# Patient Record
Sex: Female | Born: 1940 | Race: White | Hispanic: No | Marital: Married | Smoking: Former smoker
Health system: Southern US, Community
[De-identification: ages and names within clinical notes are randomized; demographics above are authoritative.]

## PROBLEM LIST (undated history)

## (undated) DIAGNOSIS — E46 Unspecified protein-calorie malnutrition: Secondary | ICD-10-CM

## (undated) DIAGNOSIS — K5790 Diverticulosis of intestine, part unspecified, without perforation or abscess without bleeding: Secondary | ICD-10-CM

## (undated) DIAGNOSIS — I219 Acute myocardial infarction, unspecified: Secondary | ICD-10-CM

## (undated) DIAGNOSIS — G459 Transient cerebral ischemic attack, unspecified: Secondary | ICD-10-CM

## (undated) DIAGNOSIS — G43909 Migraine, unspecified, not intractable, without status migrainosus: Secondary | ICD-10-CM

## (undated) DIAGNOSIS — F419 Anxiety disorder, unspecified: Secondary | ICD-10-CM

## (undated) DIAGNOSIS — I779 Disorder of arteries and arterioles, unspecified: Secondary | ICD-10-CM

## (undated) DIAGNOSIS — K08109 Complete loss of teeth, unspecified cause, unspecified class: Secondary | ICD-10-CM

## (undated) DIAGNOSIS — M797 Fibromyalgia: Secondary | ICD-10-CM

## (undated) DIAGNOSIS — R918 Other nonspecific abnormal finding of lung field: Secondary | ICD-10-CM

## (undated) DIAGNOSIS — J45909 Unspecified asthma, uncomplicated: Secondary | ICD-10-CM

## (undated) DIAGNOSIS — I509 Heart failure, unspecified: Secondary | ICD-10-CM

## (undated) DIAGNOSIS — I82409 Acute embolism and thrombosis of unspecified deep veins of unspecified lower extremity: Secondary | ICD-10-CM

## (undated) DIAGNOSIS — I440 Atrioventricular block, first degree: Secondary | ICD-10-CM

## (undated) DIAGNOSIS — F418 Other specified anxiety disorders: Secondary | ICD-10-CM

## (undated) DIAGNOSIS — K5900 Constipation, unspecified: Secondary | ICD-10-CM

## (undated) DIAGNOSIS — I639 Cerebral infarction, unspecified: Secondary | ICD-10-CM

## (undated) DIAGNOSIS — Z9981 Dependence on supplemental oxygen: Secondary | ICD-10-CM

## (undated) DIAGNOSIS — I739 Peripheral vascular disease, unspecified: Secondary | ICD-10-CM

## (undated) DIAGNOSIS — Z972 Presence of dental prosthetic device (complete) (partial): Secondary | ICD-10-CM

## (undated) DIAGNOSIS — J449 Chronic obstructive pulmonary disease, unspecified: Secondary | ICD-10-CM

## (undated) HISTORY — DX: Fibromyalgia: M79.7

## (undated) HISTORY — DX: Other specified anxiety disorders: F41.8

## (undated) HISTORY — PX: TONSILLECTOMY: SUR1361

## (undated) HISTORY — DX: Constipation, unspecified: K59.00

## (undated) HISTORY — PX: EYE SURGERY: SHX253

## (undated) HISTORY — PX: BACK SURGERY: SHX140

## (undated) HISTORY — DX: Cerebral infarction, unspecified: I63.9

## (undated) HISTORY — DX: Anxiety disorder, unspecified: F41.9

## (undated) HISTORY — DX: Acute embolism and thrombosis of unspecified deep veins of unspecified lower extremity: I82.409

## (undated) HISTORY — DX: Chronic obstructive pulmonary disease, unspecified: J44.9

## (undated) HISTORY — PX: OTHER SURGICAL HISTORY: SHX169

## (undated) HISTORY — PX: APPENDECTOMY: SHX54

---

## 1969-09-01 HISTORY — PX: ABDOMINAL HYSTERECTOMY: SHX81

## 2001-01-30 HISTORY — PX: COLONOSCOPY: SHX174

## 2012-06-11 ENCOUNTER — Encounter (HOSPITAL_COMMUNITY): Payer: Self-pay | Admitting: *Deleted

## 2012-06-11 ENCOUNTER — Emergency Department (HOSPITAL_COMMUNITY)
Admission: EM | Admit: 2012-06-11 | Discharge: 2012-06-11 | Disposition: A | Discharge status: Home / Self Care | Payer: Medicare Other | Attending: Emergency Medicine | Admitting: Emergency Medicine

## 2012-06-11 DIAGNOSIS — M25579 Pain in unspecified ankle and joints of unspecified foot: Secondary | ICD-10-CM | POA: Insufficient documentation

## 2012-06-11 DIAGNOSIS — Z888 Allergy status to other drugs, medicaments and biological substances status: Secondary | ICD-10-CM | POA: Insufficient documentation

## 2012-06-11 DIAGNOSIS — M79673 Pain in unspecified foot: Secondary | ICD-10-CM

## 2012-06-11 DIAGNOSIS — F17200 Nicotine dependence, unspecified, uncomplicated: Secondary | ICD-10-CM | POA: Insufficient documentation

## 2012-06-11 MED ORDER — HYDROCODONE-ACETAMINOPHEN 5-325 MG PO TABS
2.0000 | ORAL_TABLET | Freq: Once | ORAL | Status: AC
  Administered 2012-06-11: 2 via ORAL
  Filled 2012-06-11: qty 2

## 2012-06-11 MED ORDER — ONDANSETRON HCL 4 MG PO TABS
4.0000 mg | ORAL_TABLET | Freq: Once | ORAL | Status: AC
  Administered 2012-06-11: 4 mg via ORAL
  Filled 2012-06-11: qty 1

## 2012-06-11 MED ORDER — DEXAMETHASONE 6 MG PO TABS: ORAL_TABLET | ORAL | Status: DC

## 2012-06-11 MED ORDER — DEXAMETHASONE SODIUM PHOSPHATE 4 MG/ML IJ SOLN
8.0000 mg | Freq: Once | INTRAMUSCULAR | Status: AC
  Administered 2012-06-11: 8 mg via INTRAMUSCULAR
  Filled 2012-06-11: qty 2

## 2012-06-11 MED ORDER — DOXYCYCLINE HYCLATE 100 MG PO TABS
100.0000 mg | ORAL_TABLET | Freq: Once | ORAL | Status: AC
  Administered 2012-06-11: 100 mg via ORAL
  Filled 2012-06-11: qty 1

## 2012-06-11 MED ORDER — DOXYCYCLINE HYCLATE 100 MG PO CAPS: 100.0000 mg | ORAL_CAPSULE | Freq: Two times a day (BID) | ORAL | Status: DC

## 2012-06-11 NOTE — ED Notes (Signed)
Pt alert & oriented x4. Patient given discharge instructions, paperwork & prescription(s). Patient instructed to stop at the registration desk to finish any additional paperwork. Patient verbalized understanding. Pt left department w/ no further questions. 

## 2012-06-11 NOTE — ED Provider Notes (Signed)
History     CSN: [id number]  Arrival date & time 06/11/12  Rickey Primus   First MD Initiated Contact with Patient 06/11/12 1904      Chief Complaint  Patient presents with  . Ankle Pain    (Consider location/radiation/quality/duration/timing/severity/associated sxs/prior treatment) HPI Comments: Patient states she was working in her basement on last ev and put her feet on the floor, she noted severe pain in the ening. States she did not notice anything abnormal she was wearing shoes. This morning when she put her feet on the floor she noted pain in the right heel area. She then noticed a area of increased redness present. And has been concerned during the day as to whether or not she may have a spider bite. The patient has not had any fever. There is no red streaking that she has noted of the red area of the right heel. There's been no previous injury. She has no problem moving her ankle up and down with the exception of back of it causes pain. She's not had any pain of the toes. And she has not had this problem in the past.  The history is provided by the patient.    History reviewed. No pertinent past medical history.  Past Surgical History  Procedure Date  . Abdominal hysterectomy     History reviewed. No pertinent family history.  History  Substance Use Topics  . Smoking status: Current Every Day Smoker  . Smokeless tobacco: Not on file  . Alcohol Use: No    OB History    Grav Para Term Preterm Abortions TAB SAB Ect Mult Living                  Review of Systems  Constitutional: Negative for activity change.       All ROS Neg except as noted in HPI  HENT: Negative for nosebleeds and neck pain.   Eyes: Negative for photophobia and discharge.  Respiratory: Negative for cough, shortness of breath and wheezing.   Cardiovascular: Negative for chest pain and palpitations.  Gastrointestinal: Negative for abdominal pain and blood in stool.  Genitourinary: Negative for dysuria,  frequency and hematuria.  Musculoskeletal: Negative for back pain and arthralgias.  Skin: Negative.   Neurological: Negative for dizziness, seizures and speech difficulty.  Psychiatric/Behavioral: Negative for hallucinations and confusion.    Allergies  Codeine and Talwin  Home Medications   Current Outpatient Rx  Name Route Sig Dispense Refill  . DEXAMETHASONE 6 MG PO TABS  1 po daily with food 5 tablet 0  . DOXYCYCLINE HYCLATE 100 MG PO CAPS Oral Take 1 capsule (100 mg total) by mouth 2 (two) times daily. 14 capsule 0    BP 141/69  Pulse 84  Temp 97.8 F (36.6 C) (Oral)  Resp 20  Ht 5' 3.5" (1.613 m)  Wt 103 lb (46.72 kg)  BMI 17.96 kg/m2  SpO2 95%  Physical Exam  Nursing note and vitals reviewed. Constitutional: She is oriented to person, place, and time. She appears well-developed and well-nourished.  Non-toxic appearance.  HENT:  Head: Normocephalic.  Right Ear: Tympanic membrane and external ear normal.  Left Ear: Tympanic membrane and external ear normal.  Eyes: EOM and lids are normal. Pupils are equal, round, and reactive to light.  Neck: Normal range of motion. Neck supple. Carotid bruit is not present.  Cardiovascular: Normal rate, regular rhythm, normal heart sounds, intact distal pulses and normal pulses.   Pulmonary/Chest: No respiratory distress.  Soft wheezes present.  Abdominal: Soft. Bowel sounds are normal. There is no tenderness. There is no guarding.  Musculoskeletal: Normal range of motion.       There is pain and slight increased redness of the lateral posterior right ankle extending into the heel. There is also a small area of bruising just in front of the red area. The Achilles tendon is intact. The dorsalis pedis pulses 2+ on the right. There no puncture wounds noted at the red area of the heel under magnification. There no lesions in between the toes. There is no puncture wounds to the plantar surface. The right foot is not hot. There is full  range of motion of the right knee and hip.  Lymphadenopathy:       Head (right side): No submandibular adenopathy present.       Head (left side): No submandibular adenopathy present.    She has no cervical adenopathy.  Neurological: She is alert and oriented to person, place, and time. She has normal strength. No cranial nerve deficit or sensory deficit.  Skin: Skin is warm and dry.  Psychiatric: She has a normal mood and affect. Her speech is normal.    ED Course  Procedures (including critical care time)  Labs Reviewed - No data to display No results found. Pulse oximetry 95% on room air. Within normal limits by my interpretation.  1. Foot pain       MDM  I have reviewed nursing notes, vital signs, and all appropriate lab and imaging results for this patient. Patient seen with me by Dr. Lynelle Doctor.  The patient has a red tender area at the posterior portion of the right ankle. I do not see puncture marks nor evidence of stinger or body parts of an insect in the area. The area is not hot. The Achilles tendon is intact. Question ruptured varicose vein versus occult strain versus early cellulitis. The plan at this time is for the patient to receive Decadron 6 mg daily. The patient has hydrocodone at home that she will use for pain. The patient will apply ice and elevate the ankle. We'll also cover with doxycycline 100 mg 2 times daily for possible infection or early cellulitis. The patient is encouraged to return to the emergency department or see her primary physician if not improving.       Kathie Dike, [place] 06/11/12 2013

## 2012-06-11 NOTE — ED Notes (Addendum)
Pain medial aspect rt ankle, thinks was bitten by a spider. Sl redness present.painful wt bearing.lNo known injury

## 2012-06-11 NOTE — ED Provider Notes (Signed)
Patient has a mild diffuse swollen area inferior to her medial malleolus of her right ankle with a lot of varicosities in the area and minimal redness. There is no warmth to the area. The area is very tender to touch. There is no obvious insect bite areas seen.  Medical screening examination/treatment/procedure(s) were conducted as a shared visit with non-physician practitioner(s) and myself.  I personally evaluated the patient during the encounter Devoria Albe, MD, Franz Dell, MD 06/11/12 2008

## 2012-06-12 NOTE — ED Provider Notes (Signed)
See prior note   Ward Givens, MD 06/12/12 [phone]

## 2012-10-01 ENCOUNTER — Other Ambulatory Visit: Payer: Self-pay | Admitting: Family Medicine

## 2012-10-01 DIAGNOSIS — N644 Mastodynia: Secondary | ICD-10-CM

## 2012-10-07 ENCOUNTER — Telehealth: Payer: Self-pay | Admitting: *Deleted

## 2012-10-07 NOTE — Telephone Encounter (Signed)
Lindsay Mitchell called today to be triaged for a colonoscopy. Please call her back. Thank you.

## 2012-10-07 NOTE — Telephone Encounter (Signed)
I called pt to triage for colonoscopy. She had her last attempt in 2002 elsewhere. Many problems with prep and procedure. Said she had a difficult colon. OV with Gerrit Halls, NP on 10/28/2012 at 2:00 PM.

## 2012-10-19 ENCOUNTER — Other Ambulatory Visit (HOSPITAL_COMMUNITY): Payer: Self-pay | Admitting: Family Medicine

## 2012-10-19 DIAGNOSIS — R1013 Epigastric pain: Secondary | ICD-10-CM

## 2012-10-20 ENCOUNTER — Other Ambulatory Visit: Payer: Self-pay | Admitting: Family Medicine

## 2012-10-20 ENCOUNTER — Ambulatory Visit (HOSPITAL_COMMUNITY)
Admission: RE | Admit: 2012-10-20 | Discharge: 2012-10-20 | Disposition: A | Discharge status: Home / Self Care | Payer: Medicare Other | Source: Ambulatory Visit | Attending: Family Medicine | Admitting: Family Medicine

## 2012-10-20 DIAGNOSIS — R634 Abnormal weight loss: Secondary | ICD-10-CM | POA: Insufficient documentation

## 2012-10-20 DIAGNOSIS — R1013 Epigastric pain: Secondary | ICD-10-CM | POA: Insufficient documentation

## 2012-10-20 DIAGNOSIS — Q619 Cystic kidney disease, unspecified: Secondary | ICD-10-CM | POA: Insufficient documentation

## 2012-10-20 DIAGNOSIS — N644 Mastodynia: Secondary | ICD-10-CM

## 2012-10-20 DIAGNOSIS — R921 Mammographic calcification found on diagnostic imaging of breast: Secondary | ICD-10-CM

## 2012-10-20 MED ORDER — IOHEXOL 300 MG/ML  SOLN
100.0000 mL | Freq: Once | INTRAMUSCULAR | Status: AC | PRN
  Administered 2012-10-20: 100 mL via INTRAVENOUS

## 2012-10-27 ENCOUNTER — Ambulatory Visit
Admission: RE | Admit: 2012-10-27 | Discharge: 2012-10-27 | Disposition: A | Discharge status: Home / Self Care | Payer: Medicare Other | Source: Ambulatory Visit | Attending: Family Medicine | Admitting: Family Medicine

## 2012-10-27 ENCOUNTER — Other Ambulatory Visit: Payer: Self-pay | Admitting: Family Medicine

## 2012-10-27 DIAGNOSIS — C50912 Malignant neoplasm of unspecified site of left female breast: Secondary | ICD-10-CM

## 2012-10-27 DIAGNOSIS — R921 Mammographic calcification found on diagnostic imaging of breast: Secondary | ICD-10-CM

## 2012-10-28 ENCOUNTER — Encounter (HOSPITAL_COMMUNITY): Payer: Self-pay | Admitting: *Deleted

## 2012-10-28 ENCOUNTER — Encounter: Payer: Self-pay | Admitting: Gastroenterology

## 2012-10-28 ENCOUNTER — Ambulatory Visit (INDEPENDENT_AMBULATORY_CARE_PROVIDER_SITE_OTHER): Payer: Medicare Other | Admitting: Gastroenterology

## 2012-10-28 VITALS — BP 127/65 | HR 69 | Temp 97.0°F | Ht 63.0 in | Wt 102.4 lb

## 2012-10-28 DIAGNOSIS — R110 Nausea: Secondary | ICD-10-CM | POA: Insufficient documentation

## 2012-10-28 DIAGNOSIS — R109 Unspecified abdominal pain: Secondary | ICD-10-CM | POA: Insufficient documentation

## 2012-10-28 DIAGNOSIS — K5900 Constipation, unspecified: Secondary | ICD-10-CM | POA: Insufficient documentation

## 2012-10-28 DIAGNOSIS — R634 Abnormal weight loss: Secondary | ICD-10-CM

## 2012-10-28 MED ORDER — FLEET ENEMA 7-19 GM/118ML RE ENEM
1.0000 | ENEMA | Freq: Once | RECTAL | Status: DC
Start: 2012-10-28 — End: 2012-10-29

## 2012-10-28 MED ORDER — LUBIPROSTONE 24 MCG PO CAPS: 24.0000 ug | ORAL_CAPSULE | Freq: Two times a day (BID) | ORAL | Status: DC

## 2012-10-28 MED ORDER — PEG 3350-KCL-NA BICARB-NACL 420 G PO SOLR: 4000.0000 mL | ORAL | Status: DC

## 2012-10-28 NOTE — Patient Instructions (Addendum)
Start taking Amitiza (a constipation medication) once a day WITH FOOD to avoid nausea. Increase this to twice a day if you do well with this.  We have set you up for a colonoscopy and upper endoscopy with Dr. Darrick Penna in the near future.

## 2012-10-28 NOTE — ED Notes (Addendum)
Normal bm 3 days ago.  To have colonoscopy,March 11.  abd pain, nausea.  Pt seen by GI doctor today.  Pt thinks she is impacted.

## 2012-10-28 NOTE — Progress Notes (Signed)
Primary Care Physician:  Redmond Baseman, MD Primary Gastroenterologist:  Dr. Darrick Penna   Chief Complaint  Patient presents with  . Colonoscopy    HPI:   Ms. Lindsay Mitchell is a pleasant 72 year old female who presents today for an updated colonoscopy. Apparently, her last lower GI evaluation was in 2002 by Dr. Noe Gens at [facility]. Apparently, she had severe diverticular disease with haustal hypertrophy, persistent spasticity and early stenosis. Doesn't want the colonoscopy to be completed "with any force". History of chronic constipation, with BM usually every other day, sometimes every 3 days. Stool softener. Weighed in the 130s about 7-8 months ago, now right at 100 lbs. Lower abdominal discomfort when "bowels don't move". States chronically tender. +lack of appetite, intermittent nausea a few weeks ago. +stress. No GERD. No dysphagia. Scant hematochezia with straining. No FH of colon cancer.    CT Feb 2014 without evidence for occult malignancy, diverticulitis. Recent CBC, TSH, HFP all normal.  Past Medical History  Diagnosis Date  . Constipation   . Depression with anxiety     Past Surgical History  Procedure Laterality Date  . Abdominal hysterectomy    . Back surgery      X3  . Colonoscopy  June 2002    Dr. Noe Gens: severe diverticular disease with haustral hypertrophy, primary and secondary diverticulosis, persistent spasticity, early stenosis    Current Outpatient Prescriptions  Medication Sig Dispense Refill  . clonazePAM (KLONOPIN) 1 MG tablet Take 1 mg by mouth 2 (two) times daily as needed for anxiety.      Marland Kitchen HYDROcodone-acetaminophen (NORCO/VICODIN) 5-325 MG per tablet Take 1 tablet by mouth every 6 (six) hours as needed for pain.      Marland Kitchen dexamethasone (DECADRON) 6 MG tablet 1 po daily with food  5 tablet  0  . doxycycline (VIBRAMYCIN) 100 MG capsule Take 1 capsule (100 mg total) by mouth 2 (two) times daily.  14 capsule  0  . lubiprostone (AMITIZA) 24 MCG  capsule Take 1 capsule (24 mcg total) by mouth 2 (two) times daily with a meal.  60 capsule  3  . polyethylene glycol-electrolytes (TRILYTE) 420 G solution Take 4,000 mLs by mouth as directed.  4000 mL  0   No current facility-administered medications for this visit.    Allergies as of 10/28/2012 - Review Complete 10/28/2012  Allergen Reaction Noted  . Codeine  06/11/2012  . Talwin (pentazocine)  06/11/2012    Family History  Problem Relation Age of Onset  . Colon cancer Neg Hx     History   Social History  . Marital Status: Divorced    Spouse Name: N/A    Number of Children: 4  . Years of Education: N/A   Occupational History  . Not on file.   Social History Main Topics  . Smoking status: Former Smoker    Quit date: 09/01/2012  . Smokeless tobacco: Not on file     Comment: smoked X 60 years  . Alcohol Use: No  . Drug Use: No  . Sexually Active: Yes    Birth Control/ Protection: Surgical   Other Topics Concern  . Not on file   Social History Narrative  . No narrative on file    Review of Systems: Gen: SEE HPI CV: Denies chest pain, heart palpitations, peripheral edema, syncope.  Resp: Denies shortness of breath at rest or with exertion. Denies wheezing or cough.  GI: SEE HPI  GU : Denies urinary burning, urinary frequency, urinary hesitancy MS: +arthritis  Derm: Denies rash, itching, dry skin Psych: Denies depression, anxiety, memory loss, and confusion Heme: Denies bruising, bleeding, and enlarged lymph nodes.  Physical Exam: BP 127/65  Pulse 69  Temp(Src) 97 F (36.1 C) (Oral)  Ht 5\' 3"  (1.6 m)  Wt 102 lb 6.4 oz (46.448 kg)  BMI 18.14 kg/m2 General:   Alert and oriented. Pleasant and cooperative. Thin but not cachectic. Head:  Normocephalic and atraumatic. Eyes:  Without icterus, sclera clear and conjunctiva pink.  Ears:  Normal auditory acuity. Nose:  No deformity, discharge,  or lesions. Mouth:  No deformity or lesions, oral mucosa pink.  Neck:   Supple, without mass or thyromegaly. Lungs:  Clear to auscultation bilaterally. No wheezes, rales, or rhonchi. No distress.  Heart:  S1, S2 present without murmurs appreciated.  Abdomen:  +BS, soft, mildly tender to palpation lower abdomen and non-distended. No HSM noted. No guarding or rebound. No masses appreciated.  Rectal:  Deferred  Msk:  Symmetrical without gross deformities. Normal posture. Extremities:  Without clubbing or edema. Neurologic:  Alert and  oriented x4;  grossly normal neurologically. Skin:  Intact without significant lesions or rashes. Cervical Nodes:  No significant cervical adenopathy. Psych:  Alert and cooperative. Normal mood and affect.

## 2012-10-28 NOTE — Assessment & Plan Note (Signed)
Approximately 30 lbs lost in last half of the year. Pt admits to stress; however, she also notes early satiety and intermittent nausea. Unable to exclude gastritis, PUD, occult biliary component. CT on file negative for malignancy. Wt loss may be secondary to anxiety.   Proceed with upper endoscopy at time of TCS in the near future with Dr. Darrick Penna. The risks, benefits, and alternatives have been discussed in detail with patient. They have stated understanding and desire to proceed.  PROPOFOL

## 2012-10-28 NOTE — Assessment & Plan Note (Addendum)
72 year old female with chronic constipation, on stool softeners prn. However, she notes lower abdominal discomfort that is chronic as well, with a negative CT on file from February of this year. 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 Scant hematochezia noted in past. Last colonoscopy in 2002 at [facility], which appears to have been quite difficult due to significant diverticular disease, spasticity, early stenosis. She is quite concerned regarding a colonoscopy and worried about perforation. I assured her that force would not be used and extra precautions taken. She will be done with Propofol due to her concern for failed sedation.  Proceed with colonoscopy with Dr. Darrick Penna in the near future. The risks, benefits, and alternatives have been discussed in detail with the patient. They state understanding and desire to proceed.  PROPOFOL for sedation Begin Amitiza 24 mcg daily, increase to BID

## 2012-10-28 NOTE — Progress Notes (Signed)
Faxed to PCP

## 2012-10-29 ENCOUNTER — Emergency Department (HOSPITAL_COMMUNITY)
Admission: EM | Admit: 2012-10-29 | Discharge: 2012-10-29 | Discharge status: Against Medical Advice | Payer: Medicare Other | Attending: Emergency Medicine | Admitting: Emergency Medicine

## 2012-10-29 HISTORY — DX: Diverticulosis of intestine, part unspecified, without perforation or abscess without bleeding: K57.90

## 2012-10-29 NOTE — ED Notes (Signed)
Per registration pt left after triage.

## 2012-11-02 ENCOUNTER — Encounter (HOSPITAL_COMMUNITY): Payer: Self-pay | Admitting: Pharmacy Technician

## 2012-11-02 NOTE — Patient Instructions (Addendum)
Pihu Basil  11/02/2012   Your procedure is scheduled on:  11/09/2012  Report to [facility] at  615  AM.  Call this number if you have problems the morning of surgery: [phone]   Remember:   Do not eat food or drink liquids after midnight.   Take these medicines the morning of surgery with A SIP OF WATER: decadron,klonopin,norco   Do not wear jewelry, make-up or nail polish.  Do not wear lotions, powders, or perfumes.   Do not shave 48 hours prior to surgery. Men may shave face and neck.  Do not bring valuables to the hospital.  Contacts, dentures or bridgework may not be worn into surgery.  Leave suitcase in the car. After surgery it may be brought to your room.  For patients admitted to the hospital, checkout time is 11:00 AM the day of discharge.   Patients discharged the day of surgery will not be allowed to drive  home.  Name and phone number of your driver: family  Special Instructions: Shower using CHG 2 nights before surgery and the night before surgery.  If you shower the day of surgery use CHG.  Use special wash - you have one bottle of CHG for all showers.  You should use approximately 1/3 of the bottle for each shower.   Please read over the following fact sheets that you were given: Pain Booklet, Coughing and Deep Breathing, MRSA Information, Surgical Site Infection Prevention, Anesthesia Post-op Instructions and Care and Recovery After Surgery Colonoscopy A colonoscopy is an exam to evaluate your entire colon. In this exam, your colon is cleansed. A long fiberoptic tube is inserted through your rectum and into your colon. The fiberoptic scope (endoscope) is a long bundle of enclosed and very flexible fibers. These fibers transmit light to the area examined and send images from that area to your caregiver. Discomfort is usually minimal. You may be given a drug to help you sleep (sedative) during or prior to the procedure. This exam helps to detect lumps (tumors),  polyps, inflammation, and areas of bleeding. Your caregiver may also take a small piece of tissue (biopsy) that will be examined under a microscope. LET YOUR CAREGIVER KNOW ABOUT:   Allergies to food or medicine.  Medicines taken, including vitamins, herbs, eyedrops, over-the-counter medicines, and creams.  Use of steroids (by mouth or creams).  Previous problems with anesthetics or numbing medicines.  History of bleeding problems or blood clots.  Previous surgery.  Other health problems, including diabetes and kidney problems.  Possibility of pregnancy, if this applies. BEFORE THE PROCEDURE   A clear liquid diet may be required for 2 days before the exam.  Ask your caregiver about changing or stopping your regular medications.  Liquid injections (enemas) or laxatives may be required.  A large amount of electrolyte solution may be given to you to drink over a short period of time. This solution is used to clean out your colon.  You should be present 60 minutes prior to your procedure or as directed by your caregiver. AFTER THE PROCEDURE   If you received a sedative or pain relieving medication, you will need to arrange for someone to drive you home.  Occasionally, there is a little blood passed with the first bowel movement. Do not be concerned. FINDING OUT THE RESULTS OF YOUR TEST Not all test results are available during your visit. If your test results are not back during the visit, make an appointment with  your caregiver to find out the results. Do not assume everything is normal if you have not heard from your caregiver or the medical facility. It is important for you to follow up on all of your test results. HOME CARE INSTRUCTIONS   It is not unusual to pass moderate amounts of gas and experience mild abdominal cramping following the procedure. This is due to air being used to inflate your colon during the exam. Walking or a warm pack on your belly (abdomen) may  help.  You may resume all normal meals and activities after sedatives and medicines have worn off.  Only take over-the-counter or prescription medicines for pain, discomfort, or fever as directed by your caregiver. Do not use aspirin or blood thinners if a biopsy was taken. Consult your caregiver for medicine usage if biopsies were taken. SEEK IMMEDIATE MEDICAL CARE IF:   You have a fever.  You pass large blood clots or fill a toilet with blood following the procedure. This may also occur 10 to 14 days following the procedure. This is more likely if a biopsy was taken.  You develop abdominal pain that keeps getting worse and cannot be relieved with medicine. Document Released: 08/15/2000 Document Revised: 11/10/2011 Document Reviewed: 03/30/2008 [facility] Patient Information 2013 [place], [place]. PATIENT INSTRUCTIONS POST-ANESTHESIA  IMMEDIATELY FOLLOWING SURGERY:  Do not drive or operate machinery for the first twenty four hours after surgery.  Do not make any important decisions for twenty four hours after surgery or while taking narcotic pain medications or sedatives.  If you develop intractable nausea and vomiting or a severe headache please notify your doctor immediately.  FOLLOW-UP:  Please make an appointment with your surgeon as instructed. You do not need to follow up with anesthesia unless specifically instructed to do so.  WOUND CARE INSTRUCTIONS (if applicable):  Keep a dry clean dressing on the anesthesia/puncture wound site if there is drainage.  Once the wound has quit draining you may leave it open to air.  Generally you should leave the bandage intact for twenty four hours unless there is drainage.  If the epidural site drains for more than 36-48 hours please call the anesthesia department.  QUESTIONS?:  Please feel free to call your physician or the hospital operator if you have any questions, and they will be happy to assist you.

## 2012-11-03 ENCOUNTER — Other Ambulatory Visit: Payer: Self-pay | Admitting: Family Medicine

## 2012-11-03 ENCOUNTER — Telehealth: Payer: Self-pay | Admitting: Gastroenterology

## 2012-11-03 ENCOUNTER — Encounter (HOSPITAL_COMMUNITY)
Admission: RE | Admit: 2012-11-03 | Discharge: 2012-11-03 | Disposition: A | Discharge status: Home / Self Care | Payer: Medicare Other | Source: Ambulatory Visit

## 2012-11-03 DIAGNOSIS — R4182 Altered mental status, unspecified: Secondary | ICD-10-CM

## 2012-11-03 NOTE — Telephone Encounter (Signed)
Kim called from Endo and stated that Lindsay Mitchell no showed for her Pre-Op Appointment and I called and LMOM for patient to return my call to get her R/S

## 2012-11-04 NOTE — Telephone Encounter (Signed)
Lindsay Mitchell called and stated that she has had a stroke and they are doing some testing on her brain for possible blood clot and she is going to put off her TCS right now due to more emergent medical problems and she will call back at a later date to R/S her TCS

## 2012-11-04 NOTE — Telephone Encounter (Signed)
Agree. That is definitely appropriate. Thanks for the info.

## 2012-11-05 ENCOUNTER — Inpatient Hospital Stay (HOSPITAL_COMMUNITY): Admission: RE | Admit: 2012-11-05 | Payer: Medicare Other | Source: Ambulatory Visit

## 2012-11-05 ENCOUNTER — Ambulatory Visit (HOSPITAL_COMMUNITY): Payer: Medicare Other

## 2012-11-05 ENCOUNTER — Ambulatory Visit (HOSPITAL_COMMUNITY): Admission: RE | Admit: 2012-11-05 | Payer: Medicare Other | Source: Ambulatory Visit

## 2012-11-08 ENCOUNTER — Ambulatory Visit (HOSPITAL_COMMUNITY)
Admission: RE | Admit: 2012-11-08 | Discharge: 2012-11-08 | Disposition: A | Discharge status: Home / Self Care | Payer: Medicare Other | Source: Ambulatory Visit | Attending: Family Medicine | Admitting: Family Medicine

## 2012-11-08 ENCOUNTER — Other Ambulatory Visit (HOSPITAL_COMMUNITY): Payer: Medicare Other

## 2012-11-08 DIAGNOSIS — R4182 Altered mental status, unspecified: Secondary | ICD-10-CM

## 2012-11-08 DIAGNOSIS — I6529 Occlusion and stenosis of unspecified carotid artery: Secondary | ICD-10-CM | POA: Insufficient documentation

## 2012-11-09 ENCOUNTER — Ambulatory Visit: Admit: 2012-11-09 | Payer: Medicare Other | Admitting: Gastroenterology

## 2012-11-09 ENCOUNTER — Other Ambulatory Visit (HOSPITAL_COMMUNITY): Payer: Medicare Other

## 2012-11-09 SURGERY — COLONOSCOPY WITH PROPOFOL
Anesthesia: Monitor Anesthesia Care

## 2012-11-12 ENCOUNTER — Encounter: Payer: Self-pay | Admitting: Gastroenterology

## 2012-11-15 ENCOUNTER — Other Ambulatory Visit: Payer: Self-pay

## 2012-11-15 ENCOUNTER — Encounter: Payer: Self-pay | Admitting: Vascular Surgery

## 2012-11-24 ENCOUNTER — Other Ambulatory Visit: Payer: Self-pay | Admitting: *Deleted

## 2012-12-09 ENCOUNTER — Encounter: Payer: Self-pay | Admitting: Vascular Surgery

## 2012-12-09 ENCOUNTER — Ambulatory Visit: Payer: Self-pay | Admitting: Family Medicine

## 2012-12-10 ENCOUNTER — Ambulatory Visit (INDEPENDENT_AMBULATORY_CARE_PROVIDER_SITE_OTHER): Payer: Medicare Other | Admitting: Vascular Surgery

## 2012-12-10 ENCOUNTER — Encounter: Payer: Medicare Other | Admitting: Vascular Surgery

## 2012-12-10 ENCOUNTER — Encounter: Payer: Self-pay | Admitting: Vascular Surgery

## 2012-12-10 ENCOUNTER — Other Ambulatory Visit (INDEPENDENT_AMBULATORY_CARE_PROVIDER_SITE_OTHER): Payer: Medicare Other | Admitting: *Deleted

## 2012-12-10 ENCOUNTER — Other Ambulatory Visit: Payer: Medicare Other

## 2012-12-10 VITALS — BP 121/70 | HR 75 | Ht 63.0 in | Wt 105.3 lb

## 2012-12-10 DIAGNOSIS — I6522 Occlusion and stenosis of left carotid artery: Secondary | ICD-10-CM

## 2012-12-10 DIAGNOSIS — I6529 Occlusion and stenosis of unspecified carotid artery: Secondary | ICD-10-CM

## 2012-12-10 NOTE — Progress Notes (Signed)
[facility]  New Carotid Patient  Referred by:  Ileana Ladd, MD [address] [place], [place] [address]  Reason for referral: L carotid stenosis  History of Present Illness  Lindsay Mitchell is a 72 y.o. (1940/12/31) female who presents with chief complaint: possible L neck blockage.  Previous carotid studies demonstrated: RICA minimal stenosis, LICA >70% stenosis.  Pt recently had an episode where she would noted by her husban to be slurring her speech.  The patient went to bed and it resolved by the time she woke.  She is amnestic of the entire event.  The patient was sent to ER for CVA work-up.  Head MRI was negative.  Carotid studies were as noted above.  The patient has never had amaurosis fugax or monocular blindness.  The patient has never had facial drooping or hemiplegia.  The patient has had an episode of expressive aphasia.   The patient's previous neurologic deficits have resolved.  The patient's risks factors for carotid disease include: former smoker.  Past Medical History  Diagnosis Date  . Constipation   . Depression with anxiety   . Diverticulosis   . DVT (deep venous thrombosis)   . COPD (chronic obstructive pulmonary disease)   . Carotid artery occlusion     Past Surgical History  Procedure Laterality Date  . Abdominal hysterectomy    . Back surgery      X3  . Colonoscopy  June 2002    Dr. Noe Gens: severe diverticular disease with haustral hypertrophy, primary and secondary diverticulosis, persistent spasticity, early stenosis    History   Social History  . Marital Status: Divorced    Spouse Name: N/A    Number of Children: 4  . Years of Education: N/A   Occupational History  . Not on file.   Social History Main Topics  . Smoking status: Former Smoker    Quit date: 06/11/2012  . Smokeless tobacco: Not on file     Comment: smoked X 60 years  . Alcohol Use: No  . Drug Use: No  . Sexually Active: Yes    Birth  Control/ Protection: Surgical   Other Topics Concern  . Not on file   Social History Narrative  . No narrative on file    Family History  Problem Relation Age of Onset  . Colon cancer Neg Hx   . Cancer Mother   . Heart disease Mother   . Hypertension Mother   . Heart attack Mother   . Other Mother     varicose veins  . Diabetes Brother   . Hypertension Brother     Current Outpatient Prescriptions on File Prior to Visit  Medication Sig Dispense Refill  . clonazePAM (KLONOPIN) 1 MG tablet Take 1 mg by mouth 2 (two) times daily as needed for anxiety.      Marland Kitchen dexamethasone (DECADRON) 6 MG tablet 1 po daily with food  5 tablet  0  . doxycycline (VIBRAMYCIN) 100 MG capsule Take 1 capsule (100 mg total) by mouth 2 (two) times daily.  14 capsule  0  . HYDROcodone-acetaminophen (NORCO/VICODIN) 5-325 MG per tablet Take 1 tablet by mouth every 6 (six) hours as needed for pain.      Marland Kitchen lubiprostone (AMITIZA) 24 MCG capsule Take 1 capsule (24 mcg total) by mouth 2 (two) times daily with a meal.  60 capsule  3  . polyethylene glycol-electrolytes (TRILYTE) 420 G solution Take 4,000 mLs by mouth as directed.  4000 mL  0   No current facility-administered medications on file prior to visit.    Allergies  Allergen Reactions  . Codeine   . Talwin (Pentazocine)   . Valium (Diazepam)   . Vicodin (Hydrocodone-Acetaminophen)     REVIEW OF SYSTEMS:  (Positives checked otherwise negative)  CARDIOVASCULAR:  []  chest pain, []  chest pressure, []  palpitations, []  shortness of breath when laying flat, [x]  shortness of breath with exertion,  [x]  pain in feet when walking, [x]  pain in feet when laying flat, []  history of blood clot in veins (DVT), []  history of phlebitis, []  swelling in legs, [x]  varicose veins  PULMONARY:  []  productive cough, []  asthma, []  wheezing  NEUROLOGIC:  []  weakness in arms or legs, []  numbness in arms or legs, []  difficulty speaking or slurred speech, []  temporary loss of  vision in one eye, []  dizziness  HEMATOLOGIC:  []  bleeding problems, [x]  problems with blood clotting too easily  MUSCULOSKEL:  []  joint pain, []  joint swelling  GASTROINTEST:  []  vomiting blood, []  blood in stool     GENITOURINARY:  []  burning with urination, []  blood in urine  PSYCHIATRIC:  []  history of major depression  INTEGUMENTARY:  []  rashes, []  ulcers  CONSTITUTIONAL:  []  fever, []  chills  PRE-ADM LIVING: []  Home, []  Nursing home, []  Homeless  AMB STATUS: []  Walking, []  Walking w/ Assistance, []  Wheelchair, [] Bed ridden  For [employer] Use RECENT HEART ATTACK (<6 mon): No  CAD Sx: [x]  No, []  Asymptomatic, h/o heart attack, []  Stable angina, []  Unstable angina  PRIOR CHF: [x]  No, []  Asymptomatic , []  Mild, []  Moderate, []  Severe  STRESS TEST: [x]  No, []  Normal, []  + ischemia, []  + Heart attack, []  Both  Physical Examination  Filed Vitals:   12/10/12 1333 12/10/12 1335  BP: 133/72 121/70  Pulse: 75   Height: 5\' 3"  (1.6 m)   Weight: 105 lb 4.8 oz (47.764 kg)   SpO2: 98%     Body mass index is 18.66 kg/(m^2).  General: A&O x 3, WD, thin  Head: Deport/AT  Ear/Nose/Throat: Hearing grossly intact, nares w/o erythema or drainage, oropharynx w/o Erythema/Exudate, Mallampati score: 2  Eyes: PERRLA, EOMI  Neck: Supple, no nuchal rigidity, no palpable LAD  Pulmonary: Sym exp, good air movt, CTAB, no rales, rhonchi, & wheezing  Cardiac: RRR, Nl S1, S2, no Murmurs, rubs or gallops  Vascular: Vessel Right Left  Radial Palpable Palpable  Ulnar Palpable Palpable  Brachial Palpable Palpable  Carotid Palpable, without bruit Palpable, without bruit  Aorta Not palpable N/A  Femoral Palpable Palpable  Popliteal Not palpable Not palpable  PT  Palpable  Palpable  DP  Palpable  Palpable   Gastrointestinal: soft, NTND, -G/R, - HSM, - masses, - CVAT B  Musculoskeletal: M/S 5/5 throughout , Extremities without ischemic changes   Neurologic: CN 2-12 intact , Pain and light  touch intact in extremities , Motor exam as listed above  Psychiatric: Judgment intact, Mood & affect appropriate for pt's clinical situation  Dermatologic: See M/S exam for extremity exam, no rashes otherwise noted  Lymph : No Cervical, Axillary, or Inguinal lymphadenopathy   Non-Invasive Vascular Imaging  L CAROTID DUPLEX (Date: 12/10/12):   L ICA stenosis: 50-69%  L VA: patent and antegrade  Outside Studies/Documentation 4 pages of outside documents were reviewed including: outside MRI report and outside carotid duplex.  Medical Decision Making  Niemah Schwebke is a 72 y.o. female who presents with: likely asx L ICA stenosis  50-69%   Based on the patient's vascular studies and examination, I have offered the patient: refer to Neurology.  I am not absolutely convinced that this patient had a CVA and the head MRI doesn't demonstrate any infarcted tissue.  It is possible she had a TIA, but TIA don't cause amnesia.  I discussed in depth with the patient the nature of atherosclerosis, and emphasized the importance of maximal medical management including strict control of blood pressure, blood glucose, and lipid levels, obtaining regular exercise, antiplatelet agents, and cessation of smoking.    The patient is currently not on an antiplatelet.  I asked her to take ASA 81 mg PO daily.  The patient is currently not on a statin.  The patient prefers follow up with her PCP in regards starting an statin.  The patient is aware that without maximal medical management the underlying atherosclerotic disease process will progress, limiting the benefit of any interventions.  The patient will follow up with [place] after Neurology evaluation.  If they feel she had a TIA, [facility] would support proceeding with a L CEA.  Thank you for allowing [place] to participate in this patient's care.  Leonides Sake, MD [facility] Office: [phone] Pager: [phone]  12/10/2012,  4:42 PM

## 2012-12-20 ENCOUNTER — Telehealth: Payer: Self-pay | Admitting: Vascular Surgery

## 2012-12-20 NOTE — Telephone Encounter (Signed)
Dr Imogene Burn would like for this patient to be evaluated by a neurologist ASAP for possible TIA/CVA symptoms. Originally Dr Imogene Burn had asked that we find a neurologist in [place] for the patients convenience. I referred Lindsay Mitchell to Dr Gerilyn Pilgrim in [place].   Lindsay Mitchell called on 04/16 to let me know that she had decided against seeing Dr Gerilyn Pilgrim. She said that Dr Imogene Burn had mentioned in their conversation possibly referring her to [facility] or [facility]. She has decided that she trusts Dr Imogene Burn enough to make the trip to see a neurologist that he recommends. I have since referred her to [facility] Neurology.  Their office will contact her with an appointment. I have cancelled her referral to [facility] and she is aware that [facility] will contact her with an appointment, dpm

## 2012-12-29 ENCOUNTER — Telehealth: Payer: Self-pay | Admitting: Vascular Surgery

## 2012-12-29 NOTE — Telephone Encounter (Signed)
Spoke with patient regarding referral made to [facility] Neurology. Patient is aware of her appointment on 01/13/13 @ 9:00am.

## 2013-01-06 ENCOUNTER — Telehealth: Payer: Self-pay | Admitting: Family Medicine

## 2013-01-06 NOTE — Telephone Encounter (Signed)
Needs office visit. We do have a diagnosis of COPD but no record of treating her with these medicines.

## 2013-01-07 NOTE — Telephone Encounter (Signed)
Spoke with pt needs refill on inhalers  appt Monday and advised to bring all meds

## 2013-01-10 ENCOUNTER — Encounter: Payer: Self-pay | Admitting: Family Medicine

## 2013-01-10 ENCOUNTER — Ambulatory Visit (INDEPENDENT_AMBULATORY_CARE_PROVIDER_SITE_OTHER): Payer: Medicare Other | Admitting: Family Medicine

## 2013-01-10 VITALS — BP 105/67 | HR 67 | Temp 97.0°F | Wt 104.2 lb

## 2013-01-10 DIAGNOSIS — I6529 Occlusion and stenosis of unspecified carotid artery: Secondary | ICD-10-CM

## 2013-01-10 DIAGNOSIS — M549 Dorsalgia, unspecified: Secondary | ICD-10-CM

## 2013-01-10 DIAGNOSIS — J441 Chronic obstructive pulmonary disease with (acute) exacerbation: Secondary | ICD-10-CM

## 2013-01-10 MED ORDER — HYDROCODONE-ACETAMINOPHEN 5-325 MG PO TABS: 1.0000 | ORAL_TABLET | Freq: Three times a day (TID) | ORAL | Status: DC | PRN

## 2013-01-10 MED ORDER — BECLOMETHASONE DIPROPIONATE 40 MCG/ACT IN AERS: 2.0000 | INHALATION_SPRAY | Freq: Two times a day (BID) | RESPIRATORY_TRACT | Status: DC

## 2013-01-10 MED ORDER — MIRTAZAPINE 15 MG PO TABS: 15.0000 mg | ORAL_TABLET | Freq: Every day | ORAL | Status: DC

## 2013-01-10 MED ORDER — IPRATROPIUM-ALBUTEROL 0.5-2.5 (3) MG/3ML IN SOLN: 3.0000 mL | Freq: Four times a day (QID) | RESPIRATORY_TRACT | Status: DC | PRN

## 2013-01-10 NOTE — Progress Notes (Signed)
Patient ID: Lindsay Mitchell, female   DOB: 12-18-1940, 72 y.o.   MRN: [id number] SUBJECTIVE: HPI: Came for follow up.Also, needs refills. Has COPD.stable at present. Not had an exacerbation. has had shortness of breath has not had wheezing has had a response to medications Medications used:spiriva and duoneb. has not had swelling of the feet has not had chest pain has not had exposure tobacco smoke or other triggers. has had a flu shot has not had a pneumonia shot has not had fever has not had purulent phlegm has not had blood in the sputum, has had weight loss.but regained after extensive work up.  has not had any recent medication changes.    PMH/PSH: reviewed/updated in Epic  SH/FH: reviewed/updated in Epic  Allergies: reviewed/updated in Epic  Medications: reviewed/updated in Epic  Immunizations: reviewed/updated in Epic  ROS: As above in the HPI. All other systems are stable or negative.  OBJECTIVE: APPEARANCE:  Patient in no acute distress.The patient appeared well nourished and normally developed. Acyanotic. Waist: VITAL SIGNS:BP 105/67  Pulse 67  Temp(Src) 97 F (36.1 C) (Oral)  Wt 104 lb 3.2 oz (47.265 kg)  BMI 18.46 kg/m2 WF slim built.  SKIN: warm and  Dry without overt rashes, tattoos and scars  HEAD and Neck: without JVD, Head and scalp: normal Eyes:No scleral icterus. Fundi normal, eye movements normal. Ears: Auricle normal, canal normal, Tympanic membranes normal, insufflation normal. Nose: normal Throat: normal Neck & thyroid: normal  CHEST & LUNGS: Chest wall: normal Lungs: Coarse breath sounds.  CVS: Reveals the PMI to be normally located. Regular rhythm, First and Second Heart sounds are normal,  absence of murmurs, rubs or gallops. Peripheral vasculature: Radial pulses: normal Dorsal pedis pulses: reduced Posterior pedis pulses:reduced Carotids:bruits+  ABDOMEN:  Appearance: normal Benign,, no organomegaly, no masses, no  Abdominal Aortic enlargement. No Guarding , no rebound. No Bruits. Bowel sounds: normal  RECTAL: N/A GU: N/A  EXTREMETIES: nonedematous. Pedal pulses is reduced.  MUSCULOSKELETAL:  Spine: decreased ROM and pain   NEUROLOGIC: oriented to time,place and person; nonfocal. Strength is normal  ASSESSMENT: Back pain - Plan: HYDROcodone-acetaminophen (NORCO/VICODIN) 5-325 MG per tablet, beclomethasone (QVAR) 40 MCG/ACT inhaler  Carotid stenosis, unspecified laterality - Plan: COMPLETE METABOLIC PANEL WITH GFR, NMR Lipoprofile with Lipids  COPD exacerbation - Plan: ipratropium-albuterol (DUONEB) 0.5-2.5 (3) MG/3ML SOLN   .PLAN:  Orders Placed This Encounter  Procedures  . COMPLETE METABOLIC PANEL WITH GFR  . NMR Lipoprofile with Lipids   No results found for this or any previous visit. Meds ordered this encounter  Medications  . DISCONTD: ipratropium-albuterol (DUONEB) 0.5-2.5 (3) MG/3ML SOLN    Sig: Take 3 mLs by nebulization every 4 (four) hours as needed.  Marland Kitchen DISCONTD: tiotropium (SPIRIVA) 18 MCG inhalation capsule    Sig: Place 18 mcg into inhaler and inhale daily.  Marland Kitchen HYDROcodone-acetaminophen (NORCO/VICODIN) 5-325 MG per tablet    Sig: Take 1 tablet by mouth every 8 (eight) hours as needed for pain.    Dispense:  30 tablet    Refill:  0  . ipratropium-albuterol (DUONEB) 0.5-2.5 (3) MG/3ML SOLN    Sig: Take 3 mLs by nebulization every 6 (six) hours as needed.    Dispense:  360 mL    Refill:  3  . beclomethasone (QVAR) 40 MCG/ACT inhaler    Sig: Inhale 2 puffs into the lungs 2 (two) times daily.    Dispense:  1 Inhaler    Refill:  5  . mirtazapine (REMERON) 15 MG tablet  Sig: Take 1 tablet (15 mg total) by mouth daily.    Dispense:  30 tablet    Refill:  5   Discussed risks and  Benefits of medications. Probably needs to be on a statin. meds adjusted.  RTc 3 months.  Malacki Mcphearson P. Modesto Charon, M.D.

## 2013-01-11 LAB — COMPLETE METABOLIC PANEL WITH GFR
ALT: 8 U/L (ref 0–35)
AST: 16 U/L (ref 0–37)
Albumin: 4.2 g/dL (ref 3.5–5.2)
Alkaline Phosphatase: 89 U/L (ref 39–117)
BUN: 31 mg/dL — ABNORMAL HIGH (ref 6–23)
CO2: 28 mEq/L (ref 19–32)
Calcium: 9.6 mg/dL (ref 8.4–10.5)
Chloride: 104 mEq/L (ref 96–112)
Creat: 1.05 mg/dL (ref 0.50–1.10)
GFR, Est African American: 63 mL/min
GFR, Est Non African American: 54 mL/min — ABNORMAL LOW
Glucose, Bld: 91 mg/dL (ref 70–99)
Potassium: 4.1 mEq/L (ref 3.5–5.3)
Sodium: 142 mEq/L (ref 135–145)
Total Bilirubin: 0.3 mg/dL (ref 0.3–1.2)
Total Protein: 6.3 g/dL (ref 6.0–8.3)

## 2013-01-13 ENCOUNTER — Other Ambulatory Visit: Payer: Self-pay | Admitting: Family Medicine

## 2013-01-13 DIAGNOSIS — E785 Hyperlipidemia, unspecified: Secondary | ICD-10-CM

## 2013-01-13 LAB — NMR LIPOPROFILE WITH LIPIDS
Cholesterol, Total: 192 mg/dL (ref <200)
HDL Particle Number: 35.9 umol/L (ref >=30.5)
HDL Size: 9.6 nm (ref >=9.2)
HDL-C: 55 mg/dL (ref >=40)
LDL (calc): 114 mg/dL — ABNORMAL HIGH (ref <100)
LDL Particle Number: 1390 nmol/L — ABNORMAL HIGH (ref <1000)
LDL Size: 21.2 nm (ref >20.5)
LP-IR Score: 27 (ref <=45)
Large HDL-P: 8.7 umol/L (ref >=4.8)
Large VLDL-P: 2.8 nmol/L — ABNORMAL HIGH (ref <=2.7)
Small LDL Particle Number: 416 nmol/L (ref <=527)
Triglycerides: 116 mg/dL (ref <150)
VLDL Size: 42.3 nm (ref <=46.6)

## 2013-01-13 MED ORDER — ATORVASTATIN CALCIUM 10 MG PO TABS: 10.0000 mg | ORAL_TABLET | Freq: Every day | ORAL | Status: DC

## 2013-02-19 NOTE — Progress Notes (Signed)
REVIEWED.  Lindsay Mitchell had a stroke. CANCELLED TCS MAR 2014. Will call PT TO SE IF SHE IS READY FOR HER TCS. IF NOT, OPV IN SEP 2014 E30 NAUSEA/WEIGHT LOSS/LWR ABD PAIN.

## 2013-02-21 ENCOUNTER — Encounter: Payer: Self-pay | Admitting: Gastroenterology

## 2013-02-21 NOTE — Progress Notes (Signed)
Pt is aware of OV on 8/27 at 10am with SF and appt card was mailed

## 2013-04-12 ENCOUNTER — Ambulatory Visit (INDEPENDENT_AMBULATORY_CARE_PROVIDER_SITE_OTHER): Payer: Medicare Other | Admitting: Family Medicine

## 2013-04-12 ENCOUNTER — Encounter: Payer: Self-pay | Admitting: Family Medicine

## 2013-04-12 VITALS — BP 107/64 | HR 84 | Temp 98.2°F | Wt 107.6 lb

## 2013-04-12 DIAGNOSIS — G8929 Other chronic pain: Secondary | ICD-10-CM | POA: Insufficient documentation

## 2013-04-12 DIAGNOSIS — J441 Chronic obstructive pulmonary disease with (acute) exacerbation: Secondary | ICD-10-CM

## 2013-04-12 DIAGNOSIS — G589 Mononeuropathy, unspecified: Secondary | ICD-10-CM

## 2013-04-12 DIAGNOSIS — F4320 Adjustment disorder, unspecified: Secondary | ICD-10-CM

## 2013-04-12 DIAGNOSIS — J449 Chronic obstructive pulmonary disease, unspecified: Secondary | ICD-10-CM | POA: Insufficient documentation

## 2013-04-12 DIAGNOSIS — E785 Hyperlipidemia, unspecified: Secondary | ICD-10-CM

## 2013-04-12 DIAGNOSIS — G629 Polyneuropathy, unspecified: Secondary | ICD-10-CM

## 2013-04-12 DIAGNOSIS — M549 Dorsalgia, unspecified: Secondary | ICD-10-CM

## 2013-04-12 DIAGNOSIS — I6529 Occlusion and stenosis of unspecified carotid artery: Secondary | ICD-10-CM

## 2013-04-12 MED ORDER — IPRATROPIUM-ALBUTEROL 0.5-2.5 (3) MG/3ML IN SOLN: 3.0000 mL | Freq: Four times a day (QID) | RESPIRATORY_TRACT | Status: DC | PRN

## 2013-04-12 MED ORDER — GABAPENTIN 300 MG PO CAPS: 300.0000 mg | ORAL_CAPSULE | Freq: Three times a day (TID) | ORAL | Status: DC

## 2013-04-12 NOTE — Progress Notes (Signed)
Patient ID: Lindsay Mitchell, female   DOB: 1941/08/29, 72 y.o.   MRN: [id number] SUBJECTIVE: CC: Chief Complaint  Patient presents with  . Follow-up    3 month follow up     HPI: Patient is here for follow up of hyperlipidemia: denies Headache;denies Chest Pain;denies weakness; Has Shortness of Breath from her COPD . No orthopnea;denies Visual changes;denies palpitations;denies cough;denies pedal edema;denies symptoms of TIA or stroke;deniesClaudication symptoms. admits to Compliance with medications; denies Problems with medications.   Here for follow up of her COPD , adjustment reaction and chronic back pain. She has been stable and holding well. The medications have kept her well.  Past Medical History  Diagnosis Date  . Constipation   . Depression with anxiety   . Diverticulosis   . DVT (deep venous thrombosis)   . COPD (chronic obstructive pulmonary disease)   . Carotid artery occlusion    Past Surgical History  Procedure Laterality Date  . Abdominal hysterectomy    . Back surgery      X3  . Colonoscopy  June 2002    Dr. Noe Gens: severe diverticular disease with haustral hypertrophy, primary and secondary diverticulosis, persistent spasticity, early stenosis   History   Social History  . Marital Status: Divorced    Spouse Name: N/A    Number of Children: 4  . Years of Education: N/A   Occupational History  . Not on file.   Social History Main Topics  . Smoking status: Former Smoker    Quit date: 06/11/2012  . Smokeless tobacco: Not on file     Comment: smoked X 60 years  . Alcohol Use: No  . Drug Use: No  . Sexually Active: Yes    Birth Control/ Protection: Surgical   Other Topics Concern  . Not on file   Social History Narrative  . No narrative on file   Family History  Problem Relation Age of Onset  . Colon cancer Neg Hx   . Cancer Mother   . Heart disease Mother   . Hypertension Mother   . Heart attack Mother   . Other Mother     varicose  veins  . Diabetes Brother   . Hypertension Brother    Current Outpatient Prescriptions on File Prior to Visit  Medication Sig Dispense Refill  . lubiprostone (AMITIZA) 24 MCG capsule Take 1 capsule (24 mcg total) by mouth 2 (two) times daily with a meal.  60 capsule  3  . aspirin 325 MG tablet Take 325 mg by mouth daily.      Marland Kitchen atorvastatin (LIPITOR) 10 MG tablet Take 1 tablet (10 mg total) by mouth daily.  30 tablet  3  . beclomethasone (QVAR) 40 MCG/ACT inhaler Inhale 2 puffs into the lungs 2 (two) times daily.  1 Inhaler  5  . mirtazapine (REMERON) 15 MG tablet Take 1 tablet (15 mg total) by mouth daily.  30 tablet  5   No current facility-administered medications on file prior to visit.   Allergies  Allergen Reactions  . Codeine   . Talwin (Pentazocine)   . Valium (Diazepam)   . Vicodin (Hydrocodone-Acetaminophen)     There is no immunization history on file for this patient. Prior to Admission medications   Medication Sig Start Date End Date Taking? Authorizing Provider  gabapentin (NEURONTIN) 300 MG capsule Take 1 capsule (300 mg total) by mouth 3 (three) times daily. 04/12/13  Yes Ileana Ladd, MD  ipratropium-albuterol (DUONEB) 0.5-2.5 (3) MG/3ML SOLN Take 3  mLs by nebulization every 6 (six) hours as needed. 04/12/13  Yes Ileana Ladd, MD  lubiprostone (AMITIZA) 24 MCG capsule Take 1 capsule (24 mcg total) by mouth 2 (two) times daily with a meal. 10/28/12  Yes Nira Retort, NP  aspirin 325 MG tablet Take 325 mg by mouth daily.    Historical Provider, MD  atorvastatin (LIPITOR) 10 MG tablet Take 1 tablet (10 mg total) by mouth daily. 01/13/13   Ileana Ladd, MD  beclomethasone (QVAR) 40 MCG/ACT inhaler Inhale 2 puffs into the lungs 2 (two) times daily. 01/10/13   Ileana Ladd, MD  mirtazapine (REMERON) 15 MG tablet Take 1 tablet (15 mg total) by mouth daily. 01/10/13   Ileana Ladd, MD     ROS: As above in the HPI. All other systems are stable or  negative.  OBJECTIVE: APPEARANCE:  Patient in no acute distress.The patient appeared well nourished and normally developed. Acyanotic. Waist: VITAL SIGNS:BP 107/64  Pulse 84  Temp(Src) 98.2 F (36.8 C) (Oral)  Wt 107 lb 9.6 oz (48.807 kg)  BMI 19.07 kg/m2 WF  SKIN: warm and  Dry without overt rashes, tattoos and scars  HEAD and Neck: without JVD, Head and scalp: normal Eyes:No scleral icterus. Fundi normal, eye movements normal. Ears: Auricle normal, canal normal, Tympanic membranes normal, insufflation normal. Nose: normal Throat: normal Neck & thyroid: normal  CHEST & LUNGS: Chest wall: normal Lungs: Coarse breath sounds and scattered prolonged expiratory phase.  CVS: Reveals the PMI to be normally located. Regular rhythm, First and Second Heart sounds are normal,  absence of murmurs, rubs or gallops. Peripheral vasculature: Radial pulses: normal Dorsal pedis pulses: normal Posterior pulses: normal  ABDOMEN:  Appearance: normal Benign, no organomegaly, no masses, no Abdominal Aortic enlargement. No Guarding , no rebound. No Bruits. Bowel sounds: normal  RECTAL: N/A GU: N/A  EXTREMETIES: nonedematous. Both Femoral and Pedal pulses are normal.  MUSCULOSKELETAL:  Spine:Reduced ROM. Joints: arthritic changes of the knees.  NEUROLOGIC: oriented to time,place and person; nonfocal. Strength is normal Sensory is normal Reflexes are normal Cranial Nerves are normal.   Results for orders placed in visit on 01/10/13  COMPLETE METABOLIC PANEL WITH GFR      Result Value Range   Sodium 142  135 - 145 mEq/L   Potassium 4.1  3.5 - 5.3 mEq/L   Chloride 104  96 - 112 mEq/L   CO2 28  19 - 32 mEq/L   Glucose, Bld 91  70 - 99 mg/dL   BUN 31 (*) 6 - 23 mg/dL   Creat 4.09  8.11 - 9.14 mg/dL   Total Bilirubin 0.3  0.3 - 1.2 mg/dL   Alkaline Phosphatase 89  39 - 117 U/L   AST 16  0 - 37 U/L   ALT 8  0 - 35 U/L   Total Protein 6.3  6.0 - 8.3 g/dL   Albumin 4.2  3.5  - 5.2 g/dL   Calcium 9.6  8.4 - 78.2 mg/dL   GFR, Est African American 63     GFR, Est Non African American 54 (*)   NMR LIPOPROFILE WITH LIPIDS      Result Value Range   LDL Particle Number 1390 (*) <1000 nmol/L   LDL (calc) 114 (*) <100 mg/dL   HDL-C 55  >=95 mg/dL   Triglycerides 621  <308 mg/dL   Cholesterol, Total 657  <200 mg/dL   HDL Particle Number 84.6  >=96.2 umol/L   Large  HDL-P 8.7  >=4.8 umol/L   Large VLDL-P 2.8 (*) <=2.7 nmol/L   Small LDL Particle Number 416  <=527 nmol/L   LDL Size 21.2  >20.5 nm   HDL Size 9.6  >=9.2 nm   VLDL Size 42.3  <=46.6 nm   LP-IR Score 27  <=45    ASSESSMENT: COPD exacerbation - Plan: ipratropium-albuterol (DUONEB) 0.5-2.5 (3) MG/3ML SOLN  HLD (hyperlipidemia) - Plan: CMP14+EGFR, NMR, lipoprofile  Neuropathy - Plan: gabapentin (NEURONTIN) 300 MG capsule  Chronic back pain  COPD (chronic obstructive pulmonary disease)  Carotid stenosis, unspecified laterality  Adjustment reaction  PLAN: Orders Placed This Encounter  Procedures  . CMP14+EGFR  . NMR, lipoprofile   Meds ordered this encounter  Medications  . DISCONTD: gabapentin (NEURONTIN) 300 MG capsule    Sig: Take 300 mg by mouth 2 (two) times daily.   Marland Kitchen ipratropium-albuterol (DUONEB) 0.5-2.5 (3) MG/3ML SOLN    Sig: Take 3 mLs by nebulization every 6 (six) hours as needed.    Dispense:  360 mL    Refill:  3  . gabapentin (NEURONTIN) 300 MG capsule    Sig: Take 1 capsule (300 mg total) by mouth 3 (three) times daily.    Dispense:  90 capsule    Refill:  5    Medications Discontinued During This Encounter  Medication Reason  . amoxicillin (AMOXIL) 875 MG tablet Completed Course  . amoxicillin-clarithromycin-lansoprazole (PREVPAC) combo pack Completed Course  . dexamethasone (DECADRON) 6 MG tablet Completed Course  . clonazePAM (KLONOPIN) 1 MG tablet Completed Course  . doxycycline (VIBRAMYCIN) 100 MG capsule Completed Course  . HYDROcodone-acetaminophen  (NORCO/VICODIN) 5-325 MG per tablet Completed Course  . polyethylene glycol-electrolytes (TRILYTE) 420 G solution Completed Course  . ipratropium-albuterol (DUONEB) 0.5-2.5 (3) MG/3ML SOLN Reorder  . gabapentin (NEURONTIN) 300 MG capsule Reorder   Reviewed previous labs. Reviewed medications.  Return in about 3 months (around 07/13/2013) for Recheck medical problems.  Petronella Shuford P. [place] Charon, M.D.

## 2013-04-13 LAB — CMP14+EGFR
ALT: 6 IU/L (ref 0–32)
AST: 19 IU/L (ref 0–40)
Albumin/Globulin Ratio: 2.3 (ref 1.1–2.5)
Albumin: 4.4 g/dL (ref 3.6–4.8)
Alkaline Phosphatase: 96 IU/L (ref 39–117)
BUN/Creatinine Ratio: 36 — ABNORMAL HIGH (ref 11–26)
BUN: 30 mg/dL — ABNORMAL HIGH (ref 8–27)
CO2: 27 mmol/L (ref 18–29)
Calcium: 9.6 mg/dL (ref 8.6–10.2)
Chloride: 102 mmol/L (ref 97–108)
Creatinine, Ser: 0.83 mg/dL (ref 0.57–1.00)
GFR calc Af Amer: 83 mL/min/{1.73_m2} (ref >59)
GFR calc non Af Amer: 72 mL/min/{1.73_m2} (ref >59)
Globulin, Total: 1.9 g/dL (ref 1.5–4.5)
Glucose: 70 mg/dL (ref 65–99)
Potassium: 4.5 mmol/L (ref 3.5–5.2)
Sodium: 144 mmol/L (ref 134–144)
Total Bilirubin: 0.2 mg/dL (ref 0.0–1.2)
Total Protein: 6.3 g/dL (ref 6.0–8.5)

## 2013-04-14 LAB — NMR, LIPOPROFILE
Cholesterol: 163 mg/dL (ref <200)
HDL Cholesterol by NMR: 61 mg/dL (ref >=40)
HDL Particle Number: 43.4 umol/L (ref >=30.5)
LDL Particle Number: 921 nmol/L (ref <1000)
LDL Size: 20.5 nm — ABNORMAL LOW (ref >20.5)
LDLC SERPL CALC-MCNC: 73 mg/dL (ref <100)
LP-IR Score: <25 (ref <=45)
Small LDL Particle Number: 453 nmol/L (ref <=527)
Triglycerides by NMR: 145 mg/dL (ref <150)

## 2013-04-14 NOTE — Progress Notes (Signed)
Quick Note:  Lab result at goal. No change in Medications for now. No Change in plans and follow up. ______ 

## 2013-04-15 ENCOUNTER — Telehealth: Payer: Self-pay

## 2013-04-15 ENCOUNTER — Telehealth: Payer: Self-pay | Admitting: Family Medicine

## 2013-04-15 NOTE — Telephone Encounter (Signed)
No fever  Stuffy nose cant breathe from nose Cough  Left ear pain  Gums hurt   Per dr Modesto Charon  Needs office visit on sat   Pt aware and will be seen in am

## 2013-04-15 NOTE — Telephone Encounter (Signed)
Pt notified and lab results given  

## 2013-04-16 ENCOUNTER — Ambulatory Visit (INDEPENDENT_AMBULATORY_CARE_PROVIDER_SITE_OTHER): Payer: Medicare Other | Admitting: Nurse Practitioner

## 2013-04-16 VITALS — BP 135/72 | HR 65 | Temp 96.9°F | Ht 63.0 in | Wt 108.0 lb

## 2013-04-16 DIAGNOSIS — J019 Acute sinusitis, unspecified: Secondary | ICD-10-CM

## 2013-04-16 MED ORDER — AMOXICILLIN 875 MG PO TABS: 875.0000 mg | ORAL_TABLET | Freq: Two times a day (BID) | ORAL | Status: DC

## 2013-04-16 NOTE — Progress Notes (Signed)
  Subjective:    Patient ID: Lindsay Mitchell, female    DOB: 11/02/1940, 72 y.o.   MRN: [id number]  HPI Patient in this morning c/o sinus congecstion that started aboout 2 weks ago- Has ot gotten any better. Her teeth have started to hurt in the last 2 days. No fever. No cough    Review of Systems  Constitutional: Positive for fatigue. Negative for fever.  HENT: Positive for ear pain (left), congestion, rhinorrhea, mouth sores and sinus pressure.   Respiratory: Negative.  Negative for cough.   Cardiovascular: Negative.        Objective:   Physical Exam  Constitutional: She is oriented to person, place, and time. She appears well-developed and well-nourished.  HENT:  Right Ear: Hearing, tympanic membrane, external ear and ear canal normal.  Left Ear: Hearing, tympanic membrane, external ear and ear canal normal.  Nose: Mucosal edema and rhinorrhea present. Right sinus exhibits no maxillary sinus tenderness and no frontal sinus tenderness. Left sinus exhibits no maxillary sinus tenderness and no frontal sinus tenderness.  Mouth/Throat: Mucous membranes are normal. Posterior oropharyngeal erythema present.  Cardiovascular: Normal rate and normal heart sounds.   Pulmonary/Chest: Effort normal and breath sounds normal.  Neurological: She is alert and oriented to person, place, and time.  Skin: Skin is warm.    BP 135/72  Pulse 65  Temp(Src) 96.9 F (36.1 C) (Oral)  Ht 5\' 3"  (1.6 m)  Wt 108 lb (48.988 kg)  BMI 19.14 kg/m2       Assessment & Plan:  1. Acute rhinosinusitis 1. Take meds as prescribed 2. Use a cool mist humidifier especially during the winter months and when heat has  been humid. 3. Use saline nose sprays frequently 4. Saline irrigations of the nose can be very helpful if done frequently.  * 4X daily for 1 week*  * Use of a nettie pot can be helpful with this. Follow directions with this* 5. Drink plenty of fluids 6. Keep thermostat turn down low 7.For any  cough or congestion  Use plain Norel Aad as rx 8. For fever or aces or pains- take tylenol or ibuprofen appropriate for age and weight.  * for fevers greater than 101 orally you may alternate ibuprofen and tylenol every  3 hours.    *Norel AD samples- 1 po Q6 prn #8 - amoxicillin (AMOXIL) 875 MG tablet; Take 1 tablet (875 mg total) by mouth 2 (two) times daily.  Dispense: 20 tablet; Refill: 0  Mary-Margaret Daphine Deutscher, FNP

## 2013-04-16 NOTE — Patient Instructions (Signed)
1. Take meds as prescribed 2. Use a cool mist humidifier especially during the winter months and when heat has  been humid. 3. Use saline nose sprays frequently 4. Saline irrigations of the nose can be very helpful if done frequently.  * 4X daily for 1 week*  * Use of a nettie pot can be helpful with this. Follow directions with this* 5. Drink plenty of fluids 6. Keep thermostat turn down low 7.For any cough or congestion  Use NOrel AD as rx 8. For fever or aces or pains- take tylenol or ibuprofen appropriate for age and weight.  * for fevers greater than 101 orally you may alternate ibuprofen and tylenol every  3 hours.

## 2013-04-27 ENCOUNTER — Ambulatory Visit: Payer: Medicare Other | Admitting: Gastroenterology

## 2013-05-09 ENCOUNTER — Other Ambulatory Visit: Payer: Self-pay | Admitting: *Deleted

## 2013-05-09 DIAGNOSIS — E785 Hyperlipidemia, unspecified: Secondary | ICD-10-CM

## 2013-05-09 MED ORDER — ATORVASTATIN CALCIUM 10 MG PO TABS: 10.0000 mg | ORAL_TABLET | Freq: Every day | ORAL | Status: DC

## 2013-05-10 ENCOUNTER — Encounter: Payer: Self-pay | Admitting: Family Medicine

## 2013-05-10 ENCOUNTER — Telehealth: Payer: Self-pay | Admitting: Family Medicine

## 2013-05-10 ENCOUNTER — Ambulatory Visit (HOSPITAL_COMMUNITY)
Admission: RE | Admit: 2013-05-10 | Discharge: 2013-05-10 | Disposition: A | Discharge status: Home / Self Care | Payer: Medicare Other | Source: Ambulatory Visit | Attending: Family Medicine | Admitting: Family Medicine

## 2013-05-10 ENCOUNTER — Ambulatory Visit (INDEPENDENT_AMBULATORY_CARE_PROVIDER_SITE_OTHER): Payer: Medicare Other | Admitting: Family Medicine

## 2013-05-10 VITALS — BP 134/77 | HR 71 | Temp 97.5°F | Ht 63.0 in | Wt 108.0 lb

## 2013-05-10 DIAGNOSIS — R51 Headache: Secondary | ICD-10-CM

## 2013-05-10 LAB — POCT CBC
Granulocyte percent: 57 %G (ref 37–80)
HCT, POC: 42.4 % (ref 37.7–47.9)
Hemoglobin: 13.7 g/dL (ref 12.2–16.2)
Lymph, poc: 2.9 (ref 0.6–3.4)
MCH, POC: 29.8 pg (ref 27–31.2)
MCHC: 32.3 g/dL (ref 31.8–35.4)
MCV: 92.1 fL (ref 80–97)
MPV: 8.7 fL (ref 0–99.8)
POC Granulocyte: 4.4 (ref 2–6.9)
POC LYMPH PERCENT: 38.7 %L (ref 10–50)
Platelet Count, POC: 201 10*3/uL (ref 142–424)
RBC: 4.6 M/uL (ref 4.04–5.48)
RDW, POC: 12.7 %
WBC: 7.8 10*3/uL (ref 4.6–10.2)

## 2013-05-10 MED ORDER — PREDNISONE 50 MG PO TABS: ORAL_TABLET | ORAL | Status: DC

## 2013-05-10 NOTE — Progress Notes (Signed)
  Subjective:    Patient ID: Lindsay Mitchell, female    DOB: October 20, 1940, 73 y.o.   MRN: [id number]  HPI  Patient presents today with chief complaint of headache. Patient states she was seen 2-3 weeks ago for questionable sinus infection. Patient was placed on a course of amoxicillin for treatment. Patient states his symptoms have persisted despite treatment. Has a predominant right-sided headache with some retro-orbital pain as well as temporal tenderness as well as occipital tenderness. Patient denies any loss of vision. Has had some right-sided tooth pain as well. No nuchal rigidity or meningismus. Patient denies any head trauma. Baseline history of COPD and carotid stenosis. Patient denies any hemiparesis or confusion. Patient reports a prior history of migraines in the past. Headache is not similar to her previous migraines   Review of Systems  All other systems reviewed and are negative.       Objective:   Physical Exam  Constitutional: She is oriented to person, place, and time. She appears well-developed and well-nourished.  HENT:  Head: Normocephalic.  Positive markedly right-sided temporal tenderness palpation. Occipital tenderness palpation. Neck with full range of motion.  Eyes: Conjunctivae are normal. Pupils are equal, round, and reactive to light.  Bilateral corneal implants present  Neck: Normal range of motion. Neck supple.  Cardiovascular: Normal rate and regular rhythm.   Pulmonary/Chest: Effort normal.  Abdominal: Soft. Bowel sounds are normal.  Musculoskeletal: Normal range of motion.  Lymphadenopathy:    She has no cervical adenopathy.  Neurological: She is alert and oriented to person, place, and time. No cranial nerve deficit.  No nuchal rigidity Kernig and Brudzinski negative  Skin: Skin is warm.          Assessment & Plan:  Headache(784.0) - Plan: POCT CBC, CT Head Wo Contrast, Sedimentation Rate, Comprehensive metabolic panel, C-reactive  protein, predniSONE (DELTASONE) 50 MG tablet  differential diagnosis for headache is fairly broad including atypical migraine, sinusitis, vasculitis. Given overall presentation with age and gender vasculitis is much higher up on the differential diagnosis. Will obtain head CT without contrast to further evaluate etiology of headache. Will also check baseline labs including sedimentation rate, CRP, CBC, CMP. Patient is a radial full dose aspirin. Start patient on prednisone 50 mg daily pending CT as well as sedimentation rate. May need temporal artery biopsy pending imaging and lab results. Discuss neurovascular red flags at length patient. Followup pending CT and blood work.

## 2013-05-10 NOTE — Telephone Encounter (Signed)
Directions were one tablet daily for 7 days #21.  Pharmacy questioned the quantity.  Verified with Dr. Alvester Morin that he wanted her to have extra in case we needed to change the directions based on lab results. Pharmacy and patient notified.  Pharmacy will fill prescription.  Patient aware that she will have 14 extra pills for now.

## 2013-05-11 ENCOUNTER — Other Ambulatory Visit: Payer: Self-pay | Admitting: *Deleted

## 2013-05-11 DIAGNOSIS — R519 Headache, unspecified: Secondary | ICD-10-CM

## 2013-05-11 LAB — COMPREHENSIVE METABOLIC PANEL
ALT: 10 IU/L (ref 0–32)
AST: 17 IU/L (ref 0–40)
Albumin/Globulin Ratio: 2.2 (ref 1.1–2.5)
Albumin: 4.4 g/dL (ref 3.6–4.8)
Alkaline Phosphatase: 92 IU/L (ref 39–117)
BUN/Creatinine Ratio: 22 (ref 11–26)
BUN: 19 mg/dL (ref 8–27)
CO2: 28 mmol/L (ref 18–29)
Calcium: 9.8 mg/dL (ref 8.6–10.2)
Chloride: 99 mmol/L (ref 97–108)
Creatinine, Ser: 0.88 mg/dL (ref 0.57–1.00)
GFR calc Af Amer: 78 mL/min/{1.73_m2} (ref >59)
GFR calc non Af Amer: 67 mL/min/{1.73_m2} (ref >59)
Globulin, Total: 2 g/dL (ref 1.5–4.5)
Glucose: 84 mg/dL (ref 65–99)
Potassium: 4.9 mmol/L (ref 3.5–5.2)
Sodium: 141 mmol/L (ref 134–144)
Total Bilirubin: 0.3 mg/dL (ref 0.0–1.2)
Total Protein: 6.4 g/dL (ref 6.0–8.5)

## 2013-05-11 LAB — SEDIMENTATION RATE: Sed Rate: 4 mm/hr (ref 0–40)

## 2013-05-11 LAB — C-REACTIVE PROTEIN: CRP: 0.7 mg/L (ref 0.0–4.9)

## 2013-05-17 ENCOUNTER — Encounter: Payer: Self-pay | Admitting: *Deleted

## 2013-05-20 ENCOUNTER — Other Ambulatory Visit (HOSPITAL_COMMUNITY): Payer: Self-pay | Admitting: Internal Medicine

## 2013-05-20 DIAGNOSIS — R109 Unspecified abdominal pain: Secondary | ICD-10-CM

## 2013-05-23 ENCOUNTER — Ambulatory Visit (HOSPITAL_COMMUNITY)
Admission: RE | Admit: 2013-05-23 | Discharge: 2013-05-23 | Disposition: A | Discharge status: Home / Self Care | Payer: Medicare Other | Source: Ambulatory Visit | Attending: Internal Medicine | Admitting: Internal Medicine

## 2013-05-23 DIAGNOSIS — R109 Unspecified abdominal pain: Secondary | ICD-10-CM

## 2013-05-23 MED ORDER — IOHEXOL 300 MG/ML  SOLN
100.0000 mL | Freq: Once | INTRAMUSCULAR | Status: AC | PRN
  Administered 2013-05-23: 100 mL via INTRAVENOUS

## 2013-05-30 ENCOUNTER — Telehealth: Payer: Self-pay

## 2013-05-30 NOTE — Telephone Encounter (Signed)
I don't see this as an urgent visit. Continue to take Amitiza 24 mcg po BID, follow a high fiber diet.

## 2013-05-30 NOTE — Telephone Encounter (Addendum)
Pt returned call and said that she has had trouble with constipation for years, and it has been worse for the last 6 months. The doctors at [facility] put her on Amitiza 24 mcg bid and it has helped some. She took 3-4 of them in a single day and had a several BM's 3 days ago, but nothing since. The next morning she said it felt like she was backed up again and her right side has been hurting some since. ( See recent CT report and advise if pt should come in before 06/08/2013). Pt also said she had been advised by [place] doctors to do a fleet enema and she is doing that soon today.

## 2013-05-30 NOTE — Telephone Encounter (Signed)
Called and informed pt. Told her to keep the appt with Dr. Darrick Penna for 06/08/2013 at 2:00 pm.

## 2013-05-30 NOTE — Telephone Encounter (Signed)
Pt was referred by DR. Phillips Odor for Unbearable constipation and asked for STAT appt. Darl Pikes had given appt on 06/08/2013 at 2:00 pm with SF).I called pt , LMOM for a return call to get more info.

## 2013-05-31 ENCOUNTER — Encounter (HOSPITAL_COMMUNITY): Payer: Self-pay | Admitting: Emergency Medicine

## 2013-05-31 ENCOUNTER — Emergency Department (HOSPITAL_COMMUNITY): Payer: Medicare Other

## 2013-05-31 ENCOUNTER — Emergency Department (HOSPITAL_COMMUNITY)
Admission: EM | Admit: 2013-05-31 | Discharge: 2013-05-31 | Disposition: A | Discharge status: Home / Self Care | Payer: Medicare Other | Attending: Emergency Medicine | Admitting: Emergency Medicine

## 2013-05-31 DIAGNOSIS — R1084 Generalized abdominal pain: Secondary | ICD-10-CM | POA: Insufficient documentation

## 2013-05-31 DIAGNOSIS — F341 Dysthymic disorder: Secondary | ICD-10-CM | POA: Insufficient documentation

## 2013-05-31 DIAGNOSIS — Z90710 Acquired absence of both cervix and uterus: Secondary | ICD-10-CM | POA: Insufficient documentation

## 2013-05-31 DIAGNOSIS — K5900 Constipation, unspecified: Secondary | ICD-10-CM

## 2013-05-31 DIAGNOSIS — Z79899 Other long term (current) drug therapy: Secondary | ICD-10-CM | POA: Insufficient documentation

## 2013-05-31 DIAGNOSIS — J4489 Other specified chronic obstructive pulmonary disease: Secondary | ICD-10-CM | POA: Insufficient documentation

## 2013-05-31 DIAGNOSIS — J449 Chronic obstructive pulmonary disease, unspecified: Secondary | ICD-10-CM | POA: Insufficient documentation

## 2013-05-31 DIAGNOSIS — Z9889 Other specified postprocedural states: Secondary | ICD-10-CM | POA: Insufficient documentation

## 2013-05-31 DIAGNOSIS — F17200 Nicotine dependence, unspecified, uncomplicated: Secondary | ICD-10-CM | POA: Insufficient documentation

## 2013-05-31 DIAGNOSIS — IMO0002 Reserved for concepts with insufficient information to code with codable children: Secondary | ICD-10-CM | POA: Insufficient documentation

## 2013-05-31 DIAGNOSIS — Z8679 Personal history of other diseases of the circulatory system: Secondary | ICD-10-CM | POA: Insufficient documentation

## 2013-05-31 DIAGNOSIS — Z7982 Long term (current) use of aspirin: Secondary | ICD-10-CM | POA: Insufficient documentation

## 2013-05-31 DIAGNOSIS — Z86718 Personal history of other venous thrombosis and embolism: Secondary | ICD-10-CM | POA: Insufficient documentation

## 2013-05-31 MED ORDER — DICYCLOMINE HCL 20 MG PO TABS: 20.0000 mg | ORAL_TABLET | Freq: Two times a day (BID) | ORAL | Status: DC

## 2013-05-31 MED ORDER — DICYCLOMINE HCL 10 MG PO CAPS
ORAL_CAPSULE | ORAL | Status: AC
  Administered 2013-05-31: 20 mg via ORAL
  Filled 2013-05-31: qty 2

## 2013-05-31 MED ORDER — MAGNESIUM CITRATE PO SOLN: 1.0000 | Freq: Once | ORAL | Status: DC

## 2013-05-31 MED ORDER — DICYCLOMINE HCL 10 MG PO CAPS
20.0000 mg | ORAL_CAPSULE | Freq: Once | ORAL | Status: AC
  Administered 2013-05-31: 20 mg via ORAL

## 2013-05-31 MED ORDER — POLYETHYLENE GLYCOL 3350 17 GM/SCOOP PO POWD: 17.0000 g | Freq: Every day | ORAL | Status: DC

## 2013-05-31 MED ORDER — DOCUSATE SODIUM 100 MG PO CAPS: 100.0000 mg | ORAL_CAPSULE | Freq: Two times a day (BID) | ORAL | Status: DC

## 2013-05-31 NOTE — ED Notes (Signed)
Pt states she has not been able to have a bm in 2-3 days.

## 2013-05-31 NOTE — ED Provider Notes (Signed)
CSN: [id number]     Arrival date & time 05/31/13  0359 History   First MD Initiated Contact with Patient 05/31/13 0400     Chief Complaint  Patient presents with  . Constipation   (Consider location/radiation/quality/duration/timing/severity/associated sxs/prior Treatment) Patient is a 72 y.o. female presenting with constipation.  Constipation  Pt with history of chronic constipation reports 2-3 days of worsening diffuse cramping abdominal pain and no BM. She has been taking Amitiza prescribed by PCP but no longer taking any stool softeners or laxatives. She is taking the Amitiza more often than prescribed. She tried enema x 2 at home today with no improvement. Denies fever, bloody stools or vomiting.   Past Medical History  Diagnosis Date  . Constipation   . Depression with anxiety   . Diverticulosis   . DVT (deep venous thrombosis)   . COPD (chronic obstructive pulmonary disease)   . Carotid artery occlusion   . Anxiety    Past Surgical History  Procedure Laterality Date  . Abdominal hysterectomy    . Back surgery      X3  . Colonoscopy  June 2002    Dr. Noe Gens: severe diverticular disease with haustral hypertrophy, primary and secondary diverticulosis, persistent spasticity, early stenosis   Family History  Problem Relation Age of Onset  . Colon cancer Neg Hx   . Cancer Mother   . Heart disease Mother   . Hypertension Mother   . Heart attack Mother   . Other Mother     varicose veins  . Diabetes Brother   . Hypertension Brother    History  Substance Use Topics  . Smoking status: Current Every Day Smoker    Types: Cigarettes    Last Attempt to Quit: 06/11/2012  . Smokeless tobacco: Not on file     Comment: smoked X 60 years  . Alcohol Use: No   OB History   Grav Para Term Preterm Abortions TAB SAB Ect Mult Living                 Review of Systems  Gastrointestinal: Positive for constipation.   All other systems reviewed and are negative except as noted in  HPI.   Allergies  Codeine; Talwin; Valium; and Vicodin  Home Medications   Current Outpatient Rx  Name  Route  Sig  Dispense  Refill  . aspirin 325 MG tablet   Oral   Take 325 mg by mouth daily.         Marland Kitchen atorvastatin (LIPITOR) 10 MG tablet   Oral   Take 1 tablet (10 mg total) by mouth daily.   30 tablet   5   . beclomethasone (QVAR) 40 MCG/ACT inhaler   Inhalation   Inhale 2 puffs into the lungs 2 (two) times daily.   1 Inhaler   5   . ipratropium-albuterol (DUONEB) 0.5-2.5 (3) MG/3ML SOLN   Nebulization   Take 3 mLs by nebulization every 6 (six) hours as needed.   360 mL   3   . lubiprostone (AMITIZA) 24 MCG capsule   Oral   Take 1 capsule (24 mcg total) by mouth 2 (two) times daily with a meal.   60 capsule   3   . mirtazapine (REMERON) 15 MG tablet   Oral   Take 1 tablet (15 mg total) by mouth daily.   30 tablet   5    BP 138/70  Pulse 96  Temp(Src) 98.4 F (36.9 C)  Resp 21  SpO2  92% Physical Exam  Nursing note and vitals reviewed. Constitutional: She is oriented to person, place, and time. She appears well-developed and well-nourished.  HENT:  Head: Normocephalic and atraumatic.  Eyes: EOM are normal. Pupils are equal, round, and reactive to light.  Neck: Normal range of motion. Neck supple.  Cardiovascular: Normal rate, normal heart sounds and intact distal pulses.   Pulmonary/Chest: Effort normal and breath sounds normal.  Abdominal: Soft. She exhibits no distension. Bowel sounds are decreased. There is tenderness (diffuse tenderness). There is no rebound and no guarding.  Genitourinary:  No masses, no bleeding, no impaction and no stool in rectal vault  Musculoskeletal: Normal range of motion. She exhibits no edema and no tenderness.  Neurological: She is alert and oriented to person, place, and time. She has normal strength. No cranial nerve deficit or sensory deficit.  Skin: Skin is warm and dry. No rash noted.  Psychiatric: She has a  normal mood and affect.    ED Course  Procedures (including critical care time) Labs Review Labs Reviewed - No data to display Imaging Review Dg Abd Acute W/chest  05/31/2013   CLINICAL DATA:  Abdominal pain.  EXAM: ACUTE ABDOMEN SERIES (ABDOMEN 2 VIEW & CHEST 1 VIEW)  COMPARISON:  05/23/2013  FINDINGS: Hyperinflation of the lungs compatible with COPD. Heart and mediastinal contours are within normal limits. No focal opacities or effusions. No acute bony abnormality.  The the moderate stool burden in the colon. Nonobstructive bowel gas pattern. No free air. No organomegaly. Degenerative changes in the lumbar spine and hips.  IMPRESSION: No evidence of bowel obstruction or free air. Moderate stool burden.  COPD.   Electronically Signed   By: Charlett Nose M.D.   On: 05/31/2013 05:13    MDM   1. Constipation     Pt with symptoms consistent with her chronic constipation. Improved some with bentyl. No fecal impaction. Advised to resume daily or twice daily Miralax and Colace long term. Magnesium Citrate in short term with close GI followup.   Carlis Burnsworth B. Bernette Mayers, MD 05/31/13 [phone]

## 2013-06-01 ENCOUNTER — Ambulatory Visit: Payer: Medicare Other | Admitting: Neurology

## 2013-06-01 ENCOUNTER — Telehealth: Payer: Self-pay

## 2013-06-01 ENCOUNTER — Encounter: Payer: Self-pay | Admitting: Gastroenterology

## 2013-06-01 ENCOUNTER — Ambulatory Visit (INDEPENDENT_AMBULATORY_CARE_PROVIDER_SITE_OTHER): Payer: Medicare Other | Admitting: Gastroenterology

## 2013-06-01 VITALS — BP 108/66 | HR 82 | Temp 97.6°F | Ht 62.0 in | Wt 109.2 lb

## 2013-06-01 DIAGNOSIS — K625 Hemorrhage of anus and rectum: Secondary | ICD-10-CM | POA: Insufficient documentation

## 2013-06-01 DIAGNOSIS — K5900 Constipation, unspecified: Secondary | ICD-10-CM

## 2013-06-01 LAB — CBC WITH DIFFERENTIAL/PLATELET
Basophils Absolute: 0 10*3/uL (ref 0.0–0.1)
Basophils Relative: 0 % (ref 0–1)
Eosinophils Absolute: 0.1 10*3/uL (ref 0.0–0.7)
Eosinophils Relative: 1 % (ref 0–5)
HCT: 41.3 % (ref 36.0–46.0)
Hemoglobin: 13.6 g/dL (ref 12.0–15.0)
Lymphocytes Relative: 32 % (ref 12–46)
Lymphs Abs: 3.3 10*3/uL (ref 0.7–4.0)
MCH: 31.9 pg (ref 26.0–34.0)
MCHC: 32.9 g/dL (ref 30.0–36.0)
MCV: 96.7 fL (ref 78.0–100.0)
Monocytes Absolute: 1.5 10*3/uL — ABNORMAL HIGH (ref 0.1–1.0)
Monocytes Relative: 15 % — ABNORMAL HIGH (ref 3–12)
Neutro Abs: 5.4 10*3/uL (ref 1.7–7.7)
Neutrophils Relative %: 52 % (ref 43–77)
Platelets: 220 10*3/uL (ref 150–400)
RBC: 4.27 MIL/uL (ref 3.87–5.11)
RDW: 13.2 % (ref 11.5–15.5)
WBC: 10.4 10*3/uL (ref 4.0–10.5)

## 2013-06-01 MED ORDER — LINACLOTIDE 290 MCG PO CAPS: 290.0000 ug | ORAL_CAPSULE | Freq: Every day | ORAL | Status: DC

## 2013-06-01 MED ORDER — PEG 3350-KCL-NA BICARB-NACL 420 G PO SOLR: 4000.0000 mL | ORAL | Status: DC

## 2013-06-01 NOTE — Assessment & Plan Note (Signed)
72 year old with acute on chronic constipation, thus far unsuccessful with Mag Citrate and enemas. Amitiza 24 mcg BID seems to be losing effect lately. AAS on file without signs of obstruction, CT reviewed as well. Clinically, patient's exam is benign, and I am not concerned about an obstruction or acute process. Discussed bowel purge using either a prescriptive bowel prep or Miralax; she has chosen Miralax, as she has already bought this. She is hesitant to be aggressive with the prep but has agreed to three doses today as prescribed. Start Linzess 290 mcg tomorrow.  Last TCS in 2002 by Dr. Noe Gens at [facility] revealing severe diverticular disease with haustal hypertrophy, persistent spasticity, early narrowing of sigmoid colon. She needs lower GI evaluation in the near future, and this may be a somewhat difficult procedure due to findings on last TCS.   Proceed with colonoscopy with Dr. Darrick Penna in the near future. The risks, benefits, and alternatives have been discussed in detail with the patient. They state understanding and desire to proceed. May need pediatric colonoscope. PROPOFOL due to patient's concern for failed sedation.  Progress report tomorrow.

## 2013-06-01 NOTE — Assessment & Plan Note (Signed)
Offered prescription for Anusol; pt wants to try Preparation H first. No obvious rectal bleeding on exam. Query benign anorectal source. Stat CBC now.

## 2013-06-01 NOTE — Telephone Encounter (Signed)
Pt's husband called and said that the pt needs to be seen earlier than the 06/08/2013 appt that is scheduled. She is having abdominal pain and had not had a BM in 6 days. Went to the ED yesterday. They gave her something and it helped her have a small BM but she had a lot of bright red blood in the stool this morning when she had it. Darl Pikes offered a 9:00 AM with SF, but they could not made it in time for that one. I told Darl Pikes to put her in urgent at 11:30 AM today with AS.

## 2013-06-01 NOTE — Patient Instructions (Addendum)
Please have blood work drawn today. We are checking to make sure you don't have low blood counts.  When you get home:  Start taking Miralax 1 capful each hour up to 6 doses. 30 minutes after each dose, drink a full 8 ounces of water.  Use the preparation H over the counter; please call if you want me to send in a prescription.  We have scheduled you for a colonoscopy with Dr. Darrick Penna in the near future.  Seek medical attention immediately if you have severe abdominal pain, tight abdomen, unable to keep food or liquids down, severe rectal bleeding, fever or chills. Please call tomorrow to give me an update.   I have provided a voucher for a new constipation medication, which you will need to take every day. This is called Linzess. Take it 30 minutes before breakfast, starting tomorrow.

## 2013-06-01 NOTE — Progress Notes (Signed)
Referring Provider: Elfredia Nevins, MD Primary Care Physician:  Cassell Smiles., MD Primary GI: Dr. Darrick Penna   Chief Complaint  Patient presents with  . Constipation    HPI:   72 year old female presents today in an urgent visit due to severe constipation/obstipation. Last seen Feb 2014 with chronic constipation and need for colonoscopy. She was scheduled for screening colonoscopy with Dr. Darrick Penna, but this was cancelled due to work-up for possible TIA. Medicall managed now.  Presented to the ED 9/30 with abdominal cramping, pain, constipation. She has been on Amitiza 24 mcg BID chronically. CT on 9/22 revealed extensive stool throughout colon. AAS yesterday with moderate stool burden, no obstruction or free air. Drank Mag Citrate yesterday evening with watery loose stool this morning. Few blood clots this morning. Miralax daily along with Amitiza BID. No real BM for about 6 days. Pain located lower back/sides. Uncomfortable. No nausea or active vomiting. Last vomiting a few days ago due to pain.   Last TCS in 2002 by Dr. Noe Gens at [facility] revealing severe diverticular disease with haustal hypertrophy, persistent spasticity, early narrowing of sigmoid colon.   Past Medical History  Diagnosis Date  . Constipation   . Depression with anxiety   . Diverticulosis   . DVT (deep venous thrombosis)   . COPD (chronic obstructive pulmonary disease)   . Carotid artery occlusion   . Anxiety     Past Surgical History  Procedure Laterality Date  . Abdominal hysterectomy    . Back surgery      X3  . Colonoscopy  June 2002    Dr. Noe Gens: severe diverticular disease with haustral hypertrophy, primary and secondary diverticulosis, persistent spasticity, early stenosis    Current Outpatient Prescriptions  Medication Sig Dispense Refill  . aspirin 325 MG tablet Take 325 mg by mouth daily.      Marland Kitchen atorvastatin (LIPITOR) 10 MG tablet Take 1 tablet (10 mg total) by mouth daily.  30  tablet  5  . beclomethasone (QVAR) 40 MCG/ACT inhaler Inhale 2 puffs into the lungs 2 (two) times daily.  1 Inhaler  5  . dicyclomine (BENTYL) 20 MG tablet Take 1 tablet (20 mg total) by mouth 2 (two) times daily.  20 tablet  0  . docusate sodium (COLACE) 100 MG capsule Take 1 capsule (100 mg total) by mouth every 12 (twelve) hours.  60 capsule  0  . ipratropium-albuterol (DUONEB) 0.5-2.5 (3) MG/3ML SOLN Take 3 mLs by nebulization every 6 (six) hours as needed.  360 mL  3  . lubiprostone (AMITIZA) 24 MCG capsule Take 1 capsule (24 mcg total) by mouth 2 (two) times daily with a meal.  60 capsule  3  . magnesium citrate SOLN Take 296 mLs (1 Bottle total) by mouth once.  195 mL  0  . mirtazapine (REMERON) 15 MG tablet Take 1 tablet (15 mg total) by mouth daily.  30 tablet  5  . polyethylene glycol powder (GLYCOLAX/MIRALAX) powder Take 17 g by mouth daily.  255 g  0   No current facility-administered medications for this visit.    Allergies as of 06/01/2013 - Review Complete 06/01/2013  Allergen Reaction Noted  . Codeine  06/11/2012  . Talwin [pentazocine]  06/11/2012  . Valium [diazepam]  10/28/2012  . Vicodin [hydrocodone-acetaminophen]  10/28/2012    Family History  Problem Relation Age of Onset  . Colon cancer Neg Hx   . Cancer Mother   . Heart disease Mother   . Hypertension Mother   .  Heart attack Mother   . Other Mother     varicose veins  . Diabetes Brother   . Hypertension Brother     History   Social History  . Marital Status: Married    Spouse Name: N/A    Number of Children: 4  . Years of Education: N/A   Social History Main Topics  . Smoking status: Current Every Day Smoker    Types: Cigarettes    Last Attempt to Quit: 06/11/2012  . Smokeless tobacco: None     Comment: smoked X 60 years  . Alcohol Use: No  . Drug Use: No  . Sexual Activity: Yes    Birth Control/ Protection: Surgical   Other Topics Concern  . None   Social History Narrative  . None     Review of Systems: Negative unless mentioned in HPI.   Physical Exam: BP 108/66  Pulse 82  Temp(Src) 97.6 F (36.4 C) (Oral)  Ht 5\' 2"  (1.575 m)  Wt 109 lb 3.2 oz (49.533 kg)  BMI 19.97 kg/m2 General:   Alert and oriented. No distress noted. Pleasant and cooperative.  Head:  Normocephalic and atraumatic. Eyes:  Conjuctiva clear without scleral icterus. Mouth:  Oral mucosa pink and moist. Good dentition. No lesions. Neck:  Supple, without mass or thyromegaly. Heart:  S1, S2 present without murmurs, rubs, or gallops. Regular rate and rhythm. Lungs: mild expiratory wheeze bilaterally Abdomen:  +BS, soft, TTP lower abdomen and non-distended. No rebound or guarding.  Rectal: exterior exam benign. Internal exam without mass, stricture, or stool in rectal vault. No gross blood noted.  Extremities:  Without edema. Neurologic:  Alert and  oriented x4;  grossly normal neurologically. Skin:  Intact without significant lesions or rashes. Psych:  Alert and cooperative. Normal mood and affect.

## 2013-06-02 ENCOUNTER — Telehealth: Payer: Self-pay

## 2013-06-02 NOTE — Progress Notes (Signed)
CC'd to PCP 

## 2013-06-02 NOTE — Progress Notes (Signed)
Quick Note:  Hgb normal.   ______

## 2013-06-02 NOTE — Telephone Encounter (Signed)
I recommend she take Miralax each hour up to 6 doses. If she wants to try a bowel prep, we can do that, too. Agree with Linzess.

## 2013-06-02 NOTE — Telephone Encounter (Signed)
Pt called this morning to give a update on how she was doing. She is about the same as Wednesday. The drug store did not have the Linzess so I am going to put some samples up front for her.

## 2013-06-07 ENCOUNTER — Encounter (HOSPITAL_COMMUNITY): Payer: Self-pay | Admitting: Pharmacy Technician

## 2013-06-07 NOTE — Telephone Encounter (Signed)
Pt is doing better. She is taking the Linzess.

## 2013-06-08 ENCOUNTER — Ambulatory Visit: Payer: Medicare Other | Admitting: Gastroenterology

## 2013-06-08 NOTE — Patient Instructions (Signed)
Lindsay Mitchell  06/08/2013   Your procedure is scheduled on:  06/14/13  Report to [facility] [place] [facility].  Call this number if you have problems the morning of surgery: [phone]   Remember:   Do not eat food or drink liquids after midnight.   Take these medicines the morning of surgery with A SIP OF WATER: qvar, neurontin, duoneb, prednisone   Do not wear jewelry, make-up or nail polish.  Do not wear lotions, powders, or perfumes. You may wear deodorant.  Do not shave 48 hours prior to surgery. Men may shave face and neck.  Do not bring valuables to the hospital.  [facility] is not responsible                  for any belongings or valuables.               Contacts, dentures or bridgework may not be worn into surgery.  Leave suitcase in the car. After surgery it may be brought to your room.  For patients admitted to the hospital, discharge time is determined by your                treatment team.               Patients discharged the day of surgery will not be allowed to drive  home.  Name and phone number of your driver: family  Special Instructions: N/A   Please read over the following fact sheets that you were given: Anesthesia Post-op Instructions and Care and Recovery After Surgery   PATIENT INSTRUCTIONS POST-ANESTHESIA  IMMEDIATELY FOLLOWING SURGERY:  Do not drive or operate machinery for the first twenty four hours after surgery.  Do not make any important decisions for twenty four hours after surgery or while taking narcotic pain medications or sedatives.  If you develop intractable nausea and vomiting or a severe headache please notify your doctor immediately.  FOLLOW-UP:  Please make an appointment with your surgeon as instructed. You do not need to follow up with anesthesia unless specifically instructed to do so.  WOUND CARE INSTRUCTIONS (if applicable):  Keep a dry clean dressing on the anesthesia/puncture wound site if there is drainage.  Once the wound has quit  draining you may leave it open to air.  Generally you should leave the bandage intact for twenty four hours unless there is drainage.  If the epidural site drains for more than 36-48 hours please call the anesthesia department.  QUESTIONS?:  Please feel free to call your physician or the hospital operator if you have any questions, and they will be happy to assist you.      Colonoscopy A colonoscopy is an exam to evaluate your entire colon. In this exam, your colon is cleansed. A long fiberoptic tube is inserted through your rectum and into your colon. The fiberoptic scope (endoscope) is a long bundle of enclosed and very flexible fibers. These fibers transmit light to the area examined and send images from that area to your caregiver. Discomfort is usually minimal. You may be given a drug to help you sleep (sedative) during or prior to the procedure. This exam helps to detect lumps (tumors), polyps, inflammation, and areas of bleeding. Your caregiver may also take a small piece of tissue (biopsy) that will be examined under a microscope. LET YOUR CAREGIVER KNOW ABOUT:   Allergies to food or medicine.  Medicines taken, including vitamins, herbs, eyedrops, over-the-counter medicines, and creams.  Use  of steroids (by mouth or creams).  Previous problems with anesthetics or numbing medicines.  History of bleeding problems or blood clots.  Previous surgery.  Other health problems, including diabetes and kidney problems.  Possibility of pregnancy, if this applies. BEFORE THE PROCEDURE   A clear liquid diet may be required for 2 days before the exam.  Ask your caregiver about changing or stopping your regular medications.  Liquid injections (enemas) or laxatives may be required.  A large amount of electrolyte solution may be given to you to drink over a short period of time. This solution is used to clean out your colon.  You should be present 60 minutes prior to your procedure or as  directed by your caregiver. AFTER THE PROCEDURE   If you received a sedative or pain relieving medication, you will need to arrange for someone to drive you home.  Occasionally, there is a little blood passed with the first bowel movement. Do not be concerned. FINDING OUT THE RESULTS OF YOUR TEST Not all test results are available during your visit. If your test results are not back during the visit, make an appointment with your caregiver to find out the results. Do not assume everything is normal if you have not heard from your caregiver or the medical facility. It is important for you to follow up on all of your test results. HOME CARE INSTRUCTIONS   It is not unusual to pass moderate amounts of gas and experience mild abdominal cramping following the procedure. This is due to air being used to inflate your colon during the exam. Walking or a warm pack on your belly (abdomen) may help.  You may resume all normal meals and activities after sedatives and medicines have worn off.  Only take over-the-counter or prescription medicines for pain, discomfort, or fever as directed by your caregiver. Do not use aspirin or blood thinners if a biopsy was taken. Consult your caregiver for medicine usage if biopsies were taken. SEEK IMMEDIATE MEDICAL CARE IF:   You have a fever.  You pass large blood clots or fill a toilet with blood following the procedure. This may also occur 10 to 14 days following the procedure. This is more likely if a biopsy was taken.  You develop abdominal pain that keeps getting worse and cannot be relieved with medicine. Document Released: 08/15/2000 Document Revised: 11/10/2011 Document Reviewed: 03/30/2008 [facility] Patient Information 2014 [place], [place].

## 2013-06-09 ENCOUNTER — Other Ambulatory Visit: Payer: Self-pay | Admitting: Gastroenterology

## 2013-06-09 ENCOUNTER — Other Ambulatory Visit: Payer: Self-pay

## 2013-06-09 ENCOUNTER — Encounter (HOSPITAL_COMMUNITY): Payer: Self-pay

## 2013-06-09 ENCOUNTER — Encounter (HOSPITAL_COMMUNITY)
Admission: RE | Admit: 2013-06-09 | Discharge: 2013-06-09 | Disposition: A | Discharge status: Home / Self Care | Payer: Medicare Other | Source: Ambulatory Visit | Attending: Gastroenterology | Admitting: Gastroenterology

## 2013-06-09 DIAGNOSIS — Z01818 Encounter for other preprocedural examination: Secondary | ICD-10-CM | POA: Insufficient documentation

## 2013-06-09 DIAGNOSIS — Z01812 Encounter for preprocedural laboratory examination: Secondary | ICD-10-CM | POA: Insufficient documentation

## 2013-06-09 DIAGNOSIS — Z01810 Encounter for preprocedural cardiovascular examination: Secondary | ICD-10-CM | POA: Insufficient documentation

## 2013-06-09 HISTORY — DX: Transient cerebral ischemic attack, unspecified: G45.9

## 2013-06-09 LAB — BASIC METABOLIC PANEL
BUN: 26 mg/dL — ABNORMAL HIGH (ref 6–23)
CO2: 32 mEq/L (ref 19–32)
Calcium: 10.2 mg/dL (ref 8.4–10.5)
Chloride: 101 mEq/L (ref 96–112)
Creatinine, Ser: 1.05 mg/dL (ref 0.50–1.10)
GFR calc Af Amer: 61 mL/min — ABNORMAL LOW (ref >90)
GFR calc non Af Amer: 53 mL/min — ABNORMAL LOW (ref >90)
Glucose, Bld: 169 mg/dL — ABNORMAL HIGH (ref 70–99)
Potassium: 4.4 mEq/L (ref 3.5–5.1)
Sodium: 141 mEq/L (ref 135–145)

## 2013-06-09 LAB — HEMOGLOBIN AND HEMATOCRIT, BLOOD
HCT: 41.5 % (ref 36.0–46.0)
Hemoglobin: 13.7 g/dL (ref 12.0–15.0)

## 2013-06-09 MED ORDER — PEG 3350-KCL-NA BICARB-NACL 420 G PO SOLR: 4000.0000 mL | ORAL | Status: DC

## 2013-06-16 ENCOUNTER — Ambulatory Visit (INDEPENDENT_AMBULATORY_CARE_PROVIDER_SITE_OTHER): Payer: Medicare Other | Admitting: Otolaryngology

## 2013-06-21 ENCOUNTER — Telehealth: Payer: Self-pay | Admitting: Gastroenterology

## 2013-06-21 ENCOUNTER — Encounter (HOSPITAL_COMMUNITY): Payer: Self-pay | Admitting: *Deleted

## 2013-06-21 ENCOUNTER — Encounter (HOSPITAL_COMMUNITY): Payer: Medicare Other | Admitting: Anesthesiology

## 2013-06-21 ENCOUNTER — Ambulatory Visit (HOSPITAL_COMMUNITY): Payer: Medicare Other | Admitting: Anesthesiology

## 2013-06-21 ENCOUNTER — Encounter (HOSPITAL_COMMUNITY)
Admission: RE | Disposition: A | Discharge status: Home / Self Care | Payer: Self-pay | Source: Ambulatory Visit | Attending: Gastroenterology

## 2013-06-21 ENCOUNTER — Ambulatory Visit (HOSPITAL_COMMUNITY)
Admission: RE | Admit: 2013-06-21 | Discharge: 2013-06-21 | Disposition: A | Discharge status: Home / Self Care | Payer: Medicare Other | Source: Ambulatory Visit | Attending: Gastroenterology | Admitting: Gastroenterology

## 2013-06-21 DIAGNOSIS — K648 Other hemorrhoids: Secondary | ICD-10-CM

## 2013-06-21 DIAGNOSIS — K5730 Diverticulosis of large intestine without perforation or abscess without bleeding: Secondary | ICD-10-CM

## 2013-06-21 DIAGNOSIS — Z01812 Encounter for preprocedural laboratory examination: Secondary | ICD-10-CM | POA: Insufficient documentation

## 2013-06-21 DIAGNOSIS — Z1211 Encounter for screening for malignant neoplasm of colon: Secondary | ICD-10-CM | POA: Insufficient documentation

## 2013-06-21 DIAGNOSIS — J449 Chronic obstructive pulmonary disease, unspecified: Secondary | ICD-10-CM | POA: Insufficient documentation

## 2013-06-21 DIAGNOSIS — Z79899 Other long term (current) drug therapy: Secondary | ICD-10-CM | POA: Insufficient documentation

## 2013-06-21 DIAGNOSIS — Q438 Other specified congenital malformations of intestine: Secondary | ICD-10-CM | POA: Insufficient documentation

## 2013-06-21 DIAGNOSIS — J4489 Other specified chronic obstructive pulmonary disease: Secondary | ICD-10-CM | POA: Insufficient documentation

## 2013-06-21 DIAGNOSIS — IMO0002 Reserved for concepts with insufficient information to code with codable children: Secondary | ICD-10-CM | POA: Insufficient documentation

## 2013-06-21 HISTORY — PX: COLONOSCOPY WITH PROPOFOL: SHX5780

## 2013-06-21 LAB — GLUCOSE, CAPILLARY: Glucose-Capillary: 99 mg/dL (ref 70–99)

## 2013-06-21 SURGERY — COLONOSCOPY WITH PROPOFOL
Anesthesia: Monitor Anesthesia Care | Site: Rectum

## 2013-06-21 MED ORDER — FENTANYL CITRATE 0.05 MG/ML IJ SOLN
25.0000 ug | INTRAMUSCULAR | Status: AC
  Administered 2013-06-21 (×2): 25 ug via INTRAVENOUS
  Filled 2013-06-21: qty 2

## 2013-06-21 MED ORDER — LINACLOTIDE 145 MCG PO CAPS: ORAL_CAPSULE | ORAL | Status: DC

## 2013-06-21 MED ORDER — PROPOFOL 10 MG/ML IV BOLUS
INTRAVENOUS | Status: AC
  Filled 2013-06-21: qty 20

## 2013-06-21 MED ORDER — PROPOFOL INFUSION 10 MG/ML OPTIME
INTRAVENOUS | Status: DC | PRN
  Administered 2013-06-21: 75 ug/kg/min via INTRAVENOUS

## 2013-06-21 MED ORDER — LIDOCAINE HCL (PF) 1 % IJ SOLN
INTRAMUSCULAR | Status: AC
  Filled 2013-06-21: qty 5

## 2013-06-21 MED ORDER — STERILE WATER FOR IRRIGATION IR SOLN
Status: DC | PRN
  Administered 2013-06-21: 08:00:00

## 2013-06-21 MED ORDER — LACTATED RINGERS IV SOLN
INTRAVENOUS | Status: DC
  Administered 2013-06-21: 1000 mL via INTRAVENOUS

## 2013-06-21 MED ORDER — FENTANYL CITRATE 0.05 MG/ML IJ SOLN
INTRAMUSCULAR | Status: AC
  Filled 2013-06-21: qty 2

## 2013-06-21 MED ORDER — ONDANSETRON HCL 4 MG/2ML IJ SOLN
4.0000 mg | Freq: Once | INTRAMUSCULAR | Status: AC
  Administered 2013-06-21: 4 mg via INTRAVENOUS
  Filled 2013-06-21: qty 2

## 2013-06-21 MED ORDER — FENTANYL CITRATE 0.05 MG/ML IJ SOLN
INTRAMUSCULAR | Status: DC | PRN
  Administered 2013-06-21: 50 ug via INTRAVENOUS

## 2013-06-21 MED ORDER — WATER FOR IRRIGATION, STERILE IR SOLN
Status: DC | PRN
  Administered 2013-06-21: 2000 mL

## 2013-06-21 MED ORDER — LIDOCAINE HCL (CARDIAC) 20 MG/ML IV SOLN
INTRAVENOUS | Status: DC | PRN
  Administered 2013-06-21: 15 mg via INTRAVENOUS

## 2013-06-21 MED ORDER — MIDAZOLAM HCL 2 MG/2ML IJ SOLN
1.0000 mg | INTRAMUSCULAR | Status: DC | PRN
  Administered 2013-06-21: 2 mg via INTRAVENOUS

## 2013-06-21 MED ORDER — MIDAZOLAM HCL 2 MG/2ML IJ SOLN
INTRAMUSCULAR | Status: AC
  Filled 2013-06-21: qty 2

## 2013-06-21 SURGICAL SUPPLY — 22 items
CONT PRE FILLED 20ML (MISCELLANEOUS) ×2 IMPLANT
ELECT REM PT RETURN 9FT ADLT (ELECTROSURGICAL)
ELECTRODE REM PT RTRN 9FT ADLT (ELECTROSURGICAL) IMPLANT
FCP BXJMBJMB 240X2.8X (CUTTING FORCEPS)
FLOOR PAD 36X40 (MISCELLANEOUS) ×2
FORCEPS BIOP RAD 4 LRG CAP 4 (CUTTING FORCEPS) IMPLANT
FORCEPS BIOP RJ4 240 W/NDL (CUTTING FORCEPS)
FORCEPS BXJMBJMB 240X2.8X (CUTTING FORCEPS) IMPLANT
INJECTOR/SNARE I SNARE (MISCELLANEOUS) IMPLANT
LUBRICANT JELLY 4.5OZ STERILE (MISCELLANEOUS) ×2 IMPLANT
MANIFOLD NEPTUNE II (INSTRUMENTS) ×2 IMPLANT
NEEDLE SCLEROTHERAPY 25GX240 (NEEDLE) ×2 IMPLANT
PAD FLOOR 36X40 (MISCELLANEOUS) ×1 IMPLANT
PROBE APC STR FIRE (PROBE) IMPLANT
PROBE INJECTION GOLD (MISCELLANEOUS)
PROBE INJECTION GOLD 7FR (MISCELLANEOUS) IMPLANT
SNARE SHORT THROW 13M SML OVAL (MISCELLANEOUS) ×2 IMPLANT
SYR 50ML LL SCALE MARK (SYRINGE) ×2 IMPLANT
TRAP SPECIMEN MUCOUS 40CC (MISCELLANEOUS) ×2 IMPLANT
TUBING ENDO SMARTCAP PENTAX (MISCELLANEOUS) ×2 IMPLANT
TUBING IRRIGATION ENDOGATOR (MISCELLANEOUS) ×2 IMPLANT
WATER STERILE IRR 1000ML POUR (IV SOLUTION) ×4 IMPLANT

## 2013-06-21 NOTE — Transfer of Care (Signed)
Immediate Anesthesia Transfer of Care Note  Patient: Lindsay Mitchell  Procedure(s) Performed: Procedure(s) with comments: COLONOSCOPY WITH PROPOFOL (N/A) - in cecum at 0811; total time = 14 minutes  Patient Location: PACU  Anesthesia Type:MAC  Level of Consciousness: awake  Airway & Oxygen Therapy: Patient Spontanous Breathing  Post-op Assessment: Report given to PACU RN  Post vital signs: Reviewed  Complications: No apparent anesthesia complications

## 2013-06-21 NOTE — Anesthesia Preprocedure Evaluation (Signed)
Anesthesia Evaluation  Patient identified by MRN, date of birth, ID band Patient awake    Reviewed: Allergy & Precautions, H&P , NPO status , Patient's Chart, lab work & pertinent test results  Airway Mallampati: III TM Distance: >3 FB Neck ROM: Full    Dental  (+) Edentulous Lower and Implants   Pulmonary COPD COPD inhaler,  breath sounds clear to auscultation        Cardiovascular + Peripheral Vascular Disease and DVT Rhythm:Regular Rate:Normal     Neuro/Psych PSYCHIATRIC DISORDERS Anxiety Depression TIA   GI/Hepatic   Endo/Other    Renal/GU      Musculoskeletal   Abdominal   Peds  Hematology   Anesthesia Other Findings   Reproductive/Obstetrics                           Anesthesia Physical Anesthesia Plan  ASA: III  Anesthesia Plan: MAC   Post-op Pain Management:    Induction: Intravenous  Airway Management Planned: Simple Face Mask  Additional Equipment:   Intra-op Plan:   Post-operative Plan:   Informed Consent: I have reviewed the patients History and Physical, chart, labs and discussed the procedure including the risks, benefits and alternatives for the proposed anesthesia with the patient or authorized representative who has indicated his/her understanding and acceptance.     Plan Discussed with:   Anesthesia Plan Comments:         Anesthesia Quick Evaluation

## 2013-06-21 NOTE — H&P (Signed)
Primary Care Physician:  Cassell Smiles., MD Primary Gastroenterologist:  Dr. Darrick Penna  Pre-Procedure History & Physical: HPI:  Lindsay Mitchell is a 72 y.o. female here for COLON CANCER SCREENING.   Past Medical History  Diagnosis Date  . Constipation   . Depression with anxiety   . Diverticulosis   . DVT (deep venous thrombosis)   . COPD (chronic obstructive pulmonary disease)   . Carotid artery occlusion   . Anxiety   . TIA (transient ischemic attack)     Past Surgical History  Procedure Laterality Date  . Abdominal hysterectomy    . Back surgery      X3  . Colonoscopy  June 2002    Dr. Noe Gens: severe diverticular disease with haustral hypertrophy, primary and secondary diverticulosis, persistent spasticity, early stenosis    Prior to Admission medications   Medication Sig Start Date End Date Taking? Authorizing Provider  aspirin 325 MG tablet Take 325 mg by mouth daily.   Yes Historical Provider, MD  atorvastatin (LIPITOR) 10 MG tablet Take 1 tablet (10 mg total) by mouth daily. 05/09/13  Yes Ileana Ladd, MD  beclomethasone (QVAR) 40 MCG/ACT inhaler Inhale 2 puffs into the lungs daily.   Yes Historical Provider, MD  gabapentin (NEURONTIN) 300 MG capsule Take 300 mg by mouth at bedtime.   Yes Historical Provider, MD  ipratropium-albuterol (DUONEB) 0.5-2.5 (3) MG/3ML SOLN Take 3 mLs by nebulization every 6 (six) hours as needed. 04/12/13  Yes Ileana Ladd, MD  polyethylene glycol-electrolytes (TRILYTE) 420 G solution Take 4,000 mLs by mouth as directed. 06/01/13  Yes West Bali, MD  polyethylene glycol-electrolytes (TRILYTE) 420 G solution Take 4,000 mLs by mouth as directed. 06/09/13  Yes West Bali, MD  Linaclotide (LINZESS) 290 MCG CAPS capsule Take 1 capsule (290 mcg total) by mouth daily. 06/01/13   Nira Retort, NP  predniSONE (DELTASONE) 50 MG tablet Take 50 mg by mouth daily.    Historical Provider, MD    Allergies as of 06/01/2013 - Review Complete  06/01/2013  Allergen Reaction Noted  . Codeine  06/11/2012  . Talwin [pentazocine]  06/11/2012  . Valium [diazepam]  10/28/2012  . Vicodin [hydrocodone-acetaminophen]  10/28/2012    Family History  Problem Relation Age of Onset  . Colon cancer Neg Hx   . Cancer Mother   . Heart disease Mother   . Hypertension Mother   . Heart attack Mother   . Other Mother     varicose veins  . Diabetes Brother   . Hypertension Brother     History   Social History  . Marital Status: Married    Spouse Name: N/A    Number of Children: 4  . Years of Education: N/A   Occupational History  . Not on file.   Social History Main Topics  . Smoking status: Former Smoker -- 1.00 packs/day for 50 years    Types: Cigarettes    Quit date: 10/12/2012  . Smokeless tobacco: Not on file     Comment: smoked X 60 years  . Alcohol Use: No  . Drug Use: No  . Sexual Activity: Yes    Birth Control/ Protection: Surgical   Other Topics Concern  . Not on file   Social History Narrative  . No narrative on file    Review of Systems: See HPI, otherwise negative ROS   Physical Exam: BP 135/86  Pulse 85  Temp(Src) 97.7 F (36.5 C) (Oral)  Resp 16  SpO2  96% General:   Alert,  pleasant and cooperative in NAD Head:  Normocephalic and atraumatic. Neck:  Supple; Lungs:  Clear throughout to auscultation.    Heart:  Regular rate and rhythm. Abdomen:  Soft, nontender and nondistended. Normal bowel sounds, without guarding, and without rebound.   Neurologic:  Alert and  oriented x4;  grossly normal neurologically.  Impression/Plan:     SCREENING  Plan:  1. TCS TODAY

## 2013-06-21 NOTE — Progress Notes (Signed)
REVIEWED.  

## 2013-06-21 NOTE — Telephone Encounter (Signed)
Pt is switching pharmacies. Please call in her Linzess rx to [facility] in [place]. She is no longer using [facility].

## 2013-06-21 NOTE — Telephone Encounter (Signed)
Per Dr. Evelina Dun order today to [facility], Linzess 145 mcg  #30 one po qd 30 min prior to breakfast/first meal. 11 refills. Called the order to [facility] per pt request ( changing pharmacy's). LMOM at [facility] to cancel the order. Spoke to [place] at [facility].

## 2013-06-21 NOTE — Anesthesia Postprocedure Evaluation (Addendum)
  Anesthesia Post-op Note  Patient: Lindsay Mitchell  Procedure(s) Performed: Procedure(s) with comments: COLONOSCOPY WITH PROPOFOL (N/A) - in cecum at 0811; total time = 14 minutes  Patient Location: PACU  Anesthesia Type:MAC  Level of Consciousness: awake, alert  and oriented  Airway and Oxygen Therapy: Patient Spontanous Breathing  Post-op Pain: none  Post-op Assessment: Post-op Vital signs reviewed, Patient's Cardiovascular Status Stable, Respiratory Function Stable, Patent Airway and No signs of Nausea or vomiting  Post-op Vital Signs: Reviewed and stable97/67, 76, 20, 100, 36.6  Complications: No apparent anesthesia complications

## 2013-06-22 ENCOUNTER — Encounter (HOSPITAL_COMMUNITY): Payer: Self-pay | Admitting: Gastroenterology

## 2013-06-22 NOTE — Op Note (Signed)
[facility] [address] [place], [address]   COLONOSCOPY PROCEDURE REPORT  PATIENT: Lindsay Mitchell, Lindsay Mitchell  MR#: [id number] BIRTHDATE: [birth year] , 72  yrs. old GENDER: Female ENDOSCOPIST: Jonette Eva, MD REFERRED BM:WUXLK Sherwood Gambler, M.D. PROCEDURE DATE:  06/21/2013 PROCEDURE:   Colonoscopy, screening INDICATIONS:Average risk patient for colon cancer.  PT HAS CHRONIC CONSTIPATION. MEDICATIONS: MAC sedation, administered by CRNA  DESCRIPTION OF PROCEDURE:    Physical exam was performed.  Informed consent was obtained from the patient after explaining the benefits, risks, and alternatives to procedure.  The patient was connected to monitor and placed in left lateral position. Continuous oxygen was provided by nasal cannula and IV medicine administered through an indwelling cannula.  After administration of sedation and rectal exam, the patients rectum was intubated and the     colonoscope was advanced under direct visualization to the ileum.  The scope was removed slowly by carefully examining the color, texture, anatomy, and integrity mucosa on the way out.  The patient was recovered in endoscopy and discharged home in satisfactory condition.    COLON FINDINGS: The mucosa appeared normal in the terminal ileum.  , There was moderate diverticulosis noted in the sigmoid colon and descending colon with associated muscular hypertrophy, luminal narrowing, angulation and tortuosity.  , A normal appearing cecum, ileocecal valve, and appendiceal orifice were identified.  The ascending, hepatic flexure, transverse, splenic flexure, descending, sigmoid colon and rectum appeared unremarkable.  No polyps or cancers were seen.  , The colon was redundant.  The patient was moved on to their back to reach the cecum.  Manual abdominal counter-pressure was used to reach the cecum, and Small internal hemorrhoids were found.  PREP QUALITY: good.  CECAL W/D TIME: 14 minutes      COMPLICATIONS: None  ENDOSCOPIC IMPRESSION: 1.   Normal mucosa in the terminal ileum 2.   Moderate diverticulosis in the sigmoid colon and descending colon 3.   Small internal hemorrhoids 4.   The colon IS Redundant AND ANGULATED AT THE RECTOSIGMOID JUNCTION(10 CM)  RECOMMENDATIONS: DRINK WATER TO KEEP YOUR URINE LIGHT YELLOW. Continue LINZESS.  Try 145 mcg daily. FOLLOW A HIGH FIBER DIET.  AVOID ITEMS THAT CAUSE BLOATING. Next colonoscopy/?OVERTUBE in 10-15 years if the benefits outweigh the risks.  FOLLOW UP IN 3 MOS.       _______________________________ Rosalie DoctorJonette Eva, MD 06/22/2013 4:13 PM     PATIENT NAME:  Lindsay Mitchell MR#: [id number]

## 2013-06-30 ENCOUNTER — Ambulatory Visit (INDEPENDENT_AMBULATORY_CARE_PROVIDER_SITE_OTHER): Payer: Medicare Other | Admitting: Otolaryngology

## 2013-06-30 DIAGNOSIS — J320 Chronic maxillary sinusitis: Secondary | ICD-10-CM

## 2013-06-30 DIAGNOSIS — J310 Chronic rhinitis: Secondary | ICD-10-CM

## 2013-07-07 ENCOUNTER — Ambulatory Visit: Payer: Medicare Other | Admitting: Family Medicine

## 2013-07-07 ENCOUNTER — Ambulatory Visit (INDEPENDENT_AMBULATORY_CARE_PROVIDER_SITE_OTHER): Payer: Medicare Other | Admitting: Otolaryngology

## 2013-07-07 DIAGNOSIS — J320 Chronic maxillary sinusitis: Secondary | ICD-10-CM

## 2013-07-07 DIAGNOSIS — J322 Chronic ethmoidal sinusitis: Secondary | ICD-10-CM

## 2013-07-08 ENCOUNTER — Encounter (HOSPITAL_BASED_OUTPATIENT_CLINIC_OR_DEPARTMENT_OTHER): Payer: Self-pay | Admitting: *Deleted

## 2013-07-08 NOTE — Progress Notes (Signed)
Copd-finished antibiotic  last week-dr Suszanne Conners started her on prednisone-no labs needed To bring all meds and neb tx meds

## 2013-07-11 ENCOUNTER — Encounter (HOSPITAL_BASED_OUTPATIENT_CLINIC_OR_DEPARTMENT_OTHER): Payer: Self-pay | Admitting: *Deleted

## 2013-07-11 ENCOUNTER — Ambulatory Visit (HOSPITAL_BASED_OUTPATIENT_CLINIC_OR_DEPARTMENT_OTHER)
Admission: RE | Admit: 2013-07-11 | Discharge: 2013-07-11 | Disposition: A | Discharge status: Home / Self Care | Payer: Medicare Other | Source: Ambulatory Visit | Attending: Otolaryngology | Admitting: Otolaryngology

## 2013-07-11 ENCOUNTER — Encounter (HOSPITAL_BASED_OUTPATIENT_CLINIC_OR_DEPARTMENT_OTHER): Payer: Medicare Other | Admitting: Anesthesiology

## 2013-07-11 ENCOUNTER — Ambulatory Visit (HOSPITAL_BASED_OUTPATIENT_CLINIC_OR_DEPARTMENT_OTHER): Payer: Medicare Other | Admitting: Anesthesiology

## 2013-07-11 ENCOUNTER — Encounter (HOSPITAL_BASED_OUTPATIENT_CLINIC_OR_DEPARTMENT_OTHER)
Admission: RE | Disposition: A | Discharge status: Home / Self Care | Payer: Self-pay | Source: Ambulatory Visit | Attending: Otolaryngology

## 2013-07-11 DIAGNOSIS — J322 Chronic ethmoidal sinusitis: Secondary | ICD-10-CM | POA: Insufficient documentation

## 2013-07-11 DIAGNOSIS — Z9889 Other specified postprocedural states: Secondary | ICD-10-CM

## 2013-07-11 DIAGNOSIS — J320 Chronic maxillary sinusitis: Secondary | ICD-10-CM | POA: Insufficient documentation

## 2013-07-11 HISTORY — DX: Complete loss of teeth, unspecified cause, unspecified class: K08.109

## 2013-07-11 HISTORY — DX: Presence of dental prosthetic device (complete) (partial): Z97.2

## 2013-07-11 HISTORY — PX: NASAL SINUS SURGERY: SHX719

## 2013-07-11 LAB — POCT HEMOGLOBIN-HEMACUE: Hemoglobin: 13.1 g/dL (ref 12.0–15.0)

## 2013-07-11 SURGERY — SINUS SURGERY, ENDOSCOPIC
Anesthesia: General | Site: Nose | Laterality: Right | Wound class: Clean Contaminated

## 2013-07-11 MED ORDER — FENTANYL CITRATE 0.05 MG/ML IJ SOLN: 50.0000 ug | INTRAMUSCULAR | Status: DC | PRN

## 2013-07-11 MED ORDER — SUCCINYLCHOLINE CHLORIDE 20 MG/ML IJ SOLN
INTRAMUSCULAR | Status: DC | PRN
  Administered 2013-07-11: 100 mg via INTRAVENOUS

## 2013-07-11 MED ORDER — LACTATED RINGERS IV SOLN
INTRAVENOUS | Status: DC
Start: 2013-07-11 — End: 2013-07-11
  Administered 2013-07-11 (×3): via INTRAVENOUS

## 2013-07-11 MED ORDER — DEXMEDETOMIDINE HCL 200 MCG/2ML IV SOLN
INTRAVENOUS | Status: DC | PRN
  Administered 2013-07-11: 28 ug via INTRAVENOUS

## 2013-07-11 MED ORDER — LIDOCAINE-EPINEPHRINE 1 %-1:100000 IJ SOLN
INTRAMUSCULAR | Status: AC
  Filled 2013-07-11: qty 1

## 2013-07-11 MED ORDER — BACITRACIN ZINC 500 UNIT/GM EX OINT
TOPICAL_OINTMENT | CUTANEOUS | Status: DC | PRN
  Administered 2013-07-11: 1 via TOPICAL

## 2013-07-11 MED ORDER — HYDROMORPHONE HCL PF 1 MG/ML IJ SOLN
INTRAMUSCULAR | Status: AC
  Filled 2013-07-11: qty 1

## 2013-07-11 MED ORDER — MIDAZOLAM HCL 2 MG/2ML IJ SOLN: 0.5000 mg | Freq: Once | INTRAMUSCULAR | Status: DC | PRN

## 2013-07-11 MED ORDER — OXYCODONE-ACETAMINOPHEN 5-325 MG PO TABS
1.0000 | ORAL_TABLET | ORAL | Status: DC | PRN
  Administered 2013-07-11: 1 via ORAL

## 2013-07-11 MED ORDER — PROPOFOL 10 MG/ML IV BOLUS
INTRAVENOUS | Status: AC
  Filled 2013-07-11: qty 40

## 2013-07-11 MED ORDER — LIDOCAINE HCL (CARDIAC) 20 MG/ML IV SOLN
INTRAVENOUS | Status: DC | PRN
  Administered 2013-07-11: 30 mg via INTRAVENOUS

## 2013-07-11 MED ORDER — ONDANSETRON HCL 4 MG/2ML IJ SOLN
INTRAMUSCULAR | Status: DC | PRN
  Administered 2013-07-11: 4 mg via INTRAVENOUS

## 2013-07-11 MED ORDER — MIDAZOLAM HCL 2 MG/2ML IJ SOLN: 1.0000 mg | INTRAMUSCULAR | Status: DC | PRN

## 2013-07-11 MED ORDER — DEXAMETHASONE SODIUM PHOSPHATE 4 MG/ML IJ SOLN
INTRAMUSCULAR | Status: DC | PRN
  Administered 2013-07-11: 10 mg via INTRAVENOUS

## 2013-07-11 MED ORDER — GLYCOPYRROLATE 0.2 MG/ML IJ SOLN
INTRAMUSCULAR | Status: DC | PRN
  Administered 2013-07-11: 0.2 mg via INTRAVENOUS

## 2013-07-11 MED ORDER — MUPIROCIN 2 % EX OINT
TOPICAL_OINTMENT | CUTANEOUS | Status: AC
  Filled 2013-07-11: qty 22

## 2013-07-11 MED ORDER — PROPOFOL 10 MG/ML IV BOLUS
INTRAVENOUS | Status: DC | PRN
  Administered 2013-07-11: 90 mg via INTRAVENOUS

## 2013-07-11 MED ORDER — OXYCODONE-ACETAMINOPHEN 5-325 MG PO TABS: 1.0000 | ORAL_TABLET | ORAL | Status: DC | PRN

## 2013-07-11 MED ORDER — HYDROMORPHONE HCL PF 1 MG/ML IJ SOLN
0.2500 mg | INTRAMUSCULAR | Status: DC | PRN
  Administered 2013-07-11: 0.5 mg via INTRAVENOUS
  Administered 2013-07-11 (×3): 0.25 mg via INTRAVENOUS
  Administered 2013-07-11: 0.5 mg via INTRAVENOUS

## 2013-07-11 MED ORDER — OXYCODONE-ACETAMINOPHEN 5-325 MG PO TABS
ORAL_TABLET | ORAL | Status: AC
  Filled 2013-07-11: qty 1

## 2013-07-11 MED ORDER — LIDOCAINE-EPINEPHRINE 1 %-1:100000 IJ SOLN
INTRAMUSCULAR | Status: DC | PRN
  Administered 2013-07-11: 1 mL

## 2013-07-11 MED ORDER — OXYMETAZOLINE HCL 0.05 % NA SOLN
NASAL | Status: DC | PRN
  Administered 2013-07-11: 1 via NASAL

## 2013-07-11 MED ORDER — FENTANYL CITRATE 0.05 MG/ML IJ SOLN
INTRAMUSCULAR | Status: AC
  Filled 2013-07-11: qty 4

## 2013-07-11 MED ORDER — BACITRACIN ZINC 500 UNIT/GM EX OINT
TOPICAL_OINTMENT | CUTANEOUS | Status: AC
  Filled 2013-07-11: qty 28.35

## 2013-07-11 MED ORDER — AMOXICILLIN 875 MG PO TABS: 875.0000 mg | ORAL_TABLET | Freq: Two times a day (BID) | ORAL | Status: DC

## 2013-07-11 MED ORDER — FENTANYL CITRATE 0.05 MG/ML IJ SOLN
INTRAMUSCULAR | Status: DC | PRN
  Administered 2013-07-11 (×2): 50 ug via INTRAVENOUS

## 2013-07-11 MED ORDER — EPHEDRINE SULFATE 50 MG/ML IJ SOLN
INTRAMUSCULAR | Status: DC | PRN
  Administered 2013-07-11: 10 mg via INTRAVENOUS

## 2013-07-11 MED ORDER — OXYMETAZOLINE HCL 0.05 % NA SOLN
NASAL | Status: AC
  Filled 2013-07-11: qty 15

## 2013-07-11 SURGICAL SUPPLY — 38 items
ATTRACTOMAT 16X20 MAGNETIC DRP (DRAPES) ×2 IMPLANT
BLADE RAD40 ROTATE 4M 4 5PK (BLADE) IMPLANT
BLADE RAD60 ROTATE M4 4 5PK (BLADE) IMPLANT
BLADE TRICUT ROTATE M4 4 5PK (BLADE) IMPLANT
BUR HS RAD FRONTAL 3 (BURR) IMPLANT
CANISTER SUC SOCK COL 7 IN (MISCELLANEOUS) ×2 IMPLANT
CANISTER SUCT 1200ML W/VALVE (MISCELLANEOUS) ×2 IMPLANT
COAGULATOR SUCT 8FR VV (MISCELLANEOUS) ×2 IMPLANT
COAGULATOR SUCT SWTCH 10FR 6 (ELECTROSURGICAL) IMPLANT
DECANTER SPIKE VIAL GLASS SM (MISCELLANEOUS) IMPLANT
DRSG NASAL KENNEDY LMNT 8CM (GAUZE/BANDAGES/DRESSINGS) IMPLANT
DRSG NASOPORE 8CM (GAUZE/BANDAGES/DRESSINGS) ×2 IMPLANT
DRSG TELFA 3X8 NADH (GAUZE/BANDAGES/DRESSINGS) IMPLANT
ELECT REM PT RETURN 9FT ADLT (ELECTROSURGICAL) ×2
ELECTRODE REM PT RTRN 9FT ADLT (ELECTROSURGICAL) ×1 IMPLANT
GLOVE BIO SURGEON STRL SZ7.5 (GLOVE) ×2 IMPLANT
GLOVE ECLIPSE 6.5 STRL STRAW (GLOVE) ×2 IMPLANT
GOWN PREVENTION PLUS XLARGE (GOWN DISPOSABLE) ×4 IMPLANT
HEMOSTAT SURGICEL 2X14 (HEMOSTASIS) IMPLANT
IV NS 500ML (IV SOLUTION)
IV NS 500ML BAXH (IV SOLUTION) IMPLANT
NEEDLE HYPO 25X1 1.5 SAFETY (NEEDLE) ×2 IMPLANT
NEEDLE SPNL 25GX3.5 QUINCKE BL (NEEDLE) ×2 IMPLANT
NS IRRIG 1000ML POUR BTL (IV SOLUTION) ×2 IMPLANT
PACK BASIN DAY SURGERY FS (CUSTOM PROCEDURE TRAY) ×2 IMPLANT
PACK ENT DAY SURGERY (CUSTOM PROCEDURE TRAY) ×2 IMPLANT
SET EXT MALE ROTATING LL 32IN (MISCELLANEOUS) IMPLANT
SLEEVE SCD COMPRESS KNEE MED (MISCELLANEOUS) ×2 IMPLANT
SOLUTION BUTLER CLEAR DIP (MISCELLANEOUS) ×2 IMPLANT
SPLINT NASAL DOYLE BI-VL (GAUZE/BANDAGES/DRESSINGS) IMPLANT
SPONGE GAUZE 2X2 8PLY STRL LF (GAUZE/BANDAGES/DRESSINGS) ×2 IMPLANT
SPONGE NEURO XRAY DETECT 1X3 (DISPOSABLE) ×2 IMPLANT
SUT ETHILON 3 0 PS 1 (SUTURE) IMPLANT
SUT PLAIN 4 0 ~~LOC~~ 1 (SUTURE) IMPLANT
TOWEL OR 17X24 6PK STRL BLUE (TOWEL DISPOSABLE) ×2 IMPLANT
TUBE CONNECTING 20X1/4 (TUBING) ×2 IMPLANT
TUBE SALEM SUMP 16 FR W/ARV (TUBING) IMPLANT
YANKAUER SUCT BULB TIP NO VENT (SUCTIONS) ×2 IMPLANT

## 2013-07-11 NOTE — Anesthesia Procedure Notes (Signed)
Procedure Name: Intubation Date/Time: 07/11/2013 10:27 AM Performed by: Gar Gibbon Pre-anesthesia Checklist: Patient identified, Emergency Drugs available, Suction available and Patient being monitored Patient Re-evaluated:Patient Re-evaluated prior to inductionOxygen Delivery Method: Circle System Utilized Preoxygenation: Pre-oxygenation with 100% oxygen Intubation Type: IV induction Ventilation: Mask ventilation without difficulty Laryngoscope Size: Mac and 3 Grade View: Grade II Tube type: Oral Rae Tube size: 7.0 mm Number of attempts: 1 Airway Equipment and Method: stylet and oral airway Placement Confirmation: ETT inserted through vocal cords under direct vision,  positive ETCO2 and breath sounds checked- equal and bilateral Tube secured with: Tape Dental Injury: Teeth and Oropharynx as per pre-operative assessment

## 2013-07-11 NOTE — Anesthesia Postprocedure Evaluation (Signed)
  Anesthesia Post-op Note  Patient: Lindsay Mitchell  Procedure(s) Performed: Procedure(s): RIGHT ENDOSCOPIC ANTERIOR ETHMOIDECTOMY/RIGHT ENDOSCOPIC MAXILLARY ANTROSTOMY WITH REMOVAL OF TISSUE (Right)  Patient Location: PACU  Anesthesia Type:General  Level of Consciousness: awake, alert  and oriented  Airway and Oxygen Therapy: Patient Spontanous Breathing  Post-op Pain: mild  Post-op Assessment: Post-op Vital signs reviewed  Post-op Vital Signs: Reviewed  Complications: No apparent anesthesia complications

## 2013-07-11 NOTE — Anesthesia Preprocedure Evaluation (Signed)
Anesthesia Evaluation  Patient identified by MRN, date of birth, ID band Patient awake    Reviewed: Allergy & Precautions, H&P , NPO status , Patient's Chart, lab work & pertinent test results  Airway Mallampati: II TM Distance: >3 FB Neck ROM: Full    Dental  (+) Edentulous Upper and Edentulous Lower   Pulmonary    + decreased breath sounds      Cardiovascular Rhythm:Regular Rate:Normal     Neuro/Psych    GI/Hepatic   Endo/Other    Renal/GU      Musculoskeletal   Abdominal   Peds  Hematology   Anesthesia Other Findings   Reproductive/Obstetrics                           Anesthesia Physical Anesthesia Plan  ASA: III  Anesthesia Plan: General   Post-op Pain Management:    Induction: Intravenous  Airway Management Planned: Oral ETT  Additional Equipment:   Intra-op Plan:   Post-operative Plan:   Informed Consent: I have reviewed the patients History and Physical, chart, labs and discussed the procedure including the risks, benefits and alternatives for the proposed anesthesia with the patient or authorized representative who has indicated his/her understanding and acceptance.   Dental advisory given  Plan Discussed with: CRNA and Anesthesiologist  Anesthesia Plan Comments: (Chronic Sinusitis COPD  Plan GA with oral ETT  Kipp Brood, MD)        Anesthesia Quick Evaluation

## 2013-07-11 NOTE — Brief Op Note (Signed)
07/11/2013  11:09 AM  PATIENT:  Lindsay Mitchell  72 y.o. female  PRE-OPERATIVE DIAGNOSIS:  ethmoid/maxillary sinusitis  POST-OPERATIVE DIAGNOSIS:  ethmoid/maxillary sinusitis  PROCEDURE:  Procedure(s): RIGHT ENDOSCOPIC MAXILLARY ANTROSTOMY WITH REMOVAL OF TISSUE  RIGHT ENDOSCOPIC ANTERIOR ETHMOIDECTOMY  SURGEON:  Surgeon(s) and Role:    * Sui W Aicia Babinski, MD - Primary  PHYSICIAN ASSISTANT:   ASSISTANTS: none   ANESTHESIA:   general  EBL:  Total I/O In: 1000 [I.V.:1000] Out: -   BLOOD ADMINISTERED:none  DRAINS: none   LOCAL MEDICATIONS USED:  LIDOCAINE   SPECIMEN:  Source of Specimen:  right sinus content  DISPOSITION OF SPECIMEN:  PATHOLOGY  COUNTS:  YES  TOURNIQUET:  * No tourniquets in log *  DICTATION: .Other Dictation: Dictation Number [id number]  PLAN OF CARE: Discharge to home after PACU  PATIENT DISPOSITION:  PACU - hemodynamically stable.   Delay start of Pharmacological VTE agent (>24hrs) due to surgical blood loss or risk of bleeding: not applicable

## 2013-07-11 NOTE — H&P (Signed)
  H&P Update  Pt's original H&P dated 06/30/13 reviewed and placed in chart (to be scanned).  I personally examined the patient today.  No change in health. Proceed with right endoscopic sinus surgery.

## 2013-07-11 NOTE — Transfer of Care (Signed)
Immediate Anesthesia Transfer of Care Note  Patient: Lindsay Mitchell  Procedure(s) Performed: Procedure(s): RIGHT ENDOSCOPIC ANTERIOR ETHMOIDECTOMY/RIGHT ENDOSCOPIC MAXILLARY ANTROSTOMY WITH REMOVAL OF TISSUE (Right)  Patient Location: PACU  Anesthesia Type:General  Level of Consciousness: awake, sedated and patient cooperative  Airway & Oxygen Therapy: Patient Spontanous Breathing and aerosol face mask  Post-op Assessment: Report given to PACU RN and Post -op Vital signs reviewed and stable  Post vital signs: Reviewed and stable  Complications: No apparent anesthesia complications

## 2013-07-12 ENCOUNTER — Encounter (HOSPITAL_BASED_OUTPATIENT_CLINIC_OR_DEPARTMENT_OTHER): Payer: Self-pay | Admitting: Otolaryngology

## 2013-07-12 NOTE — Op Note (Signed)
Lindsay Mitchell, Lindsay Mitchell               ACCOUNT NO.:  [id number]  MEDICAL RECORD NO.:  [id number]  LOCATION:                                 FACILITY:  PHYSICIAN:  Newman Pies, MD            DATE OF BIRTH:  06-02-1941  DATE OF PROCEDURE:  07/11/2013 DATE OF DISCHARGE:                              OPERATIVE REPORT   SURGEON:  Newman Pies, MD  PREOPERATIVE DIAGNOSES: 1. Right silent sinus syndrome. 2. Chronic right maxillary sinusitis. 3. Chronic right anterior ethmoid sinusitis.  POSTOPERATIVE DIAGNOSES: 1. Right silent sinus syndrome. 2. Chronic right maxillary sinusitis. 3. Chronic right anterior ethmoid sinusitis.  PROCEDURE PERFORMED: 1. Right maxillary antrostomy with tissue removal. 2. Right endoscopic anterior ethmoidectomy.  ANESTHESIA:  General endotracheal tube anesthesia.  COMPLICATIONS:  None.  ESTIMATED BLOOD LOSS:  Minimal.  INDICATION FOR PROCEDURE:  The patient is a 72 year old female with a history of chronic right facial and periorbital pain.  On her MRI and CT scan, she was noted to have opacification of the right maxillary sinus, and mucosal edema of the right anterior ethmoid sinus.  Her right maxillary sinus was noted to be severely atelectatic.  Her clinical history and the radiologic findings are consistent with right-sided silent sinus syndrome.  Based on the above findings, the decision was made for the patient to undergo the above-stated procedure.  The risks, benefits, alternatives, and details of the procedure were discussed with the patient.  It should be noted that the patient was previously treated medically with multiple antibiotics and steroid, without significant improvement in her symptoms.  The patient would like to proceed with the above-stated procedures.  Informed consent was obtained.  DESCRIPTION:  The patient was taken to the operating room and placed supine on the operating table.  General endotracheal tube anesthesia was administered by  the anesthesiologist.  The patient was positioned and prepped and draped in the standard fashion for right-sided sinus surgery.  Pledgets soaked with Afrin were placed in the right nasal cavity.  The pledgets were subsequently removed.  Endoscopic evaluation of the right nasal cavity revealed significantly enlarged right middle turbinate.  The right middle turbinate was carefully medialized.  The inferior portion of the right middle turbinate was also resected, in order to facilitate approach to the sinuses.  Severely atelectatic right lateral nasal wall was noted.  The maxillary opening was opened with a combination of backbiting forceps, Tru-Cut forceps, and maxillary sinus probe.  A large amount of mucopurulent drainage was suctioned and evacuated from the right maxillary sinus.  A polypoid tissue was also removed from the right maxillary sinus.  The right anterior ethmoid cavity was then opened with Tru-Cut forceps.  A small amount of polypoid tissue was removed from the anterior ethmoid cavity.  Hemostasis was achieved with suction electrocautery.  The sinuses were copiously irrigated.  A NASOPORE dressing was placed.  The care of the patient was turned over to the anesthesiologist.  The patient was awakened from anesthesia without difficulty.  She was extubated and transferred to the recovery room in good condition.  OPERATIVE FINDINGS:  Right chronic maxillary and ethmoid sinusitis, with polypoid  tissue and mucopurulent fluid in the right maxillary sinus.  SPECIMEN:  Right sinus contents.  FOLLOWUP CARE:  The patient will be discharged home once she is awake and alert.  She will be placed on amoxicillin 875 mg p.o. b.i.d. for 5 days, and Percocet p.r.n. pain.  The patient will follow up in my office in 3 weeks for debridement of her nasal cavities.     Newman Pies, MD     ST/MEDQ  D:  07/11/2013  T:  07/12/2013  Job:  [id number]

## 2013-07-14 ENCOUNTER — Ambulatory Visit (INDEPENDENT_AMBULATORY_CARE_PROVIDER_SITE_OTHER): Payer: Medicare Other | Admitting: Otolaryngology

## 2013-07-14 ENCOUNTER — Ambulatory Visit: Payer: Medicare Other | Admitting: Family Medicine

## 2013-07-14 DIAGNOSIS — J320 Chronic maxillary sinusitis: Secondary | ICD-10-CM

## 2013-07-14 DIAGNOSIS — J322 Chronic ethmoidal sinusitis: Secondary | ICD-10-CM

## 2013-07-20 ENCOUNTER — Emergency Department (HOSPITAL_COMMUNITY): Payer: Medicare Other

## 2013-07-20 ENCOUNTER — Emergency Department (HOSPITAL_COMMUNITY)
Admission: EM | Admit: 2013-07-20 | Discharge: 2013-07-20 | Disposition: A | Discharge status: Home / Self Care | Payer: Medicare Other | Attending: Emergency Medicine | Admitting: Emergency Medicine

## 2013-07-20 ENCOUNTER — Encounter (HOSPITAL_COMMUNITY): Payer: Self-pay | Admitting: Emergency Medicine

## 2013-07-20 DIAGNOSIS — R350 Frequency of micturition: Secondary | ICD-10-CM | POA: Insufficient documentation

## 2013-07-20 DIAGNOSIS — R531 Weakness: Secondary | ICD-10-CM

## 2013-07-20 DIAGNOSIS — Z8673 Personal history of transient ischemic attack (TIA), and cerebral infarction without residual deficits: Secondary | ICD-10-CM | POA: Insufficient documentation

## 2013-07-20 DIAGNOSIS — R252 Cramp and spasm: Secondary | ICD-10-CM | POA: Insufficient documentation

## 2013-07-20 DIAGNOSIS — M542 Cervicalgia: Secondary | ICD-10-CM | POA: Insufficient documentation

## 2013-07-20 DIAGNOSIS — Z86718 Personal history of other venous thrombosis and embolism: Secondary | ICD-10-CM | POA: Insufficient documentation

## 2013-07-20 DIAGNOSIS — R51 Headache: Secondary | ICD-10-CM | POA: Insufficient documentation

## 2013-07-20 DIAGNOSIS — J449 Chronic obstructive pulmonary disease, unspecified: Secondary | ICD-10-CM | POA: Insufficient documentation

## 2013-07-20 DIAGNOSIS — M25569 Pain in unspecified knee: Secondary | ICD-10-CM | POA: Insufficient documentation

## 2013-07-20 DIAGNOSIS — Z87891 Personal history of nicotine dependence: Secondary | ICD-10-CM | POA: Insufficient documentation

## 2013-07-20 DIAGNOSIS — F341 Dysthymic disorder: Secondary | ICD-10-CM | POA: Insufficient documentation

## 2013-07-20 DIAGNOSIS — J4489 Other specified chronic obstructive pulmonary disease: Secondary | ICD-10-CM | POA: Insufficient documentation

## 2013-07-20 DIAGNOSIS — IMO0002 Reserved for concepts with insufficient information to code with codable children: Secondary | ICD-10-CM | POA: Insufficient documentation

## 2013-07-20 DIAGNOSIS — Z8679 Personal history of other diseases of the circulatory system: Secondary | ICD-10-CM | POA: Insufficient documentation

## 2013-07-20 DIAGNOSIS — R110 Nausea: Secondary | ICD-10-CM | POA: Insufficient documentation

## 2013-07-20 DIAGNOSIS — Z792 Long term (current) use of antibiotics: Secondary | ICD-10-CM | POA: Insufficient documentation

## 2013-07-20 DIAGNOSIS — R5381 Other malaise: Secondary | ICD-10-CM | POA: Insufficient documentation

## 2013-07-20 DIAGNOSIS — K5900 Constipation, unspecified: Secondary | ICD-10-CM | POA: Insufficient documentation

## 2013-07-20 DIAGNOSIS — Z7982 Long term (current) use of aspirin: Secondary | ICD-10-CM | POA: Insufficient documentation

## 2013-07-20 DIAGNOSIS — Z79899 Other long term (current) drug therapy: Secondary | ICD-10-CM | POA: Insufficient documentation

## 2013-07-20 LAB — COMPREHENSIVE METABOLIC PANEL
ALT: 18 U/L (ref 0–35)
AST: 19 U/L (ref 0–37)
Albumin: 3.2 g/dL — ABNORMAL LOW (ref 3.5–5.2)
Alkaline Phosphatase: 88 U/L (ref 39–117)
BUN: 19 mg/dL (ref 6–23)
CO2: 31 mEq/L (ref 19–32)
Calcium: 9.6 mg/dL (ref 8.4–10.5)
Chloride: 99 mEq/L (ref 96–112)
Creatinine, Ser: 0.75 mg/dL (ref 0.50–1.10)
GFR calc Af Amer: >90 mL/min (ref >90)
GFR calc non Af Amer: 84 mL/min — ABNORMAL LOW (ref >90)
Glucose, Bld: 100 mg/dL — ABNORMAL HIGH (ref 70–99)
Potassium: 4.3 mEq/L (ref 3.5–5.1)
Sodium: 139 mEq/L (ref 135–145)
Total Bilirubin: 0.3 mg/dL (ref 0.3–1.2)
Total Protein: 6.9 g/dL (ref 6.0–8.3)

## 2013-07-20 LAB — CBC WITH DIFFERENTIAL/PLATELET
Basophils Absolute: 0 10*3/uL (ref 0.0–0.1)
Basophils Relative: 0 % (ref 0–1)
Eosinophils Absolute: 0.1 10*3/uL (ref 0.0–0.7)
Eosinophils Relative: 1 % (ref 0–5)
HCT: 40.9 % (ref 36.0–46.0)
Hemoglobin: 13.2 g/dL (ref 12.0–15.0)
Lymphocytes Relative: 15 % (ref 12–46)
Lymphs Abs: 1.6 10*3/uL (ref 0.7–4.0)
MCH: 31 pg (ref 26.0–34.0)
MCHC: 32.3 g/dL (ref 30.0–36.0)
MCV: 96 fL (ref 78.0–100.0)
Monocytes Absolute: 1.3 10*3/uL — ABNORMAL HIGH (ref 0.1–1.0)
Monocytes Relative: 12 % (ref 3–12)
Neutro Abs: 7.7 10*3/uL (ref 1.7–7.7)
Neutrophils Relative %: 71 % (ref 43–77)
Platelets: 254 10*3/uL (ref 150–400)
RBC: 4.26 MIL/uL (ref 3.87–5.11)
RDW: 13 % (ref 11.5–15.5)
WBC: 10.7 10*3/uL — ABNORMAL HIGH (ref 4.0–10.5)

## 2013-07-20 LAB — URINALYSIS, ROUTINE W REFLEX MICROSCOPIC
Bilirubin Urine: NEGATIVE
Glucose, UA: NEGATIVE mg/dL
Ketones, ur: NEGATIVE mg/dL
Leukocytes, UA: NEGATIVE
Nitrite: NEGATIVE
Protein, ur: NEGATIVE mg/dL
Specific Gravity, Urine: 1.01 (ref 1.005–1.030)
Urobilinogen, UA: 0.2 mg/dL (ref 0.0–1.0)
pH: 5.5 (ref 5.0–8.0)

## 2013-07-20 LAB — TROPONIN I: Troponin I: <0.3 ng/mL (ref <0.30)

## 2013-07-20 LAB — URINE MICROSCOPIC-ADD ON

## 2013-07-20 MED ORDER — SODIUM CHLORIDE 0.9 % IV BOLUS (SEPSIS)
1000.0000 mL | Freq: Once | INTRAVENOUS | Status: AC
  Administered 2013-07-20: 1000 mL via INTRAVENOUS

## 2013-07-20 MED ORDER — MECLIZINE HCL 25 MG PO TABS: 25.0000 mg | ORAL_TABLET | Freq: Three times a day (TID) | ORAL | Status: DC | PRN

## 2013-07-20 NOTE — ED Notes (Signed)
Pt c/o dizziness, nausea, and generalized body aches for 5 or 6 days.  Reports had sinus surgery approx 1 week ago.  Pt says wasn't feeling "good" prior to her surgery.

## 2013-07-20 NOTE — ED Notes (Signed)
Pt c/o generalized weakness, generalized body aches, nausea x5-6 days.

## 2013-07-20 NOTE — ED Provider Notes (Signed)
CSN: [id number]     Arrival date & time 07/20/13  0820 History  This chart was scribed for Donnetta Hutching, MD by Valera Castle, ED Scribe. This patient was seen in room APA05/APA05 and the patient's care was started at 8:32 AM.   Chief Complaint  Patient presents with  . Dizziness    (Consider location/radiation/quality/duration/timing/severity/associated sxs/prior Treatment) The history is provided by the patient and the spouse. No language interpreter was used.   HPI Comments: DOXIE AUGENSTEIN is a 72 y.o. female brought in by her husband who presents to the Emergency Department complaining of sudden, severe, constant weakness, with associated dizziness, onset 5-6 days ago. She also reports associated headache, muscle cramping, nausea, neck pain, and bilateral knee pain. Her husband reports she looked very pale yesterday. She denies having slept last night. She reports being ambulatory, but has been resting mostly throughout the days. She reports last week she was drinking a lot of fluids, with some urinary frequency, but states that over the past few days she has not had much to drink and has not urinated very much, if at all. She reports having nasal sinus surgery 9 days ago. She states she f/u with doctor and was okay. She reports h/o smoking. She denies any other associated symptoms. She has a h/o COPD, TIA, and DVT.   PCP -Cassell Smiles., MD  Past Medical History  Diagnosis Date  . Constipation   . Depression with anxiety   . Diverticulosis   . DVT (deep venous thrombosis)   . COPD (chronic obstructive pulmonary disease)   . Carotid artery occlusion   . Anxiety   . TIA (transient ischemic attack)   . Full dentures    Past Surgical History  Procedure Laterality Date  . Abdominal hysterectomy    . Back surgery      X3  . Colonoscopy  June 2002    Dr. Noe Gens: severe diverticular disease with haustral hypertrophy, primary and secondary diverticulosis, persistent spasticity, early  stenosis  . Colonoscopy with propofol N/A 06/21/2013    Procedure: COLONOSCOPY WITH PROPOFOL;  Surgeon: West Bali, MD;  Location: [facility];  Service: Endoscopy;  Laterality: N/A;  in cecum at 0811; total time = 14 minutes  . Tonsillectomy    . Appendectomy    . Eye surgery      both cataracts-lenses  . Nasal sinus surgery Right 07/11/2013    Procedure: RIGHT ENDOSCOPIC ANTERIOR ETHMOIDECTOMY/RIGHT ENDOSCOPIC MAXILLARY ANTROSTOMY WITH REMOVAL OF TISSUE;  Surgeon: Darletta Moll, MD;  Location: [facility];  Service: ENT;  Laterality: Right;   Family History  Problem Relation Age of Onset  . Colon cancer Neg Hx   . Cancer Mother   . Heart disease Mother   . Hypertension Mother   . Heart attack Mother   . Other Mother     varicose veins  . Diabetes Brother   . Hypertension Brother    History  Substance Use Topics  . Smoking status: Former Smoker -- 1.00 packs/day for 50 years    Types: Cigarettes    Quit date: 10/12/2012  . Smokeless tobacco: Not on file     Comment: smoked X 60 years  . Alcohol Use: No   OB History   Grav Para Term Preterm Abortions TAB SAB Ect Mult Living                 Review of Systems A complete 10 system review of systems was obtained and all  systems are negative except as noted in the HPI and PMH.   Allergies  Codeine; Talwin; and Valium  Home Medications   Current Outpatient Rx  Name  Route  Sig  Dispense  Refill  . aspirin EC 325 MG tablet   Oral   Take 325 mg by mouth daily.         Marland Kitchen atorvastatin (LIPITOR) 10 MG tablet   Oral   Take 1 tablet (10 mg total) by mouth daily.   30 tablet   5   . beclomethasone (QVAR) 40 MCG/ACT inhaler   Inhalation   Inhale 2 puffs into the lungs daily.         Marland Kitchen gabapentin (NEURONTIN) 300 MG capsule   Oral   Take 300 mg by mouth at bedtime.         Marland Kitchen ipratropium-albuterol (DUONEB) 0.5-2.5 (3) MG/3ML SOLN   Nebulization   Take 3 mLs by nebulization every 6 (six) hours as needed  (shortness of breath or wheezing).         . Linaclotide (LINZESS) 145 MCG CAPS capsule   Oral   Take 145 mcg by mouth at bedtime.         Marland Kitchen amoxicillin (AMOXIL) 875 MG tablet   Oral   Take 1 tablet (875 mg total) by mouth 2 (two) times daily.   10 tablet   0   . oxyCODONE-acetaminophen (PERCOCET/ROXICET) 5-325 MG per tablet   Oral   Take 1 tablet by mouth every 4 (four) hours as needed for moderate pain or severe pain.   20 tablet   0    Triage Vitals: BP 138/64  Pulse 77  Temp(Src) 97.8 F (36.6 C) (Oral)  Resp 20  Ht 5\' 4"  (1.626 m)  Wt 110 lb (49.896 kg)  BMI 18.87 kg/m2  SpO2 98%  Physical Exam  Nursing note and vitals reviewed. Constitutional: She is oriented to person, place, and time. She appears well-developed and well-nourished.  HENT:  Head: Normocephalic and atraumatic.  Eyes: Conjunctivae and EOM are normal. Pupils are equal, round, and reactive to light.  Neck: Normal range of motion. Neck supple.  Cardiovascular: Normal rate, regular rhythm and normal heart sounds.   Pulmonary/Chest: Effort normal and breath sounds normal.  Abdominal: Soft. Bowel sounds are normal.  nontender  Genitourinary:  Normal genitalia  Musculoskeletal: Normal range of motion.  Neurological: She is alert and oriented to person, place, and time.  Skin: Skin is warm and dry.  Pink color  Psychiatric: She has a normal mood and affect. Her behavior is normal.    ED Course  Procedures (including critical care time)  DIAGNOSTIC STUDIES: Oxygen Saturation is 98% on room air, normal by my interpretation.    COORDINATION OF CARE: 8:36 AM-Discussed treatment plan which includes IV fluids, UA with pt at bedside and pt agreed to plan.   Labs Review Labs Reviewed  COMPREHENSIVE METABOLIC PANEL - Abnormal; Notable for the following:    Glucose, Bld 100 (*)    Albumin 3.2 (*)    GFR calc non Af Amer 84 (*)    All other components within normal limits  CBC WITH DIFFERENTIAL -  Abnormal; Notable for the following:    WBC 10.7 (*)    Monocytes Absolute 1.3 (*)    All other components within normal limits  URINALYSIS, ROUTINE W REFLEX MICROSCOPIC - Abnormal; Notable for the following:    Hgb urine dipstick TRACE (*)    All other components within normal  limits  TROPONIN I  URINE MICROSCOPIC-ADD ON   Imaging Review Dg Chest 2 View  07/20/2013   CLINICAL DATA:  Dizziness.  COPD.  Smoking history.  EXAM: CHEST  2 VIEW  COMPARISON:  05/31/2013  FINDINGS: Heart size is normal. There is calcification of the thoracic aorta up. The lungs are somewhat hyperinflated consistent with the clinical diagnosis of COPD, but there is no evidence of infiltrate, mass, effusion or collapse. Mild curvature degenerative change of the spine again noted.  IMPRESSION: Pulmonary hyperinflation.  No focal or active process otherwise.   Electronically Signed   By: Paulina Fusi M.D.   On: 07/20/2013 09:27    EKG Interpretation    Date/Time:  Wednesday July 20 2013 09:31:56 EST Ventricular Rate:  71 PR Interval:  222 QRS Duration: 100 QT Interval:  424 QTC Calculation: 460 R Axis:   91 Text Interpretation:  Sinus rhythm with 1st degree A-V block Rightward axis Borderline ECG When compared with ECG of 09-Jun-2013 12:53, No significant change was found Confirmed by Mette Southgate  MD, Hazyl Marseille ([id number]) on 07/20/2013 10:32:19 AM           Results for orders placed during the hospital encounter of 07/20/13  COMPREHENSIVE METABOLIC PANEL      Result Value Range   Sodium 139  135 - 145 mEq/L   Potassium 4.3  3.5 - 5.1 mEq/L   Chloride 99  96 - 112 mEq/L   CO2 31  19 - 32 mEq/L   Glucose, Bld 100 (*) 70 - 99 mg/dL   BUN 19  6 - 23 mg/dL   Creatinine, Ser 2.95  0.50 - 1.10 mg/dL   Calcium 9.6  8.4 - 62.1 mg/dL   Total Protein 6.9  6.0 - 8.3 g/dL   Albumin 3.2 (*) 3.5 - 5.2 g/dL   AST 19  0 - 37 U/L   ALT 18  0 - 35 U/L   Alkaline Phosphatase 88  39 - 117 U/L   Total Bilirubin 0.3  0.3 - 1.2  mg/dL   GFR calc non Af Amer 84 (*) >90 mL/min   GFR calc Af Amer >90  >90 mL/min  CBC WITH DIFFERENTIAL      Result Value Range   WBC 10.7 (*) 4.0 - 10.5 K/uL   RBC 4.26  3.87 - 5.11 MIL/uL   Hemoglobin 13.2  12.0 - 15.0 g/dL   HCT 30.8  65.7 - 84.6 %   MCV 96.0  78.0 - 100.0 fL   MCH 31.0  26.0 - 34.0 pg   MCHC 32.3  30.0 - 36.0 g/dL   RDW 96.2  95.2 - 84.1 %   Platelets 254  150 - 400 K/uL   Neutrophils Relative % 71  43 - 77 %   Neutro Abs 7.7  1.7 - 7.7 K/uL   Lymphocytes Relative 15  12 - 46 %   Lymphs Abs 1.6  0.7 - 4.0 K/uL   Monocytes Relative 12  3 - 12 %   Monocytes Absolute 1.3 (*) 0.1 - 1.0 K/uL   Eosinophils Relative 1  0 - 5 %   Eosinophils Absolute 0.1  0.0 - 0.7 K/uL   Basophils Relative 0  0 - 1 %   Basophils Absolute 0.0  0.0 - 0.1 K/uL  URINALYSIS, ROUTINE W REFLEX MICROSCOPIC      Result Value Range   Color, Urine YELLOW  YELLOW   APPearance CLEAR  CLEAR   Specific Gravity, Urine 1.010  1.005 - 1.030   pH 5.5  5.0 - 8.0   Glucose, UA NEGATIVE  NEGATIVE mg/dL   Hgb urine dipstick TRACE (*) NEGATIVE   Bilirubin Urine NEGATIVE  NEGATIVE   Ketones, ur NEGATIVE  NEGATIVE mg/dL   Protein, ur NEGATIVE  NEGATIVE mg/dL   Urobilinogen, UA 0.2  0.0 - 1.0 mg/dL   Nitrite NEGATIVE  NEGATIVE   Leukocytes, UA NEGATIVE  NEGATIVE  TROPONIN I      Result Value Range   Troponin I <0.30  <0.30 ng/mL  URINE MICROSCOPIC-ADD ON      Result Value Range   RBC / HPF 0-2  <3 RBC/hpf   No results found.  MDM  No diagnosis found. Patient in no acute distress. Screening labs, urinalysis, EKG, chest x-ray all negative. Rx Antivert for vertigo     I personally performed the services described in this documentation, which was scribed in my presence. The recorded information has been reviewed and is accurate.    Donnetta Hutching, MD 07/20/13 [phone]

## 2013-08-04 ENCOUNTER — Ambulatory Visit (INDEPENDENT_AMBULATORY_CARE_PROVIDER_SITE_OTHER): Payer: Medicare Other | Admitting: Otolaryngology

## 2013-08-04 ENCOUNTER — Encounter (INDEPENDENT_AMBULATORY_CARE_PROVIDER_SITE_OTHER): Payer: Self-pay

## 2013-08-04 DIAGNOSIS — J322 Chronic ethmoidal sinusitis: Secondary | ICD-10-CM

## 2013-08-04 DIAGNOSIS — J320 Chronic maxillary sinusitis: Secondary | ICD-10-CM

## 2013-08-10 ENCOUNTER — Inpatient Hospital Stay (HOSPITAL_COMMUNITY)
Admission: EM | Admit: 2013-08-10 | Discharge: 2013-08-12 | DRG: 190 | Disposition: A | Discharge status: Home / Self Care | Payer: Medicare Other | Attending: Internal Medicine | Admitting: Internal Medicine

## 2013-08-10 ENCOUNTER — Emergency Department (HOSPITAL_COMMUNITY): Payer: Medicare Other

## 2013-08-10 ENCOUNTER — Encounter (HOSPITAL_COMMUNITY): Payer: Self-pay | Admitting: Emergency Medicine

## 2013-08-10 DIAGNOSIS — Z720 Tobacco use: Secondary | ICD-10-CM

## 2013-08-10 DIAGNOSIS — F418 Other specified anxiety disorders: Secondary | ICD-10-CM | POA: Diagnosis present

## 2013-08-10 DIAGNOSIS — D72829 Elevated white blood cell count, unspecified: Secondary | ICD-10-CM | POA: Diagnosis present

## 2013-08-10 DIAGNOSIS — M549 Dorsalgia, unspecified: Secondary | ICD-10-CM | POA: Diagnosis present

## 2013-08-10 DIAGNOSIS — R739 Hyperglycemia, unspecified: Secondary | ICD-10-CM | POA: Diagnosis present

## 2013-08-10 DIAGNOSIS — K5900 Constipation, unspecified: Secondary | ICD-10-CM

## 2013-08-10 DIAGNOSIS — T380X5A Adverse effect of glucocorticoids and synthetic analogues, initial encounter: Secondary | ICD-10-CM | POA: Diagnosis present

## 2013-08-10 DIAGNOSIS — J439 Emphysema, unspecified: Secondary | ICD-10-CM

## 2013-08-10 DIAGNOSIS — Z8249 Family history of ischemic heart disease and other diseases of the circulatory system: Secondary | ICD-10-CM

## 2013-08-10 DIAGNOSIS — D649 Anemia, unspecified: Secondary | ICD-10-CM | POA: Diagnosis present

## 2013-08-10 DIAGNOSIS — J9600 Acute respiratory failure, unspecified whether with hypoxia or hypercapnia: Secondary | ICD-10-CM | POA: Diagnosis present

## 2013-08-10 DIAGNOSIS — J441 Chronic obstructive pulmonary disease with (acute) exacerbation: Principal | ICD-10-CM | POA: Diagnosis present

## 2013-08-10 DIAGNOSIS — F341 Dysthymic disorder: Secondary | ICD-10-CM | POA: Diagnosis present

## 2013-08-10 DIAGNOSIS — F4320 Adjustment disorder, unspecified: Secondary | ICD-10-CM

## 2013-08-10 DIAGNOSIS — R7309 Other abnormal glucose: Secondary | ICD-10-CM | POA: Diagnosis present

## 2013-08-10 DIAGNOSIS — R634 Abnormal weight loss: Secondary | ICD-10-CM

## 2013-08-10 DIAGNOSIS — Z8673 Personal history of transient ischemic attack (TIA), and cerebral infarction without residual deficits: Secondary | ICD-10-CM

## 2013-08-10 DIAGNOSIS — Z7982 Long term (current) use of aspirin: Secondary | ICD-10-CM

## 2013-08-10 DIAGNOSIS — T50905A Adverse effect of unspecified drugs, medicaments and biological substances, initial encounter: Secondary | ICD-10-CM

## 2013-08-10 DIAGNOSIS — R0902 Hypoxemia: Secondary | ICD-10-CM

## 2013-08-10 DIAGNOSIS — K625 Hemorrhage of anus and rectum: Secondary | ICD-10-CM

## 2013-08-10 DIAGNOSIS — Z86718 Personal history of other venous thrombosis and embolism: Secondary | ICD-10-CM

## 2013-08-10 DIAGNOSIS — E860 Dehydration: Secondary | ICD-10-CM | POA: Diagnosis present

## 2013-08-10 DIAGNOSIS — F17200 Nicotine dependence, unspecified, uncomplicated: Secondary | ICD-10-CM | POA: Diagnosis present

## 2013-08-10 DIAGNOSIS — G8929 Other chronic pain: Secondary | ICD-10-CM | POA: Diagnosis present

## 2013-08-10 DIAGNOSIS — R918 Other nonspecific abnormal finding of lung field: Secondary | ICD-10-CM | POA: Diagnosis present

## 2013-08-10 DIAGNOSIS — Z833 Family history of diabetes mellitus: Secondary | ICD-10-CM

## 2013-08-10 DIAGNOSIS — J449 Chronic obstructive pulmonary disease, unspecified: Secondary | ICD-10-CM

## 2013-08-10 DIAGNOSIS — J9601 Acute respiratory failure with hypoxia: Secondary | ICD-10-CM | POA: Diagnosis present

## 2013-08-10 HISTORY — DX: Other nonspecific abnormal finding of lung field: R91.8

## 2013-08-10 MED ORDER — SODIUM CHLORIDE 0.9 % IV BOLUS (SEPSIS)
250.0000 mL | Freq: Once | INTRAVENOUS | Status: AC
  Administered 2013-08-11: 250 mL via INTRAVENOUS

## 2013-08-10 MED ORDER — SODIUM CHLORIDE 0.9 % IV SOLN
INTRAVENOUS | Status: DC
  Administered 2013-08-11: via INTRAVENOUS

## 2013-08-10 MED ORDER — ALBUTEROL SULFATE (5 MG/ML) 0.5% IN NEBU
5.0000 mg | INHALATION_SOLUTION | Freq: Once | RESPIRATORY_TRACT | Status: AC
  Administered 2013-08-10: 5 mg via RESPIRATORY_TRACT
  Filled 2013-08-10: qty 1

## 2013-08-10 MED ORDER — IPRATROPIUM BROMIDE 0.02 % IN SOLN
0.5000 mg | Freq: Once | RESPIRATORY_TRACT | Status: AC
  Administered 2013-08-10: 0.5 mg via RESPIRATORY_TRACT
  Filled 2013-08-10: qty 2.5

## 2013-08-10 NOTE — ED Notes (Signed)
Pt c/o increased sob and back pain x 2 days.

## 2013-08-11 ENCOUNTER — Emergency Department (HOSPITAL_COMMUNITY): Payer: Medicare Other

## 2013-08-11 ENCOUNTER — Encounter (HOSPITAL_COMMUNITY): Payer: Self-pay

## 2013-08-11 DIAGNOSIS — Z720 Tobacco use: Secondary | ICD-10-CM | POA: Diagnosis present

## 2013-08-11 DIAGNOSIS — M549 Dorsalgia, unspecified: Secondary | ICD-10-CM

## 2013-08-11 DIAGNOSIS — J439 Emphysema, unspecified: Secondary | ICD-10-CM | POA: Diagnosis present

## 2013-08-11 DIAGNOSIS — J441 Chronic obstructive pulmonary disease with (acute) exacerbation: Principal | ICD-10-CM

## 2013-08-11 DIAGNOSIS — R918 Other nonspecific abnormal finding of lung field: Secondary | ICD-10-CM

## 2013-08-11 DIAGNOSIS — F341 Dysthymic disorder: Secondary | ICD-10-CM

## 2013-08-11 DIAGNOSIS — R739 Hyperglycemia, unspecified: Secondary | ICD-10-CM | POA: Diagnosis present

## 2013-08-11 DIAGNOSIS — J9600 Acute respiratory failure, unspecified whether with hypoxia or hypercapnia: Secondary | ICD-10-CM

## 2013-08-11 DIAGNOSIS — J449 Chronic obstructive pulmonary disease, unspecified: Secondary | ICD-10-CM

## 2013-08-11 DIAGNOSIS — F418 Other specified anxiety disorders: Secondary | ICD-10-CM | POA: Diagnosis present

## 2013-08-11 DIAGNOSIS — T50905A Adverse effect of unspecified drugs, medicaments and biological substances, initial encounter: Secondary | ICD-10-CM | POA: Diagnosis present

## 2013-08-11 DIAGNOSIS — J9601 Acute respiratory failure with hypoxia: Secondary | ICD-10-CM | POA: Diagnosis present

## 2013-08-11 DIAGNOSIS — G8929 Other chronic pain: Secondary | ICD-10-CM

## 2013-08-11 HISTORY — DX: Other nonspecific abnormal finding of lung field: R91.8

## 2013-08-11 LAB — URINALYSIS, ROUTINE W REFLEX MICROSCOPIC
Bilirubin Urine: NEGATIVE
Glucose, UA: NEGATIVE mg/dL
Ketones, ur: NEGATIVE mg/dL
Leukocytes, UA: NEGATIVE
Nitrite: NEGATIVE
Protein, ur: NEGATIVE mg/dL
Specific Gravity, Urine: 1.025 (ref 1.005–1.030)
Urobilinogen, UA: 0.2 mg/dL (ref 0.0–1.0)
pH: 5.5 (ref 5.0–8.0)

## 2013-08-11 LAB — COMPREHENSIVE METABOLIC PANEL
ALT: 10 U/L (ref 0–35)
AST: 15 U/L (ref 0–37)
Albumin: 3.3 g/dL — ABNORMAL LOW (ref 3.5–5.2)
Alkaline Phosphatase: 108 U/L (ref 39–117)
BUN: 26 mg/dL — ABNORMAL HIGH (ref 6–23)
CO2: 26 mEq/L (ref 19–32)
Calcium: 10.2 mg/dL (ref 8.4–10.5)
Chloride: 99 mEq/L (ref 96–112)
Creatinine, Ser: 0.92 mg/dL (ref 0.50–1.10)
GFR calc Af Amer: 71 mL/min — ABNORMAL LOW (ref >90)
GFR calc non Af Amer: 62 mL/min — ABNORMAL LOW (ref >90)
Glucose, Bld: 158 mg/dL — ABNORMAL HIGH (ref 70–99)
Potassium: 3.7 mEq/L (ref 3.5–5.1)
Sodium: 139 mEq/L (ref 135–145)
Total Bilirubin: 0.2 mg/dL — ABNORMAL LOW (ref 0.3–1.2)
Total Protein: 6.7 g/dL (ref 6.0–8.3)

## 2013-08-11 LAB — CBC WITH DIFFERENTIAL/PLATELET
Basophils Absolute: 0.1 10*3/uL (ref 0.0–0.1)
Basophils Relative: 1 % (ref 0–1)
Eosinophils Absolute: 0.2 10*3/uL (ref 0.0–0.7)
Eosinophils Relative: 2 % (ref 0–5)
HCT: 38.1 % (ref 36.0–46.0)
Hemoglobin: 12.4 g/dL (ref 12.0–15.0)
Lymphocytes Relative: 27 % (ref 12–46)
Lymphs Abs: 2.5 10*3/uL (ref 0.7–4.0)
MCH: 30.6 pg (ref 26.0–34.0)
MCHC: 32.5 g/dL (ref 30.0–36.0)
MCV: 94.1 fL (ref 78.0–100.0)
Monocytes Absolute: 0.9 10*3/uL (ref 0.1–1.0)
Monocytes Relative: 10 % (ref 3–12)
Neutro Abs: 5.5 10*3/uL (ref 1.7–7.7)
Neutrophils Relative %: 61 % (ref 43–77)
Platelets: 326 10*3/uL (ref 150–400)
RBC: 4.05 MIL/uL (ref 3.87–5.11)
RDW: 12.6 % (ref 11.5–15.5)
WBC: 9.1 10*3/uL (ref 4.0–10.5)

## 2013-08-11 LAB — GLUCOSE, CAPILLARY
Glucose-Capillary: 145 mg/dL — ABNORMAL HIGH (ref 70–99)
Glucose-Capillary: 114 mg/dL — ABNORMAL HIGH (ref 70–99)
Glucose-Capillary: 180 mg/dL — ABNORMAL HIGH (ref 70–99)

## 2013-08-11 LAB — PRO B NATRIURETIC PEPTIDE: Pro B Natriuretic peptide (BNP): 80.4 pg/mL (ref 0–125)

## 2013-08-11 LAB — D-DIMER, QUANTITATIVE: D-Dimer, Quant: 0.73 ug/mL-FEU — ABNORMAL HIGH (ref 0.00–0.48)

## 2013-08-11 LAB — TROPONIN I: Troponin I: <0.3 ng/mL (ref <0.30)

## 2013-08-11 LAB — URINE MICROSCOPIC-ADD ON

## 2013-08-11 MED ORDER — BIOTENE DRY MOUTH MT LIQD: 15.0000 mL | Freq: Two times a day (BID) | OROMUCOSAL | Status: DC

## 2013-08-11 MED ORDER — ALBUTEROL SULFATE (5 MG/ML) 0.5% IN NEBU
2.5000 mg | INHALATION_SOLUTION | Freq: Four times a day (QID) | RESPIRATORY_TRACT | Status: DC
  Administered 2013-08-11 – 2013-08-12 (×4): 2.5 mg via RESPIRATORY_TRACT
  Filled 2013-08-11 (×4): qty 0.5

## 2013-08-11 MED ORDER — HYDROCODONE-ACETAMINOPHEN 5-325 MG PO TABS
1.0000 | ORAL_TABLET | ORAL | Status: DC | PRN
  Administered 2013-08-11: 1 via ORAL
  Filled 2013-08-11: qty 1

## 2013-08-11 MED ORDER — INSULIN GLARGINE 100 UNIT/ML ~~LOC~~ SOLN
10.0000 [IU] | Freq: Every day | SUBCUTANEOUS | Status: DC
  Administered 2013-08-11: 10 [IU] via SUBCUTANEOUS
  Filled 2013-08-11 (×4): qty 0.1

## 2013-08-11 MED ORDER — ALBUTEROL SULFATE (5 MG/ML) 0.5% IN NEBU: 2.5000 mg | INHALATION_SOLUTION | RESPIRATORY_TRACT | Status: DC | PRN

## 2013-08-11 MED ORDER — SODIUM CHLORIDE 0.9 % IJ SOLN
3.0000 mL | Freq: Two times a day (BID) | INTRAMUSCULAR | Status: DC
  Administered 2013-08-11: 3 mL via INTRAVENOUS

## 2013-08-11 MED ORDER — IPRATROPIUM BROMIDE 0.02 % IN SOLN
0.5000 mg | Freq: Once | RESPIRATORY_TRACT | Status: AC
  Administered 2013-08-11: 0.5 mg via RESPIRATORY_TRACT
  Filled 2013-08-11: qty 2.5

## 2013-08-11 MED ORDER — IBUPROFEN 800 MG PO TABS
400.0000 mg | ORAL_TABLET | Freq: Four times a day (QID) | ORAL | Status: DC | PRN
  Administered 2013-08-11: 400 mg via ORAL
  Filled 2013-08-11: qty 1

## 2013-08-11 MED ORDER — SODIUM CHLORIDE 0.9 % IJ SOLN: 3.0000 mL | INTRAMUSCULAR | Status: DC | PRN

## 2013-08-11 MED ORDER — SODIUM CHLORIDE 0.9 % IV SOLN: 250.0000 mL | INTRAVENOUS | Status: DC | PRN

## 2013-08-11 MED ORDER — METHYLPREDNISOLONE SODIUM SUCC 125 MG IJ SOLR
80.0000 mg | Freq: Two times a day (BID) | INTRAMUSCULAR | Status: DC
  Administered 2013-08-11 – 2013-08-12 (×3): 80 mg via INTRAVENOUS
  Filled 2013-08-11 (×3): qty 2

## 2013-08-11 MED ORDER — METHYLPREDNISOLONE SODIUM SUCC 125 MG IJ SOLR
125.0000 mg | Freq: Once | INTRAMUSCULAR | Status: AC
  Administered 2013-08-11: 125 mg via INTRAVENOUS
  Filled 2013-08-11: qty 2

## 2013-08-11 MED ORDER — GUAIFENESIN-DM 100-10 MG/5ML PO SYRP: 5.0000 mL | ORAL_SOLUTION | ORAL | Status: DC | PRN

## 2013-08-11 MED ORDER — INSULIN ASPART 100 UNIT/ML ~~LOC~~ SOLN
0.0000 [IU] | Freq: Three times a day (TID) | SUBCUTANEOUS | Status: DC
  Administered 2013-08-11 – 2013-08-12 (×2): 2 [IU] via SUBCUTANEOUS

## 2013-08-11 MED ORDER — ALBUTEROL SULFATE (5 MG/ML) 0.5% IN NEBU
5.0000 mg | INHALATION_SOLUTION | Freq: Once | RESPIRATORY_TRACT | Status: AC
  Administered 2013-08-11: 5 mg via RESPIRATORY_TRACT
  Filled 2013-08-11: qty 1

## 2013-08-11 MED ORDER — ONDANSETRON HCL 4 MG/2ML IJ SOLN
4.0000 mg | Freq: Once | INTRAMUSCULAR | Status: AC
  Administered 2013-08-11: 4 mg via INTRAVENOUS
  Filled 2013-08-11: qty 2

## 2013-08-11 MED ORDER — MORPHINE SULFATE 4 MG/ML IJ SOLN
4.0000 mg | Freq: Once | INTRAMUSCULAR | Status: AC
  Administered 2013-08-11: 4 mg via INTRAVENOUS
  Filled 2013-08-11: qty 1

## 2013-08-11 MED ORDER — INSULIN ASPART 100 UNIT/ML ~~LOC~~ SOLN: 0.0000 [IU] | Freq: Every day | SUBCUTANEOUS | Status: DC

## 2013-08-11 MED ORDER — IOHEXOL 350 MG/ML SOLN
100.0000 mL | Freq: Once | INTRAVENOUS | Status: AC | PRN
  Administered 2013-08-11: 100 mL via INTRAVENOUS

## 2013-08-11 MED ORDER — GABAPENTIN 300 MG PO CAPS
300.0000 mg | ORAL_CAPSULE | Freq: Every day | ORAL | Status: DC
  Filled 2013-08-11: qty 1

## 2013-08-11 MED ORDER — IPRATROPIUM BROMIDE 0.02 % IN SOLN
0.5000 mg | Freq: Four times a day (QID) | RESPIRATORY_TRACT | Status: DC
  Administered 2013-08-11 – 2013-08-12 (×4): 0.5 mg via RESPIRATORY_TRACT
  Filled 2013-08-11 (×4): qty 2.5

## 2013-08-11 MED ORDER — SODIUM CHLORIDE 0.9 % IV SOLN: 1000.0000 mL | Freq: Once | INTRAVENOUS | Status: DC

## 2013-08-11 MED ORDER — LEVOFLOXACIN IN D5W 500 MG/100ML IV SOLN
500.0000 mg | INTRAVENOUS | Status: DC
  Administered 2013-08-11 – 2013-08-12 (×2): 500 mg via INTRAVENOUS
  Filled 2013-08-11 (×5): qty 100

## 2013-08-11 MED ORDER — SODIUM CHLORIDE 0.9 % IV SOLN: 1000.0000 mL | INTRAVENOUS | Status: DC

## 2013-08-11 MED ORDER — POTASSIUM CHLORIDE IN NACL 20-0.9 MEQ/L-% IV SOLN
INTRAVENOUS | Status: DC
  Administered 2013-08-11 – 2013-08-12 (×2): via INTRAVENOUS

## 2013-08-11 NOTE — Progress Notes (Signed)
Utilization Review Complete  

## 2013-08-11 NOTE — Progress Notes (Signed)
Nutrition Brief Note  Patient identified on the Malnutrition Screening Tool (MST) Report   Sodium  Date/Time Value Range Status  08/10/2013 11:55 PM 139  135 - 145 mEq/L Final  07/20/2013  9:00 AM 139  135 - 145 mEq/L Final  06/09/2013 12:55 PM 141  135 - 145 mEq/L Final  05/10/2013 12:14 PM 141  134 - 144 mmol/L Final  04/12/2013  4:53 PM 144  134 - 144 mmol/L Final    Potassium  Date/Time Value Range Status  08/10/2013 11:55 PM 3.7  3.5 - 5.1 mEq/L Final  07/20/2013  9:00 AM 4.3  3.5 - 5.1 mEq/L Final  06/09/2013 12:55 PM 4.4  3.5 - 5.1 mEq/L Final    No results found for this basename: phos    No results found for this basename: mg     Wt Readings from Last 15 Encounters:  08/11/13 110 lb 14.4 oz (50.304 kg)  07/20/13 110 lb (49.896 kg)  07/11/13 112 lb (50.803 kg)  07/11/13 112 lb (50.803 kg)  06/09/13 112 lb (50.803 kg)  06/01/13 109 lb 3.2 oz (49.533 kg)  05/10/13 108 lb (48.988 kg)  04/16/13 108 lb (48.988 kg)  04/12/13 107 lb 9.6 oz (48.807 kg)  01/10/13 104 lb 3.2 oz (47.265 kg)  12/10/12 105 lb 4.8 oz (47.764 kg)  10/28/12 105 lb (47.628 kg)  10/28/12 102 lb 6.4 oz (46.448 kg)  06/11/12 103 lb (46.72 kg)    Body mass index is 20.28 kg/(m^2). Patient meets criteria for normal range based on current BMI.   Current diet order is Heart Healthy,  patient is consuming approximately 90% of meals at this time. Labs and medications reviewed.   No nutrition interventions warranted at this time. If nutrition issues arise, please consult RD.   Royann Shivers MS,RD,CSG,LDN Office: [phone] Pager: [phone]

## 2013-08-11 NOTE — H&P (Signed)
PCP:   Cassell Smiles., MD   Chief Complaint:  sob  HPI: 72 yo female h/o copd but not on home oxgyen comes in with several days of sob and wheezing.  No fevers.  Some mild nonprod cough.  No le edema or swelling.  Was on a steroid taper months ago.  zpack 2 weeks ago for copde with her pcp.  She use to smoke 2 ppd of cigerettes but is down to 1/2 ppd over the last 6 months.  Feels better with iv solumedrol and nebs received in ED.  Review of Systems:  Positive and negative as per HPI otherwise all other systems are negative  Past Medical History: Past Medical History  Diagnosis Date  . Constipation   . Depression with anxiety   . Diverticulosis   . DVT (deep venous thrombosis)   . COPD (chronic obstructive pulmonary disease)   . Carotid artery occlusion   . Anxiety   . TIA (transient ischemic attack)   . Full dentures    Past Surgical History  Procedure Laterality Date  . Abdominal hysterectomy    . Back surgery      X3  . Colonoscopy  June 2002    Dr. Noe Gens: severe diverticular disease with haustral hypertrophy, primary and secondary diverticulosis, persistent spasticity, early stenosis  . Colonoscopy with propofol N/A 06/21/2013    Procedure: COLONOSCOPY WITH PROPOFOL;  Surgeon: West Bali, MD;  Location: [facility];  Service: Endoscopy;  Laterality: N/A;  in cecum at 0811; total time = 14 minutes  . Tonsillectomy    . Appendectomy    . Eye surgery      both cataracts-lenses  . Nasal sinus surgery Right 07/11/2013    Procedure: RIGHT ENDOSCOPIC ANTERIOR ETHMOIDECTOMY/RIGHT ENDOSCOPIC MAXILLARY ANTROSTOMY WITH REMOVAL OF TISSUE;  Surgeon: Darletta Moll, MD;  Location: [facility];  Service: ENT;  Laterality: Right;    Medications: Prior to Admission medications   Medication Sig Start Date End Date Taking? Authorizing Provider  aspirin EC 325 MG tablet Take 325 mg by mouth daily.   Yes Historical Provider, MD  beclomethasone (QVAR) 40 MCG/ACT inhaler  Inhale 2 puffs into the lungs daily.   Yes Historical Provider, MD  gabapentin (NEURONTIN) 300 MG capsule Take 300 mg by mouth at bedtime.   Yes Historical Provider, MD  HYDROcodone-acetaminophen (NORCO) 7.5-325 MG per tablet Take 1 tablet by mouth 2 (two) times daily as needed. For pain 07/29/13  Yes Historical Provider, MD  ipratropium-albuterol (DUONEB) 0.5-2.5 (3) MG/3ML SOLN Take 3 mLs by nebulization every 6 (six) hours as needed (shortness of breath or wheezing).   Yes Historical Provider, MD  Linaclotide (LINZESS) 145 MCG CAPS capsule Take 145 mcg by mouth at bedtime.   Yes Historical Provider, MD  meclizine (ANTIVERT) 25 MG tablet Take 1 tablet (25 mg total) by mouth 3 (three) times daily as needed for dizziness. 07/20/13  Yes Donnetta Hutching, MD    Allergies:   Allergies  Allergen Reactions  . Codeine Other (See Comments)    Makes patient not in right state of mind.   Durene Fruits [Pentazocine] Other (See Comments)    Makes patient not in right state of mind.   . Valium [Diazepam] Other (See Comments)    Just doesn't work.     Social History:  reports that she quit smoking about 9 months ago. Her smoking use included Cigarettes. She has a 50 pack-year smoking history. She does not have any smokeless tobacco history  on file. She reports that she does not drink alcohol or use illicit drugs.  Family History: Family History  Problem Relation Age of Onset  . Colon cancer Neg Hx   . Cancer Mother   . Heart disease Mother   . Hypertension Mother   . Heart attack Mother   . Other Mother     varicose veins  . Diabetes Brother   . Hypertension Brother     Physical Exam: Filed Vitals:   08/11/13 0118 08/11/13 0142 08/11/13 0142 08/11/13 0303  BP:    91/40  Pulse:    86  Temp:      TempSrc:      Resp:    13  Height:      Weight:      SpO2: 94% 84% 95% 95%   General appearance: alert, cooperative and no distress Head: Normocephalic, without obvious abnormality, atraumatic Eyes:  negative Nose: Nares normal. Septum midline. Mucosa normal. No drainage or sinus tenderness. Neck: no JVD and supple, symmetrical, trachea midline Lungs: clear to auscultation bilaterally Heart: regular rate and rhythm, S1, S2 normal, no murmur, click, rub or gallop Abdomen: soft, non-tender; bowel sounds normal; no masses,  no organomegaly Extremities: extremities normal, atraumatic, no cyanosis or edema Pulses: 2+ and symmetric Skin: Skin color, texture, turgor normal. No rashes or lesions Neurologic: Grossly normal    Labs on Admission:   Recent Labs  08/10/13 2355  NA 139  K 3.7  CL 99  CO2 26  GLUCOSE 158*  BUN 26*  CREATININE 0.92  CALCIUM 10.2    Recent Labs  08/10/13 2355  AST 15  ALT 10  ALKPHOS 108  BILITOT 0.2*  PROT 6.7  ALBUMIN 3.3*    Recent Labs  08/10/13 2355  WBC 9.1  NEUTROABS 5.5  HGB 12.4  HCT 38.1  MCV 94.1  PLT 326    Recent Labs  08/10/13 2355  TROPONINI <0.30   Radiological Exams on Admission: Dg Chest 2 View  08/10/2013   CLINICAL DATA:  Shortness of breath. Back pain. COPD. History of smoking.  EXAM: CHEST  2 VIEW  COMPARISON:  07/1913  FINDINGS: The lungs are hyperinflated. There perihilar bronchitic changes. No focal consolidations or pleural effusions are identified. No edema. Visualized osseous structures have a normal appearance.  IMPRESSION: 1. COPD and bronchitic changes. 2.  No focal acute pulmonary abnormality.   Electronically Signed   By: Rosalie Gums M.D.   On: 08/10/2013 23:44   Ct Angio Chest Pe W/cm &/or Wo Cm  08/11/2013   CLINICAL DATA:  Increased dyspnea  EXAM: CT ANGIOGRAPHY CHEST WITH CONTRAST  TECHNIQUE: Multidetector CT imaging of the chest was performed using the standard protocol during bolus administration of intravenous contrast. Multiplanar CT image reconstructions including MIPs were obtained to evaluate the vascular anatomy.  CONTRAST:  OMNIPAQUE IOHEXOL 350 MG/ML SOLN  COMPARISON:  Prior  radiograph from 08/10/2013  FINDINGS: No pathologically enlarged mediastinal, hilar, or axillary lymph nodes are identified.  Scattered calcified atheromatous disease is noted within the aortic arch and proximal great vessels. No intrathoracic aortic aneurysm.  Heart size is within normal limits. Diffuse 3 vessel coronary artery calcifications are present. No pericardial effusion.  Pulmonary arterial tree is well opacified. No filling defects are seen to suggest acute pulmonary embolism. Re-formatted imaging confirms these findings.  Severe emphysematous changes are seen diffusely throughout the lungs, greatest within the upper lobes. Irregular biapical pleural thickening is noted. No focal infiltrate, pulmonary edema, or  pleural effusion identified. Linear opacity within the lingula and subpleural right lung base most likely reflect atelectasis and/or scarring. Mild bronchiectasis noted within the right lower lobe.  Single 5 mm pulmonary nodule is present within the inferior right lower lobe (series 8, image 55). A few additional scattered 3-4 mm nodules are seen within the right middle lobe (series 8, image 57, 60). These findings are indeterminate.  Visualized upper abdomen is within normal limits. Incidental note is made of a partially visualized exophytic hypodense lesion extending from the anterior medial left kidney, incompletely evaluated.  Osseous structures are within normal limits. No focal lytic or blastic osseous lesions are identified.  IMPRESSION: 1. No CT evidence of acute pulmonary embolism. 2. Severe panlobular emphysema. 3. Indeterminate 4-5 mm nodules within the right upper and middle lobes as above. A followup study in 6 months could be performed to ensure stability/resolution as clinically indicated. Followup is based on the Fleischner society recommendations.   Electronically Signed   By: Rise Mu M.D.   On: 08/11/2013 03:12    Assessment/Plan  72 yo female with  copde  Principal Problem:   Acute respiratory failure with hypoxia due to copde.  Iv solumedrol,  freq nebs.  Hold on abx at this time.  Oxygen prn.  Active Problems:   Chronic back pain   COPD (chronic obstructive pulmonary disease)   COPD exacerbation   Depression with anxiety  Encouraged to quit smoking.  Rabiah Goeser A 08/11/2013, 3:39 AM

## 2013-08-11 NOTE — Progress Notes (Signed)
The patient is a 72 year old with a history of COPD, who was admitted by Dr. Onalee Hua this morning for COPD exacerbation. She was briefly seen and examined. Her vital signs and laboratory studies were reviewed. She is feeling much better. We'll continue current management, but will add sliding scale NovoLog for steroid-induced hyperglycemia and Levaquin empirically. We'll also add gentle IV fluids as her BUN is slightly elevated which may actually worsen with steroid therapy. She was also informed of the pulmonary nodules and the importance of stopping smoking and outpatient surveillance of these nodules. We'll order tobacco cessation counseling.

## 2013-08-11 NOTE — ED Provider Notes (Signed)
Care assumed from Dr. Preston Fleeting at shift change. Patient presents here with a two-day history of chest congestion and shortness of breath. She was found to be hypoxic and has a history of COPD. She was given steroids and repeat breathing treatments however has remained hypoxic. Her d-dimer was elevated and care was signed out to me awaiting a CT scan of the chest to rule out pulmonary embolism. This was performed and was negative. I have consult with Dr. Onalee Hua from [facility] who agrees to admit the patient for treatment of COPD exacerbation.  Geoffery Lyons, MD 08/11/13 [phone]

## 2013-08-11 NOTE — Care Management Note (Unsigned)
    Page 1 of 1   08/11/2013     3:32:02 PM   CARE MANAGEMENT NOTE 08/11/2013  Patient:  Lindsay Mitchell, Lindsay Mitchell   Account Number:  [id number]  Date Initiated:  08/11/2013  Documentation initiated by:  Rosemary Holms  Subjective/Objective Assessment:   Pt admitted from home where she lives with her spouse. States she previously had [facility] but does not believe she will need HH at DC. No Equipment needs identified     Action/Plan:   Anticipated DC Date:  08/13/2013   Anticipated DC Plan:  HOME/SELF CARE      DC Planning Services  CM consult      Choice offered to / List presented to:             Status of service:  Completed, signed off Medicare Important Message given?   (If response is "NO", the following Medicare IM given date fields will be blank) Date Medicare IM given:   Date Additional Medicare IM given:    Discharge Disposition:    Per UR Regulation:    If discussed at Long Length of Stay Meetings, dates discussed:    Comments:  08/11/13 Rosemary Holms RN BSN CM

## 2013-08-11 NOTE — ED Provider Notes (Signed)
CSN: [id number]     Arrival date & time 08/10/13  2232 History   First MD Initiated Contact with Patient 08/11/13 0006     Chief Complaint  Patient presents with  . Shortness of Breath  . Back Pain   (Consider location/radiation/quality/duration/timing/severity/associated sxs/prior Treatment) Patient is a 72 y.o. female presenting with shortness of breath and back pain. The history is provided by the patient.  Shortness of Breath Back Pain She has a history of COPD but states that she is at increased dyspnea over the last 2 days. She had 2 episodes today where she felt as if she was smothering and totally unable to get any breath at all. There is no associated chest pain, heaviness, tightness, pressure. She did develop pain in the interscapular area about 5 PM. The pain is described as an achy pain it is worse with deep breathing and with twisting and turning. Pain is currently rated at 7/10, but has been as severe as 10/10. She is been having problems with nausea for about the last 3 weeks and this is unchanged. She states that she has not been eating or drinking very much and has only urinated twice in the last 48 hours. She denies fever but has felt alternately hot and cold. She has not broken out in sweats. There has been a cough productive of small amount of clear sputum. The 2 episodes tonight where she felt like she was smothering, she was not at home so she could not use her ipratropium-albuterol. She does feel better after an albuterol with ipratropium nebulizer treatment in the ED.  Past Medical History  Diagnosis Date  . Constipation   . Depression with anxiety   . Diverticulosis   . DVT (deep venous thrombosis)   . COPD (chronic obstructive pulmonary disease)   . Carotid artery occlusion   . Anxiety   . TIA (transient ischemic attack)   . Full dentures    Past Surgical History  Procedure Laterality Date  . Abdominal hysterectomy    . Back surgery      X3  . Colonoscopy   June 2002    Dr. Noe Gens: severe diverticular disease with haustral hypertrophy, primary and secondary diverticulosis, persistent spasticity, early stenosis  . Colonoscopy with propofol N/A 06/21/2013    Procedure: COLONOSCOPY WITH PROPOFOL;  Surgeon: West Bali, MD;  Location: [facility];  Service: Endoscopy;  Laterality: N/A;  in cecum at 0811; total time = 14 minutes  . Tonsillectomy    . Appendectomy    . Eye surgery      both cataracts-lenses  . Nasal sinus surgery Right 07/11/2013    Procedure: RIGHT ENDOSCOPIC ANTERIOR ETHMOIDECTOMY/RIGHT ENDOSCOPIC MAXILLARY ANTROSTOMY WITH REMOVAL OF TISSUE;  Surgeon: Darletta Moll, MD;  Location: [facility];  Service: ENT;  Laterality: Right;   Family History  Problem Relation Age of Onset  . Colon cancer Neg Hx   . Cancer Mother   . Heart disease Mother   . Hypertension Mother   . Heart attack Mother   . Other Mother     varicose veins  . Diabetes Brother   . Hypertension Brother    History  Substance Use Topics  . Smoking status: Former Smoker -- 1.00 packs/day for 50 years    Types: Cigarettes    Quit date: 10/12/2012  . Smokeless tobacco: Not on file     Comment: smoked X 60 years  . Alcohol Use: No   OB History  Grav Para Term Preterm Abortions TAB SAB Ect Mult Living                 Review of Systems  Respiratory: Positive for shortness of breath.   Musculoskeletal: Positive for back pain.  All other systems reviewed and are negative.    Allergies  Codeine; Talwin; and Valium  Home Medications   Current Outpatient Rx  Name  Route  Sig  Dispense  Refill  . aspirin EC 325 MG tablet   Oral   Take 325 mg by mouth daily.         . beclomethasone (QVAR) 40 MCG/ACT inhaler   Inhalation   Inhale 2 puffs into the lungs daily.         Marland Kitchen gabapentin (NEURONTIN) 300 MG capsule   Oral   Take 300 mg by mouth at bedtime.         Marland Kitchen HYDROcodone-acetaminophen (NORCO) 7.5-325 MG per tablet   Oral   Take 1  tablet by mouth 2 (two) times daily as needed. For pain         . ipratropium-albuterol (DUONEB) 0.5-2.5 (3) MG/3ML SOLN   Nebulization   Take 3 mLs by nebulization every 6 (six) hours as needed (shortness of breath or wheezing).         . Linaclotide (LINZESS) 145 MCG CAPS capsule   Oral   Take 145 mcg by mouth at bedtime.         . meclizine (ANTIVERT) 25 MG tablet   Oral   Take 1 tablet (25 mg total) by mouth 3 (three) times daily as needed for dizziness.   20 tablet   0    BP 127/72  Pulse 101  Temp(Src) 98.1 F (36.7 C) (Oral)  Resp 24  Ht 5\' 2"  (1.575 m)  Wt 105 lb (47.628 kg)  BMI 19.20 kg/m2  SpO2 94% Physical Exam  Nursing note and vitals reviewed.  72 year old female, resting comfortably and in no acute distress. Vital signs are significant for borderline tachycardia with heart rate 101, and tachypnea with respiratory rate of 24. Oxygen saturation is 94%, which is normal. However, her oxygen saturation was measured while she was on oxygen at 2 L per minute. Head is normocephalic and atraumatic. PERRLA, EOMI. Oropharynx is clear. Neck is nontender and supple without adenopathy or JVD. Back is nontender and there is no CVA tenderness. Lungs have decreased air movement with faint bibasilar rales. There is a prolonged exhalation phase. There are no overt wheezes or rhonchi.. Chest is nontender. Heart has regular rate and rhythm without murmur. Abdomen is soft, flat, nontender without masses or hepatosplenomegaly and peristalsis is normoactive. Extremities have no cyanosis or edema, full range of motion is present. Skin is warm and dry without rash. Neurologic: Mental status is normal, cranial nerves are intact, there are no motor or sensory deficits.  ED Course  Procedures (including critical care time) Labs Review Results for orders placed during the hospital encounter of 08/10/13  COMPREHENSIVE METABOLIC PANEL      Result Value Range   Sodium 139  135 -  145 mEq/L   Potassium 3.7  3.5 - 5.1 mEq/L   Chloride 99  96 - 112 mEq/L   CO2 26  19 - 32 mEq/L   Glucose, Bld 158 (*) 70 - 99 mg/dL   BUN 26 (*) 6 - 23 mg/dL   Creatinine, Ser 1.19  0.50 - 1.10 mg/dL   Calcium 14.7  8.4 -  10.5 mg/dL   Total Protein 6.7  6.0 - 8.3 g/dL   Albumin 3.3 (*) 3.5 - 5.2 g/dL   AST 15  0 - 37 U/L   ALT 10  0 - 35 U/L   Alkaline Phosphatase 108  39 - 117 U/L   Total Bilirubin 0.2 (*) 0.3 - 1.2 mg/dL   GFR calc non Af Amer 62 (*) >90 mL/min   GFR calc Af Amer 71 (*) >90 mL/min  CBC WITH DIFFERENTIAL      Result Value Range   WBC 9.1  4.0 - 10.5 K/uL   RBC 4.05  3.87 - 5.11 MIL/uL   Hemoglobin 12.4  12.0 - 15.0 g/dL   HCT 16.1  09.6 - 04.5 %   MCV 94.1  78.0 - 100.0 fL   MCH 30.6  26.0 - 34.0 pg   MCHC 32.5  30.0 - 36.0 g/dL   RDW 40.9  81.1 - 91.4 %   Platelets 326  150 - 400 K/uL   Neutrophils Relative % 61  43 - 77 %   Neutro Abs 5.5  1.7 - 7.7 K/uL   Lymphocytes Relative 27  12 - 46 %   Lymphs Abs 2.5  0.7 - 4.0 K/uL   Monocytes Relative 10  3 - 12 %   Monocytes Absolute 0.9  0.1 - 1.0 K/uL   Eosinophils Relative 2  0 - 5 %   Eosinophils Absolute 0.2  0.0 - 0.7 K/uL   Basophils Relative 1  0 - 1 %   Basophils Absolute 0.1  0.0 - 0.1 K/uL  TROPONIN I      Result Value Range   Troponin I <0.30  <0.30 ng/mL  PRO B NATRIURETIC PEPTIDE      Result Value Range   Pro B Natriuretic peptide (BNP) 80.4  0 - 125 pg/mL   Dg Chest 2 View  08/10/2013   CLINICAL DATA:  Shortness of breath. Back pain. COPD. History of smoking.  EXAM: CHEST  2 VIEW  COMPARISON:  07/1913  FINDINGS: The lungs are hyperinflated. There perihilar bronchitic changes. No focal consolidations or pleural effusions are identified. No edema. Visualized osseous structures have a normal appearance.  IMPRESSION: 1. COPD and bronchitic changes. 2.  No focal acute pulmonary abnormality.   Electronically Signed   By: Rosalie Gums M.D.   On: 08/10/2013 23:44   Imaging Review Dg Chest 2  View  08/10/2013   CLINICAL DATA:  Shortness of breath. Back pain. COPD. History of smoking.  EXAM: CHEST  2 VIEW  COMPARISON:  07/1913  FINDINGS: The lungs are hyperinflated. There perihilar bronchitic changes. No focal consolidations or pleural effusions are identified. No edema. Visualized osseous structures have a normal appearance.  IMPRESSION: 1. COPD and bronchitic changes. 2.  No focal acute pulmonary abnormality.   Electronically Signed   By: Rosalie Gums M.D.   On: 08/10/2013 23:44    EKG Interpretation    Date/Time:  Wednesday August 10 2013 22:37:52 EST Ventricular Rate:  94 PR Interval:  198 QRS Duration: 98 QT Interval:  370 QTC Calculation: 462 R Axis:   88 Text Interpretation:  Normal sinus rhythm Biatrial enlargement Abnormal ECG When compared with ECG of 20-Jul-2013 09:31, No significant change was found Confirmed by [facility]  MD, Yaire Kreher ([id number]) on 08/11/2013 12:09:46 AM            MDM   1. COPD exacerbation   2. Hypoxia   3. Upper back pain  COPD exacerbation. Persistent nausea of uncertain cause. Temperature workup is consistent with mild dehydration and she'll be given some IV fluids. She is also given a dose of methylprednisolone and will be given additional albuterol with ipratropium. Old records are have been reviewed and she does not have any recent ED visits for her COPD but she was seen about 3 weeks ago for weakness, dizziness, and nausea.  Laboratory workup shows a slight increase in BUN over baseline which would go along with mild dehydration and she's given IV fluids. She feels somewhat better after second albuterol with ipratropium nebulizer treatment and oxygen saturation is up to 96%. However, when she is taken off of oxygen, oxygen saturation drops quickly to 88%. She'll be given a third albuterol with ipratropium treatment. Note is made of a history of DVT. Have asked the patient about this and she was never on any anticoagulants and it sounds as if  she was actually being treated for arterial insufficiency and claudication. However, with pleuritic pain and unexplained hypoxia, d-dimer will be obtained to rule out DVT.  After her third albuterol with ipratropium nebulizer treatment, she again dropped her oxygen saturations to 84% when taken off of supplemental oxygen. She does not have oxygen at home. She will clearly need to be admitted. D-dimer is still pending. Case is signed out to Dr. Judd Lien to evaluate d-dimer report and order a CT angiogram of the chest if positive.  Dione Booze, MD 08/11/13 [phone]

## 2013-08-12 DIAGNOSIS — J438 Other emphysema: Secondary | ICD-10-CM

## 2013-08-12 DIAGNOSIS — D649 Anemia, unspecified: Secondary | ICD-10-CM | POA: Diagnosis not present

## 2013-08-12 LAB — BASIC METABOLIC PANEL
BUN: 22 mg/dL (ref 6–23)
CO2: 24 mEq/L (ref 19–32)
Calcium: 8.9 mg/dL (ref 8.4–10.5)
Chloride: 105 mEq/L (ref 96–112)
Creatinine, Ser: 0.8 mg/dL (ref 0.50–1.10)
GFR calc Af Amer: 85 mL/min — ABNORMAL LOW (ref >90)
GFR calc non Af Amer: 73 mL/min — ABNORMAL LOW (ref >90)
Glucose, Bld: 151 mg/dL — ABNORMAL HIGH (ref 70–99)
Potassium: 4.4 mEq/L (ref 3.5–5.1)
Sodium: 139 mEq/L (ref 135–145)

## 2013-08-12 LAB — CBC
HCT: 33.7 % — ABNORMAL LOW (ref 36.0–46.0)
Hemoglobin: 10.7 g/dL — ABNORMAL LOW (ref 12.0–15.0)
MCH: 30.6 pg (ref 26.0–34.0)
MCHC: 31.8 g/dL (ref 30.0–36.0)
MCV: 96.3 fL (ref 78.0–100.0)
Platelets: 265 10*3/uL (ref 150–400)
RBC: 3.5 MIL/uL — ABNORMAL LOW (ref 3.87–5.11)
RDW: 13.1 % (ref 11.5–15.5)
WBC: 19.6 10*3/uL — ABNORMAL HIGH (ref 4.0–10.5)

## 2013-08-12 LAB — GLUCOSE, CAPILLARY
Glucose-Capillary: 123 mg/dL — ABNORMAL HIGH (ref 70–99)
Glucose-Capillary: 114 mg/dL — ABNORMAL HIGH (ref 70–99)

## 2013-08-12 MED ORDER — LEVOFLOXACIN 500 MG PO TABS: 500.0000 mg | ORAL_TABLET | Freq: Every day | ORAL | Status: DC

## 2013-08-12 MED ORDER — ALBUTEROL SULFATE HFA 108 (90 BASE) MCG/ACT IN AERS: 2.0000 | INHALATION_SPRAY | RESPIRATORY_TRACT | Status: AC | PRN

## 2013-08-12 MED ORDER — PREDNISONE 10 MG PO TABS: ORAL_TABLET | ORAL | Status: DC

## 2013-08-12 MED ORDER — IPRATROPIUM-ALBUTEROL 0.5-2.5 (3) MG/3ML IN SOLN: 3.0000 mL | Freq: Three times a day (TID) | RESPIRATORY_TRACT | Status: DC

## 2013-08-12 MED ORDER — ALBUTEROL SULFATE HFA 108 (90 BASE) MCG/ACT IN AERS
2.0000 | INHALATION_SPRAY | RESPIRATORY_TRACT | Status: DC
  Administered 2013-08-12: 2 via RESPIRATORY_TRACT
  Filled 2013-08-12: qty 6.7

## 2013-08-12 NOTE — Discharge Summary (Signed)
Physician Discharge Summary  JANETT KAMATH ZOX:[id number] DOB: June 16, 1941 DOA: 08/10/2013  PCP: Cassell Smiles., MD  Admit date: 08/10/2013 Discharge date: 08/12/2013  Time spent: Greater than 30 minutes  Recommendations for Outpatient Follow-up:  1. Recommend CT scan of the chest in 6-12 months for followup evaluation of lung nodules. 2. Consider further elective/outpatient workup of anemia.  Discharge Diagnoses:  Principal Problem:   Acute respiratory failure with hypoxia Active Problems:   COPD exacerbation   Emphysema   Pulmonary nodules   Chronic back pain   Depression with anxiety   Hyperglycemia, drug-induced   Tobacco abuse   Anemia  Leukocytosis, secondary to steroid therapy.   Discharge Condition: improved.  Diet recommendation: heart healthy.  Filed Weights   08/10/13 2240 08/11/13 0453  Weight: 47.628 kg (105 lb) 50.304 kg (110 lb 14.4 oz)    History of present illness:  The patient is a 72 year old woman with a history of COPD and tobacco use, who presented to the emergency department on 08/10/2013 with a chief complaint of shortness of breath. In the ED, she was afebrile and hemodynamically stable. Her oxygen saturation was transiently 84% on room air. It improved to 95% on 2 L. D-dimer was ordered and it was elevated. Therefore, CTA of her chest was ordered to rule out PE. It was negative for PE, but it did reveal severe panlobular emphysema and several subcentimeter nodules in the right upper and middle lobes. She was admitted for further evaluation and management.  Hospital Course:  The patient was started on oxygen supplementation, titrated to keep her oxygen saturation greater than 92%. Treatment was started with IV Solu-Medrol, Levaquin, Atrovent/albuterol nebulizers, and supportive treatment. Gentle IV fluids were started. For steroid-induced hyperglycemia, sliding scale NovoLog was added. Most if not all of her other chronic medications were  continued. The patient improved rather quickly. At the time of hospital discharge, she was oxygenating in the low to mid 90s on room air. She remained afebrile. Her white blood cell count increased to 19,000, but this was thought to be secondary to IV steroid therapy. Her hemoglobin fell to 10.7 with IV fluid hydration. This can be followed in the outpatient setting. She was informed of the presence of pulmonary nodules. I informed her that she would need a followup CT of her chest in 6-12 months, which can be ordered by her primary care physician. She was instructed to talk to her primary care physician about this. She voiced understanding. She was encouraged to stop smoking forever. Tobacco cessation counseling was ordered during the hospitalization. She was discharged on a prednisone taper and Levaquin. She was instructed to continue Qvar and DuoNeb as previously prescribed.   Procedures:  None  Consultations:  None  Discharge Exam: Filed Vitals:   08/12/13 0522  BP: 118/57  Pulse: 80  Temp: 97.7 F (36.5 C)  Resp: 18    General: No acute distress. Cardiovascular: S1 S2 with a soft systolic murmur Respiratory: Clear bilaterally; breathing non-labored.  Discharge Instructions  Discharge Orders   Future Orders Complete By Expires   Diet - low sodium heart healthy  As directed    Discharge instructions  As directed    Comments:     You will need a followup CT of your chest for followup evaluation of the lung nodules. Discuss this with your primary care doctor or provider.   Increase activity slowly  As directed        Medication List  albuterol 108 (90 BASE) MCG/ACT inhaler  Commonly known as:  PROVENTIL HFA;VENTOLIN HFA  Inhale 2 puffs into the lungs every 4 (four) hours as needed for wheezing or shortness of breath.     aspirin EC 325 MG tablet  Take 325 mg by mouth daily.     beclomethasone 40 MCG/ACT inhaler  Commonly known as:  QVAR  Inhale 2 puffs into the  lungs daily.     gabapentin 300 MG capsule  Commonly known as:  NEURONTIN  Take 300 mg by mouth at bedtime.     HYDROcodone-acetaminophen 7.5-325 MG per tablet  Commonly known as:  NORCO  Take 1 tablet by mouth 2 (two) times daily as needed. For pain     ipratropium-albuterol 0.5-2.5 (3) MG/3ML Soln  Commonly known as:  DUONEB  Take 3 mLs by nebulization 3 (three) times daily.     levofloxacin 500 MG tablet  Commonly known as:  LEVAQUIN  Take 1 tablet (500 mg total) by mouth daily. Starting tomorrow, take antibiotic for 5 more days.     LINZESS 145 MCG Caps capsule  Generic drug:  Linaclotide  Take 145 mcg by mouth at bedtime.     meclizine 25 MG tablet  Commonly known as:  ANTIVERT  Take 1 tablet (25 mg total) by mouth 3 (three) times daily as needed for dizziness.     predniSONE 10 MG tablet  Commonly known as:  DELTASONE  Prednisone taper: Take 6 tablets daily for one day; then 5 tablets the next day; then 4 tablets the next day; then 3 tablets the next day; then 2 tablets the next day; then 1 tablet the next day; then stop.       Allergies  Allergen Reactions  . Codeine Other (See Comments)    Makes patient not in right state of mind.   . Oxycodone Itching  . Talwin [Pentazocine] Other (See Comments)    Makes patient not in right state of mind.   . Valium [Diazepam] Other (See Comments)    Just doesn't work.        Follow-up Information   Follow up with Cassell Smiles., MD. Schedule an appointment as soon as possible for a visit in 1 week.   Specialty:  Internal Medicine   Contact information:   [address] [place] [address] [phone]        The results of significant diagnostics from this hospitalization (including imaging, microbiology, ancillary and laboratory) are listed below for reference.    Significant Diagnostic Studies: Dg Chest 2 View  08/10/2013   CLINICAL DATA:  Shortness of breath. Back pain. COPD. History  of smoking.  EXAM: CHEST  2 VIEW  COMPARISON:  07/1913  FINDINGS: The lungs are hyperinflated. There perihilar bronchitic changes. No focal consolidations or pleural effusions are identified. No edema. Visualized osseous structures have a normal appearance.  IMPRESSION: 1. COPD and bronchitic changes. 2.  No focal acute pulmonary abnormality.   Electronically Signed   By: Rosalie Gums M.D.   On: 08/10/2013 23:44   Dg Chest 2 View  07/20/2013   CLINICAL DATA:  Dizziness.  COPD.  Smoking history.  EXAM: CHEST  2 VIEW  COMPARISON:  05/31/2013  FINDINGS: Heart size is normal. There is calcification of the thoracic aorta up. The lungs are somewhat hyperinflated consistent with the clinical diagnosis of COPD, but there is no evidence of infiltrate, mass, effusion or collapse. Mild curvature degenerative change of the spine again noted.  IMPRESSION: Pulmonary hyperinflation.  No focal or active process otherwise.   Electronically Signed   By: Paulina Fusi M.D.   On: 07/20/2013 09:27   Ct Angio Chest Pe W/cm &/or Wo Cm  08/11/2013   CLINICAL DATA:  Increased dyspnea  EXAM: CT ANGIOGRAPHY CHEST WITH CONTRAST  TECHNIQUE: Multidetector CT imaging of the chest was performed using the standard protocol during bolus administration of intravenous contrast. Multiplanar CT image reconstructions including MIPs were obtained to evaluate the vascular anatomy.  CONTRAST:  OMNIPAQUE IOHEXOL 350 MG/ML SOLN  COMPARISON:  Prior radiograph from 08/10/2013  FINDINGS: No pathologically enlarged mediastinal, hilar, or axillary lymph nodes are identified.  Scattered calcified atheromatous disease is noted within the aortic arch and proximal great vessels. No intrathoracic aortic aneurysm.  Heart size is within normal limits. Diffuse 3 vessel coronary artery calcifications are present. No pericardial effusion.  Pulmonary arterial tree is well opacified. No filling defects are seen to suggest acute pulmonary embolism. Re-formatted  imaging confirms these findings.  Severe emphysematous changes are seen diffusely throughout the lungs, greatest within the upper lobes. Irregular biapical pleural thickening is noted. No focal infiltrate, pulmonary edema, or pleural effusion identified. Linear opacity within the lingula and subpleural right lung base most likely reflect atelectasis and/or scarring. Mild bronchiectasis noted within the right lower lobe.  Single 5 mm pulmonary nodule is present within the inferior right lower lobe (series 8, image 55). A few additional scattered 3-4 mm nodules are seen within the right middle lobe (series 8, image 57, 60). These findings are indeterminate.  Visualized upper abdomen is within normal limits. Incidental note is made of a partially visualized exophytic hypodense lesion extending from the anterior medial left kidney, incompletely evaluated.  Osseous structures are within normal limits. No focal lytic or blastic osseous lesions are identified.  IMPRESSION: 1. No CT evidence of acute pulmonary embolism. 2. Severe panlobular emphysema. 3. Indeterminate 4-5 mm nodules within the right upper and middle lobes as above. A followup study in 6 months could be performed to ensure stability/resolution as clinically indicated. Followup is based on the Fleischner society recommendations.   Electronically Signed   By: Rise Mu M.D.   On: 08/11/2013 03:12    Microbiology: No results found for this or any previous visit (from the past 240 hour(s)).   Labs: Basic Metabolic Panel:  Recent Labs Lab 08/10/13 2355 08/12/13 0515  NA 139 139  K 3.7 4.4  CL 99 105  CO2 26 24  GLUCOSE 158* 151*  BUN 26* 22  CREATININE 0.92 0.80  CALCIUM 10.2 8.9   Liver Function Tests:  Recent Labs Lab 08/10/13 2355  AST 15  ALT 10  ALKPHOS 108  BILITOT 0.2*  PROT 6.7  ALBUMIN 3.3*   No results found for this basename: LIPASE, AMYLASE,  in the last 168 hours No results found for this basename:  AMMONIA,  in the last 168 hours CBC:  Recent Labs Lab 08/10/13 2355 08/12/13 0515  WBC 9.1 19.6*  NEUTROABS 5.5  --   HGB 12.4 10.7*  HCT 38.1 33.7*  MCV 94.1 96.3  PLT 326 265   Cardiac Enzymes:  Recent Labs Lab 08/10/13 2355  TROPONINI <0.30   BNP: BNP (last 3 results)  Recent Labs  08/10/13 2355  PROBNP 80.4   CBG:  Recent Labs Lab 08/11/13 1132 08/11/13 1646 08/11/13 2214 08/12/13 0743 08/12/13 1133  GLUCAP 145* 114* 180* 123* 114*       Signed:  Elliot Cousin  [facility] 08/12/2013, 7:00 PM

## 2013-08-18 ENCOUNTER — Encounter (INDEPENDENT_AMBULATORY_CARE_PROVIDER_SITE_OTHER): Payer: Self-pay

## 2013-08-18 ENCOUNTER — Ambulatory Visit (INDEPENDENT_AMBULATORY_CARE_PROVIDER_SITE_OTHER): Payer: Medicare Other | Admitting: Otolaryngology

## 2013-08-18 DIAGNOSIS — J320 Chronic maxillary sinusitis: Secondary | ICD-10-CM

## 2013-08-18 DIAGNOSIS — J322 Chronic ethmoidal sinusitis: Secondary | ICD-10-CM

## 2013-08-23 ENCOUNTER — Telehealth: Payer: Self-pay | Admitting: *Deleted

## 2013-08-23 ENCOUNTER — Other Ambulatory Visit: Payer: Self-pay

## 2013-08-23 DIAGNOSIS — K5900 Constipation, unspecified: Secondary | ICD-10-CM

## 2013-08-23 NOTE — Telephone Encounter (Signed)
Pt's husband called stating pt is in a lot of and having a lot of trouble. Pt's husband would like to speak with [facility]. Pt can't go to the bathroom. Please [phone]

## 2013-08-23 NOTE — Telephone Encounter (Signed)
As long as she does not have any obstructive signs (severe abdominal distension, tightness, nausea, vomiting, etc), then let's have her go back to taking Linzess 290 mcg daily. Miralax BID as needed.   May need bowel purge now: may do Miralax 1 capful each hour up to 6 doses. Follow each capful with 8ounces of water, 30 minutes after.   Let's check TSH. May do fleets enema.

## 2013-08-23 NOTE — Telephone Encounter (Signed)
Called and informed pt. Lab order faxed to [facility] on [address]

## 2013-08-23 NOTE — Telephone Encounter (Signed)
I called pt and she said her neck and shoulders were hurting, and she hasn't had a BM for 5 days. I told her she needs to talk to PCP about her neck and shoulder pain.   For the constipation I will let Tobi Bastos know. She said she has been taking the Linzess 145 mcg 2-3 times a day and taking Miralax 2-3 times a day sometimes and still not had a BM in 5 days.  She said she had been having a BM about every other day before this. Then she said her upper legs feel sore and her stomach feels sore.  She is aware that Tobi Bastos is at the hospital and I will call pt back when Tobi Bastos addresses this.

## 2013-09-22 ENCOUNTER — Telehealth: Payer: Self-pay | Admitting: *Deleted

## 2013-09-22 NOTE — Telephone Encounter (Signed)
Pt called stating Dr. Oneida Alar has called her in linzess and RX is to expensive for a 30 day supply since she has changed her insurance, medication will cost her $215 what insurance told pt, and [facility] told pt it will cost $258. Pt would like to know if she can get a generic for linzess, pt now has [employer] insurance, pt uses [facility] in [place] for her pharmacy. Please advise [phone]. Pt states she has a appointment after lunch today so if you call her she said to [facility] and she will call back.

## 2013-09-23 MED ORDER — LUBIPROSTONE 24 MCG PO CAPS: ORAL_CAPSULE | ORAL | Status: DC

## 2013-09-23 NOTE — Telephone Encounter (Signed)
I called Lindsay Mitchell. She has only tried the Linzess ( other than Miralax that she takes several times a day). She sometimes will have a very small BM every day for awhile. But mostly, goes several days without any BM. This has been going on for many years.   She was getting some assistance with her meds, but this year she is not. She uses [facility]. Please advise!

## 2013-09-23 NOTE — Telephone Encounter (Signed)
PLEASE CALL PT. LINZESS DOES NOT COME IN A GENERIC FORM. SHE MAY TRY AMITIZA AGAIN AND CONTINUE MIRALAX TO TREAT HER CONSTIPATION. DRINK WATER TO KEEP URINE LIGHT YELLOW. FOLLOW A HIGH FIBER DIET. AVOID ITEMS THAT CAUSE BLOATING & GAS.

## 2013-09-23 NOTE — Telephone Encounter (Signed)
Called and informed pt.  

## 2013-09-29 ENCOUNTER — Ambulatory Visit (INDEPENDENT_AMBULATORY_CARE_PROVIDER_SITE_OTHER): Payer: Medicare Other | Admitting: Otolaryngology

## 2013-09-29 DIAGNOSIS — J322 Chronic ethmoidal sinusitis: Secondary | ICD-10-CM

## 2013-09-29 DIAGNOSIS — J320 Chronic maxillary sinusitis: Secondary | ICD-10-CM

## 2013-10-21 ENCOUNTER — Other Ambulatory Visit (HOSPITAL_COMMUNITY): Payer: Self-pay | Admitting: Family Medicine

## 2013-10-21 ENCOUNTER — Ambulatory Visit (HOSPITAL_COMMUNITY)
Admission: RE | Admit: 2013-10-21 | Discharge: 2013-10-21 | Disposition: A | Discharge status: Home / Self Care | Payer: Medicare Other | Source: Ambulatory Visit | Attending: Family Medicine | Admitting: Family Medicine

## 2013-10-21 DIAGNOSIS — R059 Cough, unspecified: Secondary | ICD-10-CM

## 2013-10-21 DIAGNOSIS — J4489 Other specified chronic obstructive pulmonary disease: Secondary | ICD-10-CM | POA: Insufficient documentation

## 2013-10-21 DIAGNOSIS — R05 Cough: Secondary | ICD-10-CM

## 2013-10-21 DIAGNOSIS — R079 Chest pain, unspecified: Secondary | ICD-10-CM

## 2013-10-21 DIAGNOSIS — J449 Chronic obstructive pulmonary disease, unspecified: Secondary | ICD-10-CM | POA: Insufficient documentation

## 2013-11-10 ENCOUNTER — Other Ambulatory Visit (HOSPITAL_COMMUNITY): Payer: Self-pay | Admitting: Internal Medicine

## 2013-11-10 DIAGNOSIS — R1011 Right upper quadrant pain: Secondary | ICD-10-CM

## 2013-11-14 ENCOUNTER — Ambulatory Visit (HOSPITAL_COMMUNITY)
Admission: RE | Admit: 2013-11-14 | Discharge: 2013-11-14 | Disposition: A | Discharge status: Home / Self Care | Payer: Medicare Other | Source: Ambulatory Visit | Attending: Internal Medicine | Admitting: Internal Medicine

## 2013-11-14 ENCOUNTER — Other Ambulatory Visit (HOSPITAL_COMMUNITY): Payer: Self-pay | Admitting: Internal Medicine

## 2013-11-14 DIAGNOSIS — R1011 Right upper quadrant pain: Secondary | ICD-10-CM

## 2013-11-23 NOTE — Progress Notes (Signed)
TCS OCT 2014 TICS/IH, REDUNDANT COLON. OPV E30 CONSTIPATION

## 2013-11-24 ENCOUNTER — Encounter: Payer: Self-pay | Admitting: Gastroenterology

## 2013-11-24 NOTE — Progress Notes (Signed)
Mailed letter to patient to call office to set up OV °

## 2013-11-25 ENCOUNTER — Emergency Department (HOSPITAL_COMMUNITY): Payer: Medicare Other

## 2013-11-25 ENCOUNTER — Encounter (HOSPITAL_COMMUNITY): Payer: Self-pay | Admitting: Emergency Medicine

## 2013-11-25 ENCOUNTER — Emergency Department (HOSPITAL_COMMUNITY)
Admission: EM | Admit: 2013-11-25 | Discharge: 2013-11-25 | Disposition: A | Discharge status: Home / Self Care | Payer: Medicare Other | Attending: Emergency Medicine | Admitting: Emergency Medicine

## 2013-11-25 DIAGNOSIS — R51 Headache: Secondary | ICD-10-CM

## 2013-11-25 DIAGNOSIS — Z792 Long term (current) use of antibiotics: Secondary | ICD-10-CM | POA: Insufficient documentation

## 2013-11-25 DIAGNOSIS — Z8673 Personal history of transient ischemic attack (TIA), and cerebral infarction without residual deficits: Secondary | ICD-10-CM | POA: Insufficient documentation

## 2013-11-25 DIAGNOSIS — G43909 Migraine, unspecified, not intractable, without status migrainosus: Secondary | ICD-10-CM | POA: Insufficient documentation

## 2013-11-25 DIAGNOSIS — F17200 Nicotine dependence, unspecified, uncomplicated: Secondary | ICD-10-CM | POA: Insufficient documentation

## 2013-11-25 DIAGNOSIS — Z8679 Personal history of other diseases of the circulatory system: Secondary | ICD-10-CM | POA: Insufficient documentation

## 2013-11-25 DIAGNOSIS — Z79899 Other long term (current) drug therapy: Secondary | ICD-10-CM | POA: Insufficient documentation

## 2013-11-25 DIAGNOSIS — R519 Headache, unspecified: Secondary | ICD-10-CM

## 2013-11-25 DIAGNOSIS — Z86718 Personal history of other venous thrombosis and embolism: Secondary | ICD-10-CM | POA: Insufficient documentation

## 2013-11-25 DIAGNOSIS — Z8719 Personal history of other diseases of the digestive system: Secondary | ICD-10-CM | POA: Insufficient documentation

## 2013-11-25 DIAGNOSIS — F341 Dysthymic disorder: Secondary | ICD-10-CM | POA: Insufficient documentation

## 2013-11-25 DIAGNOSIS — J449 Chronic obstructive pulmonary disease, unspecified: Secondary | ICD-10-CM | POA: Insufficient documentation

## 2013-11-25 DIAGNOSIS — IMO0002 Reserved for concepts with insufficient information to code with codable children: Secondary | ICD-10-CM | POA: Insufficient documentation

## 2013-11-25 DIAGNOSIS — K5900 Constipation, unspecified: Secondary | ICD-10-CM | POA: Insufficient documentation

## 2013-11-25 DIAGNOSIS — J4489 Other specified chronic obstructive pulmonary disease: Secondary | ICD-10-CM | POA: Insufficient documentation

## 2013-11-25 HISTORY — DX: Migraine, unspecified, not intractable, without status migrainosus: G43.909

## 2013-11-25 LAB — BASIC METABOLIC PANEL
BUN: 21 mg/dL (ref 6–23)
CO2: 32 mEq/L (ref 19–32)
Calcium: 10.4 mg/dL (ref 8.4–10.5)
Chloride: 96 mEq/L (ref 96–112)
Creatinine, Ser: 0.81 mg/dL (ref 0.50–1.10)
GFR calc Af Amer: 83 mL/min — ABNORMAL LOW (ref >90)
GFR calc non Af Amer: 72 mL/min — ABNORMAL LOW (ref >90)
Glucose, Bld: 133 mg/dL — ABNORMAL HIGH (ref 70–99)
Potassium: 4.1 mEq/L (ref 3.7–5.3)
Sodium: 142 mEq/L (ref 137–147)

## 2013-11-25 LAB — CBC WITH DIFFERENTIAL/PLATELET
Basophils Absolute: 0 10*3/uL (ref 0.0–0.1)
Basophils Relative: 0 % (ref 0–1)
Eosinophils Absolute: 0.1 10*3/uL (ref 0.0–0.7)
Eosinophils Relative: 1 % (ref 0–5)
HCT: 49.7 % — ABNORMAL HIGH (ref 36.0–46.0)
Hemoglobin: 16 g/dL — ABNORMAL HIGH (ref 12.0–15.0)
Lymphocytes Relative: 18 % (ref 12–46)
Lymphs Abs: 1.7 10*3/uL (ref 0.7–4.0)
MCH: 30 pg (ref 26.0–34.0)
MCHC: 32.2 g/dL (ref 30.0–36.0)
MCV: 93.1 fL (ref 78.0–100.0)
Monocytes Absolute: 0.8 10*3/uL (ref 0.1–1.0)
Monocytes Relative: 8 % (ref 3–12)
Neutro Abs: 6.8 10*3/uL (ref 1.7–7.7)
Neutrophils Relative %: 73 % (ref 43–77)
Platelets: 309 10*3/uL (ref 150–400)
RBC: 5.34 MIL/uL — ABNORMAL HIGH (ref 3.87–5.11)
RDW: 14.7 % (ref 11.5–15.5)
WBC: 9.4 10*3/uL (ref 4.0–10.5)

## 2013-11-25 LAB — SEDIMENTATION RATE: Sed Rate: 3 mm/hr (ref 0–22)

## 2013-11-25 MED ORDER — DIPHENHYDRAMINE HCL 50 MG/ML IJ SOLN
12.5000 mg | Freq: Once | INTRAMUSCULAR | Status: AC
  Administered 2013-11-25: 12.5 mg via INTRAVENOUS
  Filled 2013-11-25: qty 1

## 2013-11-25 MED ORDER — SODIUM CHLORIDE 0.9 % IV BOLUS (SEPSIS)
1000.0000 mL | Freq: Once | INTRAVENOUS | Status: AC
  Administered 2013-11-25: 1000 mL via INTRAVENOUS

## 2013-11-25 MED ORDER — ONDANSETRON HCL 4 MG/2ML IJ SOLN
4.0000 mg | Freq: Once | INTRAMUSCULAR | Status: AC
  Administered 2013-11-25: 4 mg via INTRAVENOUS
  Filled 2013-11-25: qty 2

## 2013-11-25 MED ORDER — FENTANYL CITRATE 0.05 MG/ML IJ SOLN
50.0000 ug | Freq: Once | INTRAMUSCULAR | Status: AC
Start: 2013-11-25 — End: 2013-11-25
  Administered 2013-11-25: 50 ug via INTRAVENOUS
  Filled 2013-11-25: qty 2

## 2013-11-25 MED ORDER — KETOROLAC TROMETHAMINE 30 MG/ML IJ SOLN
15.0000 mg | Freq: Once | INTRAMUSCULAR | Status: AC
  Administered 2013-11-25: 15 mg via INTRAVENOUS
  Filled 2013-11-25: qty 1

## 2013-11-25 NOTE — Discharge Instructions (Signed)

## 2013-11-25 NOTE — ED Notes (Addendum)
Pain rt side of head , behind rt eye and rt gums , Nausea, vomiting.  No diarrhea.   Hx of migraines, but says this does not feel like a migraine to her.

## 2013-11-25 NOTE — ED Provider Notes (Signed)
CSN: [id number]     Arrival date & time 11/25/13  1731 History  This chart was scribed for Lindsay Mitchell B. Karle Starch, MD by Eston Mould, ED Scribe. This patient was seen in room APA09/APA09 and the patient's care was started at 6:29 PM.   Chief Complaint  Patient presents with  . Headache   The history is provided by the patient and the spouse. No language interpreter was used.   HPI Comments: Lindsay Mitchell is a 73 y.o. female who presents to the Emergency Department complaining of ongoing HA that began 1.5 days ago. Pt states she initially began having gum pain and eventually began having a HA and reports having pain behind her R eye. Pt states she had a "problem and pain with her gums and back of eye pain during summer of 2014" and states she was seen by  ENT and suspect she was having "problems with her sinuses". Pt states the pain is intermittent and denies having shooting or radiating pain. She states she has not been able to eat due to HA and emesis. Pt states she takes Asprin daily and denies taking any other blood thinners.  Pt denies having abd pain, photophobia, nasal congestion, sore throat.  Past Medical History  Diagnosis Date  . Constipation   . Depression with anxiety   . Diverticulosis   . DVT (deep venous thrombosis)   . COPD (chronic obstructive pulmonary disease)     Emphysema radiographically  . Carotid artery occlusion   . Anxiety   . TIA (transient ischemic attack)   . Full dentures   . Pulmonary nodules 08/11/2013  . Migraine    Past Surgical History  Procedure Laterality Date  . Abdominal hysterectomy    . Back surgery      X3  . Colonoscopy  June 2002    Dr. Ferdinand Lango: severe diverticular disease with haustral hypertrophy, primary and secondary diverticulosis, persistent spasticity, early stenosis  . Colonoscopy with propofol N/A 06/21/2013    Procedure: COLONOSCOPY WITH PROPOFOL;  Surgeon: Danie Binder, MD;  Location: [facility];  Service: Endoscopy;   Laterality: N/A;  in cecum at 0811; total time = 14 minutes  . Tonsillectomy    . Appendectomy    . Eye surgery      both cataracts-lenses  . Nasal sinus surgery Right 07/11/2013    Procedure: RIGHT ENDOSCOPIC ANTERIOR ETHMOIDECTOMY/RIGHT ENDOSCOPIC MAXILLARY ANTROSTOMY WITH REMOVAL OF TISSUE;  Surgeon: Ascencion Dike, MD;  Location: [facility];  Service: ENT;  Laterality: Right;   Family History  Problem Relation Age of Onset  . Colon cancer Neg Hx   . Cancer Mother   . Heart disease Mother   . Hypertension Mother   . Heart attack Mother   . Other Mother     varicose veins  . Diabetes Brother   . Hypertension Brother    History  Substance Use Topics  . Smoking status: Current Some Day Smoker -- 1.00 packs/day for 50 years    Types: Cigarettes  . Smokeless tobacco: Not on file     Comment: smoked X 60 years  . Alcohol Use: No   OB History   Grav Para Term Preterm Abortions TAB SAB Ect Mult Living                 Review of SystemsA complete 10 system review of systems was obtained and all systems are negative except as noted in the HPI and PMH.   Allergies  Codeine; Oxycodone; Talwin; and Valium  Home Medications   Current Outpatient Rx  Name  Route  Sig  Dispense  Refill  . albuterol (PROVENTIL HFA;VENTOLIN HFA) 108 (90 BASE) MCG/ACT inhaler   Inhalation   Inhale 2 puffs into the lungs every 4 (four) hours as needed for wheezing or shortness of breath.   1 Inhaler   12   . aspirin EC 325 MG tablet   Oral   Take 325 mg by mouth daily.         . beclomethasone (QVAR) 40 MCG/ACT inhaler   Inhalation   Inhale 2 puffs into the lungs daily.         Marland Kitchen gabapentin (NEURONTIN) 300 MG capsule   Oral   Take 300 mg by mouth at bedtime.         Marland Kitchen HYDROcodone-acetaminophen (NORCO) 7.5-325 MG per tablet   Oral   Take 1 tablet by mouth 2 (two) times daily as needed. For pain         . ipratropium-albuterol (DUONEB) 0.5-2.5 (3) MG/3ML SOLN    Nebulization   Take 3 mLs by nebulization 3 (three) times daily.         Marland Kitchen levofloxacin (LEVAQUIN) 500 MG tablet   Oral   Take 1 tablet (500 mg total) by mouth daily. Starting tomorrow, take antibiotic for 5 more days.   5 tablet   0   . lubiprostone (AMITIZA) 24 MCG capsule      1 PO QHS FOR 7 DAYS THEN 1 PO BID. TAKE WITH FOOD.   62 capsule   11   . meclizine (ANTIVERT) 25 MG tablet   Oral   Take 1 tablet (25 mg total) by mouth 3 (three) times daily as needed for dizziness.   20 tablet   0   . predniSONE (DELTASONE) 10 MG tablet      Prednisone taper: Take 6 tablets daily for one day; then 5 tablets the next day; then 4 tablets the next day; then 3 tablets the next day; then 2 tablets the next day; then 1 tablet the next day; then stop.   21 tablet   0    BP 128/81  Pulse 76  Temp(Src) 97.8 F (36.6 C)  Resp 16  SpO2 99%  Physical Exam  Nursing note and vitals reviewed. Constitutional: She is oriented to person, place, and time. She appears well-developed and well-nourished.  HENT:  Head: Normocephalic and atraumatic.  Tenderness to R temple and R cheek.   Eyes: EOM are normal. Pupils are equal, round, and reactive to light.  Neck: Normal range of motion. Neck supple.  Cardiovascular: Normal rate, normal heart sounds and intact distal pulses.   Pulmonary/Chest: Effort normal and breath sounds normal.  Abdominal: Bowel sounds are normal. She exhibits no distension. There is no tenderness.  Musculoskeletal: Normal range of motion. She exhibits no edema and no tenderness.  Neurological: She is alert and oriented to person, place, and time. She has normal strength. No cranial nerve deficit or sensory deficit.  Skin: Skin is warm and dry. No rash noted.  Psychiatric: She has a normal mood and affect.   ED Course  Procedures  DIAGNOSTIC STUDIES: Oxygen Saturation is 99% on RA, normal by my interpretation.    COORDINATION OF CARE: 6:31 PM-Discussed treatment plan  which includes administer Zofran while in ED and labs. Pt agreed to plan.   9:20 PM- Re-evaluated pt. Discussed lab and radiology findings with patient.   Labs Review  Labs Reviewed  CBC WITH DIFFERENTIAL - Abnormal; Notable for the following:    RBC 5.34 (*)    Hemoglobin 16.0 (*)    HCT 49.7 (*)    All other components within normal limits  BASIC METABOLIC PANEL - Abnormal; Notable for the following:    Glucose, Bld 133 (*)    GFR calc non Af Amer 72 (*)    GFR calc Af Amer 83 (*)    All other components within normal limits  SEDIMENTATION RATE   Imaging Review Ct Head Wo Contrast  11/25/2013   CLINICAL DATA:  Headache, vomiting, aspirin use.  EXAM: CT HEAD WITHOUT CONTRAST  TECHNIQUE: Contiguous axial images were obtained from the base of the skull through the vertex without intravenous contrast.  COMPARISON:  CT HEAD W/O CM dated 05/10/2013  FINDINGS: The ventricles and sulci are normal for age. No intraparenchymal hemorrhage, mass effect nor midline shift. Patchy supratentorial white matter hypodensities are within normal range for patient's age and though non-specific suggest sequelae of chronic small vessel ischemic disease. No acute large vascular territory infarcts.  No abnormal extra-axial fluid collections. Basal cisterns are patent. Moderate calcific atherosclerosis of the carotid siphons. Cerebellar tonsils at the not below the foramen magnum. Minimal soft tissue density within left mastoid tip, visualized paranasal sinuses are well-aerated. The included ocular globes and orbital contents are non-suspicious. Status post bilateral ocular lens implants.  IMPRESSION: No acute intracranial process. Stable appearance of the head from May 10, 2013.   Electronically Signed   By: Elon Alas   On: 11/25/2013 19:04     EKG Interpretation None     MDM   Final diagnoses:  Headache    CT and labs reviewed, unremarkable. Pain improved with toradol. No elevated sed rate,  unlikely to be temporal arteritis, doubt SAH, stroke, acute glaucoma or other emergent condition. To be discharged with PCP followup.   I personally performed the services described in this documentation, which was scribed in my presence. The recorded information has been reviewed and is accurate.      Laren Whaling B. Karle Starch, MD 11/25/13 2237

## 2013-12-20 ENCOUNTER — Encounter: Payer: Self-pay | Admitting: *Deleted

## 2014-01-18 ENCOUNTER — Ambulatory Visit: Payer: Medicare Other | Admitting: Gastroenterology

## 2014-02-15 ENCOUNTER — Encounter: Payer: Self-pay | Admitting: Gastroenterology

## 2014-02-15 ENCOUNTER — Encounter (INDEPENDENT_AMBULATORY_CARE_PROVIDER_SITE_OTHER): Payer: Self-pay

## 2014-02-15 ENCOUNTER — Ambulatory Visit (INDEPENDENT_AMBULATORY_CARE_PROVIDER_SITE_OTHER): Payer: Medicare Other | Admitting: Gastroenterology

## 2014-02-15 VITALS — BP 116/67 | HR 80 | Temp 97.2°F | Resp 18 | Ht 63.0 in | Wt 114.0 lb

## 2014-02-15 DIAGNOSIS — R1013 Epigastric pain: Secondary | ICD-10-CM | POA: Insufficient documentation

## 2014-02-15 DIAGNOSIS — K3189 Other diseases of stomach and duodenum: Secondary | ICD-10-CM

## 2014-02-15 DIAGNOSIS — K5900 Constipation, unspecified: Secondary | ICD-10-CM

## 2014-02-15 DIAGNOSIS — R1310 Dysphagia, unspecified: Secondary | ICD-10-CM | POA: Insufficient documentation

## 2014-02-15 NOTE — Patient Instructions (Signed)
We have scheduled you for an upper endoscopy with dilation in the near future.   Further recommendations to follow!

## 2014-02-15 NOTE — Progress Notes (Signed)
Referring Provider: Redmond School, MD Primary Care Physician:  Glo Herring., MD Primary GI: Dr. Oneida Alar   Chief Complaint  Patient presents with  . Referral    HPI:   Lindsay Mitchell presents today in follow-up at the request of Dr. Gerarda Fraction secondary to persistent abdominal pain and recent H.pylori positive serologies. Tested for H.pylori a few months ago. Treated with Biaxin and Amoxicillin but was unable to tolerate the Biaxin full course.   Breaks out in a sticky sweat, then freezes, becomes weak, vomits. No precipitating or relieving factors.  Symptoms started a few months ago. New onset reflux symptoms starting around this time. Intermittent symptoms now. Decreased appetite. Sometimes solid food dysphagia. Cornbread and milk causes problems. Notes upper abdominal pain, feels same as in remote past when she was told she had "bleeding ulcers". Can't remember if she had an upper endoscopy or not. Performed in [place].   Colonoscopy on file from Oct 2014.   Past Medical History  Diagnosis Date  . Constipation   . Depression with anxiety   . Diverticulosis   . DVT (deep venous thrombosis)   . COPD (chronic obstructive pulmonary disease)     Emphysema radiographically  . Carotid artery occlusion   . Anxiety   . TIA (transient ischemic attack)   . Full dentures   . Pulmonary nodules 08/11/2013  . Migraine     Past Surgical History  Procedure Laterality Date  . Abdominal hysterectomy    . Back surgery      X3  . Colonoscopy  June 2002    Dr. Ferdinand Lango: severe diverticular disease with haustral hypertrophy, primary and secondary diverticulosis, persistent spasticity, early stenosis  . Colonoscopy with propofol N/A 06/21/2013    Dr. Oneida Alar: sigmoid colon and descending colon with diverticula, small internal hemorrhoids  . Tonsillectomy    . Appendectomy    . Eye surgery      both cataracts-lenses  . Nasal sinus surgery Right 07/11/2013    Procedure: RIGHT ENDOSCOPIC  ANTERIOR ETHMOIDECTOMY/RIGHT ENDOSCOPIC MAXILLARY ANTROSTOMY WITH REMOVAL OF TISSUE;  Surgeon: Ascencion Dike, MD;  Location: [facility];  Service: ENT;  Laterality: Right;    Current Outpatient Prescriptions  Medication Sig Dispense Refill  . albuterol (PROVENTIL HFA;VENTOLIN HFA) 108 (90 BASE) MCG/ACT inhaler Inhale 2 puffs into the lungs every 4 (four) hours as needed for wheezing or shortness of breath.  1 Inhaler  12  . aspirin EC 325 MG tablet Take 325 mg by mouth daily.      Marland Kitchen gabapentin (NEURONTIN) 300 MG capsule Take 300 mg by mouth 3 (three) times daily.       Marland Kitchen HYDROcodone-acetaminophen (NORCO) 10-325 MG per tablet Take 1 tablet by mouth every 6 (six) hours as needed for moderate pain or severe pain.      Marland Kitchen ipratropium-albuterol (DUONEB) 0.5-2.5 (3) MG/3ML SOLN Take 3 mLs by nebulization 3 (three) times daily.      Marland Kitchen LINZESS 145 MCG CAPS capsule Take 145 mcg by mouth daily.      . polyethylene glycol powder (GLYCOLAX/MIRALAX) powder Take by mouth as needed.       No current facility-administered medications for this visit.    Allergies as of 02/15/2014 - Review Complete 02/15/2014  Allergen Reaction Noted  . Codeine Other (See Comments) 06/11/2012  . Oxycodone Itching 08/11/2013  . Talwin [pentazocine] Other (See Comments) 06/11/2012  . Valium [diazepam] Other (See Comments) 10/28/2012    Family History  Problem  Relation Age of Onset  . Colon cancer Neg Hx   . Cancer Mother   . Heart disease Mother   . Hypertension Mother   . Heart attack Mother   . Other Mother     varicose veins  . Diabetes Brother   . Hypertension Brother     History   Social History  . Marital Status: Married    Spouse Name: N/A    Number of Children: 4  . Years of Education: N/A   Social History Main Topics  . Smoking status: Current Some Day Smoker -- 1.00 packs/day for 50 years    Types: Cigarettes  . Smokeless tobacco: None     Comment: smoked X 60 years  . Alcohol Use:  No  . Drug Use: No  . Sexual Activity: Yes    Birth Control/ Protection: Surgical   Other Topics Concern  . None   Social History Narrative  . None    Review of Systems: As mentioned in HPI.   Physical Exam: BP 116/67  Pulse 80  Temp(Src) 97.2 F (36.2 C) (Oral)  Resp 18  Ht 5\' 3"  (1.6 m)  Wt 114 lb (51.71 kg)  BMI 20.20 kg/m2 General:   Alert and oriented. No distress noted. Pleasant and cooperative.  Head:  Normocephalic and atraumatic. Eyes:  Conjuctiva clear without scleral icterus. Mouth:  Oral mucosa pink and moist. Good dentition. No lesions. Heart:  S1, S2 present without murmurs, rubs, or gallops. Regular rate and rhythm. Abdomen:  +BS, soft, mild TTP upper abdomen, LUQ and non-distended. No rebound or guarding. No HSM or masses noted. Msk:  Symmetrical without gross deformities. Normal posture. Extremities:  Without edema. Neurologic:  Alert and  oriented x4;  grossly normal neurologically. Skin:  Intact without significant lesions or rashes. Psych:  Alert and cooperative. Normal mood and affect.  US abdomen March 2015 : normal gallbladder, probable mild hepatic steatosis.

## 2014-02-19 ENCOUNTER — Encounter: Payer: Self-pay | Admitting: Gastroenterology

## 2014-02-20 ENCOUNTER — Encounter (HOSPITAL_COMMUNITY): Payer: Self-pay | Admitting: Pharmacy Technician

## 2014-02-20 ENCOUNTER — Telehealth: Payer: Self-pay | Admitting: Gastroenterology

## 2014-02-20 ENCOUNTER — Other Ambulatory Visit: Payer: Self-pay | Admitting: Gastroenterology

## 2014-02-20 DIAGNOSIS — R1319 Other dysphagia: Secondary | ICD-10-CM

## 2014-02-20 DIAGNOSIS — R1013 Epigastric pain: Secondary | ICD-10-CM

## 2014-02-20 MED ORDER — OMEPRAZOLE 20 MG PO CPDR: 20.0000 mg | DELAYED_RELEASE_CAPSULE | Freq: Every day | ORAL | Status: DC

## 2014-02-20 NOTE — Assessment & Plan Note (Signed)
Consider Linzess 145 mcg daily. Colonoscopy up-to-date.

## 2014-02-20 NOTE — Assessment & Plan Note (Signed)
Question web, ring, stricture, esophagitis, less likely malignancy. Dilation at time of EGD.

## 2014-02-20 NOTE — Assessment & Plan Note (Signed)
73 year old female with new onset upper abdominal pain, occasional reflux, decreased appetite, with intermittent solid food dysphagia. Currently no PPI. [place] of abdomen with probably fatty liver but normal gallbladder. No concerning exam findings. Needs EGD/ED with Dr. Oneida Alar in near future for further assessment. Add PPI. Consider HIDA if negative EGD.

## 2014-02-20 NOTE — Telephone Encounter (Signed)
I sent Prilosec to pharmacy. Please have patient take each morning, 30 minutes prior to breakfast. I did not give this to her at her appt. Thanks!

## 2014-02-21 NOTE — Progress Notes (Signed)
REVIEWED.  

## 2014-02-21 NOTE — Telephone Encounter (Signed)
Called and informed pt.  

## 2014-02-21 NOTE — Progress Notes (Signed)
cc'd to pcp 

## 2014-03-06 ENCOUNTER — Encounter (HOSPITAL_COMMUNITY): Payer: Self-pay | Admitting: *Deleted

## 2014-03-06 ENCOUNTER — Encounter (HOSPITAL_COMMUNITY)
Admission: RE | Disposition: A | Discharge status: Home / Self Care | Payer: Self-pay | Source: Ambulatory Visit | Attending: Gastroenterology

## 2014-03-06 ENCOUNTER — Ambulatory Visit (HOSPITAL_COMMUNITY)
Admission: RE | Admit: 2014-03-06 | Discharge: 2014-03-06 | Disposition: A | Discharge status: Home / Self Care | Payer: Medicare Other | Source: Ambulatory Visit | Attending: Gastroenterology | Admitting: Gastroenterology

## 2014-03-06 DIAGNOSIS — R1310 Dysphagia, unspecified: Secondary | ICD-10-CM

## 2014-03-06 DIAGNOSIS — Z79899 Other long term (current) drug therapy: Secondary | ICD-10-CM | POA: Insufficient documentation

## 2014-03-06 DIAGNOSIS — R1319 Other dysphagia: Secondary | ICD-10-CM

## 2014-03-06 DIAGNOSIS — F411 Generalized anxiety disorder: Secondary | ICD-10-CM | POA: Insufficient documentation

## 2014-03-06 DIAGNOSIS — J449 Chronic obstructive pulmonary disease, unspecified: Secondary | ICD-10-CM | POA: Insufficient documentation

## 2014-03-06 DIAGNOSIS — F329 Major depressive disorder, single episode, unspecified: Secondary | ICD-10-CM | POA: Insufficient documentation

## 2014-03-06 DIAGNOSIS — K2990 Gastroduodenitis, unspecified, without bleeding: Secondary | ICD-10-CM

## 2014-03-06 DIAGNOSIS — K2970 Gastritis, unspecified, without bleeding: Secondary | ICD-10-CM | POA: Insufficient documentation

## 2014-03-06 DIAGNOSIS — R1013 Epigastric pain: Secondary | ICD-10-CM

## 2014-03-06 DIAGNOSIS — J4489 Other specified chronic obstructive pulmonary disease: Secondary | ICD-10-CM | POA: Insufficient documentation

## 2014-03-06 DIAGNOSIS — Z7982 Long term (current) use of aspirin: Secondary | ICD-10-CM | POA: Insufficient documentation

## 2014-03-06 DIAGNOSIS — K222 Esophageal obstruction: Secondary | ICD-10-CM | POA: Insufficient documentation

## 2014-03-06 DIAGNOSIS — F17200 Nicotine dependence, unspecified, uncomplicated: Secondary | ICD-10-CM | POA: Insufficient documentation

## 2014-03-06 DIAGNOSIS — F3289 Other specified depressive episodes: Secondary | ICD-10-CM | POA: Insufficient documentation

## 2014-03-06 DIAGNOSIS — K3189 Other diseases of stomach and duodenum: Secondary | ICD-10-CM

## 2014-03-06 HISTORY — PX: BIOPSY: SHX5522

## 2014-03-06 HISTORY — PX: ESOPHAGOGASTRODUODENOSCOPY: SHX5428

## 2014-03-06 HISTORY — PX: SAVORY DILATION: SHX5439

## 2014-03-06 SURGERY — EGD (ESOPHAGOGASTRODUODENOSCOPY)
Anesthesia: Moderate Sedation

## 2014-03-06 MED ORDER — MIDAZOLAM HCL 5 MG/5ML IJ SOLN
INTRAMUSCULAR | Status: DC | PRN
  Administered 2014-03-06: 2 mg via INTRAVENOUS

## 2014-03-06 MED ORDER — SODIUM CHLORIDE 0.9 % IV SOLN
INTRAVENOUS | Status: DC
  Administered 2014-03-06: 1000 mL via INTRAVENOUS

## 2014-03-06 MED ORDER — MINERAL OIL PO OIL
TOPICAL_OIL | ORAL | Status: AC
  Filled 2014-03-06: qty 30

## 2014-03-06 MED ORDER — MEPERIDINE HCL 100 MG/ML IJ SOLN
INTRAMUSCULAR | Status: DC | PRN
  Administered 2014-03-06: 50 mg via INTRAVENOUS

## 2014-03-06 MED ORDER — SODIUM CHLORIDE 0.9 % IJ SOLN
INTRAMUSCULAR | Status: AC
  Filled 2014-03-06: qty 10

## 2014-03-06 MED ORDER — PROMETHAZINE HCL 25 MG/ML IJ SOLN
INTRAMUSCULAR | Status: AC
  Filled 2014-03-06: qty 1

## 2014-03-06 MED ORDER — PROMETHAZINE HCL 25 MG/ML IJ SOLN
12.5000 mg | Freq: Once | INTRAMUSCULAR | Status: AC
  Administered 2014-03-06: 12.5 mg via INTRAVENOUS

## 2014-03-06 MED ORDER — LIDOCAINE VISCOUS 2 % MT SOLN
OROMUCOSAL | Status: AC
  Filled 2014-03-06: qty 15

## 2014-03-06 MED ORDER — MIDAZOLAM HCL 5 MG/5ML IJ SOLN
INTRAMUSCULAR | Status: AC
  Filled 2014-03-06: qty 10

## 2014-03-06 MED ORDER — LIDOCAINE VISCOUS 2 % MT SOLN
OROMUCOSAL | Status: DC | PRN
  Administered 2014-03-06: 3 mL via OROMUCOSAL

## 2014-03-06 MED ORDER — STERILE WATER FOR IRRIGATION IR SOLN
Status: DC | PRN
  Administered 2014-03-06: 10:00:00

## 2014-03-06 MED ORDER — MEPERIDINE HCL 100 MG/ML IJ SOLN
INTRAMUSCULAR | Status: AC
  Filled 2014-03-06: qty 2

## 2014-03-06 NOTE — Discharge Instructions (Addendum)
I STRETCHED your esophagus. You have a small hiatal hernia. You have gastritis. I biopsied your stomach.   CONTINUE PRILOSEC 30 MINUTES PRIOR TO BREAKFAST EVERY MORNING.  YOUR BIOPSY WILL BE BACK IN 7 DAYS  FOLLOW UP APPT IN 3 MONTHS.    UPPER ENDOSCOPY AFTER CARE Read the instructions outlined below and refer to this sheet in the next week. These discharge instructions provide you with general information on caring for yourself after you leave the hospital. While your treatment has been planned according to the most current medical practices available, unavoidable complications occasionally occur. If you have any problems or questions after discharge, call DR. FIELDS, [phone].  ACTIVITY  You may resume your regular activity, but move at a slower pace for the next 24 hours.   Take frequent rest periods for the next 24 hours.   Walking will help get rid of the air and reduce the bloated feeling in your belly (abdomen).   No driving for 24 hours (because of the medicine (anesthesia) used during the test).   You may shower.   Do not sign any important legal documents or operate any machinery for 24 hours (because of the anesthesia used during the test).    NUTRITION  Drink plenty of fluids.   You may resume your normal diet as instructed by your doctor.   Begin with a light meal and progress to your normal diet. Heavy or fried foods are harder to digest and may make you feel sick to your stomach (nauseated).   Avoid alcoholic beverages for 24 hours or as instructed.    MEDICATIONS  You may resume your normal medications.   WHAT YOU CAN EXPECT TODAY  Some feelings of bloating in the abdomen.   Passage of more gas than usual.    IF YOU HAD A BIOPSY TAKEN DURING THE UPPER ENDOSCOPY:  Eat a soft diet IF YOU HAVE NAUSEA, BLOATING, ABDOMINAL PAIN, OR VOMITING.    FINDING OUT THE RESULTS OF YOUR TEST Not all test results are available during your visit. DR. Oneida Alar  WILL CALL YOU WITHIN 7 DAYS OF YOUR PROCEDUE WITH YOUR RESULTS. Do not assume everything is normal if you have not heard from DR. FIELDS IN ONE WEEK, CALL HER OFFICE AT [phone].  SEEK IMMEDIATE MEDICAL ATTENTION AND CALL THE OFFICE: [phone] IF:  You have more than a spotting of blood in your stool.   Your belly is swollen (abdominal distention).   You are nauseated or vomiting.   You have a temperature over 101F.   You have abdominal pain or discomfort that is severe or gets worse throughout the day.   Gastritis  Gastritis is an inflammation (the body's way of reacting to injury and/or infection) of the stomach. It can also be caused BY ASPIRIN, BC/GOODY POWDER'S, (IBUPROFEN) MOTRIN, OR ALEVE (NAPROXEN), chemicals (including alcohol), SPICY FOODS, and medications. This illness may be associated with generalized malaise (feeling tired, not well), UPPER ABDOMINAL STOMACH cramps, and fever. One common bacterial cause of gastritis is an organism known as H. Pylori. This can be treated with antibiotics.   Hiatal Hernia A hiatal hernia occurs when a part of the stomach slides above the diaphragm. The diaphragm is the thin muscle separating the belly (abdomen) from the chest. A hiatal hernia can be something you are born with or develop over time. Hiatal hernias may allow stomach acid to flow back into your esophagus, the tube which carries food from your mouth to your stomach. If this  acid causes problems it is called GERD (gastro-esophageal reflux disease).   SYMPTOMS Common symptoms of GERD are heartburn (burning in your chest). This is worse when lying down or bending over. It may also cause belching and indigestion. Some of the things which make GERD worse are:  Increased weight pushes on stomach making acid rise more easily.   Smoking markedly increases acid production.   Alcohol decreases lower esophageal sphincter pressure (valve between stomach and esophagus), allowing acid  from stomach into esophagus.   Late evening meals and going to bed with a full stomach increases pressure.   Anything that causes an increase in acid production.    HOME CARE INSTRUCTIONS  Try to achieve and maintain an ideal body weight.   Avoid drinking alcoholic beverages.   DO NOT smokE.   Do not wear tight clothing around your chest or stomach.   Eat smaller meals and eat more frequently. This keeps your stomach from getting too full. Eat slowly.   Do not lie down for 2 or 3 hours after eating. Do not eat or drink anything 1 to 2 hours before going to bed.   Avoid caffeine beverages (colas, coffee, cocoa, tea), fatty foods, citrus fruits and all other foods and drinks that contain acid and that seem to increase the problems.   Avoid bending over, especially after eating OR STRAINING. Anything that increases the pressure in your belly increases the amount of acid that may be pushed up into your esophagus.    ESOPHAGEAL STRICTURE  Esophageal strictures can be caused by stomach acid backing up into the tube that carries food from the mouth down to the stomach (lower esophagus).  TREATMENT There are a number of medicines used to treat reflux/stricture, including: Antacids.  ZANTAC Proton-pump inhibitors: OMEPRAZOLE  HOME CARE INSTRUCTIONS Eat 2-3 hours before going to bed.  Try to reach and maintain a healthy weight.  Do not eat just a few very large meals. Instead, eat 4 TO 6 smaller meals throughout the day.  Try to identify foods and beverages that make your symptoms worse, and avoid these.  Avoid tight clothing.  Do not exercise right after eating.

## 2014-03-06 NOTE — Op Note (Signed)
[facility] [address] [place], [id number]   ENDOSCOPY PROCEDURE REPORT  PATIENT: Lindsay Mitchell, Lindsay Mitchell  MR#: [id number] BIRTHDATE: June 05, 1941 , 61  yrs. old GENDER: Female  ENDOSCOPIST: Barney Drain, MD REFFERED RC:VELFY Gerarda Fraction, M.D.  PROCEDURE DATE:  03/06/2014 PROCEDURE:   EGD with biopsy and EGD with dilatation over guidewire   INDICATIONS:1.  dysphagia.   2.  dyspepsia. MEDICATIONS: PREOP: Promethazine (Phenergan) 12.5mg  IV, Demerol 50 mg IV, and Versed 2 mg IV TOPICAL ANESTHETIC: Viscous Xylocaine  DESCRIPTION OF PROCEDURE:   After the risks benefits and alternatives of the procedure were thoroughly explained, informed consent was obtained.  The EG-[id number] ([id number])  endoscope was introduced through the mouth and advanced to the second portion of the duodenum. The instrument was slowly withdrawn as the mucosa was carefully examined.  Prior to withdrawal of the scope, the guidwire was placed.  The esophagus was dilated successfully.  The patient was recovered in endoscopy and discharged home in satisfactory condition.   ESOPHAGUS: A mildly severe Schatzki ring was found at the gastroesophageal junction.   STOMACH: Mild non-erosive gastritis (inflammation) was found in the gastric antrum.  Multiple biopsies were performed using cold forceps.   DUODENUM: The duodenal mucosa showed no abnormalities in the bulb and second portion of the duodenum.   Dilation was then performed at the gastroesphageal junction Dilator: Savary over guidewire Size(s): 14-16 MM Resistance: minimal Heme: none  COMPLICATIONS: There were no complications.   ENDOSCOPIC IMPRESSION: 1.   Schatzki ring at the gastroesophageal junction 2.   MILD Non-erosive gastritis  RECOMMENDATIONS: AWAIT BIOPSY.  IF H PYLORI POSITIVE, TREAT WITH PYLERA. OMEPRAZOLE QAC BREAKFAST OPV IN 3 MOS.  CONSIDER BPE IF DYSPHAGIA CONTINUES.      _______________________________ Lorrin MaisBarney Drain,  MD 03/06/2014 10:19 AM

## 2014-03-06 NOTE — H&P (Signed)
Primary Care Physician:  Glo Herring., MD Primary Gastroenterologist:  Dr. Oneida Alar  Pre-Procedure History & Physical: HPI:  Lindsay Mitchell is a 73 y.o. female here for DYSPHAGIA/DYPEPSIA.  Past Medical History  Diagnosis Date  . Constipation   . Depression with anxiety   . Diverticulosis   . DVT (deep venous thrombosis)   . COPD (chronic obstructive pulmonary disease)     Emphysema radiographically  . Carotid artery occlusion   . Anxiety   . TIA (transient ischemic attack)   . Full dentures   . Pulmonary nodules 08/11/2013  . Migraine     Past Surgical History  Procedure Laterality Date  . Abdominal hysterectomy    . Back surgery      X3  . Colonoscopy  June 2002    Dr. Ferdinand Lango: severe diverticular disease with haustral hypertrophy, primary and secondary diverticulosis, persistent spasticity, early stenosis  . Colonoscopy with propofol N/A 06/21/2013    Dr. Oneida Alar: sigmoid colon and descending colon with diverticula, small internal hemorrhoids  . Tonsillectomy    . Appendectomy    . Eye surgery      both cataracts-lenses  . Nasal sinus surgery Right 07/11/2013    Procedure: RIGHT ENDOSCOPIC ANTERIOR ETHMOIDECTOMY/RIGHT ENDOSCOPIC MAXILLARY ANTROSTOMY WITH REMOVAL OF TISSUE;  Surgeon: Ascencion Dike, MD;  Location: [facility];  Service: ENT;  Laterality: Right;    Prior to Admission medications   Medication Sig Start Date End Date Taking? Authorizing Provider  albuterol (PROVENTIL HFA;VENTOLIN HFA) 108 (90 BASE) MCG/ACT inhaler Inhale 2 puffs into the lungs every 4 (four) hours as needed for wheezing or shortness of breath. 08/12/13  Yes Rexene Alberts, MD  aspirin EC 325 MG tablet Take 325 mg by mouth daily.   Yes Historical Provider, MD  gabapentin (NEURONTIN) 300 MG capsule Take 300 mg by mouth 3 (three) times daily.    Yes Historical Provider, MD  HYDROcodone-acetaminophen (NORCO) 10-325 MG per tablet Take 1 tablet by mouth every 6 (six) hours as needed  for moderate pain or severe pain.   Yes Historical Provider, MD  ipratropium-albuterol (DUONEB) 0.5-2.5 (3) MG/3ML SOLN Take 3 mLs by nebulization 3 (three) times daily. 08/12/13  Yes Rexene Alberts, MD  LINZESS 145 MCG CAPS capsule Take 145 mcg by mouth daily. 11/06/13  Yes Historical Provider, MD  polyethylene glycol powder (GLYCOLAX/MIRALAX) powder Take by mouth as needed.   Yes Historical Provider, MD  omeprazole (PRILOSEC) 20 MG capsule Take 1 capsule (20 mg total) by mouth daily. 02/20/14   Orvil Feil, NP    Allergies as of 02/20/2014 - Review Complete 02/20/2014  Allergen Reaction Noted  . Codeine Other (See Comments) 06/11/2012  . Oxycodone Itching 08/11/2013  . Talwin [pentazocine] Other (See Comments) 06/11/2012  . Valium [diazepam] Other (See Comments) 10/28/2012    Family History  Problem Relation Age of Onset  . Colon cancer Neg Hx   . Cancer Mother   . Heart disease Mother   . Hypertension Mother   . Heart attack Mother   . Other Mother     varicose veins  . Diabetes Brother   . Hypertension Brother     History   Social History  . Marital Status: Married    Spouse Name: N/A    Number of Children: 4  . Years of Education: N/A   Occupational History  . Not on file.   Social History Main Topics  . Smoking status: Current Some Day Smoker -- 1.00 packs/day for  50 years    Types: Cigarettes  . Smokeless tobacco: Not on file     Comment: smoked X 60 years  . Alcohol Use: No  . Drug Use: No  . Sexual Activity: Yes    Birth Control/ Protection: Surgical   Other Topics Concern  . Not on file   Social History Narrative  . No narrative on file    Review of Systems: See HPI, otherwise negative ROS   Physical Exam: There were no vitals taken for this visit. General:   Alert,  pleasant and cooperative in NAD Head:  Normocephalic and atraumatic. Neck:  Supple; Lungs:  Clear throughout to auscultation.    Heart:  Regular rate and rhythm. Abdomen:  Soft,  nontender and nondistended. Normal bowel sounds, without guarding, and without rebound.   Neurologic:  Alert and  oriented x4;  grossly normal neurologically.  Impression/Plan:     DYSPHAGIA/DYPEPSIA  Plan: 1. EGD/?DIL TODAY.

## 2014-03-08 ENCOUNTER — Encounter (HOSPITAL_COMMUNITY): Payer: Self-pay | Admitting: Gastroenterology

## 2014-03-15 ENCOUNTER — Telehealth: Payer: Self-pay | Admitting: Gastroenterology

## 2014-03-15 NOTE — Telephone Encounter (Signed)
Please call pt. HER stomach Bx shows gastritis.    CONTINUE PRILOSEC 30 MINUTES PRIOR TO BREAKFAST EVERY MORNING.  FOLLOW UP APPT IN 3 MONTHS E30 DYSPEPSIA/DYSPHAGIA.

## 2014-03-16 NOTE — Telephone Encounter (Signed)
Pt returned call and was informed.  

## 2014-03-16 NOTE — Telephone Encounter (Signed)
Tried to call pt. VM not set up.  

## 2014-03-29 ENCOUNTER — Other Ambulatory Visit (HOSPITAL_COMMUNITY): Payer: Self-pay | Admitting: Internal Medicine

## 2014-03-29 DIAGNOSIS — Z139 Encounter for screening, unspecified: Secondary | ICD-10-CM

## 2014-03-29 DIAGNOSIS — R928 Other abnormal and inconclusive findings on diagnostic imaging of breast: Secondary | ICD-10-CM

## 2014-03-30 NOTE — Telephone Encounter (Signed)
OV made °

## 2014-04-11 ENCOUNTER — Ambulatory Visit (HOSPITAL_COMMUNITY)
Admission: RE | Admit: 2014-04-11 | Discharge: 2014-04-11 | Disposition: A | Discharge status: Home / Self Care | Payer: Medicare Other | Source: Ambulatory Visit | Attending: Internal Medicine | Admitting: Internal Medicine

## 2014-04-11 DIAGNOSIS — R928 Other abnormal and inconclusive findings on diagnostic imaging of breast: Secondary | ICD-10-CM | POA: Diagnosis not present

## 2014-04-28 ENCOUNTER — Emergency Department (HOSPITAL_COMMUNITY)
Admission: EM | Admit: 2014-04-28 | Discharge: 2014-04-28 | Disposition: A | Discharge status: Home / Self Care | Payer: Medicare Other | Attending: Emergency Medicine | Admitting: Emergency Medicine

## 2014-04-28 ENCOUNTER — Encounter (HOSPITAL_COMMUNITY): Payer: Self-pay | Admitting: Emergency Medicine

## 2014-04-28 ENCOUNTER — Emergency Department (HOSPITAL_COMMUNITY): Payer: Medicare Other

## 2014-04-28 DIAGNOSIS — Z7982 Long term (current) use of aspirin: Secondary | ICD-10-CM | POA: Insufficient documentation

## 2014-04-28 DIAGNOSIS — Z8673 Personal history of transient ischemic attack (TIA), and cerebral infarction without residual deficits: Secondary | ICD-10-CM | POA: Insufficient documentation

## 2014-04-28 DIAGNOSIS — Z79899 Other long term (current) drug therapy: Secondary | ICD-10-CM | POA: Diagnosis not present

## 2014-04-28 DIAGNOSIS — J449 Chronic obstructive pulmonary disease, unspecified: Secondary | ICD-10-CM | POA: Insufficient documentation

## 2014-04-28 DIAGNOSIS — J4489 Other specified chronic obstructive pulmonary disease: Secondary | ICD-10-CM | POA: Insufficient documentation

## 2014-04-28 DIAGNOSIS — F341 Dysthymic disorder: Secondary | ICD-10-CM | POA: Diagnosis not present

## 2014-04-28 DIAGNOSIS — F17200 Nicotine dependence, unspecified, uncomplicated: Secondary | ICD-10-CM | POA: Diagnosis not present

## 2014-04-28 DIAGNOSIS — I10 Essential (primary) hypertension: Secondary | ICD-10-CM | POA: Insufficient documentation

## 2014-04-28 DIAGNOSIS — K5900 Constipation, unspecified: Secondary | ICD-10-CM | POA: Diagnosis not present

## 2014-04-28 DIAGNOSIS — Z86718 Personal history of other venous thrombosis and embolism: Secondary | ICD-10-CM | POA: Diagnosis not present

## 2014-04-28 DIAGNOSIS — Z98811 Dental restoration status: Secondary | ICD-10-CM | POA: Insufficient documentation

## 2014-04-28 DIAGNOSIS — G43909 Migraine, unspecified, not intractable, without status migrainosus: Secondary | ICD-10-CM | POA: Insufficient documentation

## 2014-04-28 LAB — URINALYSIS, ROUTINE W REFLEX MICROSCOPIC
Bilirubin Urine: NEGATIVE
Glucose, UA: NEGATIVE mg/dL
Ketones, ur: NEGATIVE mg/dL
Leukocytes, UA: NEGATIVE
Nitrite: NEGATIVE
Protein, ur: NEGATIVE mg/dL
Specific Gravity, Urine: 1.015 (ref 1.005–1.030)
Urobilinogen, UA: 0.2 mg/dL (ref 0.0–1.0)
pH: 5 (ref 5.0–8.0)

## 2014-04-28 LAB — CBC WITH DIFFERENTIAL/PLATELET
Basophils Absolute: 0.1 10*3/uL (ref 0.0–0.1)
Basophils Relative: 1 % (ref 0–1)
Eosinophils Absolute: 0.5 10*3/uL (ref 0.0–0.7)
Eosinophils Relative: 6 % — ABNORMAL HIGH (ref 0–5)
HCT: 43.2 % (ref 36.0–46.0)
Hemoglobin: 13.8 g/dL (ref 12.0–15.0)
Lymphocytes Relative: 36 % (ref 12–46)
Lymphs Abs: 2.7 10*3/uL (ref 0.7–4.0)
MCH: 30.9 pg (ref 26.0–34.0)
MCHC: 31.9 g/dL (ref 30.0–36.0)
MCV: 96.6 fL (ref 78.0–100.0)
Monocytes Absolute: 0.8 10*3/uL (ref 0.1–1.0)
Monocytes Relative: 11 % (ref 3–12)
Neutro Abs: 3.5 10*3/uL (ref 1.7–7.7)
Neutrophils Relative %: 46 % (ref 43–77)
Platelets: 200 10*3/uL (ref 150–400)
RBC: 4.47 MIL/uL (ref 3.87–5.11)
RDW: 12.6 % (ref 11.5–15.5)
WBC: 7.5 10*3/uL (ref 4.0–10.5)

## 2014-04-28 LAB — COMPREHENSIVE METABOLIC PANEL
ALT: 7 U/L (ref 0–35)
AST: 18 U/L (ref 0–37)
Albumin: 3.8 g/dL (ref 3.5–5.2)
Alkaline Phosphatase: 93 U/L (ref 39–117)
Anion gap: 10 (ref 5–15)
BUN: 21 mg/dL (ref 6–23)
CO2: 30 mEq/L (ref 19–32)
Calcium: 9.5 mg/dL (ref 8.4–10.5)
Chloride: 101 mEq/L (ref 96–112)
Creatinine, Ser: 0.85 mg/dL (ref 0.50–1.10)
GFR calc Af Amer: 79 mL/min — ABNORMAL LOW (ref >90)
GFR calc non Af Amer: 68 mL/min — ABNORMAL LOW (ref >90)
Glucose, Bld: 97 mg/dL (ref 70–99)
Potassium: 4.2 mEq/L (ref 3.7–5.3)
Sodium: 141 mEq/L (ref 137–147)
Total Bilirubin: 0.2 mg/dL — ABNORMAL LOW (ref 0.3–1.2)
Total Protein: 6.9 g/dL (ref 6.0–8.3)

## 2014-04-28 LAB — I-STAT TROPONIN, ED
Troponin i, poc: 0 ng/mL (ref 0.00–0.08)
Troponin i, poc: 0.01 ng/mL (ref 0.00–0.08)

## 2014-04-28 LAB — URINE MICROSCOPIC-ADD ON

## 2014-04-28 MED ORDER — CYCLOPENTOLATE-PHENYLEPHRINE OP SOLN OPTIME - NO CHARGE
OPHTHALMIC | Status: AC
  Filled 2014-04-28: qty 2

## 2014-04-28 MED ORDER — CYCLOPENTOLATE HCL 1 % OP SOLN
2.0000 [drp] | Freq: Once | OPHTHALMIC | Status: DC
  Filled 2014-04-28: qty 2

## 2014-04-28 MED ORDER — CYCLOPENTOLATE-PHENYLEPHRINE 0.2-1 % OP SOLN
1.0000 [drp] | Freq: Once | OPHTHALMIC | Status: AC
  Administered 2014-04-28: 1 [drp] via OPHTHALMIC
  Filled 2014-04-28: qty 2

## 2014-04-28 NOTE — ED Provider Notes (Addendum)
CSN: [id number]     Arrival date & time 04/28/14  0027 History   None    Chief Complaint  Patient presents with  . Hypertension     (Consider location/radiation/quality/duration/timing/severity/associated sxs/prior Treatment) HPI This is a 73 year old female who started taking her blood pressure yesterday morning because she was "feeling bad". By "feeling bad" she is vague but states she feels weak. She states her blood pressures usually run typically 98/69 but she took a lot of her blood pressures throughout the day yesterday and had readings from 150/79 through 192/114. She is having a vague, mild pain in the left upper arm, worse with palpation. She denies any change in her chronic shortness of breath. She has not been having nausea or vomiting. She has had episodes of sweating but these are chronic. She denies chest pain. She is having a mild vague ache above her left eye as well as the sensation of seeing threads with her left eye.   Past Medical History  Diagnosis Date  . Constipation   . Depression with anxiety   . Diverticulosis   . DVT (deep venous thrombosis)   . COPD (chronic obstructive pulmonary disease)     Emphysema radiographically  . Carotid artery occlusion   . Anxiety   . TIA (transient ischemic attack)   . Full dentures   . Pulmonary nodules 08/11/2013  . Migraine    Past Surgical History  Procedure Laterality Date  . Abdominal hysterectomy    . Back surgery      X3  . Colonoscopy  June 2002    Dr. Ferdinand Lango: severe diverticular disease with haustral hypertrophy, primary and secondary diverticulosis, persistent spasticity, early stenosis  . Colonoscopy with propofol N/A 06/21/2013    Dr. Oneida Alar: sigmoid colon and descending colon with diverticula, small internal hemorrhoids  . Tonsillectomy    . Appendectomy    . Eye surgery      both cataracts-lenses  . Nasal sinus surgery Right 07/11/2013    Procedure: RIGHT ENDOSCOPIC ANTERIOR ETHMOIDECTOMY/RIGHT  ENDOSCOPIC MAXILLARY ANTROSTOMY WITH REMOVAL OF TISSUE;  Surgeon: Ascencion Dike, MD;  Location: [facility];  Service: ENT;  Laterality: Right;  . Esophagogastroduodenoscopy N/A 03/06/2014    Procedure: ESOPHAGOGASTRODUODENOSCOPY (EGD);  Surgeon: Danie Binder, MD;  [facility];  Service: Endoscopy;  Laterality: N/A;  9;30  . Savory dilation N/A 03/06/2014    Procedure: SAVORY DILATION;  Surgeon: Danie Binder, MD;  [facility];  Service: Endoscopy;  Laterality: N/A;  . Esophageal biopsy  03/06/2014    Procedure: BIOPSY;  Surgeon: Danie Binder, MD;  [facility];  Service: Endoscopy;;   Family History  Problem Relation Age of Onset  . Colon cancer Neg Hx   . Cancer Mother   . Heart disease Mother   . Hypertension Mother   . Heart attack Mother   . Other Mother     varicose veins  . Diabetes Brother   . Hypertension Brother    History  Substance Use Topics  . Smoking status: Current Some Day Smoker -- 1.00 packs/day for 50 years    Types: Cigarettes  . Smokeless tobacco: Not on file     Comment: smoked X 60 years  . Alcohol Use: No   OB History   Grav Para Term Preterm Abortions TAB SAB Ect Mult Living                 Review of Systems  All  other systems reviewed and are negative.   Allergies  Codeine; Oxycodone; Talwin; and Valium  Home Medications   Prior to Admission medications   Medication Sig Start Date End Date Taking? Authorizing Provider  albuterol (PROVENTIL HFA;VENTOLIN HFA) 108 (90 BASE) MCG/ACT inhaler Inhale 2 puffs into the lungs every 4 (four) hours as needed for wheezing or shortness of breath. 08/12/13  Yes Rexene Alberts, MD  aspirin EC 325 MG tablet Take 325 mg by mouth daily.   Yes Historical Provider, MD  gabapentin (NEURONTIN) 300 MG capsule Take 300 mg by mouth 3 (three) times daily.    Yes Historical Provider, MD  HYDROcodone-acetaminophen (NORCO) 10-325 MG per tablet Take 1 tablet by mouth every 6 (six)  hours as needed for moderate pain or severe pain.   Yes Historical Provider, MD  ipratropium-albuterol (DUONEB) 0.5-2.5 (3) MG/3ML SOLN Take 3 mLs by nebulization 3 (three) times daily. 08/12/13  Yes Rexene Alberts, MD  LINZESS 145 MCG CAPS capsule Take 145 mcg by mouth daily. 11/06/13  Yes Historical Provider, MD  omeprazole (PRILOSEC) 20 MG capsule Take 1 capsule (20 mg total) by mouth daily. 02/20/14   Orvil Feil, NP  polyethylene glycol powder [facility]) powder Take by mouth as needed.    Historical Provider, MD   BP 151/74  Pulse 65  Temp(Src) 98.1 F (36.7 C) (Oral)  Resp 20  Ht 5\' 3"  (1.6 m)  Wt 114 lb (51.71 kg)  BMI 20.20 kg/m2  SpO2 94%  Physical Exam General: Well-developed, well-nourished female in no acute distress; appearance consistent with age of record HENT: normocephalic; atraumatic Eyes: pupils equal, round and reactive to light; extraocular muscles intact; lens implant; normal funduscopic exam after instillation of cyclopentolate Neck: supple Heart: regular rate and rhythm Lungs: Faint expiratory wheezing Abdomen: soft; nondistended; nontender; no masses or hepatosplenomegaly; bowel sounds present Extremities: No deformity; full range of motion; pulses normal; left upper arm soft tissue tenderness Neurologic: Awake, alert and oriented; motor function intact in all extremities and symmetric; no facial droop; normal coordination and speech Skin: Warm and dry Psychiatric: Flat affect    ED Course  Procedures (including critical care time)    EKG Interpretation   Date/Time:  Friday April 28 2014 00:49:56 EDT Ventricular Rate:  74 PR Interval:  220 QRS Duration: 81 QT Interval:  403 QTC Calculation: 447 R Axis:   86 Text Interpretation:  Sinus rhythm Prolonged PR interval Borderline right  axis deviation Anteroseptal infarct, old Rate is slower Confirmed by  Kamani Magnussen  MD, Jenny Reichmann ([id number]) on 04/28/2014 12:57:36 AM      MDM   Nursing notes and vitals  signs, including pulse oximetry, reviewed.  Summary of this visit's results, reviewed by myself:  Labs:  Results for orders placed during the hospital encounter of 04/28/14 (from the past 24 hour(s))  I-STAT TROPOININ, ED     Status: None   Collection Time    04/28/14 12:52 AM      Result Value Ref Range   Troponin i, poc 0.01  0.00 - 0.08 ng/mL   Comment 3           CBC WITH DIFFERENTIAL     Status: Abnormal   Collection Time    04/28/14 12:58 AM      Result Value Ref Range   WBC 7.5  4.0 - 10.5 K/uL   RBC 4.47  3.87 - 5.11 MIL/uL   Hemoglobin 13.8  12.0 - 15.0 g/dL   HCT 43.2  36.0 -  46.0 %   MCV 96.6  78.0 - 100.0 fL   MCH 30.9  26.0 - 34.0 pg   MCHC 31.9  30.0 - 36.0 g/dL   RDW 12.6  11.5 - 15.5 %   Platelets 200  150 - 400 K/uL   Neutrophils Relative % 46  43 - 77 %   Neutro Abs 3.5  1.7 - 7.7 K/uL   Lymphocytes Relative 36  12 - 46 %   Lymphs Abs 2.7  0.7 - 4.0 K/uL   Monocytes Relative 11  3 - 12 %   Monocytes Absolute 0.8  0.1 - 1.0 K/uL   Eosinophils Relative 6 (*) 0 - 5 %   Eosinophils Absolute 0.5  0.0 - 0.7 K/uL   Basophils Relative 1  0 - 1 %   Basophils Absolute 0.1  0.0 - 0.1 K/uL  COMPREHENSIVE METABOLIC PANEL     Status: Abnormal   Collection Time    04/28/14 12:58 AM      Result Value Ref Range   Sodium 141  137 - 147 mEq/L   Potassium 4.2  3.7 - 5.3 mEq/L   Chloride 101  96 - 112 mEq/L   CO2 30  19 - 32 mEq/L   Glucose, Bld 97  70 - 99 mg/dL   BUN 21  6 - 23 mg/dL   Creatinine, Ser 0.85  0.50 - 1.10 mg/dL   Calcium 9.5  8.4 - 10.5 mg/dL   Total Protein 6.9  6.0 - 8.3 g/dL   Albumin 3.8  3.5 - 5.2 g/dL   AST 18  0 - 37 U/L   ALT 7  0 - 35 U/L   Alkaline Phosphatase 93  39 - 117 U/L   Total Bilirubin 0.2 (*) 0.3 - 1.2 mg/dL   GFR calc non Af Amer 68 (*) >90 mL/min   GFR calc Af Amer 79 (*) >90 mL/min   Anion gap 10  5 - 15  I-STAT TROPOININ, ED     Status: None   Collection Time    04/28/14  2:36 AM      Result Value Ref Range   Troponin i,  poc 0.00  0.00 - 0.08 ng/mL   Comment 3           URINALYSIS, ROUTINE W REFLEX MICROSCOPIC     Status: Abnormal   Collection Time    04/28/14  2:50 AM      Result Value Ref Range   Color, Urine YELLOW  YELLOW   APPearance CLEAR  CLEAR   Specific Gravity, Urine 1.015  1.005 - 1.030   pH 5.0  5.0 - 8.0   Glucose, UA NEGATIVE  NEGATIVE mg/dL   Hgb urine dipstick TRACE (*) NEGATIVE   Bilirubin Urine NEGATIVE  NEGATIVE   Ketones, ur NEGATIVE  NEGATIVE mg/dL   Protein, ur NEGATIVE  NEGATIVE mg/dL   Urobilinogen, UA 0.2  0.0 - 1.0 mg/dL   Nitrite NEGATIVE  NEGATIVE   Leukocytes, UA NEGATIVE  NEGATIVE  URINE MICROSCOPIC-ADD ON     Status: Abnormal   Collection Time    04/28/14  2:50 AM      Result Value Ref Range   Squamous Epithelial / LPF MANY (*) RARE   WBC, UA 0-2  <3 WBC/hpf   RBC / HPF 0-2  <3 RBC/hpf   Bacteria, UA FEW (*) RARE    Imaging Studies: Dg Chest Portable 1 View  04/28/2014   CLINICAL DATA:  Shortness of  breath.  Hypertension.  Smoker.  COPD.  EXAM: PORTABLE CHEST - 1 VIEW  COMPARISON:  10/21/2013.  FINDINGS: Sized heart with a mild increase in size. Mild increase in prominence of the pulmonary vascularity. Clear lungs. The lungs remain hyperexpanded. Unremarkable bones.  IMPRESSION: 1. Interval mild increase in size of the heart and mild pulmonary vascular congestion. 2. Stable changes of COPD.   Electronically Signed   By: Enrique Sack M.D.   On: 04/28/2014 01:17   3:18 AM BP has improved. Patient insistent on going home, will follow up with PCP today.    Wynetta Fines, MD 04/28/14 1505  Wynetta Fines, MD 04/28/14 [id number]  Wynetta Fines, MD 04/28/14 [phone]

## 2014-04-28 NOTE — ED Notes (Signed)
Pt initially complaints of high blood pressure at home x 1 day. Pt also now reporting that her left arm has been painful.  Pt also c/o  Left eye pain above the eye and some visual changes x 1 day

## 2014-05-10 ENCOUNTER — Encounter: Payer: Self-pay | Admitting: Gastroenterology

## 2014-06-06 ENCOUNTER — Ambulatory Visit: Payer: Medicare Other | Admitting: Gastroenterology

## 2014-06-29 ENCOUNTER — Ambulatory Visit (INDEPENDENT_AMBULATORY_CARE_PROVIDER_SITE_OTHER): Payer: Medicare Other | Admitting: Otolaryngology

## 2014-06-29 DIAGNOSIS — J310 Chronic rhinitis: Secondary | ICD-10-CM

## 2014-06-29 DIAGNOSIS — J343 Hypertrophy of nasal turbinates: Secondary | ICD-10-CM

## 2014-06-30 ENCOUNTER — Other Ambulatory Visit (INDEPENDENT_AMBULATORY_CARE_PROVIDER_SITE_OTHER): Payer: Self-pay | Admitting: Otolaryngology

## 2014-06-30 DIAGNOSIS — J329 Chronic sinusitis, unspecified: Secondary | ICD-10-CM

## 2014-07-05 ENCOUNTER — Ambulatory Visit (HOSPITAL_COMMUNITY)
Admission: RE | Admit: 2014-07-05 | Discharge: 2014-07-05 | Disposition: A | Discharge status: Home / Self Care | Payer: Medicare Other | Source: Ambulatory Visit | Attending: Otolaryngology | Admitting: Otolaryngology

## 2014-07-05 DIAGNOSIS — J329 Chronic sinusitis, unspecified: Secondary | ICD-10-CM | POA: Diagnosis not present

## 2014-07-05 DIAGNOSIS — Z9889 Other specified postprocedural states: Secondary | ICD-10-CM | POA: Insufficient documentation

## 2014-07-18 ENCOUNTER — Other Ambulatory Visit: Payer: Self-pay | Admitting: Gastroenterology

## 2014-10-31 ENCOUNTER — Encounter: Payer: Self-pay | Admitting: Neurology

## 2014-10-31 ENCOUNTER — Ambulatory Visit (INDEPENDENT_AMBULATORY_CARE_PROVIDER_SITE_OTHER): Payer: Medicare Other | Admitting: Neurology

## 2014-10-31 VITALS — BP 121/72 | HR 75 | Ht 63.0 in | Wt 112.0 lb

## 2014-10-31 DIAGNOSIS — R202 Paresthesia of skin: Secondary | ICD-10-CM | POA: Diagnosis not present

## 2014-10-31 DIAGNOSIS — I498 Other specified cardiac arrhythmias: Secondary | ICD-10-CM | POA: Diagnosis not present

## 2014-10-31 DIAGNOSIS — I6340 Cerebral infarction due to embolism of unspecified cerebral artery: Secondary | ICD-10-CM | POA: Diagnosis not present

## 2014-10-31 DIAGNOSIS — I639 Cerebral infarction, unspecified: Secondary | ICD-10-CM

## 2014-10-31 DIAGNOSIS — I6522 Occlusion and stenosis of left carotid artery: Secondary | ICD-10-CM

## 2014-10-31 DIAGNOSIS — G529 Cranial nerve disorder, unspecified: Secondary | ICD-10-CM

## 2014-10-31 DIAGNOSIS — H5462 Unqualified visual loss, left eye, normal vision right eye: Secondary | ICD-10-CM

## 2014-10-31 DIAGNOSIS — R209 Unspecified disturbances of skin sensation: Secondary | ICD-10-CM | POA: Diagnosis not present

## 2014-10-31 DIAGNOSIS — G500 Trigeminal neuralgia: Secondary | ICD-10-CM | POA: Insufficient documentation

## 2014-10-31 DIAGNOSIS — H5030 Unspecified intermittent heterotropia: Secondary | ICD-10-CM

## 2014-10-31 MED ORDER — GABAPENTIN 300 MG PO CAPS: 600.0000 mg | ORAL_CAPSULE | Freq: Three times a day (TID) | ORAL | Status: DC

## 2014-10-31 MED ORDER — METHYLPREDNISOLONE 4 MG PO KIT
PACK | ORAL | Status: DC
Start: 2014-10-31 — End: 2014-11-13

## 2014-10-31 NOTE — Progress Notes (Signed)
[facility]    Provider:  Dr Jaynee Eagles Referring Provider: Ascencion Dike, MD Primary Care Physician:  Glo Herring., MD  CC:  Left facial pain  HPI:  Lindsay Mitchell is a 74 y.o. female here as a referral from Dr. Benjamine Mola for left facial pain. PMHx tobacco abuse, COPD, carotid stenosis here for left facial pain. Pain started on the right side of the face but now is worse on the left side. Starts above the left eyebrow, it never goes away, tender, it shoots into her eye deeply behind the eye like someone is pulling her eye out then travels into her gums. She can grit her teeth and it feels better. No inciting events, no trauma. There is tenderness above her eye brow and near her ear. She originally saw Dr. Benjamine Mola and he did sinus surgery on the right side which possibly helped because it eventualy went away. She went back to him when the left-sided symptoms started and he checked her sinuses and found them to be normal then referred her here. Pain above the eye brow is continuous, then it starts slowly proceding into the eyes and gums and then to her left ear(points to the tragus). More slow and painful and grinding as opposed to lightning or jabbing.  May last an hour, may last 2 minutes. 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  Mild generalized atrophy. Mild chronic microvascular ischemic changes in the white matter. No acute infarct. Brainstem and cerebellum are intact.  Negative for mass lesion. Negative for intracranial hemorrhage. Ventricle size is appropriate for the level of atrophy.  Chronic sinusitis right maxillary sinus.  IMPRESSION: Chronic microvascular ischemic changes in the white matter.  No acute abnormality.  (personally reviewed inages and agree with findings)  US carotid arteries:  Findings:  RIGHT CAROTID ARTERY: Mild heterogeneous plaque formation with areas of echogenic shadowing calcification. No hemodynamically significant right ICA stenosis, velocity elevation, or turbulent flow.  RIGHT VERTEBRAL ARTERY: Antegrade  LEFT CAROTID ARTERY: Slightly more pronounced left carotid bifurcation atherosclerosis involving the ICA origin. Plaque formation results in luminal narrowing by gray scale imaging. In the left mid ICA, there is velocity elevation measuring 251/25. Mild turbulent flow noted with focal aliasing in the mid left ICA. Based on velocity criteria, the left mid ICA narrowing is estimated at greater than 70% compatible with hemodynamic significant stenosis.  Review of Systems: Patient complains of symptoms per HPI as well as the following symptoms: eye pain, constipation, dizziness, weakness. Pertinent negatives per HPI. All others negative.   History   Social History  . Marital Status: Married    Spouse Name: Jenny Reichmann  . Number of Children: 4  . Years of Education: 11th   Occupational History  . Retired     Social History Main Topics  . Smoking status: Current Some Day Smoker -- 1.00 packs/day for 50 years    Types: Cigarettes  . Smokeless tobacco: Not on file     Comment: smoked X 60 years  . Alcohol Use: No  . Drug Use: No  . Sexual Activity: Yes    Birth Control/ Protection: Surgical   Other Topics Concern  . Not on file   Social History Narrative  Lives at home with husband Jenny Reichmann.    Caffeine use: 3-4 cups coffee/day    Family History  Problem Relation Age of Onset  . Colon cancer Neg Hx   . Breast cancer Mother   . Heart disease Mother   . Hypertension Mother   . Heart attack Mother   . Other Mother     varicose veins  . Diabetes Brother   . Hypertension Brother   . Throat cancer Father     Past Medical  History  Diagnosis Date  . Constipation   . Depression with anxiety   . Diverticulosis   . DVT (deep venous thrombosis)   . COPD (chronic obstructive pulmonary disease)     Emphysema radiographically  . Carotid artery occlusion   . Anxiety   . TIA (transient ischemic attack)   . Full dentures   . Pulmonary nodules 08/11/2013  . Migraine   . Fibromyalgia     Past Surgical History  Procedure Laterality Date  . Abdominal hysterectomy  1971  . Back surgery      X3  . Colonoscopy  June 2002    Dr. Ferdinand Lango: severe diverticular disease with haustral hypertrophy, primary and secondary diverticulosis, persistent spasticity, early stenosis  . Colonoscopy with propofol N/A 06/21/2013    Dr. Oneida Alar: sigmoid colon and descending colon with diverticula, small internal hemorrhoids  . Tonsillectomy    . Appendectomy    . Eye surgery      both cataracts-lenses  . Nasal sinus surgery Right 07/11/2013    Procedure: RIGHT ENDOSCOPIC ANTERIOR ETHMOIDECTOMY/RIGHT ENDOSCOPIC MAXILLARY ANTROSTOMY WITH REMOVAL OF TISSUE;  Surgeon: Ascencion Dike, MD;  Location: [facility];  Service: ENT;  Laterality: Right;  . Esophagogastroduodenoscopy N/A 03/06/2014    Procedure: ESOPHAGOGASTRODUODENOSCOPY (EGD);  Surgeon: Danie Binder, MD;  [facility];  Service: Endoscopy;  Laterality: N/A;  9;30  . Savory dilation N/A 03/06/2014    Procedure: SAVORY DILATION;  Surgeon: Danie Binder, MD;  [facility];  Service: Endoscopy;  Laterality: N/A;  . Esophageal biopsy  03/06/2014    Procedure: BIOPSY;  Surgeon: Danie Binder, MD;  [facility];  Service: Endoscopy;;  . Other surgical history      scar tissue removal x2    Current Outpatient Prescriptions  Medication Sig Dispense Refill  . albuterol (PROVENTIL HFA;VENTOLIN HFA) 108 (90 BASE) MCG/ACT inhaler Inhale 2 puffs into the lungs every 4 (four) hours as needed for wheezing or shortness of breath. 1 Inhaler 12  .  aspirin EC 325 MG tablet Take 325 mg by mouth daily.    Marland Kitchen gabapentin (NEURONTIN) 300 MG capsule Take 2 capsules (600 mg total) by mouth 3 (three) times daily. 180 capsule 6  . HYDROcodone-acetaminophen (NORCO) 10-325 MG per tablet Take 1 tablet by mouth every 6 (six) hours as needed for moderate pain or severe pain.    Marland Kitchen LINZESS 145 MCG CAPS capsule TAKE 1 CAPSULE BY MOUTH EVERY DAY 30 MINUTES PRIOR TO THE FIRST MEAL OF THE DAY 30 capsule 11  . polyethylene glycol powder (GLYCOLAX/MIRALAX) powder Take by mouth as needed.    Marland Kitchen ipratropium-albuterol (DUONEB) 0.5-2.5 (3) MG/3ML SOLN Take 3 mLs by nebulization 3 (three) times daily. (Patient not taking: Reported on 10/31/2014)    . methylPREDNISolone (MEDROL DOSEPAK) 4 MG tablet follow package directions 21 tablet 0  . omeprazole (PRILOSEC) 20 MG capsule Take 1 capsule (20 mg total) by mouth daily. (Patient not taking: Reported  on 10/31/2014) 30 capsule 3   No current facility-administered medications for this visit.    Allergies as of 10/31/2014 - Review Complete 10/31/2014  Allergen Reaction Noted  . Codeine Other (See Comments) 06/11/2012  . Oxycodone Itching 08/11/2013  . Talwin [pentazocine] Other (See Comments) 06/11/2012  . Valium [diazepam] Other (See Comments) 10/28/2012    Vitals: BP 121/72 mmHg  Pulse 75  Ht '5\' 3"'  (1.6 m)  Wt 112 lb (50.803 kg)  BMI 19.84 kg/m2 Last Weight:  Wt Readings from Last 1 Encounters:  10/31/14 112 lb (50.803 kg)   Last Height:   Ht Readings from Last 1 Encounters:  10/31/14 '5\' 3"'  (1.6 m)   Physical exam: Exam: Gen: NAD, conversant, well nourised, well groomed                     CV: RRR. No Carotid Bruits Eyes: Conjunctivae clear without exudates or hemorrhage  Tenderness at the supraorbital notch and trigeminal nerve anterior to the tragus  Neuro: Detailed Neurologic Exam  Speech:    Speech is normal; fluent and spontaneous with normal comprehension.  Cognition:    The patient is oriented  to person, place, and time;     recent and remote memory intact;     language fluent;     normal attention, concentration,     fund of knowledge Cranial Nerves:    The pupils are equal, round, and reactive to light. The fundi are flat. Visual fields are full to finger confrontation. Extraocular movements are intact. Trigeminal sensation is intact and the muscles of mastication are normal. The face is symmetric. The palate elevates in the midline. Hearing intact. Voice is normal. Shoulder shrug is normal. The tongue has normal motion without fasciculations.   Coordination:    Normal finger to nose and heel to shin. Normal rapid alternating movements.   Gait:    Heel-toe and tandem gait are normal.   Motor Observation:    No asymmetry, no atrophy, and no involuntary movements noted. Tone:    Normal muscle tone.    Posture:    Posture is normal. normal erect    Strength:    Strength is V/V in the upper and lower limbs.      Sensation: intact to LT     Reflex Exam:  DTR's:    Absent achilles Toes:    The toes are downgoing bilaterally.   Clonus:    Clonus is absent.       Assessment/Plan:  74 year old female PMHx tobacco abuse, COPD, carotid stenosis with pain in the left trigrminal V1, V2 and V3 distributions, left monocular vision loss.   MRI of the brain w/wo contrast trigeminal protocol Declined MRA of the head due to financial reasons Carotid dopplers studies 2D echo with bubble study ESR    Sarina Ill, MD  [facility] [address] [place], [place] [address]  Phone [phone] Fax [phone]

## 2014-10-31 NOTE — Patient Instructions (Addendum)
Overall you are doing fairly well but I do want to suggest a few things today:   Remember to drink plenty of fluid, eat healthy meals and do not skip any meals. Try to eat protein with a every meal and eat a healthy snack such as fruit or nuts in between meals. Try to keep a regular sleep-wake schedule and try to exercise daily, particularly in the form of walking, 20-30 minutes a day, if you can.   As far as your medications are concerned, I would like to suggest; steroid taper, increasing neurontin as discussed  As far as diagnostic testing: MRI of the brain w/wo contrast, labwork  I would like to see you back in 3 months, sooner if we need to. Please call us with any interim questions, concerns, problems, updates or refill requests.   Please also call us for any test results so we can go over those with you on the phone.  My clinical assistant and will answer any of your questions and relay your messages to me and also relay most of my messages to you.   Our phone number is [phone]. We also have an after hours call service for urgent matters and there is a physician on-call for urgent questions. For any emergencies you know to call 911 or go to the nearest emergency room

## 2014-11-01 ENCOUNTER — Telehealth: Payer: Self-pay | Admitting: Neurology

## 2014-11-01 DIAGNOSIS — G894 Chronic pain syndrome: Secondary | ICD-10-CM | POA: Diagnosis not present

## 2014-11-01 DIAGNOSIS — Z6820 Body mass index (BMI) 20.0-20.9, adult: Secondary | ICD-10-CM | POA: Diagnosis not present

## 2014-11-01 DIAGNOSIS — J449 Chronic obstructive pulmonary disease, unspecified: Secondary | ICD-10-CM | POA: Diagnosis not present

## 2014-11-01 LAB — COMPREHENSIVE METABOLIC PANEL
ALT: 6 IU/L (ref 0–32)
AST: 13 IU/L (ref 0–40)
Albumin/Globulin Ratio: 1.8 (ref 1.1–2.5)
Albumin: 4.1 g/dL (ref 3.5–4.8)
Alkaline Phosphatase: 91 IU/L (ref 39–117)
BUN/Creatinine Ratio: 25 (ref 11–26)
BUN: 24 mg/dL (ref 8–27)
Bilirubin Total: <0.2 mg/dL (ref 0.0–1.2)
CO2: 28 mmol/L (ref 18–29)
Calcium: 9.8 mg/dL (ref 8.7–10.3)
Chloride: 100 mmol/L (ref 97–108)
Creatinine, Ser: 0.95 mg/dL (ref 0.57–1.00)
GFR calc Af Amer: 70 mL/min/{1.73_m2} (ref >59)
GFR calc non Af Amer: 60 mL/min/{1.73_m2} (ref >59)
Globulin, Total: 2.3 g/dL (ref 1.5–4.5)
Glucose: 78 mg/dL (ref 65–99)
Potassium: 4.6 mmol/L (ref 3.5–5.2)
Sodium: 142 mmol/L (ref 134–144)
Total Protein: 6.4 g/dL (ref 6.0–8.5)

## 2014-11-01 NOTE — Telephone Encounter (Signed)
Spoke to patient, normal CMP.

## 2014-11-02 ENCOUNTER — Telehealth: Payer: Self-pay | Admitting: *Deleted

## 2014-11-02 NOTE — Telephone Encounter (Signed)
Dr. Jaynee Eagles already spoke with patient. See previous phone note.

## 2014-11-02 NOTE — Telephone Encounter (Signed)
Dr. Jaynee Eagles already spoke with pt. About lab results. See previous phone note.

## 2014-11-05 DIAGNOSIS — G500 Trigeminal neuralgia: Secondary | ICD-10-CM | POA: Insufficient documentation

## 2014-11-05 DIAGNOSIS — H5462 Unqualified visual loss, left eye, normal vision right eye: Secondary | ICD-10-CM | POA: Insufficient documentation

## 2014-11-07 ENCOUNTER — Ambulatory Visit (HOSPITAL_COMMUNITY)
Admission: RE | Admit: 2014-11-07 | Discharge: 2014-11-07 | Disposition: A | Discharge status: Home / Self Care | Payer: Medicare Other | Source: Ambulatory Visit | Attending: Neurology | Admitting: Neurology

## 2014-11-07 DIAGNOSIS — H5712 Ocular pain, left eye: Secondary | ICD-10-CM | POA: Diagnosis not present

## 2014-11-07 DIAGNOSIS — R202 Paresthesia of skin: Secondary | ICD-10-CM

## 2014-11-07 DIAGNOSIS — H5462 Unqualified visual loss, left eye, normal vision right eye: Secondary | ICD-10-CM | POA: Diagnosis not present

## 2014-11-07 DIAGNOSIS — I6522 Occlusion and stenosis of left carotid artery: Secondary | ICD-10-CM

## 2014-11-07 DIAGNOSIS — G500 Trigeminal neuralgia: Secondary | ICD-10-CM

## 2014-11-07 DIAGNOSIS — I6523 Occlusion and stenosis of bilateral carotid arteries: Secondary | ICD-10-CM | POA: Diagnosis not present

## 2014-11-07 DIAGNOSIS — I081 Rheumatic disorders of both mitral and tricuspid valves: Secondary | ICD-10-CM | POA: Diagnosis not present

## 2014-11-07 DIAGNOSIS — I509 Heart failure, unspecified: Secondary | ICD-10-CM | POA: Insufficient documentation

## 2014-11-07 DIAGNOSIS — I639 Cerebral infarction, unspecified: Secondary | ICD-10-CM

## 2014-11-07 DIAGNOSIS — I498 Other specified cardiac arrhythmias: Secondary | ICD-10-CM

## 2014-11-07 MED ORDER — GADOBENATE DIMEGLUMINE 529 MG/ML IV SOLN
10.0000 mL | Freq: Once | INTRAVENOUS | Status: AC | PRN
  Administered 2014-11-07: 10 mL via INTRAVENOUS

## 2014-11-07 NOTE — Progress Notes (Signed)
  Echocardiogram 2D Echocardiogram has been performed.  Samuel Germany 11/07/2014, 3:35 PM

## 2014-11-09 ENCOUNTER — Other Ambulatory Visit: Payer: Self-pay | Admitting: Neurology

## 2014-11-09 ENCOUNTER — Telehealth: Payer: Self-pay | Admitting: Neurology

## 2014-11-09 DIAGNOSIS — I6522 Occlusion and stenosis of left carotid artery: Secondary | ICD-10-CM

## 2014-11-09 DIAGNOSIS — G500 Trigeminal neuralgia: Secondary | ICD-10-CM

## 2014-11-09 MED ORDER — GABAPENTIN 300 MG PO CAPS: 900.0000 mg | ORAL_CAPSULE | Freq: Three times a day (TID) | ORAL | Status: AC

## 2014-11-09 NOTE — Telephone Encounter (Signed)
Spoke to patient. MRI of the brain and 2D echo unremarkable. 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 Patient has seen vascular surgeon Dr. Adele Barthel in the past at [facility], will refer back to him for symptomatic carotid stenosis management

## 2014-11-13 ENCOUNTER — Other Ambulatory Visit: Payer: Self-pay | Admitting: Neurology

## 2014-11-13 DIAGNOSIS — G500 Trigeminal neuralgia: Secondary | ICD-10-CM

## 2014-11-13 DIAGNOSIS — R202 Paresthesia of skin: Secondary | ICD-10-CM

## 2014-11-13 MED ORDER — METHYLPREDNISOLONE 4 MG PO KIT: PACK | ORAL | Status: DC

## 2014-11-13 NOTE — Telephone Encounter (Signed)
Patient would like to know if she could get another prescription for Prednisone. Please call the and advise.

## 2014-11-13 NOTE — Telephone Encounter (Signed)
Spoke to patient. Offered her Trileptal and Tegretol for trigeminal neuralgia, she prefers to try a medrol dosepak. She will call me back.

## 2014-11-22 ENCOUNTER — Encounter: Payer: Self-pay | Admitting: Vascular Surgery

## 2014-11-23 ENCOUNTER — Ambulatory Visit (INDEPENDENT_AMBULATORY_CARE_PROVIDER_SITE_OTHER): Payer: Medicare Other | Admitting: Vascular Surgery

## 2014-11-23 ENCOUNTER — Encounter: Payer: Self-pay | Admitting: Vascular Surgery

## 2014-11-23 VITALS — BP 116/62 | HR 75 | Ht 63.0 in | Wt 113.6 lb

## 2014-11-23 DIAGNOSIS — I6523 Occlusion and stenosis of bilateral carotid arteries: Secondary | ICD-10-CM | POA: Diagnosis not present

## 2014-11-23 DIAGNOSIS — G500 Trigeminal neuralgia: Secondary | ICD-10-CM

## 2014-11-23 DIAGNOSIS — H5462 Unqualified visual loss, left eye, normal vision right eye: Secondary | ICD-10-CM | POA: Diagnosis not present

## 2014-11-23 NOTE — Addendum Note (Signed)
Addended by: Mena Goes on: 11/23/2014 04:51 PM   Modules accepted: Orders

## 2014-11-23 NOTE — Progress Notes (Signed)
Established Carotid Patient  History of Present Illness  Lindsay Mitchell is a 74 y.o. (1941/02/04) female who presents with chief complaint: left eye visual field loss.  Previous carotid studies demonstrated: RICA <53% stenosis, LICA 66-44% stenosis.  Pt previously has had an episode of expressive aphasia with amnesia.  When I evaluated her at 2014, I was not convinced that she had a sx LICA stenosis, especially with <70% stenosis.  In the last 8-9 months, she has had development loss of visual field in Left eye which has improved with some residual field loss.  She also notes intermittent blurred vision and severe L side facial pain that radiates into the necek.  The patient has never had facial drooping or hemiplegia.  The patient is referred by Neuro to evaluate the L ICA stenosis again  The patient's PMH, PSH, SH, FamHx, Med, and Allergies are unchanged from 12/10/12.  On ROS today: persistent defect in L eye vision, no expressive aphasia  Physical Examination  Filed Vitals:   11/23/14 1359 11/23/14 1402  BP: 126/74 116/62  Pulse: 78 75  Height: 5\' 3"  (1.6 m)   Weight: 113 lb 9.6 oz (51.529 kg)   SpO2: 94%    Body mass index is 20.13 kg/(m^2).  General: A&O x 3, WD, thin  Eyes: PERRLA, EOMI, B arcus senilus  Neck: Supple, no nuchal rigidity, no palpable LAD  Pulmonary: Sym exp, good air movt, CTAB, no rales, rhonchi, & wheezing  Cardiac: RRR, Nl S1, S2, no Murmurs, rubs or gallops  Vascular: Vessel Right Left  Radial Palpable Palpable  Brachial Palpable Palpable  Carotid Palpable, without bruit Palpable, without bruit  Aorta Not palpable N/A  Femoral Palpable Palpable  Popliteal Not palpable Not palpable  PT Palpable Palpable  DP Palpable Palpable   Gastrointestinal: soft, NTND, -G/R, - HSM, - masses, - CVAT B  Musculoskeletal: M/S 5/5 throughout , Extremities without ischemic changes , mild TTP L face and neck  Neurologic: CN 2-12 intact , Pain and light touch  intact in extremities , Motor exam as listed above   Non-Invasive Vascular Imaging  Outside CAROTID DUPLEX (Date: 11/07/14):   R ICA stenosis: <50%  R VA: patent and antegrade  L ICA stenosis: 50-69%  L VA: patent and antegrade  Medical Decision Making  Lindsay Mitchell is a 74 y.o. female who presents with: asx R ICA stenosis <50%, ? Sx L ICA stenosis 50-69%   Based on the patient's vascular studies and examination, I have offered the patient: Opth c/s to evaluate retina and to get formal visual field testing.  If Opth also suspect embolization, a L carotid angiogram is likely indicated.    Given the 1% stroke risk associated with such, I reluctant to proceed without additional information from [place].  The patient will follow up with me in another 2-4 weeks.  I discussed in depth with the patient the nature of atherosclerosis, and emphasized the importance of maximal medical management including strict control of blood pressure, blood glucose, and lipid levels, antiplatelet agents, obtaining regular exercise, and cessation of smoking.    The patient is aware that without maximal medical management the underlying atherosclerotic disease process will progress, limiting the benefit of any interventions. The patient is currently not on a statin: as not medically indicated. The patient is currently on an anti-platelet: ASA.  Thank you for allowing [place] to participate in this patient's care.  Adele Barthel, MD [facility] Office: [phone]  Pager: [phone]  11/23/2014, 2:35 PM

## 2014-11-27 NOTE — Telephone Encounter (Signed)
Dr office requesting notes from previous visit with Dr Jaynee Eagles on 10/31/14.

## 2014-11-28 ENCOUNTER — Other Ambulatory Visit: Payer: Self-pay | Admitting: Neurology

## 2014-11-28 ENCOUNTER — Telehealth: Payer: Self-pay | Admitting: *Deleted

## 2014-11-28 DIAGNOSIS — G500 Trigeminal neuralgia: Secondary | ICD-10-CM

## 2014-11-28 MED ORDER — CARBAMAZEPINE ER 200 MG PO TB12: 200.0000 mg | ORAL_TABLET | Freq: Two times a day (BID) | ORAL | Status: DC

## 2014-11-28 MED ORDER — OXCARBAZEPINE 300 MG PO TABS: 300.0000 mg | ORAL_TABLET | Freq: Two times a day (BID) | ORAL | Status: DC

## 2014-11-28 NOTE — Telephone Encounter (Signed)
Talked with pt and she said she has been having "unbarable" pain the last three days in her left eye. Said her pain was a "5/10" on the pain scale and has reached a 10/10 in the past three days. She was wondering if Dr. Jaynee Eagles could fit her in today or tomorrow for a possible nerve block that they previously talked about.

## 2014-11-28 NOTE — Telephone Encounter (Signed)
Called and spoke with Dr. Deeann Saint office and they confirmed they received fax for pt's recent office visit notes.

## 2014-11-28 NOTE — Telephone Encounter (Signed)
I sent office visit notes through [facility] to Leta Baptist, referring doctor.

## 2014-11-28 NOTE — Telephone Encounter (Signed)
Spoke to patient. She is still having supraorbital paim. Suggested nerve blocks but her insurance may not pay and she can't afford them out of pocket. We increased the neurontin at last appointment as this can be helpful in nerve pain but it did not help. Will try Trileptal, will start 200mg  bid. Advised that the combination of this with neurontin may increase somnolence but she takes neurontin for neuropathy and prefers not to stop. Advised stopping hydrocodone (not prescribed by me, prescribed by another provider) but patient says she has been on this for years and can't stop. Discussed serious and common side effects of tegretol inparticular TEN, pancytopenia and advised to stop for any concerning event. Will not prescribe tegretol, has more cardiac risks and  side effects. Patient acknowledges.

## 2014-12-12 ENCOUNTER — Telehealth: Payer: Self-pay | Admitting: Neurology

## 2014-12-12 NOTE — Telephone Encounter (Addendum)
Patient is calling as she has lost 9 lbs within 1 1/2 weeks and is constantly throwing up. She has pain in the lower part of her stomach and lower part of knee. Patient discontinued oxcarbazepine 300 mg 2 times per day after 3 days as it did not help pain in face. Please call.

## 2014-12-12 NOTE — Telephone Encounter (Signed)
She stopped the Tegretol 10 days ago. Over the last 10 days she is vomiting, not eating, lost weight, abdominal pain which is constant around the naval. Highly encouraged her to go to the ED, this could be very serious.

## 2014-12-13 ENCOUNTER — Ambulatory Visit (HOSPITAL_COMMUNITY)
Admission: RE | Admit: 2014-12-13 | Discharge: 2014-12-13 | Disposition: A | Discharge status: Home / Self Care | Payer: Medicare Other | Source: Ambulatory Visit | Attending: Internal Medicine | Admitting: Internal Medicine

## 2014-12-13 ENCOUNTER — Encounter (HOSPITAL_COMMUNITY): Payer: Self-pay

## 2014-12-13 ENCOUNTER — Other Ambulatory Visit (HOSPITAL_COMMUNITY): Payer: Self-pay | Admitting: Internal Medicine

## 2014-12-13 DIAGNOSIS — F17210 Nicotine dependence, cigarettes, uncomplicated: Secondary | ICD-10-CM | POA: Diagnosis not present

## 2014-12-13 DIAGNOSIS — K5900 Constipation, unspecified: Secondary | ICD-10-CM | POA: Diagnosis present

## 2014-12-13 DIAGNOSIS — D649 Anemia, unspecified: Secondary | ICD-10-CM | POA: Diagnosis not present

## 2014-12-13 DIAGNOSIS — N281 Cyst of kidney, acquired: Secondary | ICD-10-CM | POA: Diagnosis not present

## 2014-12-13 DIAGNOSIS — A047 Enterocolitis due to Clostridium difficile: Secondary | ICD-10-CM | POA: Diagnosis not present

## 2014-12-13 DIAGNOSIS — I214 Non-ST elevation (NSTEMI) myocardial infarction: Secondary | ICD-10-CM | POA: Diagnosis not present

## 2014-12-13 DIAGNOSIS — E876 Hypokalemia: Secondary | ICD-10-CM | POA: Diagnosis not present

## 2014-12-13 DIAGNOSIS — Z8673 Personal history of transient ischemic attack (TIA), and cerebral infarction without residual deficits: Secondary | ICD-10-CM | POA: Diagnosis not present

## 2014-12-13 DIAGNOSIS — F329 Major depressive disorder, single episode, unspecified: Secondary | ICD-10-CM | POA: Diagnosis not present

## 2014-12-13 DIAGNOSIS — I10 Essential (primary) hypertension: Secondary | ICD-10-CM | POA: Diagnosis not present

## 2014-12-13 DIAGNOSIS — K5720 Diverticulitis of large intestine with perforation and abscess without bleeding: Secondary | ICD-10-CM | POA: Diagnosis not present

## 2014-12-13 DIAGNOSIS — M797 Fibromyalgia: Secondary | ICD-10-CM | POA: Diagnosis not present

## 2014-12-13 DIAGNOSIS — R579 Shock, unspecified: Secondary | ICD-10-CM | POA: Diagnosis not present

## 2014-12-13 DIAGNOSIS — R112 Nausea with vomiting, unspecified: Secondary | ICD-10-CM | POA: Diagnosis not present

## 2014-12-13 DIAGNOSIS — J9621 Acute and chronic respiratory failure with hypoxia: Secondary | ICD-10-CM | POA: Diagnosis not present

## 2014-12-13 DIAGNOSIS — K5730 Diverticulosis of large intestine without perforation or abscess without bleeding: Secondary | ICD-10-CM

## 2014-12-13 DIAGNOSIS — Z86718 Personal history of other venous thrombosis and embolism: Secondary | ICD-10-CM | POA: Diagnosis not present

## 2014-12-13 DIAGNOSIS — I2584 Coronary atherosclerosis due to calcified coronary lesion: Secondary | ICD-10-CM | POA: Diagnosis present

## 2014-12-13 DIAGNOSIS — Z7982 Long term (current) use of aspirin: Secondary | ICD-10-CM | POA: Diagnosis not present

## 2014-12-13 DIAGNOSIS — G894 Chronic pain syndrome: Secondary | ICD-10-CM | POA: Diagnosis present

## 2014-12-13 DIAGNOSIS — Z79899 Other long term (current) drug therapy: Secondary | ICD-10-CM

## 2014-12-13 DIAGNOSIS — R109 Unspecified abdominal pain: Secondary | ICD-10-CM | POA: Diagnosis not present

## 2014-12-13 DIAGNOSIS — Z681 Body mass index (BMI) 19 or less, adult: Secondary | ICD-10-CM

## 2014-12-13 DIAGNOSIS — K6389 Other specified diseases of intestine: Secondary | ICD-10-CM | POA: Diagnosis not present

## 2014-12-13 DIAGNOSIS — F419 Anxiety disorder, unspecified: Secondary | ICD-10-CM | POA: Diagnosis not present

## 2014-12-13 DIAGNOSIS — R918 Other nonspecific abnormal finding of lung field: Secondary | ICD-10-CM | POA: Diagnosis present

## 2014-12-13 DIAGNOSIS — E43 Unspecified severe protein-calorie malnutrition: Secondary | ICD-10-CM | POA: Diagnosis not present

## 2014-12-13 DIAGNOSIS — N179 Acute kidney failure, unspecified: Secondary | ICD-10-CM | POA: Diagnosis not present

## 2014-12-13 DIAGNOSIS — E8770 Fluid overload, unspecified: Secondary | ICD-10-CM | POA: Diagnosis not present

## 2014-12-13 DIAGNOSIS — R1031 Right lower quadrant pain: Secondary | ICD-10-CM | POA: Diagnosis not present

## 2014-12-13 DIAGNOSIS — J90 Pleural effusion, not elsewhere classified: Secondary | ICD-10-CM | POA: Diagnosis not present

## 2014-12-13 DIAGNOSIS — I2510 Atherosclerotic heart disease of native coronary artery without angina pectoris: Secondary | ICD-10-CM | POA: Diagnosis present

## 2014-12-13 DIAGNOSIS — Z7952 Long term (current) use of systemic steroids: Secondary | ICD-10-CM

## 2014-12-13 DIAGNOSIS — J441 Chronic obstructive pulmonary disease with (acute) exacerbation: Secondary | ICD-10-CM | POA: Diagnosis not present

## 2014-12-13 DIAGNOSIS — R1013 Epigastric pain: Secondary | ICD-10-CM | POA: Diagnosis not present

## 2014-12-13 DIAGNOSIS — K219 Gastro-esophageal reflux disease without esophagitis: Secondary | ICD-10-CM | POA: Diagnosis not present

## 2014-12-13 LAB — POCT I-STAT CREATININE: Creatinine, Ser: 1.1 mg/dL (ref 0.50–1.10)

## 2014-12-13 MED ORDER — IOHEXOL 300 MG/ML  SOLN
100.0000 mL | Freq: Once | INTRAMUSCULAR | Status: AC | PRN
  Administered 2014-12-13: 100 mL via INTRAVENOUS

## 2014-12-14 ENCOUNTER — Ambulatory Visit (HOSPITAL_COMMUNITY): Payer: Medicare Other

## 2014-12-15 ENCOUNTER — Inpatient Hospital Stay (HOSPITAL_COMMUNITY)
Admission: EM | Admit: 2014-12-15 | Discharge: 2014-12-28 | DRG: 391 | Disposition: A | Discharge status: Home Health | Payer: Medicare Other | Attending: Internal Medicine | Admitting: Internal Medicine

## 2014-12-15 ENCOUNTER — Encounter (HOSPITAL_COMMUNITY): Payer: Self-pay | Admitting: *Deleted

## 2014-12-15 DIAGNOSIS — K5900 Constipation, unspecified: Secondary | ICD-10-CM | POA: Diagnosis present

## 2014-12-15 DIAGNOSIS — J90 Pleural effusion, not elsewhere classified: Secondary | ICD-10-CM | POA: Diagnosis not present

## 2014-12-15 DIAGNOSIS — K5720 Diverticulitis of large intestine with perforation and abscess without bleeding: Secondary | ICD-10-CM | POA: Diagnosis not present

## 2014-12-15 DIAGNOSIS — F418 Other specified anxiety disorders: Secondary | ICD-10-CM | POA: Diagnosis present

## 2014-12-15 DIAGNOSIS — Z86718 Personal history of other venous thrombosis and embolism: Secondary | ICD-10-CM | POA: Diagnosis not present

## 2014-12-15 DIAGNOSIS — K5792 Diverticulitis of intestine, part unspecified, without perforation or abscess without bleeding: Secondary | ICD-10-CM | POA: Diagnosis not present

## 2014-12-15 DIAGNOSIS — R079 Chest pain, unspecified: Secondary | ICD-10-CM | POA: Diagnosis not present

## 2014-12-15 DIAGNOSIS — Z8673 Personal history of transient ischemic attack (TIA), and cerebral infarction without residual deficits: Secondary | ICD-10-CM | POA: Diagnosis not present

## 2014-12-15 DIAGNOSIS — J449 Chronic obstructive pulmonary disease, unspecified: Secondary | ICD-10-CM | POA: Diagnosis present

## 2014-12-15 DIAGNOSIS — R0789 Other chest pain: Secondary | ICD-10-CM | POA: Diagnosis present

## 2014-12-15 DIAGNOSIS — J441 Chronic obstructive pulmonary disease with (acute) exacerbation: Secondary | ICD-10-CM | POA: Diagnosis not present

## 2014-12-15 DIAGNOSIS — F419 Anxiety disorder, unspecified: Secondary | ICD-10-CM | POA: Diagnosis present

## 2014-12-15 DIAGNOSIS — R0902 Hypoxemia: Secondary | ICD-10-CM

## 2014-12-15 DIAGNOSIS — R112 Nausea with vomiting, unspecified: Secondary | ICD-10-CM | POA: Diagnosis present

## 2014-12-15 DIAGNOSIS — K5732 Diverticulitis of large intestine without perforation or abscess without bleeding: Secondary | ICD-10-CM | POA: Diagnosis not present

## 2014-12-15 DIAGNOSIS — A047 Enterocolitis due to Clostridium difficile: Secondary | ICD-10-CM | POA: Diagnosis not present

## 2014-12-15 DIAGNOSIS — R778 Other specified abnormalities of plasma proteins: Secondary | ICD-10-CM | POA: Diagnosis present

## 2014-12-15 DIAGNOSIS — J9811 Atelectasis: Secondary | ICD-10-CM | POA: Diagnosis not present

## 2014-12-15 DIAGNOSIS — Z79899 Other long term (current) drug therapy: Secondary | ICD-10-CM | POA: Diagnosis not present

## 2014-12-15 DIAGNOSIS — N170 Acute kidney failure with tubular necrosis: Secondary | ICD-10-CM | POA: Diagnosis not present

## 2014-12-15 DIAGNOSIS — I25110 Atherosclerotic heart disease of native coronary artery with unstable angina pectoris: Secondary | ICD-10-CM | POA: Diagnosis not present

## 2014-12-15 DIAGNOSIS — E43 Unspecified severe protein-calorie malnutrition: Secondary | ICD-10-CM | POA: Diagnosis not present

## 2014-12-15 DIAGNOSIS — J189 Pneumonia, unspecified organism: Secondary | ICD-10-CM

## 2014-12-15 DIAGNOSIS — R0602 Shortness of breath: Secondary | ICD-10-CM | POA: Diagnosis not present

## 2014-12-15 DIAGNOSIS — D649 Anemia, unspecified: Secondary | ICD-10-CM | POA: Diagnosis not present

## 2014-12-15 DIAGNOSIS — I2510 Atherosclerotic heart disease of native coronary artery without angina pectoris: Secondary | ICD-10-CM | POA: Diagnosis present

## 2014-12-15 DIAGNOSIS — I959 Hypotension, unspecified: Secondary | ICD-10-CM | POA: Diagnosis present

## 2014-12-15 DIAGNOSIS — R579 Shock, unspecified: Secondary | ICD-10-CM | POA: Diagnosis not present

## 2014-12-15 DIAGNOSIS — F329 Major depressive disorder, single episode, unspecified: Secondary | ICD-10-CM | POA: Diagnosis present

## 2014-12-15 DIAGNOSIS — R7989 Other specified abnormal findings of blood chemistry: Secondary | ICD-10-CM | POA: Diagnosis not present

## 2014-12-15 DIAGNOSIS — R0600 Dyspnea, unspecified: Secondary | ICD-10-CM

## 2014-12-15 DIAGNOSIS — K5730 Diverticulosis of large intestine without perforation or abscess without bleeding: Secondary | ICD-10-CM | POA: Diagnosis not present

## 2014-12-15 DIAGNOSIS — R0781 Pleurodynia: Secondary | ICD-10-CM

## 2014-12-15 DIAGNOSIS — Z681 Body mass index (BMI) 19 or less, adult: Secondary | ICD-10-CM | POA: Diagnosis not present

## 2014-12-15 DIAGNOSIS — G894 Chronic pain syndrome: Secondary | ICD-10-CM | POA: Diagnosis present

## 2014-12-15 DIAGNOSIS — I214 Non-ST elevation (NSTEMI) myocardial infarction: Secondary | ICD-10-CM | POA: Diagnosis not present

## 2014-12-15 DIAGNOSIS — N179 Acute kidney failure, unspecified: Secondary | ICD-10-CM | POA: Diagnosis not present

## 2014-12-15 DIAGNOSIS — Z7952 Long term (current) use of systemic steroids: Secondary | ICD-10-CM | POA: Diagnosis not present

## 2014-12-15 DIAGNOSIS — I10 Essential (primary) hypertension: Secondary | ICD-10-CM | POA: Diagnosis present

## 2014-12-15 DIAGNOSIS — M797 Fibromyalgia: Secondary | ICD-10-CM | POA: Diagnosis present

## 2014-12-15 DIAGNOSIS — A0472 Enterocolitis due to Clostridium difficile, not specified as recurrent: Secondary | ICD-10-CM | POA: Diagnosis present

## 2014-12-15 DIAGNOSIS — E8770 Fluid overload, unspecified: Secondary | ICD-10-CM | POA: Diagnosis not present

## 2014-12-15 DIAGNOSIS — R918 Other nonspecific abnormal finding of lung field: Secondary | ICD-10-CM | POA: Diagnosis present

## 2014-12-15 DIAGNOSIS — K651 Peritoneal abscess: Secondary | ICD-10-CM | POA: Diagnosis not present

## 2014-12-15 DIAGNOSIS — R197 Diarrhea, unspecified: Secondary | ICD-10-CM | POA: Diagnosis not present

## 2014-12-15 DIAGNOSIS — F17210 Nicotine dependence, cigarettes, uncomplicated: Secondary | ICD-10-CM | POA: Diagnosis present

## 2014-12-15 DIAGNOSIS — Z720 Tobacco use: Secondary | ICD-10-CM | POA: Diagnosis not present

## 2014-12-15 DIAGNOSIS — K5780 Diverticulitis of intestine, part unspecified, with perforation and abscess without bleeding: Secondary | ICD-10-CM | POA: Diagnosis not present

## 2014-12-15 DIAGNOSIS — I2584 Coronary atherosclerosis due to calcified coronary lesion: Secondary | ICD-10-CM | POA: Diagnosis present

## 2014-12-15 DIAGNOSIS — J439 Emphysema, unspecified: Secondary | ICD-10-CM | POA: Diagnosis not present

## 2014-12-15 DIAGNOSIS — J432 Centrilobular emphysema: Secondary | ICD-10-CM | POA: Diagnosis not present

## 2014-12-15 DIAGNOSIS — R05 Cough: Secondary | ICD-10-CM | POA: Diagnosis not present

## 2014-12-15 DIAGNOSIS — Z7982 Long term (current) use of aspirin: Secondary | ICD-10-CM | POA: Diagnosis not present

## 2014-12-15 DIAGNOSIS — J9621 Acute and chronic respiratory failure with hypoxia: Secondary | ICD-10-CM | POA: Diagnosis not present

## 2014-12-15 DIAGNOSIS — E876 Hypokalemia: Secondary | ICD-10-CM | POA: Diagnosis not present

## 2014-12-15 HISTORY — DX: Disorder of arteries and arterioles, unspecified: I77.9

## 2014-12-15 HISTORY — DX: Atrioventricular block, first degree: I44.0

## 2014-12-15 HISTORY — DX: Unspecified protein-calorie malnutrition: E46

## 2014-12-15 HISTORY — DX: Peripheral vascular disease, unspecified: I73.9

## 2014-12-15 LAB — CBC WITH DIFFERENTIAL/PLATELET
Basophils Absolute: 0.1 10*3/uL (ref 0.0–0.1)
Basophils Relative: 1 % (ref 0–1)
Eosinophils Absolute: 0.1 10*3/uL (ref 0.0–0.7)
Eosinophils Relative: 1 % (ref 0–5)
HCT: 41.9 % (ref 36.0–46.0)
Hemoglobin: 13.2 g/dL (ref 12.0–15.0)
Lymphocytes Relative: 24 % (ref 12–46)
Lymphs Abs: 1.9 10*3/uL (ref 0.7–4.0)
MCH: 30.5 pg (ref 26.0–34.0)
MCHC: 31.5 g/dL (ref 30.0–36.0)
MCV: 96.8 fL (ref 78.0–100.0)
Monocytes Absolute: 0.8 10*3/uL (ref 0.1–1.0)
Monocytes Relative: 11 % (ref 3–12)
Neutro Abs: 5.1 10*3/uL (ref 1.7–7.7)
Neutrophils Relative %: 63 % (ref 43–77)
Platelets: 240 10*3/uL (ref 150–400)
RBC: 4.33 MIL/uL (ref 3.87–5.11)
RDW: 12.7 % (ref 11.5–15.5)
WBC: 8 10*3/uL (ref 4.0–10.5)

## 2014-12-15 LAB — COMPREHENSIVE METABOLIC PANEL
ALT: 7 U/L (ref 0–35)
AST: 17 U/L (ref 0–37)
Albumin: 3.2 g/dL — ABNORMAL LOW (ref 3.5–5.2)
Alkaline Phosphatase: 78 U/L (ref 39–117)
Anion gap: 9 (ref 5–15)
BUN: 19 mg/dL (ref 6–23)
CO2: 30 mmol/L (ref 19–32)
Calcium: 9 mg/dL (ref 8.4–10.5)
Chloride: 101 mmol/L (ref 96–112)
Creatinine, Ser: 0.84 mg/dL (ref 0.50–1.10)
GFR calc Af Amer: 79 mL/min — ABNORMAL LOW (ref >90)
GFR calc non Af Amer: 68 mL/min — ABNORMAL LOW (ref >90)
Glucose, Bld: 95 mg/dL (ref 70–99)
Potassium: 4 mmol/L (ref 3.5–5.1)
Sodium: 140 mmol/L (ref 135–145)
Total Bilirubin: 0.5 mg/dL (ref 0.3–1.2)
Total Protein: 6.5 g/dL (ref 6.0–8.3)

## 2014-12-15 LAB — LIPASE, BLOOD: Lipase: 31 U/L (ref 11–59)

## 2014-12-15 MED ORDER — ONDANSETRON HCL 4 MG/2ML IJ SOLN
4.0000 mg | Freq: Once | INTRAMUSCULAR | Status: AC
  Administered 2014-12-15: 4 mg via INTRAVENOUS
  Filled 2014-12-15: qty 2

## 2014-12-15 MED ORDER — ALBUTEROL SULFATE (2.5 MG/3ML) 0.083% IN NEBU
3.0000 mL | INHALATION_SOLUTION | RESPIRATORY_TRACT | Status: DC | PRN
  Administered 2014-12-18: 3 mL via RESPIRATORY_TRACT
  Filled 2014-12-15: qty 3

## 2014-12-15 MED ORDER — ACETAMINOPHEN 650 MG RE SUPP: 650.0000 mg | Freq: Four times a day (QID) | RECTAL | Status: DC | PRN

## 2014-12-15 MED ORDER — HYDROMORPHONE HCL 1 MG/ML IJ SOLN
0.5000 mg | INTRAMUSCULAR | Status: DC | PRN
  Administered 2014-12-15 – 2014-12-16 (×3): 0.5 mg via INTRAVENOUS
  Filled 2014-12-15 (×3): qty 1

## 2014-12-15 MED ORDER — BOOST / RESOURCE BREEZE PO LIQD
1.0000 | Freq: Three times a day (TID) | ORAL | Status: DC
  Administered 2014-12-16 – 2014-12-19 (×5): 1 via ORAL

## 2014-12-15 MED ORDER — ACETAMINOPHEN 325 MG PO TABS: 650.0000 mg | ORAL_TABLET | Freq: Four times a day (QID) | ORAL | Status: DC | PRN

## 2014-12-15 MED ORDER — SODIUM CHLORIDE 0.9 % IV SOLN
INTRAVENOUS | Status: DC
  Administered 2014-12-15 – 2014-12-19 (×6): via INTRAVENOUS

## 2014-12-15 MED ORDER — CIPROFLOXACIN IN D5W 400 MG/200ML IV SOLN
400.0000 mg | Freq: Once | INTRAVENOUS | Status: AC
  Administered 2014-12-15: 400 mg via INTRAVENOUS
  Filled 2014-12-15: qty 200

## 2014-12-15 MED ORDER — LINACLOTIDE 145 MCG PO CAPS
145.0000 ug | ORAL_CAPSULE | Freq: Every day | ORAL | Status: DC
  Filled 2014-12-15 (×8): qty 1

## 2014-12-15 MED ORDER — CIPROFLOXACIN IN D5W 400 MG/200ML IV SOLN
400.0000 mg | Freq: Two times a day (BID) | INTRAVENOUS | Status: DC
  Administered 2014-12-16 – 2014-12-19 (×7): 400 mg via INTRAVENOUS
  Filled 2014-12-15 (×8): qty 200

## 2014-12-15 MED ORDER — IPRATROPIUM-ALBUTEROL 0.5-2.5 (3) MG/3ML IN SOLN
3.0000 mL | Freq: Three times a day (TID) | RESPIRATORY_TRACT | Status: DC
Start: 2014-12-15 — End: 2014-12-18
  Administered 2014-12-15 – 2014-12-18 (×8): 3 mL via RESPIRATORY_TRACT
  Filled 2014-12-15 (×8): qty 3

## 2014-12-15 MED ORDER — ENOXAPARIN SODIUM 40 MG/0.4ML ~~LOC~~ SOLN
40.0000 mg | SUBCUTANEOUS | Status: DC
  Administered 2014-12-15 – 2014-12-18 (×4): 40 mg via SUBCUTANEOUS
  Filled 2014-12-15 (×4): qty 0.4

## 2014-12-15 MED ORDER — ALUM & MAG HYDROXIDE-SIMETH 200-200-20 MG/5ML PO SUSP
30.0000 mL | Freq: Four times a day (QID) | ORAL | Status: DC | PRN
  Administered 2014-12-18 (×2): 30 mL via ORAL
  Filled 2014-12-15 (×2): qty 30

## 2014-12-15 MED ORDER — ASPIRIN EC 325 MG PO TBEC
325.0000 mg | DELAYED_RELEASE_TABLET | Freq: Every day | ORAL | Status: DC
  Administered 2014-12-16 – 2014-12-19 (×4): 325 mg via ORAL
  Filled 2014-12-15 (×4): qty 1

## 2014-12-15 MED ORDER — HYDROMORPHONE HCL 1 MG/ML IJ SOLN
1.0000 mg | Freq: Once | INTRAMUSCULAR | Status: AC
  Administered 2014-12-15: 1 mg via INTRAVENOUS
  Filled 2014-12-15: qty 1

## 2014-12-15 MED ORDER — METRONIDAZOLE IN NACL 5-0.79 MG/ML-% IV SOLN
500.0000 mg | Freq: Three times a day (TID) | INTRAVENOUS | Status: DC
  Administered 2014-12-16 – 2014-12-19 (×10): 500 mg via INTRAVENOUS
  Filled 2014-12-15 (×10): qty 100

## 2014-12-15 MED ORDER — METRONIDAZOLE IN NACL 5-0.79 MG/ML-% IV SOLN
500.0000 mg | Freq: Once | INTRAVENOUS | Status: AC
  Administered 2014-12-15: 500 mg via INTRAVENOUS
  Filled 2014-12-15: qty 100

## 2014-12-15 MED ORDER — ONDANSETRON HCL 4 MG/2ML IJ SOLN
4.0000 mg | Freq: Four times a day (QID) | INTRAMUSCULAR | Status: DC | PRN
  Administered 2014-12-15 – 2014-12-19 (×5): 4 mg via INTRAVENOUS
  Filled 2014-12-15 (×5): qty 2

## 2014-12-15 MED ORDER — ONDANSETRON HCL 4 MG PO TABS: 4.0000 mg | ORAL_TABLET | Freq: Four times a day (QID) | ORAL | Status: DC | PRN

## 2014-12-15 MED ORDER — SODIUM CHLORIDE 0.9 % IV BOLUS (SEPSIS)
500.0000 mL | Freq: Once | INTRAVENOUS | Status: AC
  Administered 2014-12-15: 500 mL via INTRAVENOUS

## 2014-12-15 MED ORDER — HYDROCODONE-ACETAMINOPHEN 10-325 MG PO TABS
1.0000 | ORAL_TABLET | Freq: Four times a day (QID) | ORAL | Status: DC | PRN
  Administered 2014-12-15 – 2014-12-20 (×7): 1 via ORAL
  Filled 2014-12-15 (×8): qty 1

## 2014-12-15 MED ORDER — GABAPENTIN 300 MG PO CAPS
900.0000 mg | ORAL_CAPSULE | Freq: Three times a day (TID) | ORAL | Status: DC
  Administered 2014-12-15 – 2014-12-28 (×38): 900 mg via ORAL
  Filled 2014-12-15 (×42): qty 3

## 2014-12-15 NOTE — ED Provider Notes (Signed)
CSN: [id number]     Arrival date & time 12/15/14  1201 History   First MD Initiated Contact with Patient 12/15/14 1443     Chief Complaint  Patient presents with  . Diverticulitis     (Consider location/radiation/quality/duration/timing/severity/associated sxs/prior Treatment) The history is provided by the patient.   complaining of abdominal pain for 2 weeks. Predominantly left lower quadrant. 2 days ago started on antibiotics for diverticulitis. Patient had CT scan that showed diverticulosis with a 2 cm abscess off the sigmoid area felt to be related to a abscess from diverticulitis. Patient unable to keep any of the antibiotics down. Was started on Cipro and Flagyl orally. Patient also states a 12 pound weight loss over the last 2 weeks, is not able to eat. Patient states that the pain is 8 out of 10 mostly left lower quadrant but does spread throughout the abdomen.  Past Medical History  Diagnosis Date  . Constipation   . Depression with anxiety   . Diverticulosis   . DVT (deep venous thrombosis)   . COPD (chronic obstructive pulmonary disease)     Emphysema radiographically  . Carotid artery occlusion   . Anxiety   . TIA (transient ischemic attack)   . Full dentures   . Pulmonary nodules 08/11/2013  . Migraine   . Fibromyalgia   . Stroke    Past Surgical History  Procedure Laterality Date  . Abdominal hysterectomy  1971  . Back surgery      X3  . Colonoscopy  June 2002    Dr. Ferdinand Lango: severe diverticular disease with haustral hypertrophy, primary and secondary diverticulosis, persistent spasticity, early stenosis  . Colonoscopy with propofol N/A 06/21/2013    Dr. Oneida Alar: sigmoid colon and descending colon with diverticula, small internal hemorrhoids  . Tonsillectomy    . Appendectomy    . Eye surgery      both cataracts-lenses  . Nasal sinus surgery Right 07/11/2013    Procedure: RIGHT ENDOSCOPIC ANTERIOR ETHMOIDECTOMY/RIGHT ENDOSCOPIC MAXILLARY ANTROSTOMY WITH REMOVAL  OF TISSUE;  Surgeon: Ascencion Dike, MD;  Location: [facility];  Service: ENT;  Laterality: Right;  . Esophagogastroduodenoscopy N/A 03/06/2014    Procedure: ESOPHAGOGASTRODUODENOSCOPY (EGD);  Surgeon: Danie Binder, MD;  [facility];  Service: Endoscopy;  Laterality: N/A;  9;30  . Savory dilation N/A 03/06/2014    Procedure: SAVORY DILATION;  Surgeon: Danie Binder, MD;  [facility];  Service: Endoscopy;  Laterality: N/A;  . Esophageal biopsy  03/06/2014    Procedure: BIOPSY;  Surgeon: Danie Binder, MD;  Location[facility];  Service: Endoscopy;;  . Other surgical history      scar tissue removal x2   Family History  Problem Relation Age of Onset  . Colon cancer Neg Hx   . Breast cancer Mother   . Heart disease Mother   . Hypertension Mother   . Heart attack Mother   . Other Mother     varicose veins  . Cancer Mother   . Varicose Veins Mother   . Diabetes Brother   . Hypertension Brother   . Heart disease Brother   . Throat cancer Father   . Cancer Father    History  Substance Use Topics  . Smoking status: Current Some Day Smoker -- 1.00 packs/day for 50 years    Types: Cigarettes  . Smokeless tobacco: Not on file     Comment: smoked X 60 years  . Alcohol Use: No  OB History    No data available     Review of Systems  Constitutional: Positive for appetite change and unexpected weight change. Negative for fever.  HENT: Negative for congestion.   Eyes: Negative for visual disturbance.  Respiratory: Negative for shortness of breath.   Cardiovascular: Negative for chest pain.  Gastrointestinal: Positive for nausea, vomiting and abdominal pain. Negative for diarrhea.  Genitourinary: Negative for dysuria.  Musculoskeletal: Positive for myalgias. Negative for back pain.  Skin: Negative for rash.  Neurological: Negative for headaches.  Hematological: Does not bruise/bleed easily.  Psychiatric/Behavioral: Negative for confusion.       Allergies  Codeine; Oxycodone; Talwin; and Valium  Home Medications   Prior to Admission medications   Medication Sig Start Date End Date Taking? Authorizing Provider  albuterol (PROVENTIL HFA;VENTOLIN HFA) 108 (90 BASE) MCG/ACT inhaler Inhale 2 puffs into the lungs every 4 (four) hours as needed for wheezing or shortness of breath. 08/12/13  Yes Rexene Alberts, MD  aspirin EC 325 MG tablet Take 325 mg by mouth daily.   Yes Historical Provider, MD  ciprofloxacin (CIPRO) 500 MG tablet Take 500 mg by mouth 2 (two) times daily. 10 day course starting 12/13/2014 12/13/14  Yes Historical Provider, MD  gabapentin (NEURONTIN) 300 MG capsule Take 3 capsules (900 mg total) by mouth 3 (three) times daily. 11/09/14  Yes Melvenia Beam, MD  HYDROcodone-acetaminophen (NORCO) 10-325 MG per tablet Take 1 tablet by mouth every 6 (six) hours as needed for moderate pain or severe pain.   Yes Historical Provider, MD  ipratropium-albuterol (DUONEB) 0.5-2.5 (3) MG/3ML SOLN Take 3 mLs by nebulization 3 (three) times daily. 08/12/13  Yes Rexene Alberts, MD  LINZESS 145 MCG CAPS capsule TAKE 1 CAPSULE BY MOUTH EVERY DAY 30 MINUTES PRIOR TO THE FIRST MEAL OF THE DAY Patient taking differently: TAKE 1 CAPSULE BY MOUTH EVERY DAY 30 MINUTES PRIOR TO THE FIRST MEAL OF THE DAY AS NEEDED FOR CONSTIPATION 07/18/14  Yes Mahala Menghini, PA-C  metroNIDAZOLE (FLAGYL) 500 MG tablet Take 500 mg by mouth 2 (two) times daily. 10 day course starting on 12/13/2014 12/13/14  Yes Historical Provider, MD  methylPREDNISolone (MEDROL DOSEPAK) 4 MG tablet follow package directions Patient not taking: Reported on 11/23/2014 11/13/14   Melvenia Beam, MD  Oxcarbazepine (TRILEPTAL) 300 MG tablet Take 1 tablet (300 mg total) by mouth 2 (two) times daily. Patient not taking: Reported on 12/15/2014 11/28/14   Melvenia Beam, MD   BP 118/70 mmHg  Pulse 82  Temp(Src) 97.8 F (36.6 C) (Oral)  Resp 18  Ht 5\' 3"  (1.6 m)  Wt 105 lb (47.628 kg)   BMI 18.60 kg/m2  SpO2 100% Physical Exam  Constitutional: She is oriented to person, place, and time. She appears well-developed and well-nourished. No distress.  HENT:  Head: Normocephalic and atraumatic.  Eyes: Conjunctivae and EOM are normal. Pupils are equal, round, and reactive to light.  Neck: Normal range of motion.  Cardiovascular: Normal rate, regular rhythm and normal heart sounds.   Pulmonary/Chest: Effort normal and breath sounds normal.  Abdominal: Soft. Bowel sounds are normal. There is tenderness. There is guarding.  Tender left lower quadrant diffuse tenderness of the places but with guarding in left lower quadrant area.  Musculoskeletal: Normal range of motion. She exhibits no edema.  Neurological: She is alert and oriented to person, place, and time. No cranial nerve deficit. She exhibits normal muscle tone. Coordination normal.  Skin: Skin is warm. No rash noted.  Nursing note and vitals reviewed.   ED Course  Procedures (including critical care time) Labs Review Labs Reviewed  COMPREHENSIVE METABOLIC PANEL - Abnormal; Notable for the following:    Albumin 3.2 (*)    GFR calc non Af Amer 68 (*)    GFR calc Af Amer 79 (*)    All other components within normal limits  LIPASE, BLOOD  CBC WITH DIFFERENTIAL/PLATELET  URINALYSIS, ROUTINE W REFLEX MICROSCOPIC   Results for orders placed or performed during the hospital encounter of 12/15/14  Comprehensive metabolic panel  Result Value Ref Range   Sodium 140 135 - 145 mmol/L   Potassium 4.0 3.5 - 5.1 mmol/L   Chloride 101 96 - 112 mmol/L   CO2 30 19 - 32 mmol/L   Glucose, Bld 95 70 - 99 mg/dL   BUN 19 6 - 23 mg/dL   Creatinine, Ser 0.84 0.50 - 1.10 mg/dL   Calcium 9.0 8.4 - 10.5 mg/dL   Total Protein 6.5 6.0 - 8.3 g/dL   Albumin 3.2 (L) 3.5 - 5.2 g/dL   AST 17 0 - 37 U/L   ALT 7 0 - 35 U/L   Alkaline Phosphatase 78 39 - 117 U/L   Total Bilirubin 0.5 0.3 - 1.2 mg/dL   GFR calc non Af Amer 68 (L) >90 mL/min    GFR calc Af Amer 79 (L) >90 mL/min   Anion gap 9 5 - 15  Lipase, blood  Result Value Ref Range   Lipase 31 11 - 59 U/L  CBC with Differential/Platelet  Result Value Ref Range   WBC 8.0 4.0 - 10.5 K/uL   RBC 4.33 3.87 - 5.11 MIL/uL   Hemoglobin 13.2 12.0 - 15.0 g/dL   HCT 41.9 36.0 - 46.0 %   MCV 96.8 78.0 - 100.0 fL   MCH 30.5 26.0 - 34.0 pg   MCHC 31.5 30.0 - 36.0 g/dL   RDW 12.7 11.5 - 15.5 %   Platelets 240 150 - 400 K/uL   Neutrophils Relative % 63 43 - 77 %   Neutro Abs 5.1 1.7 - 7.7 K/uL   Lymphocytes Relative 24 12 - 46 %   Lymphs Abs 1.9 0.7 - 4.0 K/uL   Monocytes Relative 11 3 - 12 %   Monocytes Absolute 0.8 0.1 - 1.0 K/uL   Eosinophils Relative 1 0 - 5 %   Eosinophils Absolute 0.1 0.0 - 0.7 K/uL   Basophils Relative 1 0 - 1 %   Basophils Absolute 0.1 0.0 - 0.1 K/uL     Imaging Review No results found.   EKG Interpretation None      MDM   Final diagnoses:  Diverticulitis of large intestine with abscess without bleeding    Patient with CT scan done 2 days ago. Scan shows concerns for sigmoid abscess felt to possibly be related to diverticulitis. Skin itself did not show any evidence of diverticulitis just diverticulosis. Patient clinically with left lower quadrant abdominal pain for a couple weeks. Patient's been on antibiotics for 2 days but unable to keep the pills down. For the most part his vomited each of the antibiotics every time she's taken. Was started on Cipro and Flagyl. Given IV doses here. Patient's vital signs without any acute abnormalities. No leukocytosis. Discussed with hospitalist they will admit the IV antibiotics and follow-up of the abscess.    Fredia Sorrow, MD 12/15/14 [phone]

## 2014-12-15 NOTE — ED Notes (Signed)
Pt sent from Dr. Gerarda Fraction for Diverticulitis, unable to manage through current efforts, pt sent to ED for evaluation and treatment. Pt has lost 12 lbs in 1.5 weeks.

## 2014-12-15 NOTE — ED Notes (Signed)
Pt states that she is here to get tx for her diverticulitis that was found on an outpatient ct a few days ago.  Pt states that she does not want any re evaluation done but just tx as advised by her physician.  Pt just states she has been having abd pain for a few weeks now and that she has lost a significant amt of weight.

## 2014-12-15 NOTE — ED Notes (Signed)
Pt states that she does not like the way that the Dilaudid has made her feel.  Pt states she just feels like her head is full.  Will continue to monitor.  Husband at bedside.

## 2014-12-15 NOTE — H&P (Signed)
History and Physical  Lindsay Mitchell AGT:[id number] DOB: 1940/10/13 DOA: 12/15/2014  Referring physician: Dr Rogene Houston, ED physician PCP: Glo Herring., MD   Chief Complaint: Abdominal pain with nausea and vomiting  HPI: Lindsay Mitchell is a 74 y.o. female  With a history of diverticulosis, DVT, COPD, chronic constipation, fibromyalgia, history of a stroke. Patient seen for 2 weeks of left lower quadrant abdominal pain that has been gradually increasing. Patient began having nausea and vomiting 2 days ago and she saw her primary care physician who diagnosed her with diverticulitis and started her on oral ciprofloxacin and Flagyl. The patient was unable to tolerate any of her medication and presented to the hospital ER for worsening abdominal pain. The pain radiates to her right lower quadrant. Moving increases her pain. Rest improves her pain.   Review of Systems:   Pt denies any fevers, chills, diarrhea, constipation, rectal bleeding, chest pain, shortness of breath, palpitations.  Review of systems are otherwise negative  Past Medical History  Diagnosis Date  . Constipation   . Depression with anxiety   . Diverticulosis   . DVT (deep venous thrombosis)   . COPD (chronic obstructive pulmonary disease)     Emphysema radiographically  . Carotid artery occlusion   . Anxiety   . TIA (transient ischemic attack)   . Full dentures   . Pulmonary nodules 08/11/2013  . Migraine   . Fibromyalgia   . Stroke    Past Surgical History  Procedure Laterality Date  . Abdominal hysterectomy  1971  . Back surgery      X3  . Colonoscopy  June 2002    Dr. Ferdinand Lango: severe diverticular disease with haustral hypertrophy, primary and secondary diverticulosis, persistent spasticity, early stenosis  . Colonoscopy with propofol N/A 06/21/2013    Dr. Oneida Alar: sigmoid colon and descending colon with diverticula, small internal hemorrhoids  . Tonsillectomy    . Appendectomy    . Eye surgery     both cataracts-lenses  . Nasal sinus surgery Right 07/11/2013    Procedure: RIGHT ENDOSCOPIC ANTERIOR ETHMOIDECTOMY/RIGHT ENDOSCOPIC MAXILLARY ANTROSTOMY WITH REMOVAL OF TISSUE;  Surgeon: Ascencion Dike, MD;  Location: [place];  Service: ENT;  Laterality: Right;  . Esophagogastroduodenoscopy N/A 03/06/2014    Procedure: ESOPHAGOGASTRODUODENOSCOPY (EGD);  Surgeon: Danie Binder, MD;  [facility];  Service: Endoscopy;  Laterality: N/A;  9;30  . Savory dilation N/A 03/06/2014    Procedure: SAVORY DILATION;  Surgeon: Danie Binder, MD;  [facility];  Service: Endoscopy;  Laterality: N/A;  . Esophageal biopsy  03/06/2014    Procedure: BIOPSY;  Surgeon: Danie Binder, MD;  [facility];  Service: Endoscopy;;  . Other surgical history      scar tissue removal x2   Social History:  reports that she has been smoking Cigarettes.  She has a 50 pack-year smoking history. She does not have any smokeless tobacco history on file. She reports that she does not drink alcohol or use illicit drugs. Patient lives at home & is able to participate in activities of daily living without assistance  Allergies  Allergen Reactions  . Codeine Other (See Comments)    Makes patient not in right state of mind.   . Oxycodone Itching  . Talwin [Pentazocine] Other (See Comments)    Makes patient not in right state of mind.   . Valium [Diazepam] Other (See Comments)    Just doesn't work.     Family  History  Problem Relation Age of Onset  . Colon cancer Neg Hx   . Breast cancer Mother   . Heart disease Mother   . Hypertension Mother   . Heart attack Mother   . Other Mother     varicose veins  . Cancer Mother   . Varicose Veins Mother   . Diabetes Brother   . Hypertension Brother   . Heart disease Brother   . Throat cancer Father   . Cancer Father      Prior to Admission medications   Medication Sig Start Date End Date Taking? Authorizing Provider  albuterol  (PROVENTIL HFA;VENTOLIN HFA) 108 (90 BASE) MCG/ACT inhaler Inhale 2 puffs into the lungs every 4 (four) hours as needed for wheezing or shortness of breath. 08/12/13  Yes Rexene Alberts, MD  aspirin EC 325 MG tablet Take 325 mg by mouth daily.   Yes Historical Provider, MD  ciprofloxacin (CIPRO) 500 MG tablet Take 500 mg by mouth 2 (two) times daily. 10 day course starting 12/13/2014 12/13/14  Yes Historical Provider, MD  gabapentin (NEURONTIN) 300 MG capsule Take 3 capsules (900 mg total) by mouth 3 (three) times daily. 11/09/14  Yes Melvenia Beam, MD  HYDROcodone-acetaminophen (NORCO) 10-325 MG per tablet Take 1 tablet by mouth every 6 (six) hours as needed for moderate pain or severe pain.   Yes Historical Provider, MD  ipratropium-albuterol (DUONEB) 0.5-2.5 (3) MG/3ML SOLN Take 3 mLs by nebulization 3 (three) times daily. 08/12/13  Yes Rexene Alberts, MD  LINZESS 145 MCG CAPS capsule TAKE 1 CAPSULE BY MOUTH EVERY DAY 30 MINUTES PRIOR TO THE FIRST MEAL OF THE DAY Patient taking differently: TAKE 1 CAPSULE BY MOUTH EVERY DAY 30 MINUTES PRIOR TO THE FIRST MEAL OF THE DAY AS NEEDED FOR CONSTIPATION 07/18/14  Yes Mahala Menghini, PA-C  metroNIDAZOLE (FLAGYL) 500 MG tablet Take 500 mg by mouth 2 (two) times daily. 10 day course starting on 12/13/2014 12/13/14  Yes Historical Provider, MD  methylPREDNISolone (MEDROL DOSEPAK) 4 MG tablet follow package directions Patient not taking: Reported on 11/23/2014 11/13/14   Melvenia Beam, MD  Oxcarbazepine (TRILEPTAL) 300 MG tablet Take 1 tablet (300 mg total) by mouth 2 (two) times daily. Patient not taking: Reported on 12/15/2014 11/28/14   Melvenia Beam, MD    Physical Exam: BP 118/70 mmHg  Pulse 82  Temp(Src) 97.8 F (36.6 C) (Oral)  Resp 18  Ht 5\' 3"  (1.6 m)  Wt 47.628 kg (105 lb)  BMI 18.60 kg/m2  SpO2 100%  General: Older Caucasian female. Awake and alert and oriented x3. No acute cardiopulmonary distress.  Eyes: Pupils equal, round, reactive to  light. Extraocular muscles are intact. Sclerae anicteric and noninjected.  ENT: Moist mucosal membranes. No mucosal lesions.   Neck: Neck supple without lymphadenopathy. No carotid bruits. No masses palpated.  Cardiovascular: Regular rate with normal S1-S2 sounds. No murmurs, rubs, gallops auscultated. No JVD.  Respiratory: Good respiratory effort with no wheezes, rales, rhonchi. Lungs clear to auscultation bilaterally.  Abdomen: Tenderness diffusely but worse in the left lower quadrant. There is moderate guarding and rebound tenderness.  Hyperactive bowel sounds. No masses or hepatosplenomegaly  Skin: Dry, warm to touch. 2+ dorsalis pedis and radial pulses. Musculoskeletal: No calf or leg pain. All major joints not erythematous nontender.  Psychiatric: Intact judgment and insight.  Neurologic: No focal neurological deficits. Cranial nerves II through XII are grossly intact.           Labs on  Admission:  Basic Metabolic Panel:  Recent Labs Lab 12/13/14 1204 12/15/14 1514  NA  --  140  K  --  4.0  CL  --  101  CO2  --  30  GLUCOSE  --  95  BUN  --  19  CREATININE 1.10 0.84  CALCIUM  --  9.0   Liver Function Tests:  Recent Labs Lab 12/15/14 1514  AST 17  ALT 7  ALKPHOS 78  BILITOT 0.5  PROT 6.5  ALBUMIN 3.2*    Recent Labs Lab 12/15/14 1514  LIPASE 31   No results for input(s): AMMONIA in the last 168 hours. CBC:  Recent Labs Lab 12/15/14 1514  WBC 8.0  NEUTROABS 5.1  HGB 13.2  HCT 41.9  MCV 96.8  PLT 240   Cardiac Enzymes: No results for input(s): CKTOTAL, CKMB, CKMBINDEX, TROPONINI in the last 168 hours.  BNP (last 3 results) No results for input(s): BNP in the last 8760 hours.  ProBNP (last 3 results) No results for input(s): PROBNP in the last 8760 hours.  CBG: No results for input(s): GLUCAP in the last 168 hours.  Radiological Exams on Admission: No results found.   Assessment/Plan Present on Admission:  . Colonic diverticular  abscess  Admit to MedSurg Continue IV antibiotics: Ciprofloxacin and Flagyl IV fluids Clear liquids Surgery consult Home medications Dilaudid 0.5 mg every 2 hours when necessary pain - will be cautious not to mask acute worsening, which may be signs of rupture  DVT prophylaxis: Due to the patient's history of DVT and stroke will start the patient on Lovenox.  Consultants: Gen. surgery consult in the morning  Code Status: Full code  Family Communication: None   Disposition Plan: Pending  Time spent: 50 minutes was spent with face-to-face time with patient with at least 50% with counseling and coordination of care  Truett Mainland, DO [facility] Pager [phone]

## 2014-12-16 DIAGNOSIS — K5720 Diverticulitis of large intestine with perforation and abscess without bleeding: Principal | ICD-10-CM

## 2014-12-16 LAB — BASIC METABOLIC PANEL
Anion gap: 5 (ref 5–15)
BUN: 12 mg/dL (ref 6–23)
CO2: 31 mmol/L (ref 19–32)
Calcium: 8.3 mg/dL — ABNORMAL LOW (ref 8.4–10.5)
Chloride: 103 mmol/L (ref 96–112)
Creatinine, Ser: 0.79 mg/dL (ref 0.50–1.10)
GFR calc Af Amer: >90 mL/min (ref >90)
GFR calc non Af Amer: 82 mL/min — ABNORMAL LOW (ref >90)
Glucose, Bld: 125 mg/dL — ABNORMAL HIGH (ref 70–99)
Potassium: 3.6 mmol/L (ref 3.5–5.1)
Sodium: 139 mmol/L (ref 135–145)

## 2014-12-16 LAB — CBC
HCT: 35.3 % — ABNORMAL LOW (ref 36.0–46.0)
Hemoglobin: 11.2 g/dL — ABNORMAL LOW (ref 12.0–15.0)
MCH: 31.4 pg (ref 26.0–34.0)
MCHC: 31.7 g/dL (ref 30.0–36.0)
MCV: 98.9 fL (ref 78.0–100.0)
Platelets: 189 10*3/uL (ref 150–400)
RBC: 3.57 MIL/uL — ABNORMAL LOW (ref 3.87–5.11)
RDW: 12.8 % (ref 11.5–15.5)
WBC: 6 10*3/uL (ref 4.0–10.5)

## 2014-12-16 MED ORDER — FENTANYL CITRATE (PF) 100 MCG/2ML IJ SOLN
50.0000 ug | Freq: Four times a day (QID) | INTRAMUSCULAR | Status: DC | PRN
  Administered 2014-12-16 – 2014-12-18 (×3): 50 ug via INTRAVENOUS
  Filled 2014-12-16 (×3): qty 2

## 2014-12-16 NOTE — Progress Notes (Signed)
[facility] PROGRESS NOTE  Lindsay Mitchell YPP:[id number] DOB: [birth year]    PCP:   Glo Herring., MD   HPI: Lindsay Mitchell is an 74 y.o. female with hx of DVT, CVA, COPD, active smoker, fibromyalgia, hx of diverticulosis, admitted by Dr Nehemiah Settle for diverticulitis.  She was started on IV Flagyl and IV Cipro, and was placed on NPO.  Surgery had seen her, recommended current therapy.  Her last colonoscopy was one year ago, no colon cancer.   Rewiew of Systems:  Constitutional: Negative for malaise, fever and chills. No significant weight loss or weight gain Eyes: Negative for eye pain, redness and discharge, diplopia, visual changes, or flashes of light. ENMT: Negative for ear pain, hoarseness, nasal congestion, sinus pressure and sore throat. No headaches; tinnitus, drooling, or problem swallowing. Cardiovascular: Negative for chest pain, palpitations, diaphoresis, dyspnea and peripheral edema. ; No orthopnea, PND Respiratory: Negative for cough, hemoptysis, wheezing and stridor. No pleuritic chestpain. Gastrointestinal: Negative for nausea, vomiting, diarrhea, constipation, abdominal pain, melena, blood in stool, hematemesis, jaundice and rectal bleeding.    Genitourinary: Negative for frequency, dysuria, incontinence,flank pain and hematuria; Musculoskeletal: Negative for back pain and neck pain. Negative for swelling and trauma.;  Skin: . Negative for pruritus, rash, abrasions, bruising and skin lesion.; ulcerations Neuro: Negative for headache, lightheadedness and neck stiffness. Negative for weakness, altered level of consciousness , altered mental status, extremity weakness, burning feet, involuntary movement, seizure and syncope.  Psych: negative for anxiety, depression, insomnia, tearfulness, panic attacks, hallucinations, paranoia, suicidal or homicidal ideation    Past Medical History  Diagnosis Date  . Constipation   . Depression with anxiety   . Diverticulosis   .  DVT (deep venous thrombosis)   . COPD (chronic obstructive pulmonary disease)     Emphysema radiographically  . Carotid artery occlusion   . Anxiety   . TIA (transient ischemic attack)   . Full dentures   . Pulmonary nodules 08/11/2013  . Migraine   . Fibromyalgia   . Stroke     Past Surgical History  Procedure Laterality Date  . Abdominal hysterectomy  1971  . Back surgery      X3  . Colonoscopy  June 2002    Dr. Ferdinand Lango: severe diverticular disease with haustral hypertrophy, primary and secondary diverticulosis, persistent spasticity, early stenosis  . Colonoscopy with propofol N/A 06/21/2013    Dr. Oneida Alar: sigmoid colon and descending colon with diverticula, small internal hemorrhoids  . Tonsillectomy    . Appendectomy    . Eye surgery      both cataracts-lenses  . Nasal sinus surgery Right 07/11/2013    Procedure: RIGHT ENDOSCOPIC ANTERIOR ETHMOIDECTOMY/RIGHT ENDOSCOPIC MAXILLARY ANTROSTOMY WITH REMOVAL OF TISSUE;  Surgeon: Ascencion Dike, MD;  Location: [place];  Service: ENT;  Laterality: Right;  . Esophagogastroduodenoscopy N/A 03/06/2014    Procedure: ESOPHAGOGASTRODUODENOSCOPY (EGD);  Surgeon: Danie Binder, MD;  [facility];  Service: Endoscopy;  Laterality: N/A;  9;30  . Savory dilation N/A 03/06/2014    Procedure: SAVORY DILATION;  Surgeon: Danie Binder, MD;  [facility];  Service: Endoscopy;  Laterality: N/A;  . Esophageal biopsy  03/06/2014    Procedure: BIOPSY;  Surgeon: Danie Binder, MD;  [facility];  Service: Endoscopy;;  . Other surgical history      scar tissue removal x2    Medications:  HOME MEDS: Prior to Admission medications   Medication Sig Start Date End Date Taking? Authorizing  Provider  albuterol (PROVENTIL HFA;VENTOLIN HFA) 108 (90 BASE) MCG/ACT inhaler Inhale 2 puffs into the lungs every 4 (four) hours as needed for wheezing or shortness of breath. 08/12/13  Yes Rexene Alberts, MD  aspirin EC 325  MG tablet Take 325 mg by mouth daily.   Yes Historical Provider, MD  ciprofloxacin (CIPRO) 500 MG tablet Take 500 mg by mouth 2 (two) times daily. 10 day course starting 12/13/2014 12/13/14  Yes Historical Provider, MD  gabapentin (NEURONTIN) 300 MG capsule Take 3 capsules (900 mg total) by mouth 3 (three) times daily. 11/09/14  Yes Melvenia Beam, MD  HYDROcodone-acetaminophen (NORCO) 10-325 MG per tablet Take 1 tablet by mouth every 6 (six) hours as needed for moderate pain or severe pain.   Yes Historical Provider, MD  ipratropium-albuterol (DUONEB) 0.5-2.5 (3) MG/3ML SOLN Take 3 mLs by nebulization 3 (three) times daily. 08/12/13  Yes Rexene Alberts, MD  LINZESS 145 MCG CAPS capsule TAKE 1 CAPSULE BY MOUTH EVERY DAY 30 MINUTES PRIOR TO THE FIRST MEAL OF THE DAY Patient taking differently: TAKE 1 CAPSULE BY MOUTH EVERY DAY 30 MINUTES PRIOR TO THE FIRST MEAL OF THE DAY AS NEEDED FOR CONSTIPATION 07/18/14  Yes Mahala Menghini, PA-C  metroNIDAZOLE (FLAGYL) 500 MG tablet Take 500 mg by mouth 2 (two) times daily. 10 day course starting on 12/13/2014 12/13/14  Yes Historical Provider, MD  methylPREDNISolone (MEDROL DOSEPAK) 4 MG tablet follow package directions Patient not taking: Reported on 11/23/2014 11/13/14   Melvenia Beam, MD  Oxcarbazepine (TRILEPTAL) 300 MG tablet Take 1 tablet (300 mg total) by mouth 2 (two) times daily. Patient not taking: Reported on 12/15/2014 11/28/14   Melvenia Beam, MD     Allergies:  Allergies  Allergen Reactions  . Codeine Other (See Comments)    Makes patient not in right state of mind.   . Oxycodone Itching  . Talwin [Pentazocine] Other (See Comments)    Makes patient not in right state of mind.   . Valium [Diazepam] Other (See Comments)    Just doesn't work.     Social History:   reports that she has been smoking Cigarettes.  She has a 50 pack-year smoking history. She does not have any smokeless tobacco history on file. She reports that she does not drink  alcohol or use illicit drugs.  Family History: Family History  Problem Relation Age of Onset  . Colon cancer Neg Hx   . Breast cancer Mother   . Heart disease Mother   . Hypertension Mother   . Heart attack Mother   . Other Mother     varicose veins  . Cancer Mother   . Varicose Veins Mother   . Diabetes Brother   . Hypertension Brother   . Heart disease Brother   . Throat cancer Father   . Cancer Father      Physical Exam: Filed Vitals:   12/16/14 0109 12/16/14 0115 12/16/14 0604 12/16/14 0825  BP: 108/55  98/55   Pulse: 83  63   Temp:   98.3 F (36.8 C)   TempSrc:   Oral   Resp:   14   Height:      Weight:      SpO2: 83% 96% 94% 77%   Blood pressure 98/55, pulse 63, temperature 98.3 F (36.8 C), temperature source Oral, resp. rate 14, height 5\' 3"  (1.6 m), weight 49.442 kg (109 lb), SpO2 77 %.  GEN:  Pleasant  patient lying in the  stretcher in no acute distress; cooperative with exam. PSYCH:  alert and oriented x4; does not appear anxious or depressed; affect is appropriate. HEENT: Mucous membranes pink and anicteric; PERRLA; EOM intact; no cervical lymphadenopathy nor thyromegaly or carotid bruit; no JVD; There were no stridor. Neck is very supple. Breasts:: Not examined CHEST WALL: No tenderness CHEST: Normal respiration, clear to auscultation bilaterally.  HEART: Regular rate and rhythm.  There are no murmur, rub, or gallops.   BACK: No kyphosis or scoliosis; no CVA tenderness ABDOMEN: soft and tender on LLQ and RLQ,  no masses, no organomegaly, normal abdominal bowel sounds; no pannus; no intertriginous candida. There is no rebound and no distention. Rectal Exam: Not done EXTREMITIES: No bone or joint deformity; age-appropriate arthropathy of the hands and knees; no edema; no ulcerations.  There is no calf tenderness. Genitalia: not examined PULSES: 2+ and symmetric SKIN: Normal hydration no rash or ulceration CNS: Cranial nerves 2-12 grossly intact no focal  lateralizing neurologic deficit.  Speech is fluent; uvula elevated with phonation, facial symmetry and tongue midline. DTR are normal bilaterally, cerebella exam is intact, barbinski is negative and strengths are equaled bilaterally.  No sensory loss.   Labs on Admission:  Basic Metabolic Panel:  Recent Labs Lab 12/13/14 1204 12/15/14 1514 12/16/14 0605  NA  --  140 139  K  --  4.0 3.6  CL  --  101 103  CO2  --  30 31  GLUCOSE  --  95 125*  BUN  --  19 12  CREATININE 1.10 0.84 0.79  CALCIUM  --  9.0 8.3*   Liver Function Tests:  Recent Labs Lab 12/15/14 1514  AST 17  ALT 7  ALKPHOS 78  BILITOT 0.5  PROT 6.5  ALBUMIN 3.2*    Recent Labs Lab 12/15/14 1514  LIPASE 31   No results for input(s): AMMONIA in the last 168 hours. CBC:  Recent Labs Lab 12/15/14 1514 12/16/14 0605  WBC 8.0 6.0  NEUTROABS 5.1  --   HGB 13.2 11.2*  HCT 41.9 35.3*  MCV 96.8 98.9  PLT 240 189    Assessment/Plan Present on Admission:  . Colonic diverticular abscess  PLAN: Will continue with IV Cipro and Flagyl.  Continue with IVF and bowel rest.  Surgery following.  She is stable.    Other plans as per orders.  Code Status:  FULL Haskel Khan, MD. [facility] Pager [phone] 7pm to 7am.  12/16/2014, 10:51 AM

## 2014-12-16 NOTE — Progress Notes (Signed)
Utilization review Completed Azaleah Usman RN BSN   

## 2014-12-16 NOTE — Progress Notes (Signed)
Patient stated she had a bowel movement this morning. Stool was loose/watery, and brown in color. Patient stated she was having an increase in pain and weakness. Vitals were normal except for oxygen saturation (83% room air). 2L O2 [place] was placed on patient. Pain medication given and MD notified. No orders at this time. Patient currently resting. Will continue to monitor.

## 2014-12-16 NOTE — Progress Notes (Signed)
INITIAL NUTRITION ASSESSMENT Pt meets criteria for SEVERE MALNUTRITION in the context of ACUTE ILLNESS as evidenced by Loss of 10% bw in 2 weeks and an oral intake that met <50% estimated needs for > 5 days. DOCUMENTATION CODES Per approved criteria  -Severe malnutrition in the context of acute illness or injury   INTERVENTION: Resource Breeze po TID, each supplement provides 250 kcal and 9 grams of protein  NUTRITION DIAGNOSIS: Inadequate oral intake related to N/V and abdominal pain as evidenced by reported loss of 12 lbs in 2 weeks.   Goal: Pt to meet >/= 90% of their estimated nutrition needs   Monitor:  Oral intake and tolerance, procedures, labs  Reason for Assessment: MST  74 y.o. female  Admitting Dx: <principal problem not specified>  ASSESSMENT: 74 y.o. female PMHx diverticulosis, DVT, COPD, chronic constipation, fibromyalgia, history of a stroke. Patient seen for 2 weeks of left lower quadrant abdominal pain that has been gradually increasing. Found to have colonic diverticular abscess  Per notes: Pt having n/v starting 2 days ago. Also w/ 12 lb wt loss in 2 weeks d/t not being able to keep food down  Height: Ht Readings from Last 1 Encounters:  12/15/14 '5\' 3"'  (1.6 m)    Weight: Wt Readings from Last 1 Encounters:  12/15/14 109 lb (49.442 kg)    Ideal Body Weight: 115 lbs  % Ideal Body Weight: 95%  Wt Readings from Last 10 Encounters:  12/15/14 109 lb (49.442 kg)  11/23/14 113 lb 9.6 oz (51.529 kg)  11/07/14 111 lb (50.349 kg)  10/31/14 112 lb (50.803 kg)  04/28/14 114 lb (51.71 kg)  02/15/14 114 lb (51.71 kg)  08/11/13 110 lb 14.4 oz (50.304 kg)  07/20/13 110 lb (49.896 kg)  07/11/13 112 lb (50.803 kg)  06/01/13 109 lb 3.2 oz (49.533 kg)  Admitted at 105 lbs-Pt reported in ED 12 lb wt loss in 2 weeks  Usual Body Weight: appears to be 110-115 lbs  % Usual Body Weight: 95%  BMI:  Body mass index is 19.31 kg/(m^2).  Estimated Nutritional  Needs: Kcal: 1700-1850 (34-37 kcal/kg) Protein: >75 g (1.5g/kg) Fluid: 1.6 liters  Skin:  WDL  Diet Order: Diet clear liquid Room service appropriate?: Yes; Fluid consistency:: Thin  EDUCATION NEEDS: -No education needs identified at this time   Intake/Output Summary (Last 24 hours) at 12/16/14 1245 Last data filed at 12/16/14 0850  Gross per 24 hour  Intake    570 ml  Output    100 ml  Net    470 ml    Last BM: 4/16  Labs:   Recent Labs Lab 12/13/14 1204 12/15/14 1514 12/16/14 0605  NA  --  140 139  K  --  4.0 3.6  CL  --  101 103  CO2  --  30 31  BUN  --  19 12  CREATININE 1.10 0.84 0.79  CALCIUM  --  9.0 8.3*  GLUCOSE  --  95 125*    CBG (last 3)  No results for input(s): GLUCAP in the last 72 hours.  Scheduled Meds: . aspirin EC  325 mg Oral Daily  . ciprofloxacin  400 mg Intravenous Q12H  . enoxaparin (LOVENOX) injection  40 mg Subcutaneous Q24H  . feeding supplement (RESOURCE BREEZE)  1 Container Oral TID BM  . gabapentin  900 mg Oral TID  . ipratropium-albuterol  3 mL Nebulization TID  . Linaclotide  145 mcg Oral Daily  . metronidazole  500 mg  Intravenous Q8H    Continuous Infusions: . sodium chloride 75 mL/hr at 12/16/14 0931    Past Medical History  Diagnosis Date  . Constipation   . Depression with anxiety   . Diverticulosis   . DVT (deep venous thrombosis)   . COPD (chronic obstructive pulmonary disease)     Emphysema radiographically  . Carotid artery occlusion   . Anxiety   . TIA (transient ischemic attack)   . Full dentures   . Pulmonary nodules 08/11/2013  . Migraine   . Fibromyalgia   . Stroke     Past Surgical History  Procedure Laterality Date  . Abdominal hysterectomy  1971  . Back surgery      X3  . Colonoscopy  June 2002    Dr. Ferdinand Lango: severe diverticular disease with haustral hypertrophy, primary and secondary diverticulosis, persistent spasticity, early stenosis  . Colonoscopy with propofol N/A 06/21/2013     Dr. Oneida Alar: sigmoid colon and descending colon with diverticula, small internal hemorrhoids  . Tonsillectomy    . Appendectomy    . Eye surgery      both cataracts-lenses  . Nasal sinus surgery Right 07/11/2013    Procedure: RIGHT ENDOSCOPIC ANTERIOR ETHMOIDECTOMY/RIGHT ENDOSCOPIC MAXILLARY ANTROSTOMY WITH REMOVAL OF TISSUE;  Surgeon: Ascencion Dike, MD;  Location: [place];  Service: ENT;  Laterality: Right;  . Esophagogastroduodenoscopy N/A 03/06/2014    Procedure: ESOPHAGOGASTRODUODENOSCOPY (EGD);  Surgeon: Danie Binder, MD;  [facility];  Service: Endoscopy;  Laterality: N/A;  9;30  . Savory dilation N/A 03/06/2014    Procedure: SAVORY DILATION;  Surgeon: Danie Binder, MD;  [facility];  Service: Endoscopy;  Laterality: N/A;  . Esophageal biopsy  03/06/2014    Procedure: BIOPSY;  Surgeon: Danie Binder, MD;  [facility];  Service: Endoscopy;;  . Other surgical history      scar tissue removal x2   Burtis Junes RD, LDN Nutrition Pager: [phone] 12/16/2014 12:46 PM

## 2014-12-16 NOTE — Consult Note (Signed)
Reason for Consult: Sigmoid diverticulitis with contained perforation Referring Physician: Hospitalist  Lindsay Mitchell is an 74 y.o. female.  HPI: Patient is a 74 year old white female with multiple medical problems who over the past week has had worsening left lower quadrant abdominal pain. She has a history of known diverticulosis. She has never been hospitalized for diverticulitis. CT scan the abdomen revealed a 2 cm contained perforation within the sigmoid colon region, though there was some question as to whether this could be related to the left ovary. She states she has been followed by GI for chronic constipation. Currently, her pain extends from left lower quadrant towards her back.  Past Medical History  Diagnosis Date  . Constipation   . Depression with anxiety   . Diverticulosis   . DVT (deep venous thrombosis)   . COPD (chronic obstructive pulmonary disease)     Emphysema radiographically  . Carotid artery occlusion   . Anxiety   . TIA (transient ischemic attack)   . Full dentures   . Pulmonary nodules 08/11/2013  . Migraine   . Fibromyalgia   . Stroke     Past Surgical History  Procedure Laterality Date  . Abdominal hysterectomy  1971  . Back surgery      X3  . Colonoscopy  June 2002    Dr. Ferdinand Lango: severe diverticular disease with haustral hypertrophy, primary and secondary diverticulosis, persistent spasticity, early stenosis  . Colonoscopy with propofol N/A 06/21/2013    Dr. Oneida Alar: sigmoid colon and descending colon with diverticula, small internal hemorrhoids  . Tonsillectomy    . Appendectomy    . Eye surgery      both cataracts-lenses  . Nasal sinus surgery Right 07/11/2013    Procedure: RIGHT ENDOSCOPIC ANTERIOR ETHMOIDECTOMY/RIGHT ENDOSCOPIC MAXILLARY ANTROSTOMY WITH REMOVAL OF TISSUE;  Surgeon: Ascencion Dike, MD;  Location: [facility];  Service: ENT;  Laterality: Right;  . Esophagogastroduodenoscopy N/A 03/06/2014    Procedure:  ESOPHAGOGASTRODUODENOSCOPY (EGD);  Surgeon: Danie Binder, MD;  [facility];  Service: Endoscopy;  Laterality: N/A;  9;30  . Savory dilation N/A 03/06/2014    Procedure: SAVORY DILATION;  Surgeon: Danie Binder, MD;  [facility];  Service: Endoscopy;  Laterality: N/A;  . Esophageal biopsy  03/06/2014    Procedure: BIOPSY;  Surgeon: Danie Binder, MD;  [facility];  Service: Endoscopy;;  . Other surgical history      scar tissue removal x2    Family History  Problem Relation Age of Onset  . Colon cancer Neg Hx   . Breast cancer Mother   . Heart disease Mother   . Hypertension Mother   . Heart attack Mother   . Other Mother     varicose veins  . Cancer Mother   . Varicose Veins Mother   . Diabetes Brother   . Hypertension Brother   . Heart disease Brother   . Throat cancer Father   . Cancer Father     Social History:  reports that she has been smoking Cigarettes.  She has a 50 pack-year smoking history. She does not have any smokeless tobacco history on file. She reports that she does not drink alcohol or use illicit drugs.  Allergies:  Allergies  Allergen Reactions  . Codeine Other (See Comments)    Makes patient not in right state of mind.   . Oxycodone Itching  . Talwin [Pentazocine] Other (See Comments)    Makes patient not in  right state of mind.   . Valium [Diazepam] Other (See Comments)    Just doesn't work.     Medications: I have reviewed the patient's current medications.  Results for orders placed or performed during the hospital encounter of 12/15/14 (from the past 48 hour(s))  Comprehensive metabolic panel     Status: Abnormal   Collection Time: 12/15/14  3:14 PM  Result Value Ref Range   Sodium 140 135 - 145 mmol/L   Potassium 4.0 3.5 - 5.1 mmol/L   Chloride 101 96 - 112 mmol/L   CO2 30 19 - 32 mmol/L   Glucose, Bld 95 70 - 99 mg/dL   BUN 19 6 - 23 mg/dL   Creatinine, Ser 0.84 0.50 - 1.10 mg/dL   Calcium 9.0 8.4 -  10.5 mg/dL   Total Protein 6.5 6.0 - 8.3 g/dL   Albumin 3.2 (L) 3.5 - 5.2 g/dL   AST 17 0 - 37 U/L   ALT 7 0 - 35 U/L   Alkaline Phosphatase 78 39 - 117 U/L   Total Bilirubin 0.5 0.3 - 1.2 mg/dL   GFR calc non Af Amer 68 (L) >90 mL/min   GFR calc Af Amer 79 (L) >90 mL/min    Comment: (NOTE) The eGFR has been calculated using the CKD EPI equation. This calculation has not been validated in all clinical situations. eGFR's persistently <90 mL/min signify possible Chronic Kidney Disease.    Anion gap 9 5 - 15  Lipase, blood     Status: None   Collection Time: 12/15/14  3:14 PM  Result Value Ref Range   Lipase 31 11 - 59 U/L  CBC with Differential/Platelet     Status: None   Collection Time: 12/15/14  3:14 PM  Result Value Ref Range   WBC 8.0 4.0 - 10.5 K/uL   RBC 4.33 3.87 - 5.11 MIL/uL   Hemoglobin 13.2 12.0 - 15.0 g/dL   HCT 41.9 36.0 - 46.0 %   MCV 96.8 78.0 - 100.0 fL   MCH 30.5 26.0 - 34.0 pg   MCHC 31.5 30.0 - 36.0 g/dL   RDW 12.7 11.5 - 15.5 %   Platelets 240 150 - 400 K/uL   Neutrophils Relative % 63 43 - 77 %   Neutro Abs 5.1 1.7 - 7.7 K/uL   Lymphocytes Relative 24 12 - 46 %   Lymphs Abs 1.9 0.7 - 4.0 K/uL   Monocytes Relative 11 3 - 12 %   Monocytes Absolute 0.8 0.1 - 1.0 K/uL   Eosinophils Relative 1 0 - 5 %   Eosinophils Absolute 0.1 0.0 - 0.7 K/uL   Basophils Relative 1 0 - 1 %   Basophils Absolute 0.1 0.0 - 0.1 K/uL  Basic metabolic panel     Status: Abnormal   Collection Time: 12/16/14  6:05 AM  Result Value Ref Range   Sodium 139 135 - 145 mmol/L   Potassium 3.6 3.5 - 5.1 mmol/L   Chloride 103 96 - 112 mmol/L   CO2 31 19 - 32 mmol/L   Glucose, Bld 125 (H) 70 - 99 mg/dL   BUN 12 6 - 23 mg/dL   Creatinine, Ser 0.79 0.50 - 1.10 mg/dL   Calcium 8.3 (L) 8.4 - 10.5 mg/dL   GFR calc non Af Amer 82 (L) >90 mL/min   GFR calc Af Amer >90 >90 mL/min    Comment: (NOTE) The eGFR has been calculated using the CKD EPI equation. This calculation has not  been  validated in all clinical situations. eGFR's persistently <90 mL/min signify possible Chronic Kidney Disease.    Anion gap 5 5 - 15  CBC     Status: Abnormal   Collection Time: 12/16/14  6:05 AM  Result Value Ref Range   WBC 6.0 4.0 - 10.5 K/uL   RBC 3.57 (L) 3.87 - 5.11 MIL/uL   Hemoglobin 11.2 (L) 12.0 - 15.0 g/dL   HCT 35.3 (L) 36.0 - 46.0 %   MCV 98.9 78.0 - 100.0 fL   MCH 31.4 26.0 - 34.0 pg   MCHC 31.7 30.0 - 36.0 g/dL   RDW 12.8 11.5 - 15.5 %   Platelets 189 150 - 400 K/uL    No results found.  ROS: See chart Blood pressure 98/55, pulse 63, temperature 98.3 F (36.8 C), temperature source Oral, resp. rate 14, height '5\' 3"'  (1.6 m), weight 49.442 kg (109 lb), SpO2 77 %. Physical Exam: Pleasant white female in no acute distress. Abdomen soft with tenderness to deep palpation noted in the left lower quadrant. No rigidity is noted. No masses are noted. No hernias are noted.  Assessment/Plan: Impression: Sigmoid diverticulitis with probable contained abscess. No need for acute surgical intervention at this time. Plan: Would continue bowel rest with clear or full liquid diet. Continue IV antibiotics for now. Will reassess in AM. The management and treatment for this have been explained to the patient and husband, who understand and agree.  Lataja Newland A 12/16/2014, 10:33 AM

## 2014-12-17 DIAGNOSIS — E43 Unspecified severe protein-calorie malnutrition: Secondary | ICD-10-CM

## 2014-12-17 NOTE — Progress Notes (Signed)
[facility] PROGRESS NOTE  Lindsay Mitchell INO:[id number] DOB: [birth year]    PCP:   Glo Herring., MD   HPI: Lindsay Mitchell is an 74 y.o. female with hx of DVT, CVA, COPD, active smoker, fibromyalgia, hx of diverticulosis, admitted by Dr Nehemiah Settle for diverticulitis. She was started on IV Flagyl and IV Cipro, and was placed on NPO. Surgery had seen her, recommended current therapy. Her last colonoscopy was one year ago, no colon cancer.  She is tolerating clear liquid, and being advanced to full liquid.   Rewiew of Systems:  Constitutional: Negative for malaise, fever and chills. No significant weight loss or weight gain Eyes: Negative for eye pain, redness and discharge, diplopia, visual changes, or flashes of light. ENMT: Negative for ear pain, hoarseness, nasal congestion, sinus pressure and sore throat. No headaches; tinnitus, drooling, or problem swallowing. Cardiovascular: Negative for chest pain, palpitations, diaphoresis, dyspnea and peripheral edema. ; No orthopnea, PND Respiratory: Negative for cough, hemoptysis, wheezing and stridor. No pleuritic chestpain. Gastrointestinal: Negative for nausea, vomiting, diarrhea, constipation, abdominal pain, melena, blood in stool, hematemesis, jaundice and rectal bleeding.    Genitourinary: Negative for frequency, dysuria, incontinence,flank pain and hematuria; Musculoskeletal: Negative for back pain and neck pain. Negative for swelling and trauma.;  Skin: . Negative for pruritus, rash, abrasions, bruising and skin lesion.; ulcerations Neuro: Negative for headache, lightheadedness and neck stiffness. Negative for weakness, altered level of consciousness , altered mental status, extremity weakness, burning feet, involuntary movement, seizure and syncope.  Psych: negative for anxiety, depression, insomnia, tearfulness, panic attacks, hallucinations, paranoia, suicidal or homicidal ideation    Past Medical History  Diagnosis Date   . Constipation   . Depression with anxiety   . Diverticulosis   . DVT (deep venous thrombosis)   . COPD (chronic obstructive pulmonary disease)     Emphysema radiographically  . Carotid artery occlusion   . Anxiety   . TIA (transient ischemic attack)   . Full dentures   . Pulmonary nodules 08/11/2013  . Migraine   . Fibromyalgia   . Stroke     Past Surgical History  Procedure Laterality Date  . Abdominal hysterectomy  1971  . Back surgery      X3  . Colonoscopy  June 2002    Dr. Ferdinand Lango: severe diverticular disease with haustral hypertrophy, primary and secondary diverticulosis, persistent spasticity, early stenosis  . Colonoscopy with propofol N/A 06/21/2013    Dr. Oneida Alar: sigmoid colon and descending colon with diverticula, small internal hemorrhoids  . Tonsillectomy    . Appendectomy    . Eye surgery      both cataracts-lenses  . Nasal sinus surgery Right 07/11/2013    Procedure: RIGHT ENDOSCOPIC ANTERIOR ETHMOIDECTOMY/RIGHT ENDOSCOPIC MAXILLARY ANTROSTOMY WITH REMOVAL OF TISSUE;  Surgeon: Ascencion Dike, MD;  Location: [facility];  Service: ENT;  Laterality: Right;  . Esophagogastroduodenoscopy N/A 03/06/2014    Procedure: ESOPHAGOGASTRODUODENOSCOPY (EGD);  Surgeon: Danie Binder, MD;  [facility];  Service: Endoscopy;  Laterality: N/A;  9;30  . Savory dilation N/A 03/06/2014    Procedure: SAVORY DILATION;  Surgeon: Danie Binder, MD;  [facility];  Service: Endoscopy;  Laterality: N/A;  . Esophageal biopsy  03/06/2014    Procedure: BIOPSY;  Surgeon: Danie Binder, MD;  [facility];  Service: Endoscopy;;  . Other surgical history      scar tissue removal x2    Medications:  HOME MEDS: Prior to Admission medications  Medication Sig Start Date End Date Taking? Authorizing Provider  albuterol (PROVENTIL HFA;VENTOLIN HFA) 108 (90 BASE) MCG/ACT inhaler Inhale 2 puffs into the lungs every 4 (four) hours as needed for wheezing  or shortness of breath. 08/12/13  Yes Rexene Alberts, MD  aspirin EC 325 MG tablet Take 325 mg by mouth daily.   Yes Historical Provider, MD  ciprofloxacin (CIPRO) 500 MG tablet Take 500 mg by mouth 2 (two) times daily. 10 day course starting 12/13/2014 12/13/14  Yes Historical Provider, MD  gabapentin (NEURONTIN) 300 MG capsule Take 3 capsules (900 mg total) by mouth 3 (three) times daily. 11/09/14  Yes Melvenia Beam, MD  HYDROcodone-acetaminophen (NORCO) 10-325 MG per tablet Take 1 tablet by mouth every 6 (six) hours as needed for moderate pain or severe pain.   Yes Historical Provider, MD  ipratropium-albuterol (DUONEB) 0.5-2.5 (3) MG/3ML SOLN Take 3 mLs by nebulization 3 (three) times daily. 08/12/13  Yes Rexene Alberts, MD  LINZESS 145 MCG CAPS capsule TAKE 1 CAPSULE BY MOUTH EVERY DAY 30 MINUTES PRIOR TO THE FIRST MEAL OF THE DAY Patient taking differently: TAKE 1 CAPSULE BY MOUTH EVERY DAY 30 MINUTES PRIOR TO THE FIRST MEAL OF THE DAY AS NEEDED FOR CONSTIPATION 07/18/14  Yes Mahala Menghini, PA-C  metroNIDAZOLE (FLAGYL) 500 MG tablet Take 500 mg by mouth 2 (two) times daily. 10 day course starting on 12/13/2014 12/13/14  Yes Historical Provider, MD  methylPREDNISolone (MEDROL DOSEPAK) 4 MG tablet follow package directions Patient not taking: Reported on 11/23/2014 11/13/14   Melvenia Beam, MD  Oxcarbazepine (TRILEPTAL) 300 MG tablet Take 1 tablet (300 mg total) by mouth 2 (two) times daily. Patient not taking: Reported on 12/15/2014 11/28/14   Melvenia Beam, MD     Allergies:  Allergies  Allergen Reactions  . Codeine Other (See Comments)    Makes patient not in right state of mind.   . Oxycodone Itching  . Talwin [Pentazocine] Other (See Comments)    Makes patient not in right state of mind.   . Valium [Diazepam] Other (See Comments)    Just doesn't work.     Social History:   reports that she has been smoking Cigarettes.  She has a 50 pack-year smoking history. She does not have any  smokeless tobacco history on file. She reports that she does not drink alcohol or use illicit drugs.  Family History: Family History  Problem Relation Age of Onset  . Colon cancer Neg Hx   . Breast cancer Mother   . Heart disease Mother   . Hypertension Mother   . Heart attack Mother   . Other Mother     varicose veins  . Cancer Mother   . Varicose Veins Mother   . Diabetes Brother   . Hypertension Brother   . Heart disease Brother   . Throat cancer Father   . Cancer Father      Physical Exam: Filed Vitals:   12/16/14 1908 12/16/14 2132 12/17/14 0640 12/17/14 0746  BP:  99/42 122/66   Pulse:  70 102   Temp:  98.1 F (36.7 C) 97.3 F (36.3 C)   TempSrc:  Oral Oral   Resp:  18 16   Height:      Weight:      SpO2: 96% 99% 100% 94%   Blood pressure 122/66, pulse 102, temperature 97.3 F (36.3 C), temperature source Oral, resp. rate 16, height 5\' 3"  (1.6 m), weight 49.442 kg (109 lb), SpO2  94 %.  GEN:  Pleasant  patient lying in the stretcher in no acute distress; cooperative with exam. PSYCH:  alert and oriented x4; does not appear anxious or depressed; affect is appropriate. HEENT: Mucous membranes pink and anicteric; PERRLA; EOM intact; no cervical lymphadenopathy nor thyromegaly or carotid bruit; no JVD; There were no stridor. Neck is very supple. Breasts:: Not examined CHEST WALL: No tenderness CHEST: Normal respiration, clear to auscultation bilaterally.  HEART: Regular rate and rhythm.  There are no murmur, rub, or gallops.   BACK: No kyphosis or scoliosis; no CVA tenderness ABDOMEN: soft and non-tender; no masses, no organomegaly, normal abdominal bowel sounds; no pannus; no intertriginous candida. There is no rebound and no distention. Rectal Exam: Not done EXTREMITIES: No bone or joint deformity; age-appropriate arthropathy of the hands and knees; no edema; no ulcerations.  There is no calf tenderness. Genitalia: not examined PULSES: 2+ and symmetric SKIN:  Normal hydration no rash or ulceration CNS: Cranial nerves 2-12 grossly intact no focal lateralizing neurologic deficit.  Speech is fluent; uvula elevated with phonation, facial symmetry and tongue midline. DTR are normal bilaterally, cerebella exam is intact, barbinski is negative and strengths are equaled bilaterally.  No sensory loss.   Labs on Admission:  Basic Metabolic Panel:  Recent Labs Lab 12/13/14 1204 12/15/14 1514 12/16/14 0605  NA  --  140 139  K  --  4.0 3.6  CL  --  101 103  CO2  --  30 31  GLUCOSE  --  95 125*  BUN  --  19 12  CREATININE 1.10 0.84 0.79  CALCIUM  --  9.0 8.3*   Liver Function Tests:  Recent Labs Lab 12/15/14 1514  AST 17  ALT 7  ALKPHOS 78  BILITOT 0.5  PROT 6.5  ALBUMIN 3.2*    Recent Labs Lab 12/15/14 1514  LIPASE 31   No results for input(s): AMMONIA in the last 168 hours. CBC:  Recent Labs Lab 12/15/14 1514 12/16/14 0605  WBC 8.0 6.0  NEUTROABS 5.1  --   HGB 13.2 11.2*  HCT 41.9 35.3*  MCV 96.8 98.9  PLT 240 189    Assessment/Plan Present on Admission:  . Colonic diverticular abscess   PLAN:  Will continue with IVF, IV cipro and iV Flagyl.  Advancing diet as per surgery.   Encourage tobacco cessation.   Other plans as per orders.  Code Status: FULL Haskel Khan, MD. [facility] Pager [phone] 7pm to 7am.  12/17/2014, 11:36 AM

## 2014-12-17 NOTE — Progress Notes (Signed)
  Subjective: Patient still with left lower quadrant abdominal pain, but does feel somewhat better. She has had a bowel movement. She is tolerating clear liquid diet.  Objective: Vital signs in last 24 hours: Temp:  [97.3 F (36.3 C)-98.1 F (36.7 C)] 97.3 F (36.3 C) (04/17 0640) Pulse Rate:  [68-102] 102 (04/17 0640) Resp:  [13-18] 16 (04/17 0640) BP: (99-122)/(42-66) 122/66 mmHg (04/17 0640) SpO2:  [90 %-100 %] 94 % (04/17 0746) Last BM Date: 12/16/14  Intake/Output from previous day: 04/16 0701 - 04/17 0700 In: 1161.3 [P.O.:120; I.V.:641.3; IV Piggyback:400] Out: 1150 [Urine:1150] Intake/Output this shift:    General appearance: alert, cooperative and no distress GI: Soft with tenderness in left lower quadrant to palpation. No rigidity noted. No distention noted.  Lab Results:   Recent Labs  12/15/14 1514 12/16/14 0605  WBC 8.0 6.0  HGB 13.2 11.2*  HCT 41.9 35.3*  PLT 240 189   BMET  Recent Labs  12/15/14 1514 12/16/14 0605  NA 140 139  K 4.0 3.6  CL 101 103  CO2 30 31  GLUCOSE 95 125*  BUN 19 12  CREATININE 0.84 0.79  CALCIUM 9.0 8.3*   PT/INR No results for input(s): LABPROT, INR in the last 72 hours.  Studies/Results: No results found.  Anti-infectives: Anti-infectives    Start     Dose/Rate Route Frequency Ordered Stop   12/16/14 0400  ciprofloxacin (CIPRO) IVPB 400 mg     400 mg 200 mL/hr over 60 Minutes Intravenous Every 12 hours 12/15/14 1837     12/16/14 0100  metroNIDAZOLE (FLAGYL) IVPB 500 mg     500 mg 100 mL/hr over 60 Minutes Intravenous Every 8 hours 12/15/14 1837     12/15/14 1515  metroNIDAZOLE (FLAGYL) IVPB 500 mg     500 mg 100 mL/hr over 60 Minutes Intravenous  Once 12/15/14 1506 12/15/14 1733   12/15/14 1515  ciprofloxacin (CIPRO) IVPB 400 mg     400 mg 200 mL/hr over 60 Minutes Intravenous  Once 12/15/14 1506 12/15/14 1631      Assessment/Plan: Impression: Sigmoid diverticulitis, slowly resolving Plan: Would  continue IV antibiotics. Patient will need a total of 2 weeks of by mouth antibiotics upon discharge. We'll advance to full liquid diet.  LOS: 2 days    Bertel Venard A 12/17/2014

## 2014-12-18 ENCOUNTER — Inpatient Hospital Stay (HOSPITAL_COMMUNITY): Payer: Medicare Other

## 2014-12-18 ENCOUNTER — Other Ambulatory Visit: Payer: Self-pay

## 2014-12-18 DIAGNOSIS — R079 Chest pain, unspecified: Secondary | ICD-10-CM | POA: Diagnosis not present

## 2014-12-18 DIAGNOSIS — J90 Pleural effusion, not elsewhere classified: Secondary | ICD-10-CM | POA: Diagnosis present

## 2014-12-18 LAB — TROPONIN I
Troponin I: <0.03 ng/mL (ref <0.031)
Troponin I: <0.03 ng/mL (ref <0.031)

## 2014-12-18 MED ORDER — HYDROMORPHONE HCL 1 MG/ML IJ SOLN
1.0000 mg | INTRAMUSCULAR | Status: DC | PRN
  Administered 2014-12-18 – 2014-12-21 (×4): 1 mg via INTRAVENOUS
  Filled 2014-12-18 (×4): qty 1

## 2014-12-18 MED ORDER — IPRATROPIUM-ALBUTEROL 0.5-2.5 (3) MG/3ML IN SOLN
3.0000 mL | RESPIRATORY_TRACT | Status: DC
  Administered 2014-12-18 – 2014-12-20 (×11): 3 mL via RESPIRATORY_TRACT
  Filled 2014-12-18 (×12): qty 3

## 2014-12-18 NOTE — Progress Notes (Signed)
Patient complaining of sharp chest pain across chest. Worse with deep breathing. Also complaining of shortness of breath. Vital signs stable. Dyanne Carrel NP notified. She will see patient.

## 2014-12-18 NOTE — Progress Notes (Signed)
[facility] PROGRESS NOTE  Lindsay Mitchell MOQ:[id number] DOB: October 12, 1940 DOA: 12/15/2014 PCP: Glo Herring., MD  Assessment/Plan:  Colonic diverticular abscess: evaluated by general surgery who opine sigmoid diverticulitis with possible abscess that is resolving. She is afebrile and non-toxic appearing. Cipro and flagyl day #4. Continue IV fluids at slower rate. Encourage full liquid Active Problems: Chest pain: atypical. Somewhat reproducible.  Initial troponin negative. EKG with SR and incomplete LBBB. No acute changes.  Chest xray with small left pleural effusion and associated airspace opacity; atelectasis versus pneumonia. Recommend a repeat radiograph in 3-4 weeks to document resolution. Oxygen saturation level >90% on 2L. Will cycle troponin, monitor on tele. Provided with pain med and mylanta   Depression with anxiety: appears stable at baseline. Continue home med    Protein-calorie malnutrition, severe: bmi 18.6 evaluated by nutritional services who recommend supplement.       Code Status: full Family Communication: husband at bedside Disposition Plan: home hopefully tomorrow   Consultants:  General surgery  Procedures:  none  Antibiotics:  cipro 12/15/14>>  Flagyl 12/15/14>>  HPI/Subjective: Awake complains of chest pain worse with deep breath  Objective: Filed Vitals:   12/18/14 0847  BP: 116/54  Pulse: 98  Temp:   Resp:     Intake/Output Summary (Last 24 hours) at 12/18/14 1133 Last data filed at 12/18/14 0945  Gross per 24 hour  Intake    240 ml  Output    200 ml  Net     40 ml   [facility] Weights   12/15/14 1207 12/15/14 1838  Weight: 47.628 kg (105 lb) 49.442 kg (109 lb)    Exam:   General:  Thin frail  Cardiovascular: RRR no MGR no LE edema  Respiratory: normal effort but shallow BS distant with fair air movement  Abdomen: +BS non-distended   Musculoskeletal: no clubbing or cyanosis   Data Reviewed: Basic Metabolic  Panel:  Recent Labs Lab 12/13/14 1204 12/15/14 1514 12/16/14 0605  NA  --  140 139  K  --  4.0 3.6  CL  --  101 103  CO2  --  30 31  GLUCOSE  --  95 125*  BUN  --  19 12  CREATININE 1.10 0.84 0.79  CALCIUM  --  9.0 8.3*   Liver Function Tests:  Recent Labs Lab 12/15/14 1514  AST 17  ALT 7  ALKPHOS 78  BILITOT 0.5  PROT 6.5  ALBUMIN 3.2*    Recent Labs Lab 12/15/14 1514  LIPASE 31   No results for input(s): AMMONIA in the last 168 hours. CBC:  Recent Labs Lab 12/15/14 1514 12/16/14 0605  WBC 8.0 6.0  NEUTROABS 5.1  --   HGB 13.2 11.2*  HCT 41.9 35.3*  MCV 96.8 98.9  PLT 240 189   Cardiac Enzymes:  Recent Labs Lab 12/18/14 0906  TROPONINI <0.03   BNP (last 3 results) No results for input(s): BNP in the last 8760 hours.  ProBNP (last 3 results) No results for input(s): PROBNP in the last 8760 hours.  CBG: No results for input(s): GLUCAP in the last 168 hours.  No results found for this or any previous visit (from the past 240 hour(s)).   Studies: Dg Chest 2 View  12/18/2014   CLINICAL DATA:  Left-sided rib pain and cough.  Shortness of breath.  EXAM: CHEST  2 VIEW  COMPARISON:  12/13/2014 abdominal CT, 04/28/2014 radiograph  FINDINGS: Small left pleural effusion and associated airspace opacity. Background interstitial coarsening  and hyperinflation. Aortic atherosclerosis. Normal heart size. Osteopenia and multilevel degenerative changes.  IMPRESSION: Small left pleural effusion and associated airspace opacity; atelectasis versus pneumonia. Recommend a repeat radiograph in 3-4 weeks to document resolution.  Background COPD.   Electronically Signed   By: Carlos Levering M.D.   On: 12/18/2014 06:53    Scheduled Meds: . aspirin EC  325 mg Oral Daily  . ciprofloxacin  400 mg Intravenous Q12H  . enoxaparin (LOVENOX) injection  40 mg Subcutaneous Q24H  . feeding supplement (RESOURCE BREEZE)  1 Container Oral TID BM  . gabapentin  900 mg Oral TID  .  ipratropium-albuterol  3 mL Nebulization Q4H  . Linaclotide  145 mcg Oral Daily  . metronidazole  500 mg Intravenous Q8H   Continuous Infusions: . sodium chloride 50 mL/hr at 12/18/14 1039    Principal Problem:    Time spent: 25 minutes    [facility] Pager [phone]. If 7PM-7AM, please contact night-coverage at [email or web address], password [facility] 12/18/2014, 11:33 AM  LOS: 3 days

## 2014-12-18 NOTE — Progress Notes (Signed)
Text paged Dr. Marin Comment, notified of patient complaints and nebs, meds, and EKG done.  Arrived to floor, ordered chest x-ray.

## 2014-12-18 NOTE — Progress Notes (Signed)
  Subjective: Complains of chest pain. States her lower abdominal pain has improved. She continues to have bowel movements. She does not have an appetite at the present time.  Objective: Vital signs in last 24 hours: Temp:  [98.1 F (36.7 C)-98.2 F (36.8 C)] 98.1 F (36.7 C) (04/18 0634) Pulse Rate:  [68-98] 98 (04/18 0847) Resp:  [16-18] 16 (04/18 0634) BP: (101-116)/(54-57) 116/54 mmHg (04/18 0847) SpO2:  [92 %-98 %] 95 % (04/18 0847) Last BM Date: 12/16/14  Intake/Output from previous day: 04/17 0701 - 04/18 0700 In: 360 [P.O.:360] Out: 300 [Urine:300] Intake/Output this shift:    General appearance: alert, cooperative, fatigued and mild distress GI: Soft with tenderness down the left lower quadrant to palpation. No rigidity noted. No distention noted.  Lab Results:   Recent Labs  12/15/14 1514 12/16/14 0605  WBC 8.0 6.0  HGB 13.2 11.2*  HCT 41.9 35.3*  PLT 240 189   BMET  Recent Labs  12/15/14 1514 12/16/14 0605  NA 140 139  K 4.0 3.6  CL 101 103  CO2 30 31  GLUCOSE 95 125*  BUN 19 12  CREATININE 0.84 0.79  CALCIUM 9.0 8.3*   PT/INR No results for input(s): LABPROT, INR in the last 72 hours.  Studies/Results: Dg Chest 2 View  12/18/2014   CLINICAL DATA:  Left-sided rib pain and cough.  Shortness of breath.  EXAM: CHEST  2 VIEW  COMPARISON:  12/13/2014 abdominal CT, 04/28/2014 radiograph  FINDINGS: Small left pleural effusion and associated airspace opacity. Background interstitial coarsening and hyperinflation. Aortic atherosclerosis. Normal heart size. Osteopenia and multilevel degenerative changes.  IMPRESSION: Small left pleural effusion and associated airspace opacity; atelectasis versus pneumonia. Recommend a repeat radiograph in 3-4 weeks to document resolution.  Background COPD.   Electronically Signed   By: Carlos Levering M.D.   On: 12/18/2014 06:53    Anti-infectives: Anti-infectives    Start     Dose/Rate Route Frequency Ordered Stop   12/16/14 0400  ciprofloxacin (CIPRO) IVPB 400 mg     400 mg 200 mL/hr over 60 Minutes Intravenous Every 12 hours 12/15/14 1837     12/16/14 0100  metroNIDAZOLE (FLAGYL) IVPB 500 mg     500 mg 100 mL/hr over 60 Minutes Intravenous Every 8 hours 12/15/14 1837     12/15/14 1515  metroNIDAZOLE (FLAGYL) IVPB 500 mg     500 mg 100 mL/hr over 60 Minutes Intravenous  Once 12/15/14 1506 12/15/14 1733   12/15/14 1515  ciprofloxacin (CIPRO) IVPB 400 mg     400 mg 200 mL/hr over 60 Minutes Intravenous  Once 12/15/14 1506 12/15/14 1631      Assessment/Plan: Impression: Sigmoid diverticulitis with possible abscess, resolving Nonspecific chest pain, currently being worked up. Plan: Continue IV antibiotics as tolerated. May advance diet as tolerated.  LOS: 3 days    Nycole Kawahara A 12/18/2014

## 2014-12-18 NOTE — Progress Notes (Signed)
Called to room by patient.  Complaining of difficulty breathing with discomfort in left ribs radiating to chest, congested cough and SOB.  Called respiratory, ordered PRN neb.  Fentanyl given for pain, Mylanta for indigestion.  Patient stated feeling like she did when she had pneumonia in past.  Respiratory encouraged patient to cough, blood tinged sputum expectorated.  Will continue to monitor.

## 2014-12-18 NOTE — Progress Notes (Signed)
Notified by patient that she was still having chest pain that was constant. NP was notified. NP came an assessed patient. NP placed new orders at this time. Patients VS were within normal limits. EKG was completed. Will place patient on telemetry monitor at this time. Will continue to monitor at this time.

## 2014-12-18 NOTE — Care Management Note (Addendum)
    Page 1 of 2   12/28/2014     11:32:27 AM CARE MANAGEMENT NOTE 12/28/2014  Patient:  Lindsay Mitchell, Lindsay Mitchell   Account Number:  [id number]  Date Initiated:  12/18/2014  Documentation initiated by:  Theophilus Kinds  Subjective/Objective Assessment:   Pt admitted from home with diverticulitis with abcess. Pt lives with her husband and will return home at discharge. Pt is independent with ADL's. Pt has home O2 and neb machine with [facility].     Action/Plan:   No CM needs noted.   Anticipated DC Date:  12/28/2014   Anticipated DC Plan:  [place]  CM consult      Choice offered to / List presented to:  C-1 Patient   DME arranged  [facility]      DME agency  [place] arranged  HH-1 RN  [place]      [place].   Status of service:  Completed, signed off Medicare Important Message given?  YES (If response is "NO", the following Medicare IM given date fields will be blank) Date Medicare IM given:  12/21/2014 Medicare IM given by:  Elissa Hefty Date Additional Medicare IM given:  12/25/2014 Additional Medicare IM given by:  Marvetta Gibbons  Discharge Disposition:  HOME/SELF CARE  Per UR Regulation:  Reviewed for med. necessity/level of care/duration of stay  If discussed at [place] of Stay Meetings, dates discussed:   12/21/2014  12/26/2014    Comments:  12/28/14 Elenor Quinones RN BSN.  Medicare Important Message given? YES   (If response is "NO", the following Medicare IM given date fields will be blank)   Date Medicare IM given: 12/28/14 Medicare IM given by:  Elenor Quinones RNCM. BSN    Pt was provided choice for [facility] and DME as ordered.  Pt selected [facility] for both [facility] and DME.  CM verified post discharge location and phone number was the same as documeneted in [place].  CM contacted Butch Penny from [place], referral was accepted, address  verified.  CM contacted [place] with [facility], referral was accepted.  Both advanced resources were informed that address and contact number in epic remains accurate and that pt will be discharged today.  No additonal CM needs at this time.   12/18/14 [place], RN BSN CM

## 2014-12-19 ENCOUNTER — Inpatient Hospital Stay (HOSPITAL_COMMUNITY): Payer: Medicare Other

## 2014-12-19 ENCOUNTER — Encounter (HOSPITAL_COMMUNITY): Payer: Self-pay | Admitting: Physician Assistant

## 2014-12-19 DIAGNOSIS — F418 Other specified anxiety disorders: Secondary | ICD-10-CM

## 2014-12-19 DIAGNOSIS — I959 Hypotension, unspecified: Secondary | ICD-10-CM | POA: Diagnosis present

## 2014-12-19 DIAGNOSIS — R079 Chest pain, unspecified: Secondary | ICD-10-CM

## 2014-12-19 DIAGNOSIS — R0902 Hypoxemia: Secondary | ICD-10-CM | POA: Diagnosis present

## 2014-12-19 DIAGNOSIS — R778 Other specified abnormalities of plasma proteins: Secondary | ICD-10-CM | POA: Diagnosis present

## 2014-12-19 DIAGNOSIS — R7989 Other specified abnormal findings of blood chemistry: Secondary | ICD-10-CM

## 2014-12-19 DIAGNOSIS — D649 Anemia, unspecified: Secondary | ICD-10-CM | POA: Diagnosis present

## 2014-12-19 LAB — CBC
HCT: 34.1 % — ABNORMAL LOW (ref 36.0–46.0)
Hemoglobin: 10.3 g/dL — ABNORMAL LOW (ref 12.0–15.0)
MCH: 30.2 pg (ref 26.0–34.0)
MCHC: 30.2 g/dL (ref 30.0–36.0)
MCV: 100 fL (ref 78.0–100.0)
Platelets: 188 10*3/uL (ref 150–400)
RBC: 3.41 MIL/uL — ABNORMAL LOW (ref 3.87–5.11)
RDW: 13.3 % (ref 11.5–15.5)
WBC: 9.5 10*3/uL (ref 4.0–10.5)

## 2014-12-19 LAB — COMPREHENSIVE METABOLIC PANEL
ALT: 7 U/L (ref 0–35)
AST: 24 U/L (ref 0–37)
Albumin: 2.4 g/dL — ABNORMAL LOW (ref 3.5–5.2)
Alkaline Phosphatase: 62 U/L (ref 39–117)
Anion gap: 6 (ref 5–15)
BUN: 5 mg/dL — ABNORMAL LOW (ref 6–23)
CO2: 29 mmol/L (ref 19–32)
Calcium: 7.9 mg/dL — ABNORMAL LOW (ref 8.4–10.5)
Chloride: 106 mmol/L (ref 96–112)
Creatinine, Ser: 1.12 mg/dL — ABNORMAL HIGH (ref 0.50–1.10)
GFR calc Af Amer: 56 mL/min — ABNORMAL LOW (ref >90)
GFR calc non Af Amer: 48 mL/min — ABNORMAL LOW (ref >90)
Glucose, Bld: 116 mg/dL — ABNORMAL HIGH (ref 70–99)
Potassium: 3.4 mmol/L — ABNORMAL LOW (ref 3.5–5.1)
Sodium: 141 mmol/L (ref 135–145)
Total Bilirubin: 0.6 mg/dL (ref 0.3–1.2)
Total Protein: 4.8 g/dL — ABNORMAL LOW (ref 6.0–8.3)

## 2014-12-19 LAB — HEPARIN LEVEL (UNFRACTIONATED): Heparin Unfractionated: 0.29 IU/mL — ABNORMAL LOW (ref 0.30–0.70)

## 2014-12-19 LAB — LACTIC ACID, PLASMA
Lactic Acid, Venous: 2 mmol/L (ref 0.5–2.0)
Lactic Acid, Venous: 1.9 mmol/L (ref 0.5–2.0)

## 2014-12-19 LAB — TROPONIN I
Troponin I: 1.91 ng/mL (ref <0.031)
Troponin I: 1.76 ng/mL (ref <0.031)
Troponin I: 1.62 ng/mL (ref <0.031)
Troponin I: 1.64 ng/mL (ref <0.031)

## 2014-12-19 LAB — APTT: aPTT: 46 seconds — ABNORMAL HIGH (ref 24–37)

## 2014-12-19 LAB — PROTIME-INR
INR: 1.43 (ref 0.00–1.49)
Prothrombin Time: 17.6 seconds — ABNORMAL HIGH (ref 11.6–15.2)

## 2014-12-19 LAB — MRSA PCR SCREENING: MRSA by PCR: NEGATIVE

## 2014-12-19 LAB — PROCALCITONIN: Procalcitonin: 0.43 ng/mL

## 2014-12-19 MED ORDER — SODIUM CHLORIDE 0.9 % IV BOLUS (SEPSIS)
500.0000 mL | Freq: Once | INTRAVENOUS | Status: AC
  Administered 2014-12-19: 500 mL via INTRAVENOUS

## 2014-12-19 MED ORDER — IOHEXOL 300 MG/ML  SOLN
100.0000 mL | Freq: Once | INTRAMUSCULAR | Status: AC | PRN
  Administered 2014-12-19: 100 mL via INTRAVENOUS

## 2014-12-19 MED ORDER — DOPAMINE-DEXTROSE 3.2-5 MG/ML-% IV SOLN
0.0000 ug/kg/min | INTRAVENOUS | Status: DC
  Administered 2014-12-19: 2.5 ug/kg/min via INTRAVENOUS
  Administered 2014-12-19: 5 ug/kg/min via INTRAVENOUS
  Filled 2014-12-19: qty 250

## 2014-12-19 MED ORDER — HEPARIN (PORCINE) IN NACL 100-0.45 UNIT/ML-% IJ SOLN
850.0000 [IU]/h | INTRAMUSCULAR | Status: DC
  Administered 2014-12-19: 650 [IU]/h via INTRAVENOUS
  Filled 2014-12-19: qty 250

## 2014-12-19 MED ORDER — VANCOMYCIN HCL 500 MG IV SOLR
500.0000 mg | Freq: Two times a day (BID) | INTRAVENOUS | Status: DC
  Administered 2014-12-19 – 2014-12-21 (×4): 500 mg via INTRAVENOUS
  Filled 2014-12-19 (×8): qty 500

## 2014-12-19 MED ORDER — PIPERACILLIN-TAZOBACTAM 3.375 G IVPB
3.3750 g | Freq: Three times a day (TID) | INTRAVENOUS | Status: DC
  Administered 2014-12-19 – 2014-12-22 (×9): 3.375 g via INTRAVENOUS
  Filled 2014-12-19 (×14): qty 50

## 2014-12-19 MED ORDER — IOHEXOL 300 MG/ML  SOLN: 80.0000 mL | Freq: Once | INTRAMUSCULAR | Status: AC | PRN

## 2014-12-19 MED ORDER — IOHEXOL 350 MG/ML SOLN
80.0000 mL | Freq: Once | INTRAVENOUS | Status: AC | PRN
  Administered 2014-12-19: 80 mL via INTRAVENOUS

## 2014-12-19 MED ORDER — HEPARIN BOLUS VIA INFUSION
2000.0000 [IU] | Freq: Once | INTRAVENOUS | Status: AC
  Administered 2014-12-19: 2000 [IU] via INTRAVENOUS
  Filled 2014-12-19: qty 2000

## 2014-12-19 MED ORDER — SODIUM CHLORIDE 0.9 % IV BOLUS (SEPSIS)
1000.0000 mL | Freq: Once | INTRAVENOUS | Status: AC
  Administered 2014-12-19: 1000 mL via INTRAVENOUS

## 2014-12-19 MED ORDER — PIPERACILLIN-TAZOBACTAM 3.375 G IVPB 30 MIN
3.3750 g | Freq: Once | INTRAVENOUS | Status: DC
  Filled 2014-12-19: qty 50

## 2014-12-19 MED ORDER — PIPERACILLIN-TAZOBACTAM 3.375 G IVPB
3.3750 g | Freq: Three times a day (TID) | INTRAVENOUS | Status: DC
  Filled 2014-12-19 (×3): qty 50

## 2014-12-19 MED ORDER — VANCOMYCIN HCL 10 G IV SOLR
1250.0000 mg | Freq: Once | INTRAVENOUS | Status: AC
  Administered 2014-12-19: 1250 mg via INTRAVENOUS
  Filled 2014-12-19: qty 1250

## 2014-12-19 NOTE — Progress Notes (Signed)
Echo shows normal LV systolic function, nothing from LV standpoint to explain her hypotension. With normal LV and lack of ischemic change does not appear troponin elevation is LV related. RV shows subtle changes of enlargement and decreased function on echo. Overall the most likely diangosis remains PE. She has been started on anticoag. Recommend imaging, CT PE if ok from contrast standpoint as it appears she has IV contrast with CT A/P this AM or VQ scan. If VQ is intermediate I would consider LE [place]. If PE confirmed and hypotension remains issue would need to touch base with pulm critical care for consideration of lytics. Echo shows large left pleural effusion, dilated IVC, would cut back on IVF.   Zandra Abts MD

## 2014-12-19 NOTE — Progress Notes (Signed)
ANTIBIOTIC CONSULT NOTE - INITIAL  Pharmacy Consult for Vancomycin and Zosyn Indication: sepsis  Allergies  Allergen Reactions  . Codeine Other (See Comments)    Makes patient not in right state of mind.   . Oxycodone Itching  . Talwin [Pentazocine] Other (See Comments)    Makes patient not in right state of mind.   . Valium [Diazepam] Other (See Comments)    Just doesn't work.    Patient Measurements: Height: 5\' 3"  (160 cm) Weight: 120 lb 5.9 oz (54.6 kg) IBW/kg (Calculated) : 52.4  Vital Signs: Temp: 97.9 F (36.6 C) (04/19 0747) Temp Source: Oral (04/19 0747) BP: 64/29 mmHg (04/19 0645) Pulse Rate: 87 (04/19 0645) Intake/Output from previous day: 04/18 0701 - 04/19 0700 In: 2011.3 [P.O.:360; I.V.:1251.3; IV Piggyback:400] Out: 900 [Urine:900] Intake/Output from this shift:    Labs:  Recent Labs  12/19/14 0510  WBC 9.5  HGB 10.3*  PLT 188  CREATININE 1.12*   Estimated Creatinine Clearance: 38.1 mL/min (by C-G formula based on Cr of 1.12). No results for input(s): VANCOTROUGH, VANCOPEAK, VANCORANDOM, GENTTROUGH, GENTPEAK, GENTRANDOM, TOBRATROUGH, TOBRAPEAK, TOBRARND, AMIKACINPEAK, AMIKACINTROU, AMIKACIN in the last 72 hours.   Microbiology: Recent Results (from the past 720 hour(s))  MRSA PCR Screening     Status: None   Collection Time: 12/19/14  5:35 AM  Result Value Ref Range Status   MRSA by PCR NEGATIVE NEGATIVE Final    Comment:        The GeneXpert MRSA Assay (FDA approved for NASAL specimens only), is one component of a comprehensive MRSA colonization surveillance program. It is not intended to diagnose MRSA infection nor to guide or monitor treatment for MRSA infections.    Medical History: Past Medical History  Diagnosis Date  . Constipation   . Depression with anxiety   . Diverticulosis   . DVT (deep venous thrombosis)   . COPD (chronic obstructive pulmonary disease)     Emphysema radiographically  . Carotid artery disease     a.  duplex 11/2593: RICA <63%, LICA 87-56%. Followed by [facility].  . Anxiety   . TIA (transient ischemic attack)   . Full dentures   . Pulmonary nodules 08/11/2013    a. CT angio 08/2013: 4-74mm nodules in RUL/RML, f/u recommended 6 months.  . Migraine   . Fibromyalgia   . Stroke   . Protein calorie malnutrition   . First degree AV block    Anti-infectives    Start     Dose/Rate Route Frequency Ordered Stop   12/19/14 1600  piperacillin-tazobactam (ZOSYN) IVPB 3.375 g  Status:  Discontinued     3.375 g 12.5 mL/hr over 240 Minutes Intravenous Every 8 hours 12/19/14 0805 12/19/14 0923   12/19/14 1000  piperacillin-tazobactam (ZOSYN) IVPB 3.375 g     3.375 g 12.5 mL/hr over 240 Minutes Intravenous Every 8 hours 12/19/14 0923     12/19/14 0900  vancomycin (VANCOCIN) 1,250 mg in sodium chloride 0.9 % 250 mL IVPB     1,250 mg 166.7 mL/hr over 90 Minutes Intravenous  Once 12/19/14 0804     12/19/14 0700  piperacillin-tazobactam (ZOSYN) IVPB 3.375 g  Status:  Discontinued     3.375 g 100 mL/hr over 30 Minutes Intravenous  Once 12/19/14 0659 12/19/14 0924   12/16/14 0400  ciprofloxacin (CIPRO) IVPB 400 mg  Status:  Discontinued     400 mg 200 mL/hr over 60 Minutes Intravenous Every 12 hours 12/15/14 1837 12/19/14 0659   12/16/14 0100  metroNIDAZOLE (FLAGYL)  IVPB 500 mg  Status:  Discontinued     500 mg 100 mL/hr over 60 Minutes Intravenous Every 8 hours 12/15/14 1837 12/19/14 0659   12/15/14 1515  metroNIDAZOLE (FLAGYL) IVPB 500 mg     500 mg 100 mL/hr over 60 Minutes Intravenous  Once 12/15/14 1506 12/15/14 1733   12/15/14 1515  ciprofloxacin (CIPRO) IVPB 400 mg     400 mg 200 mL/hr over 60 Minutes Intravenous  Once 12/15/14 1506 12/15/14 1631     Assessment: 74yo female with persistent hypotension and suspected sepsis.  Pt was being treated for diverticular abscess and was on Cipro and Flagyl.  Asked to initiate Vancomycin and Zosyn.  Cipro and Flagyl d/c'd.  Goal of Therapy:  Vancomycin  trough level 15-20 mcg/ml  Plan:  Zosyn 3.375gm IV q8h, each dose over 4 hrs Vancomycin 1250mg  IV now x 1 dose then Vancomycin 500mg  IV q12hrs Check trough at steady state Monitor labs, renal fxn, progress and cultures  Hart Robinsons A 12/19/2014,9:25 AM

## 2014-12-19 NOTE — Progress Notes (Signed)
[facility] for Heparin Indication: chest pain/ACS  Allergies  Allergen Reactions  . Codeine Other (See Comments)    Makes patient not in right state of mind.   . Oxycodone Itching  . Talwin [Pentazocine] Other (See Comments)    Makes patient not in right state of mind.   . Valium [Diazepam] Other (See Comments)    Just doesn't work.    Patient Measurements: Height: 5\' 3"  (160 cm) Weight: 120 lb 5.9 oz (54.6 kg) IBW/kg (Calculated) : 52.4  Vital Signs: Temp: 98.9 F (37.2 C) (04/19 2001) Temp Source: Oral (04/19 2001) BP: 111/53 mmHg (04/19 1930) Pulse Rate: 83 (04/19 1930)  Labs:  Recent Labs  12/19/14 0510 12/19/14 0735 12/19/14 0911 12/19/14 1200 12/19/14 1752  HGB 10.3*  --   --   --   --   HCT 34.1*  --   --   --   --   PLT 188  --   --   --   --   APTT  --  46*  --   --   --   LABPROT  --  17.6*  --   --   --   INR  --  1.43  --   --   --   HEPARINUNFRC  --   --   --   --  0.29*  CREATININE 1.12*  --   --   --   --   TROPONINI 1.91*  --  1.76* 1.62* 1.64*    Estimated Creatinine Clearance: 38.1 mL/min (by C-G formula based on Cr of 1.12).  Medical History: Past Medical History  Diagnosis Date  . Constipation   . Depression with anxiety   . Diverticulosis   . DVT (deep venous thrombosis)   . COPD (chronic obstructive pulmonary disease)     Emphysema radiographically  . Carotid artery disease     a. duplex 01/7208: RICA <47%, LICA 09-62%. Followed by [facility].  . Anxiety   . TIA (transient ischemic attack)   . Full dentures   . Pulmonary nodules 08/11/2013    a. CT angio 08/2013: 4-21mm nodules in RUL/RML, f/u recommended 6 months.  . Migraine   . Fibromyalgia   . Stroke   . Protein calorie malnutrition   . First degree AV block    Medications:  Prescriptions prior to admission  Medication Sig Dispense Refill Last Dose  . albuterol (PROVENTIL HFA;VENTOLIN HFA) 108 (90 BASE) MCG/ACT inhaler Inhale 2 puffs into  the lungs every 4 (four) hours as needed for wheezing or shortness of breath. 1 Inhaler 12 unknown  . aspirin EC 325 MG tablet Take 325 mg by mouth daily.   12/14/2014 at Unknown time  . ciprofloxacin (CIPRO) 500 MG tablet Take 500 mg by mouth 2 (two) times daily. 10 day course starting 12/13/2014  0 12/15/2014 at Unknown time  . gabapentin (NEURONTIN) 300 MG capsule Take 3 capsules (900 mg total) by mouth 3 (three) times daily. 270 capsule 6 12/14/2014 at Unknown time  . HYDROcodone-acetaminophen (NORCO) 10-325 MG per tablet Take 1 tablet by mouth every 6 (six) hours as needed for moderate pain or severe pain.   12/14/2014 at Unknown time  . ipratropium-albuterol (DUONEB) 0.5-2.5 (3) MG/3ML SOLN Take 3 mLs by nebulization 3 (three) times daily.   unknown  . LINZESS 145 MCG CAPS capsule TAKE 1 CAPSULE BY MOUTH EVERY DAY 30 MINUTES PRIOR TO THE FIRST MEAL OF THE DAY (Patient taking differently: TAKE  1 CAPSULE BY MOUTH EVERY DAY 30 MINUTES PRIOR TO THE FIRST MEAL OF THE DAY AS NEEDED FOR CONSTIPATION) 30 capsule 11 12/14/2014 at Unknown time  . metroNIDAZOLE (FLAGYL) 500 MG tablet Take 500 mg by mouth 2 (two) times daily. 10 day course starting on 12/13/2014  0 12/15/2014 at Unknown time  . methylPREDNISolone (MEDROL DOSEPAK) 4 MG tablet follow package directions (Patient not taking: Reported on 11/23/2014) 21 tablet 1 Not Taking  . Oxcarbazepine (TRILEPTAL) 300 MG tablet Take 1 tablet (300 mg total) by mouth 2 (two) times daily. (Patient not taking: Reported on 12/15/2014) 60 tablet 6     Assessment: 74yo female was being treated for diverticular abscess.  Pt c/o CP and SOB.  Cardiology consulted. Troponins elevated.  Pt being treated for sepsis with hydration and ABX.  Pt has low BP.  Plan to evaluate for possible PE. Baseline aPTT and Protime elevated.  Initial Heparin level slightly below goal.  CT (-) PE.  Goal of Therapy:  Heparin level 0.3-0.7 units/ml w/in 24 hours of initiation Monitor platelets by  anticoagulation protocol: Yes   Plan:   Increase Heparin infusion to 700 units/hr  Check heparin level daily  Check CBC daily while on Heparin  Lindsay Mitchell 12/19/2014,8:14 PM

## 2014-12-19 NOTE — Care Management Utilization Note (Signed)
UR completed 

## 2014-12-19 NOTE — Evaluation (Addendum)
Pt w/ persistent hypotension despite IVFs. Transferred from floor overnight. Minimally to mildly improving.  Notable findings thus far  Noted trop 1.9 Lactate 2  EKG NSR New onset hypoxia w/ O2 sats in 80s CXR pending  Elink notified Additional 500 cc bolus pending  -Pt evaluated at bedside. Predominant complaint is upper back pain.  -no abd pain  -no resp distress -non rebreather in place-O2 sats improving  Plan: -heparin gtt (discussed case w/ [facility] Cards PA- Cards not available at [facility] until 9am) -meets sepsis criteria based on HR and BP  -pressors pending by elink per nursing -start sepsis protocol  -discussed course of events w/ daytime attending Orvan Falconer who is en route  -discussed overall plan of care w/ pt/husband-Full Code

## 2014-12-19 NOTE — Progress Notes (Signed)
Called Dr Ernestina Patches about NS bolus complete with a blood pressure of 73/43; no new orders received at this time

## 2014-12-19 NOTE — Progress Notes (Addendum)
CRITICAL VALUE ALERT  Critical value received:  Trop 1.91  Date of notification:  12/19/2014  Time of notification:  0631  Critical value read back:Yes.    Nurse who received alert:  Wonda Cheng, RN  MD notified (1st page):  Romona Curls  Time of first page:  [phone]  MD notified (2nd page):  Time of second page:  Responding MD:  Romona Curls, RN  Time MD responded: [phone]

## 2014-12-19 NOTE — Progress Notes (Signed)
[facility] PROGRESS NOTE  Lindsay Mitchell:[id number] DOB: Feb 02, 1941    PCP:   Glo Herring., MD   HPI: Lindsay Mitchell is an 74 y.o. female admitted for diverticulitis, currently on IV Cipro and Flagyl, with surgery following, complained of chest pain last night, with normal EKG and negative troponin.  Last night, her BP dropped, and her troponin rose to 1.19.  She was given some IVF bolus, and started on dopamine drip.  This am, her BP was 77, and sat came up.  She had am nausea and vomiting and that's not new.  She was given IV Dilaudid yesterday.   Rewiew of Systems:  Constitutional: Negative for malaise, fever and chills. No significant weight loss or weight gain Eyes: Negative for eye pain, redness and discharge, diplopia, visual changes, or flashes of light. ENMT: Negative for ear pain, hoarseness, nasal congestion, sinus pressure and sore throat. No headaches; tinnitus, drooling, or problem swallowing. Cardiovascular: Negative for chest pain, palpitations, diaphoresis, dyspnea and peripheral edema. ; No orthopnea, PND Respiratory: Negative for cough, hemoptysis, wheezing and stridor. No pleuritic chestpain. Gastrointestinal: Negative for nausea, vomiting, diarrhea, constipation, abdominal pain, melena, blood in stool, hematemesis, jaundice and rectal bleeding.    Genitourinary: Negative for frequency, dysuria, incontinence,flank pain and hematuria; Musculoskeletal: Negative for back pain and neck pain. Negative for swelling and trauma.;  Skin: . Negative for pruritus, rash, abrasions, bruising and skin lesion.; ulcerations Neuro: Negative for headache, lightheadedness and neck stiffness. Negative for weakness, altered level of consciousness , altered mental status, extremity weakness, burning feet, involuntary movement, seizure and syncope.  Psych: negative for anxiety, depression, insomnia, tearfulness, panic attacks, hallucinations, paranoia, suicidal or homicidal  ideation    Past Medical History  Diagnosis Date  . Constipation   . Depression with anxiety   . Diverticulosis   . DVT (deep venous thrombosis)   . COPD (chronic obstructive pulmonary disease)     Emphysema radiographically  . Carotid artery occlusion   . Anxiety   . TIA (transient ischemic attack)   . Full dentures   . Pulmonary nodules 08/11/2013  . Migraine   . Fibromyalgia   . Stroke     Past Surgical History  Procedure Laterality Date  . Abdominal hysterectomy  1971  . Back surgery      X3  . Colonoscopy  June 2002    Dr. Ferdinand Lango: severe diverticular disease with haustral hypertrophy, primary and secondary diverticulosis, persistent spasticity, early stenosis  . Colonoscopy with propofol N/A 06/21/2013    Dr. Oneida Alar: sigmoid colon and descending colon with diverticula, small internal hemorrhoids  . Tonsillectomy    . Appendectomy    . Eye surgery      both cataracts-lenses  . Nasal sinus surgery Right 07/11/2013    Procedure: RIGHT ENDOSCOPIC ANTERIOR ETHMOIDECTOMY/RIGHT ENDOSCOPIC MAXILLARY ANTROSTOMY WITH REMOVAL OF TISSUE;  Surgeon: Ascencion Dike, MD;  Location: [facility];  Service: ENT;  Laterality: Right;  . Esophagogastroduodenoscopy N/A 03/06/2014    Procedure: ESOPHAGOGASTRODUODENOSCOPY (EGD);  Surgeon: Danie Binder, MD;  [facility]: [facility];  Service: Endoscopy;  Laterality: N/A;  9;30  . Savory dilation N/A 03/06/2014    Procedure: SAVORY DILATION;  Surgeon: Danie Binder, MD;  Location: [facility];  Service: Endoscopy;  Laterality: N/A;  . Esophageal biopsy  03/06/2014    Procedure: BIOPSY;  Surgeon: Danie Binder, MD;  Location: [facility];  Service: Endoscopy;;  . Other surgical history  scar tissue removal x2    Medications:  HOME MEDS: Prior to Admission medications   Medication Sig Start Date End Date Taking? Authorizing Provider  albuterol (PROVENTIL HFA;VENTOLIN HFA) 108 (90 BASE) MCG/ACT inhaler Inhale 2 puffs into  the lungs every 4 (four) hours as needed for wheezing or shortness of breath. 08/12/13  Yes Rexene Alberts, MD  aspirin EC 325 MG tablet Take 325 mg by mouth daily.   Yes Historical Provider, MD  ciprofloxacin (CIPRO) 500 MG tablet Take 500 mg by mouth 2 (two) times daily. 10 day course starting 12/13/2014 12/13/14  Yes Historical Provider, MD  gabapentin (NEURONTIN) 300 MG capsule Take 3 capsules (900 mg total) by mouth 3 (three) times daily. 11/09/14  Yes Melvenia Beam, MD  HYDROcodone-acetaminophen (NORCO) 10-325 MG per tablet Take 1 tablet by mouth every 6 (six) hours as needed for moderate pain or severe pain.   Yes Historical Provider, MD  ipratropium-albuterol (DUONEB) 0.5-2.5 (3) MG/3ML SOLN Take 3 mLs by nebulization 3 (three) times daily. 08/12/13  Yes Rexene Alberts, MD  LINZESS 145 MCG CAPS capsule TAKE 1 CAPSULE BY MOUTH EVERY DAY 30 MINUTES PRIOR TO THE FIRST MEAL OF THE DAY Patient taking differently: TAKE 1 CAPSULE BY MOUTH EVERY DAY 30 MINUTES PRIOR TO THE FIRST MEAL OF THE DAY AS NEEDED FOR CONSTIPATION 07/18/14  Yes Mahala Menghini, PA-C  metroNIDAZOLE (FLAGYL) 500 MG tablet Take 500 mg by mouth 2 (two) times daily. 10 day course starting on 12/13/2014 12/13/14  Yes Historical Provider, MD  methylPREDNISolone (MEDROL DOSEPAK) 4 MG tablet follow package directions Patient not taking: Reported on 11/23/2014 11/13/14   Melvenia Beam, MD  Oxcarbazepine (TRILEPTAL) 300 MG tablet Take 1 tablet (300 mg total) by mouth 2 (two) times daily. Patient not taking: Reported on 12/15/2014 11/28/14   Melvenia Beam, MD     Allergies:  Allergies  Allergen Reactions  . Codeine Other (See Comments)    Makes patient not in right state of mind.   . Oxycodone Itching  . Talwin [Pentazocine] Other (See Comments)    Makes patient not in right state of mind.   . Valium [Diazepam] Other (See Comments)    Just doesn't work.     Social History:   reports that she has been smoking Cigarettes.  She has a  50 pack-year smoking history. She does not have any smokeless tobacco history on file. She reports that she does not drink alcohol or use illicit drugs.  Family History: Family History  Problem Relation Age of Onset  . Colon cancer Neg Hx   . Breast cancer Mother   . Heart disease Mother   . Hypertension Mother   . Heart attack Mother   . Other Mother     varicose veins  . Cancer Mother   . Varicose Veins Mother   . Diabetes Brother   . Hypertension Brother   . Heart disease Brother   . Throat cancer Father   . Cancer Father      Physical Exam: Filed Vitals:   12/19/14 0630 12/19/14 0645 12/19/14 0747 12/19/14 0759  BP: 67/41 64/29    Pulse: 87 87    Temp:   97.9 F (36.6 C)   TempSrc:   Oral   Resp: 13 15    Height:      Weight:      SpO2: 91% 95%  93%   Blood pressure 64/29, pulse 87, temperature 97.9 F (36.6 C), temperature source Oral,  resp. rate 15, height 5\' 3"  (1.6 m), weight 54.6 kg (120 lb 5.9 oz), SpO2 93 %.  GEN:  Pleasant  patient lying in the stretcher in no acute distress; cooperative with exam. PSYCH:  alert and oriented x4; does not appear anxious or depressed; affect is appropriate. HEENT: Mucous membranes pink and anicteric; PERRLA; EOM intact; no cervical lymphadenopathy nor thyromegaly or carotid bruit; no JVD; There were no stridor. Neck is very supple. Breasts:: Not examined CHEST WALL: No tenderness CHEST: Normal respiration, clear to auscultation bilaterally.  HEART: Regular rate and rhythm.  There are no murmur, rub, or gallops.   BACK: No kyphosis or scoliosis; no CVA tenderness ABDOMEN: soft and non-tender; no masses, no organomegaly, normal abdominal bowel sounds; no pannus; no intertriginous candida. There is no rebound and no distention. Rectal Exam: Not done EXTREMITIES: No bone or joint deformity; age-appropriate arthropathy of the hands and knees; no edema; no ulcerations.  There is no calf tenderness. Genitalia: not examined PULSES:  2+ and symmetric SKIN: Normal hydration no rash or ulceration CNS: Cranial nerves 2-12 grossly intact no focal lateralizing neurologic deficit.  Speech is fluent; uvula elevated with phonation, facial symmetry and tongue midline. DTR are normal bilaterally, cerebella exam is intact, barbinski is negative and strengths are equaled bilaterally.  No sensory loss.   Labs on Admission:  Basic Metabolic Panel:  Recent Labs Lab 12/13/14 1204 12/15/14 1514 12/16/14 0605 12/19/14 0510  NA  --  140 139 141  K  --  4.0 3.6 3.4*  CL  --  101 103 106  CO2  --  30 31 29   GLUCOSE  --  95 125* 116*  BUN  --  19 12 5*  CREATININE 1.10 0.84 0.79 1.12*  CALCIUM  --  9.0 8.3* 7.9*   Liver Function Tests:  Recent Labs Lab 12/15/14 1514 12/19/14 0510  AST 17 24  ALT 7 7  ALKPHOS 78 62  BILITOT 0.5 0.6  PROT 6.5 4.8*  ALBUMIN 3.2* 2.4*    Recent Labs Lab 12/15/14 1514  LIPASE 31   No results for input(s): AMMONIA in the last 168 hours. CBC:  Recent Labs Lab 12/15/14 1514 12/16/14 0605 12/19/14 0510  WBC 8.0 6.0 9.5  NEUTROABS 5.1  --   --   HGB 13.2 11.2* 10.3*  HCT 41.9 35.3* 34.1*  MCV 96.8 98.9 100.0  PLT 240 189 188   Cardiac Enzymes:  Recent Labs Lab 12/18/14 0906 12/18/14 1752 12/19/14 0510  TROPONINI <0.03 <0.03 1.91*    CBG: No results for input(s): GLUCAP in the last 168 hours.   Radiological Exams on Admission: Dg Chest 2 View  12/18/2014   CLINICAL DATA:  Left-sided rib pain and cough.  Shortness of breath.  EXAM: CHEST  2 VIEW  COMPARISON:  12/13/2014 abdominal CT, 04/28/2014 radiograph  FINDINGS: Small left pleural effusion and associated airspace opacity. Background interstitial coarsening and hyperinflation. Aortic atherosclerosis. Normal heart size. Osteopenia and multilevel degenerative changes.  IMPRESSION: Small left pleural effusion and associated airspace opacity; atelectasis versus pneumonia. Recommend a repeat radiograph in 3-4 weeks to document  resolution.  Background COPD.   Electronically Signed   By: Carlos Levering M.D.   On: 12/18/2014 06:53   Dg Chest Port 1 View  12/19/2014   CLINICAL DATA:  Cough, shortness of breath.  EXAM: PORTABLE CHEST - 1 VIEW  COMPARISON:  12/18/2014  FINDINGS: Interstitial coarsening. Increased bibasilar opacities. There may be small layering pleural effusions. Aortic atherosclerosis. Enlarged  cardiac silhouette. Central vascular congestion. No pneumothorax. Osteopenia.  IMPRESSION: Small pleural effusions and increased bibasilar opacities which may reflect atelectasis, aspiration, or pneumonia.  Background COPD.  Recommend radiograph follow-up in 3-4 weeks to document resolution.   Electronically Signed   By: Carlos Levering M.D.   On: 12/19/2014 07:40    EKG: Independently reviewed.    Assessment/Plan Present on Admission:  . Colonic diverticular abscess . Protein-calorie malnutrition, severe . Depression with anxiety . Pleural effusion  PLAN:   Hypotension:  Unclear if pain meds had dropped her BP, or if ACS rather than type II MI.  Will continue with dopamine for BP support now.  Give IVF, and obtain ECHO.  Cardiology consulted.  Continue on IV Heparin.  EKG showed no acute changes.   Diverticulitis:  With her hypotension, will obtain abdominal pelvic CT with contrast to r/out perforation or bleeding.  Protein calorie malnutrition.  Other plans as per orders.  Code Status: FULL Haskel Khan, MD. [facility] Pager [phone] 7pm to 7am.  12/19/2014, 8:09 AM

## 2014-12-19 NOTE — Progress Notes (Signed)
  Echocardiogram 2D Echocardiogram has been performed.  Lindsay Mitchell 12/19/2014, 1:00 PM

## 2014-12-19 NOTE — Progress Notes (Signed)
Called Dr Ernestina Patches with critical trop and updated on patient condition; discussed Heparin drip, awaiting order; also asked to call e-link for further management; will continue to monitor and document any changes

## 2014-12-19 NOTE — Evaluation (Signed)
Received call about pt w/ BP in 60s.  Currently being treated for diverticular abscess -on cipro and flagyl  Afebrile No leukocytosis Asymptomatic per nursing BPs have been in 90s-100s.  4/18 CXR w/ effusion vs. Airspace disease in setting of COPD  -500cc NS bolus -reassess BP -SDU, lactate, imaging, cx  if BP still persistently low

## 2014-12-19 NOTE — Consult Note (Signed)
Cardiology Consultation Note  Patient ID: Lindsay Mitchell, MRN: [id number], DOB/AGE: [id number] y.o. Admit date: 12/15/2014   Date of Consult: 12/19/2014 Primary Physician: Glo Herring., MD Primary Cardiologist: New  Chief Complaint: chest pain Reason for Consultation: elevated troponin, hypotension, chest pain  HPI: Lindsay Mitchell is a 74 y/o F with history of diverticulosis, DVT (details unclear - listed in chart but patient denies history of), COPD (severe by imaging in 2014 with pulm nodules), ongoing tobacco abuse, chronic constipation, fibromyalgia, stroke, carotid disease (duplex 03/5101: RICA <58%, LICA 52-77%) who presented to [facility] on 12/15/14 with left-sided lower abdominal pain, nausea and vomiting. She was recently seen by her PCP who diagnosed her with diverticulitis and started her on oral Cipro/Flagyl. CT abdomen on 12/13/14 showed 2cm fluid collection adjacent to sigmoid colon in the left pelvis, possibly could represent fluid collection/abscess versus new small cystic structure in the left ovary. Due to worsening pain she presented to the ER and was admitted for pain control, IV abx, bowel rest and further evaluation. Gen surgery has not felt acute surgical intervention is needed at this time. Yesterday around 4am the patient began having difficulty breathing and sharp chest discomfort. It initially started under her left breast but gradually spread across her whole chest. She was treated with neb, Fentanyl and Mylanta. There was report of blood tinged sputum but the patient denies this. CXR - small left effusion with associated airspace opacity, possible atelectasis versus PNA. She has periodically been hypoxic down to the 80s requiring venturi mask. Overnight, she received IV Dilaudid for pain. SBP began falling as low as 59/37, maintaining 82U-23N systolic for most of the morning. She was started on dopamine drip with subsequent improvement to the 36R-44R systolic (rechecked both arms).  She has been given 3L NS bolus total since overnight. Chest pain has not ever fully gone away since yesterday. It remains worse with inspiration and slight movements in bed. She denies CP, orthopnea, palpitations, dizziness. Abdominal pain also persists. She says she's lost 14 lbs in just a few weeks due to poor appetite, limited oral intake, vomiting and abdominal pain. Prior to admission she had no CP.   Labs notable for troponin neg x 2 then next value was 1.91. Lactate normal. Normal WBC. Other labs notable for K 3.4, Cr mildly increased to 1.12, albumin 2.4. Blood cultures drawn and pending. Hgb 13.2 on admission -> 10.3. EKG does not show any impressive ST/T changes.   Of note she had an echo done 10/2014 by neuro showing EF 55-60%, grade 1 DD, elevated CVP, technically difficult study. She had been seen for trigeminal neuralgia and left monocular vision loss. She has also subsequently been seen by [facility] who referred the patient to opthamology to evaluate retina and get formal visual field testing. If opth suspects embolization he will consider left carotid angiogram. She has not yet had this appointment.  Past Medical History  Diagnosis Date  . Constipation   . Depression with anxiety   . Diverticulosis   . DVT (deep venous thrombosis)   . COPD (chronic obstructive pulmonary disease)     Emphysema radiographically  . Carotid artery disease     a. duplex 09/5398: RICA <86%, LICA 76-19%. Followed by [facility].  . Anxiety   . TIA (transient ischemic attack)   . Full dentures   . Pulmonary nodules 08/11/2013    a. CT angio 08/2013: 4-57mm nodules in RUL/RML, f/u recommended 6 months.  . Migraine   . Fibromyalgia   .  Stroke   . Protein calorie malnutrition       Most Recent Cardiac Studies: 2D Echo 11/07/14 - Left ventricle: The cavity size was normal. Wall thickness was normal. Systolic function was normal. The estimated ejectionfraction was in the range of 55% to 60%. Doppler parameters  areconsistent with abnormal left ventricular relaxation (grade 1 diastolic dysfunction). - Aortic valve: Mildly calcified annulus. Trileaflet; normalthickness leaflets. Valve area (VTI): 2.97 cm^2. Valve area(Vmax): 2.85 cm^2. - Inferior vena cava: The vessel was dilated. The respirophasicdiameter changes were blunted (< 50%), consistent with elevatedcentral venous pressure. - Technically difficult study.   Surgical History:  Past Surgical History  Procedure Laterality Date  . Abdominal hysterectomy  1971  . Back surgery      X3  . Colonoscopy  June 2002    Dr. Ferdinand Lango: severe diverticular disease with haustral hypertrophy, primary and secondary diverticulosis, persistent spasticity, early stenosis  . Colonoscopy with propofol N/A 06/21/2013    Dr. Oneida Alar: sigmoid colon and descending colon with diverticula, small internal hemorrhoids  . Tonsillectomy    . Appendectomy    . Eye surgery      both cataracts-lenses  . Nasal sinus surgery Right 07/11/2013    Procedure: RIGHT ENDOSCOPIC ANTERIOR ETHMOIDECTOMY/RIGHT ENDOSCOPIC MAXILLARY ANTROSTOMY WITH REMOVAL OF TISSUE;  Surgeon: Ascencion Dike, MD;  Location: [place];  Service: ENT;  Laterality: Right;  . Esophagogastroduodenoscopy N/A 03/06/2014    Procedure: ESOPHAGOGASTRODUODENOSCOPY (EGD);  Surgeon: Danie Binder, MD;  [facility];  Service: Endoscopy;  Laterality: N/A;  9;30  . Savory dilation N/A 03/06/2014    Procedure: SAVORY DILATION;  Surgeon: Danie Binder, MD;  [facility];  Service: Endoscopy;  Laterality: N/A;  . Esophageal biopsy  03/06/2014    Procedure: BIOPSY;  Surgeon: Danie Binder, MD;  [facility];  Service: Endoscopy;;  . Other surgical history      scar tissue removal x2     Home Meds: Prior to Admission medications   Medication Sig Start Date End Date Taking? Authorizing Provider  albuterol (PROVENTIL HFA;VENTOLIN HFA) 108 (90 BASE) MCG/ACT inhaler Inhale 2  puffs into the lungs every 4 (four) hours as needed for wheezing or shortness of breath. 08/12/13  Yes Rexene Alberts, MD  aspirin EC 325 MG tablet Take 325 mg by mouth daily.   Yes Historical Provider, MD  ciprofloxacin (CIPRO) 500 MG tablet Take 500 mg by mouth 2 (two) times daily. 10 day course starting 12/13/2014 12/13/14  Yes Historical Provider, MD  gabapentin (NEURONTIN) 300 MG capsule Take 3 capsules (900 mg total) by mouth 3 (three) times daily. 11/09/14  Yes Melvenia Beam, MD  HYDROcodone-acetaminophen (NORCO) 10-325 MG per tablet Take 1 tablet by mouth every 6 (six) hours as needed for moderate pain or severe pain.   Yes Historical Provider, MD  ipratropium-albuterol (DUONEB) 0.5-2.5 (3) MG/3ML SOLN Take 3 mLs by nebulization 3 (three) times daily. 08/12/13  Yes Rexene Alberts, MD  LINZESS 145 MCG CAPS capsule TAKE 1 CAPSULE BY MOUTH EVERY DAY 30 MINUTES PRIOR TO THE FIRST MEAL OF THE DAY Patient taking differently: TAKE 1 CAPSULE BY MOUTH EVERY DAY 30 MINUTES PRIOR TO THE FIRST MEAL OF THE DAY AS NEEDED FOR CONSTIPATION 07/18/14  Yes Mahala Menghini, PA-C  metroNIDAZOLE (FLAGYL) 500 MG tablet Take 500 mg by mouth 2 (two) times daily. 10 day course starting on 12/13/2014 12/13/14  Yes Historical Provider, MD  methylPREDNISolone (MEDROL DOSEPAK) 4 MG  tablet follow package directions Patient not taking: Reported on 11/23/2014 11/13/14   Melvenia Beam, MD  Oxcarbazepine (TRILEPTAL) 300 MG tablet Take 1 tablet (300 mg total) by mouth 2 (two) times daily. Patient not taking: Reported on 12/15/2014 11/28/14   Melvenia Beam, MD    Inpatient Medications:  . aspirin EC  325 mg Oral Daily  . enoxaparin (LOVENOX) injection  40 mg Subcutaneous Q24H  . feeding supplement (RESOURCE BREEZE)  1 Container Oral TID BM  . gabapentin  900 mg Oral TID  . ipratropium-albuterol  3 mL Nebulization Q4H  . Linaclotide  145 mcg Oral Daily  . piperacillin-tazobactam  3.375 g Intravenous Once  .  piperacillin-tazobactam (ZOSYN)  IV  3.375 g Intravenous Q8H  . vancomycin  1,250 mg Intravenous Once   . sodium chloride 50 mL/hr at 12/19/14 0612  . DOPamine 2.5 mcg/kg/min (12/19/14 2876)    Allergies:  Allergies  Allergen Reactions  . Codeine Other (See Comments)    Makes patient not in right state of mind.   . Oxycodone Itching  . Talwin [Pentazocine] Other (See Comments)    Makes patient not in right state of mind.   . Valium [Diazepam] Other (See Comments)    Just doesn't work.     History   Social History  . Marital Status: Married    Spouse Name: Jenny Reichmann  . Number of Children: 4  . Years of Education: 11th   Occupational History  . Retired     Social History Main Topics  . Smoking status: Current Some Day Smoker -- 1.00 packs/day for 50 years    Types: Cigarettes  . Smokeless tobacco: Not on file     Comment: smoked X 60 years  . Alcohol Use: No  . Drug Use: No  . Sexual Activity: Yes    Birth Control/ Protection: Surgical   Other Topics Concern  . Not on file   Social History Narrative   Lives at home with husband Jenny Reichmann.    Caffeine use: 3-4 cups coffee/day     Family History  Problem Relation Age of Onset  . Colon cancer Neg Hx   . Breast cancer Mother   . Heart disease Mother   . Hypertension Mother   . Heart attack Mother   . Other Mother     varicose veins  . Cancer Mother   . Varicose Veins Mother   . Diabetes Brother   . Hypertension Brother   . Heart disease Brother   . Throat cancer Father   . Cancer Father      Review of Systems: no syncope. No bleeding. All other systems reviewed and are otherwise negative except as noted above.  Labs:  Recent Labs  12/18/14 0906 12/18/14 1752 12/19/14 0510  TROPONINI <0.03 <0.03 1.91*   Lab Results  Component Value Date   WBC 9.5 12/19/2014   HGB 10.3* 12/19/2014   HCT 34.1* 12/19/2014   MCV 100.0 12/19/2014   PLT 188 12/19/2014    Recent Labs Lab 12/19/14 0510  NA 141  K 3.4*   CL 106  CO2 29  BUN 5*  CREATININE 1.12*  CALCIUM 7.9*  PROT 4.8*  BILITOT 0.6  ALKPHOS 62  ALT 7  AST 24  GLUCOSE 116*   Lab Results  Component Value Date   CHOL 163 04/12/2013   HDL 61 04/12/2013   LDLCALC 73 04/12/2013   TRIG 145 04/12/2013   Radiology/Studies:  Dg Chest 2 View  12/18/2014  CLINICAL DATA:  Left-sided rib pain and cough.  Shortness of breath.  EXAM: CHEST  2 VIEW  COMPARISON:  12/13/2014 abdominal CT, 04/28/2014 radiograph  FINDINGS: Small left pleural effusion and associated airspace opacity. Background interstitial coarsening and hyperinflation. Aortic atherosclerosis. Normal heart size. Osteopenia and multilevel degenerative changes.  IMPRESSION: Small left pleural effusion and associated airspace opacity; atelectasis versus pneumonia. Recommend a repeat radiograph in 3-4 weeks to document resolution.  Background COPD.   Electronically Signed   By: Carlos Levering M.D.   On: 12/18/2014 06:53   Ct Abdomen Pelvis W Contrast  12/13/2014   CLINICAL DATA:  Abdominal pain. Evaluate for diverticulitis or perforated bowel.  EXAM: CT ABDOMEN AND PELVIS WITH CONTRAST  TECHNIQUE: Multidetector CT imaging of the abdomen and pelvis was performed using the standard protocol following bolus administration of intravenous contrast.  CONTRAST:  150mL OMNIPAQUE IOHEXOL 300 MG/ML  SOLN  COMPARISON:  05/23/2013  FINDINGS: Lung bases are clear.  No effusions.  Heart is normal size.  Liver, gallbladder, spleen, pancreas and adrenals are unremarkable. Bilateral renal cysts, the largest in the left lower pole measuring up to 7 cm, similar to prior study. No hydronephrosis.  Aorta is heavily calcified, non aneurysmal.  Prior hysterectomy. Urinary bladder is decompressed, grossly unremarkable.  Extensive sigmoid diverticulosis. There is a small fluid collection adjacent to the sigmoid colon measuring 2 cm. It is unclear if this represents a small cyst within the left ovary for possible small  fluid collection adjacent to the sigmoid colon related to diverticulitis. This was not present in 2014.  Stomach and small bowel are unremarkable. No free fluid, free air or adenopathy.  No acute bony abnormality or focal bone lesion.  IMPRESSION: Sigmoid diverticulosis. 2 cm fluid collection adjacent to the sigmoid colon in the left pelvis. While this could reflect a small fluid collection/abscess related to diverticulitis, this also may represent a small cystic structure within the left ovary, but is new since 2014. I favor this is a small diverticular abscess. Consider treatment and repeat short-term imaging.  Bilateral renal cysts.   Electronically Signed   By: Rolm Baptise M.D.   On: 12/13/2014 12:34   Dg Chest Port 1 View  12/19/2014   CLINICAL DATA:  Cough, shortness of breath.  EXAM: PORTABLE CHEST - 1 VIEW  COMPARISON:  12/18/2014  FINDINGS: Interstitial coarsening. Increased bibasilar opacities. There may be small layering pleural effusions. Aortic atherosclerosis. Enlarged cardiac silhouette. Central vascular congestion. No pneumothorax. Osteopenia.  IMPRESSION: Small pleural effusions and increased bibasilar opacities which may reflect atelectasis, aspiration, or pneumonia.  Background COPD.  Recommend radiograph follow-up in 3-4 weeks to document resolution.   Electronically Signed   By: Carlos Levering M.D.   On: 12/19/2014 07:40    Wt Readings from Last 3 Encounters:  12/19/14 120 lb 5.9 oz (54.6 kg)  11/23/14 113 lb 9.6 oz (51.529 kg)  11/07/14 111 lb (50.349 kg)    EKG:  12/18/14: NSR 93bpm, incomplete LBBB (QRS 106), 1st degree AVB, nonspecific ST-T changes 12/19/14: NSR 87bpm, incomplete LBBB (QRS 102), low voltage QRS, nonspecific ST-T changes Overall not significantly changed from prior EKG except possibly lower voltage  Physical Exam: Blood pressure 94/46, pulse 87, temperature 97.9 F (36.6 C), temperature source Oral, resp. rate 15, height 5\' 3"  (1.6 m), weight 120 lb 5.9 oz  (54.6 kg), SpO2 93 %. General: Well developed thin WF in no acute distress. Lying flat in bed Head: Normocephalic, atraumatic, sclera non-icteric, no xanthomas, nares  are without discharge.  Neck: JVD not elevated.No carotid bruits.  Lungs: Diminished throughout without wheezes, rales or rhonchi. Generally poor air movement. Breathing is unlabored. Heart: RRR with S1 S2. No murmurs, rubs, or gallops appreciated. Abdomen: Soft, non-distended with normoactive bowel sounds. No hepatomegaly.  Msk:  Strength and tone appear normal for age. Extremities: No clubbing or cyanosis. No edema.  Distal pedal pulses are 2+ and equal bilaterally. Neuro: Alert and oriented X 3. No facial asymmetry. No focal deficit. Moves all extremities spontaneously. Psych:  Responds to questions appropriately with a normal affect.    Assessment and Plan:   1. Diverticulitis with colonic abscess - per IM/gen surg  2. Persistent hypotension - etiology unclear, improved with IV fluid recuscitation and low dose dopamine drip - note Hgb 13->10, repeat CT abdomen pending - WBC and lactate normal  - will d/w MD  3. Chest pain/elevated troponin - repeat stat troponin now and EKG now - addendum: f/u EKG reviewed with Dr. Harl Bowie who agrees non-acute - continue ASA - hold on BB due to hypotension and statin given ongoing GI process - chest pain is somewhat atypical but she does have multiple risk factors including tobacco abuse and carotid disease - may also need to exclude PE given pleuritic component - would await CT results before using IV heparin given hypotension, decreasing Hgb - echocardiogram pending (Dr. Harl Bowie will speak with echo tech to facilitate)  4. Acute kidney injury - I am concerned that Cr is on an upward trajectory given persistent hypotension overnight  5. COPD with acute hypoxia and ongoing tobacco abuse - supplemental O2 PRN - may need to be evaluated for O2 prior to dc given history of acute  hypoxia in the past as well - needs f/u for pulm nodules as seen in 2014  5. Hypokalemia - repletion per IM  Signed, Dayna Dunn PA-C 12/19/2014, 9:07 AM Pager: [phone]  Patient seen and discussed with PA Dunn, I agree with her documentation. 74 yo female history of COPD, fibromyalgia, CVA, prior DVT? admitted with abdominal pain and found to have diverticular abscess. Overnight patient began experiencing chest pain and SOB, blood pressures began to decrease with SBP in 60s. She was started on dopamine and given aggressive IVF with improvement of bp. She describes the suddent onset of 7/10 sharp pain throughout her chest starting last night. Worst with deep breaths and position, has been constant since onset last night.      Lactic acid 2, K 3.4, Cr 1.12, BUN 5, GFR 48, WBC 9.5, Hgb 10.3, Plt 188, trop 1.76 CXR small effusion, bibasilar opacities unclear etiology (atelectasis vs pneumonia).  CT A/P 12/13/14 extensive diverticulosis, diverticular abscess Repeat CT A/P 12/19/14 shows improved diverticulitis, resolution of abscess.  Echo 10/2014 LVEF 57-32%, grade I diastolic dysfunction EKG sinus tach no ischemic changes   Unclear etiology of her hypotension and chest pain. She does have elevated troponin however EKG without acute changes, unclear if ischemia may be due to global hypoperfusion and decreased coronary perfusion pressures due to her severe hypotension. Has had some chest pain but very atypical for cardiac, by history would be more suggestive of PE.  Echo will help differentiate potential acute LV (ischemia) or RV(PE)  pathology, we will have done this AM. Will start heparin gtt, continue to trend troponins. Has had downtrend in Hgb but suspect likely due to hemodilution, continue to follow cbc.   Continue bp support with dopamine, currently on fairly low dose at 4. Continue ASA.  Will f/u echo results with further management based on findings.    Zandra Abts MD

## 2014-12-19 NOTE — Progress Notes (Signed)
Pts BP 64/40 ,manual.Notified MD and follow orders as advised. Pt is stable at this time, will continue to monitor.

## 2014-12-19 NOTE — Progress Notes (Signed)
Cardiology is concerned about PE.   Recommended CTPA or V/Q.  She had contrast abdominal pelvic CT this am.  I spoke with Radiologist, Cr of 1.12, and he recommended to proceed with CTPA.  She is on IV heparin at this time for elevated troponin.

## 2014-12-19 NOTE — Progress Notes (Signed)
500 CC bolus of NS given, manual BP of 64/50 . MD notified, pt started on 1 Liter NS bolus and transferred to stepdown. Patient stable at time of transport.

## 2014-12-19 NOTE — Progress Notes (Signed)
Called [facility] and spoke with dr Deterding about low Bp and O2 sats; orders received; will continue to monitor and document any changes

## 2014-12-19 NOTE — Progress Notes (Signed)
Unclear etiology of hypotension. Echo with normal LV function. RV subtle changes in size and function, however CT PE is negative. IVC dilated suggests against hypovolemia. Normal WBC and no fever, no supportive evidence of sepsis/vasodilatory cause at this time. Follow troponin trend, suspect likely due to her hypotension, but will continue heparin for now. She is on fairly low dose dopamine, wean as tolerated. Troponin peak at 1.9, trending down.    Zandra Abts MD

## 2014-12-19 NOTE — Progress Notes (Signed)
Subjective: Events of last night noted. Patient states her left lower quadrant of her abdomen is still sore, but not worse than at the time of admission.  Objective: Vital signs in last 24 hours: Temp:  [97.9 F (36.6 C)-98.9 F (37.2 C)] 97.9 F (36.6 C) (04/19 0747) Pulse Rate:  [85-103] 87 (04/19 0645) Resp:  [13-19] 15 (04/19 0645) BP: (59-116)/(29-54) 64/29 mmHg (04/19 0645) SpO2:  [71 %-99 %] 93 % (04/19 0759) FiO2 (%):  [40 %] 40 % (04/19 0615) Weight:  [54.6 kg (120 lb 5.9 oz)] 54.6 kg (120 lb 5.9 oz) (04/19 0532) Last BM Date: 12/18/14  Intake/Output from previous day: 04/18 0701 - 04/19 0700 In: 2011.3 [P.O.:360; I.V.:1251.3; IV Piggyback:400] Out: 900 [Urine:900] Intake/Output this shift:    General appearance: cooperative and fatigued GI: Soft with mild tenderness noted to deep palpation in the left lower quadrant. No rigidity is noted. No particular distention is noted.  Lab Results:   Recent Labs  12/19/14 0510  WBC 9.5  HGB 10.3*  HCT 34.1*  PLT 188   BMET  Recent Labs  12/19/14 0510  NA 141  K 3.4*  CL 106  CO2 29  GLUCOSE 116*  BUN 5*  CREATININE 1.12*  CALCIUM 7.9*   PT/INR  Recent Labs  12/19/14 0735  LABPROT 17.6*  INR 1.43    Studies/Results: Dg Chest 2 View  12/18/2014   CLINICAL DATA:  Left-sided rib pain and cough.  Shortness of breath.  EXAM: CHEST  2 VIEW  COMPARISON:  12/13/2014 abdominal CT, 04/28/2014 radiograph  FINDINGS: Small left pleural effusion and associated airspace opacity. Background interstitial coarsening and hyperinflation. Aortic atherosclerosis. Normal heart size. Osteopenia and multilevel degenerative changes.  IMPRESSION: Small left pleural effusion and associated airspace opacity; atelectasis versus pneumonia. Recommend a repeat radiograph in 3-4 weeks to document resolution.  Background COPD.   Electronically Signed   By: Carlos Levering M.D.   On: 12/18/2014 06:53   Dg Chest Port 1 View  12/19/2014    CLINICAL DATA:  Cough, shortness of breath.  EXAM: PORTABLE CHEST - 1 VIEW  COMPARISON:  12/18/2014  FINDINGS: Interstitial coarsening. Increased bibasilar opacities. There may be small layering pleural effusions. Aortic atherosclerosis. Enlarged cardiac silhouette. Central vascular congestion. No pneumothorax. Osteopenia.  IMPRESSION: Small pleural effusions and increased bibasilar opacities which may reflect atelectasis, aspiration, or pneumonia.  Background COPD.  Recommend radiograph follow-up in 3-4 weeks to document resolution.   Electronically Signed   By: Carlos Levering M.D.   On: 12/19/2014 07:40    Anti-infectives: Anti-infectives    Start     Dose/Rate Route Frequency Ordered Stop   12/19/14 1600  piperacillin-tazobactam (ZOSYN) IVPB 3.375 g     3.375 g 12.5 mL/hr over 240 Minutes Intravenous Every 8 hours 12/19/14 0805     12/19/14 0900  vancomycin (VANCOCIN) 1,250 mg in sodium chloride 0.9 % 250 mL IVPB     1,250 mg 166.7 mL/hr over 90 Minutes Intravenous  Once 12/19/14 0804     12/19/14 0700  piperacillin-tazobactam (ZOSYN) IVPB 3.375 g     3.375 g 100 mL/hr over 30 Minutes Intravenous  Once 12/19/14 0659     12/16/14 0400  ciprofloxacin (CIPRO) IVPB 400 mg  Status:  Discontinued     400 mg 200 mL/hr over 60 Minutes Intravenous Every 12 hours 12/15/14 1837 12/19/14 0659   12/16/14 0100  metroNIDAZOLE (FLAGYL) IVPB 500 mg  Status:  Discontinued     500 mg 100 mL/hr  over 60 Minutes Intravenous Every 8 hours 12/15/14 1837 12/19/14 0659   12/15/14 1515  metroNIDAZOLE (FLAGYL) IVPB 500 mg     500 mg 100 mL/hr over 60 Minutes Intravenous  Once 12/15/14 1506 12/15/14 1733   12/15/14 1515  ciprofloxacin (CIPRO) IVPB 400 mg     400 mg 200 mL/hr over 60 Minutes Intravenous  Once 12/15/14 1506 12/15/14 1631      Assessment/Plan: Impression: Elevated troponin with hypoxia, being addressed by medicine. Sigmoid diverticulitis clinically appears stable with a normal white blood cell  count. May repeat CT scan the abdomen to assess any further progression of her diverticulitis.  LOS: 4 days    Katherleen Folkes A 12/19/2014

## 2014-12-19 NOTE — Progress Notes (Signed)
ANTICOAGULATION CONSULT NOTE - Initial Consult  Pharmacy Consult for Heparin Indication: chest pain/ACS  Allergies  Allergen Reactions  . Codeine Other (See Comments)    Makes patient not in right state of mind.   . Oxycodone Itching  . Talwin [Pentazocine] Other (See Comments)    Makes patient not in right state of mind.   . Valium [Diazepam] Other (See Comments)    Just doesn't work.    Patient Measurements: Height: 5\' 3"  (160 cm) Weight: 120 lb 5.9 oz (54.6 kg) IBW/kg (Calculated) : 52.4  Vital Signs: Temp: 99.8 F (37.7 C) (04/19 1139) Temp Source: Oral (04/19 1139) BP: 64/29 mmHg (04/19 0645) Pulse Rate: 87 (04/19 0645)  Labs:  Recent Labs  12/18/14 1752 12/19/14 0510 12/19/14 0735 12/19/14 0911  HGB  --  10.3*  --   --   HCT  --  34.1*  --   --   PLT  --  188  --   --   APTT  --   --  46*  --   LABPROT  --   --  17.6*  --   INR  --   --  1.43  --   CREATININE  --  1.12*  --   --   TROPONINI <0.03 1.91*  --  1.76*    Estimated Creatinine Clearance: 38.1 mL/min (by C-G formula based on Cr of 1.12).  Medical History: Past Medical History  Diagnosis Date  . Constipation   . Depression with anxiety   . Diverticulosis   . DVT (deep venous thrombosis)   . COPD (chronic obstructive pulmonary disease)     Emphysema radiographically  . Carotid artery disease     a. duplex 12/979: RICA <19%, LICA 14-78%. Followed by [facility].  . Anxiety   . TIA (transient ischemic attack)   . Full dentures   . Pulmonary nodules 08/11/2013    a. CT angio 08/2013: 4-49mm nodules in RUL/RML, f/u recommended 6 months.  . Migraine   . Fibromyalgia   . Stroke   . Protein calorie malnutrition   . First degree AV block    Medications:  Prescriptions prior to admission  Medication Sig Dispense Refill Last Dose  . albuterol (PROVENTIL HFA;VENTOLIN HFA) 108 (90 BASE) MCG/ACT inhaler Inhale 2 puffs into the lungs every 4 (four) hours as needed for wheezing or shortness of breath. 1  Inhaler 12 unknown  . aspirin EC 325 MG tablet Take 325 mg by mouth daily.   12/14/2014 at Unknown time  . ciprofloxacin (CIPRO) 500 MG tablet Take 500 mg by mouth 2 (two) times daily. 10 day course starting 12/13/2014  0 12/15/2014 at Unknown time  . gabapentin (NEURONTIN) 300 MG capsule Take 3 capsules (900 mg total) by mouth 3 (three) times daily. 270 capsule 6 12/14/2014 at Unknown time  . HYDROcodone-acetaminophen (NORCO) 10-325 MG per tablet Take 1 tablet by mouth every 6 (six) hours as needed for moderate pain or severe pain.   12/14/2014 at Unknown time  . ipratropium-albuterol (DUONEB) 0.5-2.5 (3) MG/3ML SOLN Take 3 mLs by nebulization 3 (three) times daily.   unknown  . LINZESS 145 MCG CAPS capsule TAKE 1 CAPSULE BY MOUTH EVERY DAY 30 MINUTES PRIOR TO THE FIRST MEAL OF THE DAY (Patient taking differently: TAKE 1 CAPSULE BY MOUTH EVERY DAY 30 MINUTES PRIOR TO THE FIRST MEAL OF THE DAY AS NEEDED FOR CONSTIPATION) 30 capsule 11 12/14/2014 at Unknown time  . metroNIDAZOLE (FLAGYL) 500 MG tablet Take 500  mg by mouth 2 (two) times daily. 10 day course starting on 12/13/2014  0 12/15/2014 at Unknown time  . methylPREDNISolone (MEDROL DOSEPAK) 4 MG tablet follow package directions (Patient not taking: Reported on 11/23/2014) 21 tablet 1 Not Taking  . Oxcarbazepine (TRILEPTAL) 300 MG tablet Take 1 tablet (300 mg total) by mouth 2 (two) times daily. (Patient not taking: Reported on 12/15/2014) 60 tablet 6     Assessment: 74yo female was being treated for diverticular abscess.  Pt c/o CP and SOB.  Cardiology consulted. Troponins elevated.  Pt being treated for sepsis with hydration and ABX.  Pt has low BP.  Plan to evaluate for possible PE. Baseline aPTT and Protime elevated.  Goal of Therapy:  Heparin level 0.3-0.7 units/ml w/in 24 hours of initiation Monitor platelets by anticoagulation protocol: Yes   Plan:   Heparin 2000 units IV now x 1 (due to elevated aPTT/ PT & small size)  Heparin infusion at  650 units/hr  Check heparin level in 6 hrs and daily  Check CBC daily while on Heparin  Hart Robinsons A 12/19/2014,12:24 PM

## 2014-12-19 NOTE — Progress Notes (Signed)
[facility] Progress Note Patient Name: Lindsay Mitchell DOB: 10/09/1940 MRN: [id number]   Date of Service  12/19/2014  HPI/Events of Note  Hypotension in the setting of positive trop.  Started on heparin gtt.  Current BP of 67/41 (47)  eICU Interventions  1. Start heparin - plan for cardiology consult 2. 500 cc NS fluid bolus 3. Oxygen therapy for O2 sats in the 80s - check ABG 4. Start DA gtt for BP support     Intervention Category Intermediate Interventions: Hypotension - evaluation and management Minor Interventions: Clinical assessment - ordering diagnostic tests Evaluation Type: Other  Jerrell Hart 12/19/2014, 6:36 AM

## 2014-12-20 ENCOUNTER — Encounter (HOSPITAL_COMMUNITY): Payer: Self-pay | Admitting: General Surgery

## 2014-12-20 DIAGNOSIS — K5720 Diverticulitis of large intestine with perforation and abscess without bleeding: Secondary | ICD-10-CM | POA: Diagnosis present

## 2014-12-20 DIAGNOSIS — R579 Shock, unspecified: Secondary | ICD-10-CM | POA: Diagnosis present

## 2014-12-20 DIAGNOSIS — R7989 Other specified abnormal findings of blood chemistry: Secondary | ICD-10-CM

## 2014-12-20 DIAGNOSIS — J449 Chronic obstructive pulmonary disease, unspecified: Secondary | ICD-10-CM

## 2014-12-20 DIAGNOSIS — I214 Non-ST elevation (NSTEMI) myocardial infarction: Secondary | ICD-10-CM | POA: Diagnosis present

## 2014-12-20 DIAGNOSIS — I959 Hypotension, unspecified: Secondary | ICD-10-CM | POA: Diagnosis present

## 2014-12-20 LAB — CBC
HCT: 36 % (ref 36.0–46.0)
HCT: 33.6 % — ABNORMAL LOW (ref 36.0–46.0)
Hemoglobin: 10.9 g/dL — ABNORMAL LOW (ref 12.0–15.0)
Hemoglobin: 10.6 g/dL — ABNORMAL LOW (ref 12.0–15.0)
MCH: 29.9 pg (ref 26.0–34.0)
MCH: 30.5 pg (ref 26.0–34.0)
MCHC: 30.3 g/dL (ref 30.0–36.0)
MCHC: 31.5 g/dL (ref 30.0–36.0)
MCV: 98.6 fL (ref 78.0–100.0)
MCV: 96.8 fL (ref 78.0–100.0)
Platelets: 222 10*3/uL (ref 150–400)
Platelets: 188 10*3/uL (ref 150–400)
RBC: 3.65 MIL/uL — ABNORMAL LOW (ref 3.87–5.11)
RBC: 3.47 MIL/uL — ABNORMAL LOW (ref 3.87–5.11)
RDW: 13.1 % (ref 11.5–15.5)
RDW: 13.3 % (ref 11.5–15.5)
WBC: 10 10*3/uL (ref 4.0–10.5)
WBC: 8 10*3/uL (ref 4.0–10.5)

## 2014-12-20 LAB — BASIC METABOLIC PANEL
Anion gap: 7 (ref 5–15)
Anion gap: 6 (ref 5–15)
BUN: 6 mg/dL (ref 6–23)
BUN: <5 mg/dL — ABNORMAL LOW (ref 6–23)
CO2: 29 mmol/L (ref 19–32)
CO2: 28 mmol/L (ref 19–32)
Calcium: 8.3 mg/dL — ABNORMAL LOW (ref 8.4–10.5)
Calcium: 7.5 mg/dL — ABNORMAL LOW (ref 8.4–10.5)
Chloride: 106 mmol/L (ref 96–112)
Chloride: 104 mmol/L (ref 96–112)
Creatinine, Ser: 0.8 mg/dL (ref 0.50–1.10)
Creatinine, Ser: 0.74 mg/dL (ref 0.50–1.10)
GFR calc Af Amer: 84 mL/min — ABNORMAL LOW (ref >90)
GFR calc Af Amer: >90 mL/min (ref >90)
GFR calc non Af Amer: 72 mL/min — ABNORMAL LOW (ref >90)
GFR calc non Af Amer: 84 mL/min — ABNORMAL LOW (ref >90)
Glucose, Bld: 108 mg/dL — ABNORMAL HIGH (ref 70–99)
Glucose, Bld: 135 mg/dL — ABNORMAL HIGH (ref 70–99)
Potassium: 3.1 mmol/L — ABNORMAL LOW (ref 3.5–5.1)
Potassium: 3.2 mmol/L — ABNORMAL LOW (ref 3.5–5.1)
Sodium: 142 mmol/L (ref 135–145)
Sodium: 138 mmol/L (ref 135–145)

## 2014-12-20 LAB — TROPONIN I
Troponin I: 1.66 ng/mL (ref <0.031)
Troponin I: 0.78 ng/mL (ref <0.031)

## 2014-12-20 LAB — RETICULOCYTES
RBC.: 3.41 MIL/uL — ABNORMAL LOW (ref 3.87–5.11)
Retic Count, Absolute: 51.2 10*3/uL (ref 19.0–186.0)
Retic Ct Pct: 1.5 % (ref 0.4–3.1)

## 2014-12-20 LAB — LACTIC ACID, PLASMA: Lactic Acid, Venous: 0.9 mmol/L (ref 0.5–2.0)

## 2014-12-20 LAB — MAGNESIUM
Magnesium: 1.8 mg/dL (ref 1.5–2.5)
Magnesium: 10.4 mg/dL (ref 1.5–2.5)
Magnesium: 2.6 mg/dL — ABNORMAL HIGH (ref 1.5–2.5)

## 2014-12-20 LAB — HEPARIN LEVEL (UNFRACTIONATED)
Heparin Unfractionated: 0.13 IU/mL — ABNORMAL LOW (ref 0.30–0.70)
Heparin Unfractionated: 0.52 IU/mL (ref 0.30–0.70)

## 2014-12-20 LAB — PHOSPHORUS: Phosphorus: 1.6 mg/dL — ABNORMAL LOW (ref 2.3–4.6)

## 2014-12-20 LAB — PROCALCITONIN: Procalcitonin: 0.27 ng/mL

## 2014-12-20 MED ORDER — HEPARIN (PORCINE) IN NACL 100-0.45 UNIT/ML-% IJ SOLN
850.0000 [IU]/h | INTRAMUSCULAR | Status: DC
  Administered 2014-12-20: 850 [IU]/h via INTRAVENOUS
  Filled 2014-12-20: qty 250

## 2014-12-20 MED ORDER — SODIUM CHLORIDE 0.9 % IJ SOLN
10.0000 mL | Freq: Two times a day (BID) | INTRAMUSCULAR | Status: DC
  Administered 2014-12-20: 40 mL
  Administered 2014-12-21: 10 mL

## 2014-12-20 MED ORDER — ASPIRIN EC 81 MG PO TBEC
81.0000 mg | DELAYED_RELEASE_TABLET | Freq: Every day | ORAL | Status: DC
  Administered 2014-12-20 – 2014-12-21 (×2): 81 mg via ORAL
  Filled 2014-12-20 (×2): qty 1

## 2014-12-20 MED ORDER — MORPHINE SULFATE 2 MG/ML IJ SOLN
INTRAMUSCULAR | Status: AC
  Filled 2014-12-20: qty 1

## 2014-12-20 MED ORDER — MORPHINE SULFATE 2 MG/ML IJ SOLN
1.0000 mg | INTRAMUSCULAR | Status: AC
  Administered 2014-12-20: 1 mg via INTRAVENOUS

## 2014-12-20 MED ORDER — NITROGLYCERIN 0.4 MG SL SUBL
SUBLINGUAL_TABLET | SUBLINGUAL | Status: AC
  Administered 2014-12-20: 0.4 mg
  Filled 2014-12-20: qty 1

## 2014-12-20 MED ORDER — MORPHINE SULFATE 2 MG/ML IJ SOLN: 1.0000 mg | INTRAMUSCULAR | Status: AC

## 2014-12-20 MED ORDER — SODIUM CHLORIDE 0.9 % IV SOLN
1.0000 mL/kg/h | INTRAVENOUS | Status: DC
  Administered 2014-12-20: 1 mL/kg/h via INTRAVENOUS

## 2014-12-20 MED ORDER — SODIUM CHLORIDE 0.9 % IJ SOLN: 10.0000 mL | INTRAMUSCULAR | Status: DC | PRN

## 2014-12-20 MED ORDER — IPRATROPIUM-ALBUTEROL 0.5-2.5 (3) MG/3ML IN SOLN
3.0000 mL | Freq: Four times a day (QID) | RESPIRATORY_TRACT | Status: DC
  Administered 2014-12-21 – 2014-12-22 (×7): 3 mL via RESPIRATORY_TRACT
  Filled 2014-12-20 (×8): qty 3

## 2014-12-20 MED ORDER — MAGNESIUM SULFATE 2 GM/50ML IV SOLN
2.0000 g | Freq: Once | INTRAVENOUS | Status: AC
Start: 2014-12-20 — End: 2014-12-20
  Administered 2014-12-20: 2 g via INTRAVENOUS
  Filled 2014-12-20: qty 50

## 2014-12-20 MED ORDER — NITROGLYCERIN 2 % TD OINT
0.5000 [in_us] | TOPICAL_OINTMENT | Freq: Four times a day (QID) | TRANSDERMAL | Status: DC
  Administered 2014-12-20 – 2014-12-23 (×7): 0.5 [in_us] via TOPICAL
  Filled 2014-12-20: qty 30

## 2014-12-20 MED ORDER — ATORVASTATIN CALCIUM 80 MG PO TABS
80.0000 mg | ORAL_TABLET | Freq: Every day | ORAL | Status: DC
  Administered 2014-12-20 – 2014-12-27 (×8): 80 mg via ORAL
  Filled 2014-12-20 (×5): qty 1
  Filled 2014-12-20: qty 2
  Filled 2014-12-20 (×3): qty 1

## 2014-12-20 MED ORDER — PHENYLEPHRINE HCL 10 MG/ML IJ SOLN
30.0000 ug/min | INTRAVENOUS | Status: DC
  Administered 2014-12-20: 30 ug/min via INTRAVENOUS
  Administered 2014-12-20: 35 ug/min via INTRAVENOUS
  Filled 2014-12-20 (×2): qty 1

## 2014-12-20 MED ORDER — IPRATROPIUM-ALBUTEROL 0.5-2.5 (3) MG/3ML IN SOLN: 3.0000 mL | Freq: Three times a day (TID) | RESPIRATORY_TRACT | Status: DC

## 2014-12-20 MED ORDER — ENOXAPARIN SODIUM 40 MG/0.4ML ~~LOC~~ SOLN
40.0000 mg | Freq: Every day | SUBCUTANEOUS | Status: DC
  Administered 2014-12-20: 40 mg via SUBCUTANEOUS
  Filled 2014-12-20: qty 0.4

## 2014-12-20 MED ORDER — POTASSIUM CHLORIDE CRYS ER 20 MEQ PO TBCR
40.0000 meq | EXTENDED_RELEASE_TABLET | Freq: Once | ORAL | Status: AC
  Administered 2014-12-20: 40 meq via ORAL
  Filled 2014-12-20: qty 2

## 2014-12-20 NOTE — Progress Notes (Addendum)
[facility]                                                                              Patient Demographics  Lindsay Mitchell, is a 74 y.o. female, DOB - May 14, 1941, WIO:[id number]  Admit date - 12/15/2014   Admitting Physician Truett Mainland, DO  Outpatient Primary MD for the patient is Glo Herring., MD  LOS - 5   Chief Complaint  Patient presents with  . Diverticulitis      Interim history Patient admitted for diverticulitis on 12/15/2014 and started on IV cipro/flagyl which was changed to Vancomycin and zosyn, as she was being treated for suspected diverticular abscess.  General surgery consulted.  Patient remained afebrile with no leukocytosis. Repeat CT abd showed improved of diverticulitis.  Patient developed chest pain, and her troponins were followed. Troponin did peak at 1.9. Patient was started on heparin drip. Patient also became hypotensive. Cardiology was consulted, patient was started on a dopamine drip per cardiology. CT chest was negative for PE, echocardiogram showed normal left ventricular function 60-65%.  Cardiology requested patient be transferred to [facility] ICU, and patient will need heart catheterization.    Assessment & Plan   Hypotension -Unknown etiology -Echocardiogram showed normal LV function, CT chest PE study negative. -Currently on dopamine, low-dose. -Unlikely related to sepsis as patient is currently afebrile, no leukocytosis, lactic acid normal -Cortisol level pending -Spoke with Dr. Halford Chessman (PCCM) regarding transfer to [facility] ICU -Patient may benefit from CVP monitoring as well as central line placement for pressors  Elevated troponin/ atypical chest pain -Peak troponin 1.9 -Cardiology consulted and appreciated -Patient currently chest pain-free -Troponin possibly elevated secondary to hypotension versus coronary artery disease -Patient placed on heparin drip -EKG shows no ischemic changes -Echocardiogram shows normal LV  function -Continue aspirin, statin -No ACE inhibitor or beta blocker due to hypotension -Patient will need heart catheterization, which will be arranged for by cardiology  Acute diverticulitis with colonic abscess -Patient continues to be on vancomycin and Zosyn -Does not appear to have had leukocytosis will be febrile during this admission -Lactic acid normal -Repeat CT of the abdomen on 12/19/2014 shows improvement of diverticulitis -Gen. surgery consulted and appreciated, no surgical intervention at this time  Hypokalemia -Will replace and continue to monitor BMP -Check magnesium  Protein Calorie Malnutrition  -Will consult nutrition  Normocytic Anemia -Drop in Hb likely dilutional -Check anemia panel and occult.  Code Status: Full  Family Communication: Husband at bedside  Disposition Plan: Admitted.  Will transfer to ICU at [facility]  Time Spent in minutes   45 minutes  Procedures  Echocardiogram on 12/19/2014: Normal LV systolic function at 38-45%. RV poorly visualized in prior 11/07/14 study, however does appear to be somewhat bigger with mildly decreased function compared to prior study (RV TAPSE decrased from 1.8 to 1.5).   Consults   Cardiology General surgery PCCM  DVT Prophylaxis  Heparin  Lab Results  Component Value Date   PLT 222 12/20/2014    Medications  Scheduled Meds: . aspirin EC  81 mg Oral Daily  . atorvastatin  80 mg Oral q1800  . feeding supplement (RESOURCE BREEZE)  1 Container Oral TID  BM  . gabapentin  900 mg Oral TID  . ipratropium-albuterol  3 mL Nebulization Q4H  . Linaclotide  145 mcg Oral Daily  . piperacillin-tazobactam (ZOSYN)  IV  3.375 g Intravenous Q8H  . vancomycin  500 mg Intravenous Q12H   Continuous Infusions: . sodium chloride 50 mL/hr at 12/20/14 0600  . DOPamine 5 mcg/kg/min (12/20/14 0900)  . heparin     PRN Meds:.acetaminophen **OR** acetaminophen, albuterol, alum & mag hydroxide-simeth,  HYDROcodone-acetaminophen, HYDROmorphone (DILAUDID) injection, ondansetron **OR** ondansetron (ZOFRAN) IV  Antibiotics    Anti-infectives    Start     Dose/Rate Route Frequency Ordered Stop   12/19/14 2100  vancomycin (VANCOCIN) 500 mg in sodium chloride 0.9 % 100 mL IVPB     500 mg 100 mL/hr over 60 Minutes Intravenous Every 12 hours 12/19/14 0934     12/19/14 1600  piperacillin-tazobactam (ZOSYN) IVPB 3.375 g  Status:  Discontinued     3.375 g 12.5 mL/hr over 240 Minutes Intravenous Every 8 hours 12/19/14 0805 12/19/14 0923   12/19/14 1000  piperacillin-tazobactam (ZOSYN) IVPB 3.375 g     3.375 g 12.5 mL/hr over 240 Minutes Intravenous Every 8 hours 12/19/14 0923     12/19/14 0900  vancomycin (VANCOCIN) 1,250 mg in sodium chloride 0.9 % 250 mL IVPB     1,250 mg 166.7 mL/hr over 90 Minutes Intravenous  Once 12/19/14 0804 12/19/14 1029   12/19/14 0700  piperacillin-tazobactam (ZOSYN) IVPB 3.375 g  Status:  Discontinued     3.375 g 100 mL/hr over 30 Minutes Intravenous  Once 12/19/14 0659 12/19/14 0924   12/16/14 0400  ciprofloxacin (CIPRO) IVPB 400 mg  Status:  Discontinued     400 mg 200 mL/hr over 60 Minutes Intravenous Every 12 hours 12/15/14 1837 12/19/14 0659   12/16/14 0100  metroNIDAZOLE (FLAGYL) IVPB 500 mg  Status:  Discontinued     500 mg 100 mL/hr over 60 Minutes Intravenous Every 8 hours 12/15/14 1837 12/19/14 0659   12/15/14 1515  metroNIDAZOLE (FLAGYL) IVPB 500 mg     500 mg 100 mL/hr over 60 Minutes Intravenous  Once 12/15/14 1506 12/15/14 1733   12/15/14 1515  ciprofloxacin (CIPRO) IVPB 400 mg     400 mg 200 mL/hr over 60 Minutes Intravenous  Once 12/15/14 1506 12/15/14 1631        Subjective:   Jovita Gamma seen and examined today.  Patient states she is hungry. Denies any abdominal pain at this time. Patient states that she would like to have a fried eggs, has had this repeatedly. Patient denies any current chest pain or shortness of breath. Inquires about  possibly going home. Patient denies any dizziness, headache at this time.  Objective:   Filed Vitals:   12/20/14 1015 12/20/14 1023 12/20/14 1030 12/20/14 1045  BP: 106/64 93/45 114/85 119/62  Pulse: 80 77 86 91  Temp:      TempSrc:      Resp: 12 12 17    Height:      Weight:      SpO2: 97% 96% 88% 97%    Wt Readings from Last 3 Encounters:  12/19/14 54.6 kg (120 lb 5.9 oz)  11/23/14 51.529 kg (113 lb 9.6 oz)  11/07/14 50.349 kg (111 lb)     Intake/Output Summary (Last 24 hours) at 12/20/14 1047 Last data filed at 12/20/14 0900  Gross per 24 hour  Intake 1965.17 ml  Output    950 ml  Net 1015.17 ml    Exam  General: Well developed, well nourished, NAD, appears stated age  HEENT: NCAT, PERRLA, EOMI, Anicteic Sclera, mucous membranes moist.   Neck: Supple, no JVD, no masses  Cardiovascular: S1 S2 auscultated, no rubs, murmurs or gallops. Regular rate and rhythm.  Respiratory: Clear to auscultation bilaterally with equal chest rise  Abdomen: Soft, nontender, nondistended, + bowel sounds  Extremities: warm dry without cyanosis clubbing or edema  Neuro: AAOx3, cranial nerves grossly intact. No focal deficits  Psych: Normal affect and demeanor with intact judgement and insight  Data Review   Micro Results Recent Results (from the past 240 hour(s))  MRSA PCR Screening     Status: None   Collection Time: 12/19/14  5:35 AM  Result Value Ref Range Status   MRSA by PCR NEGATIVE NEGATIVE Final    Comment:        The GeneXpert MRSA Assay (FDA approved for NASAL specimens only), is one component of a comprehensive MRSA colonization surveillance program. It is not intended to diagnose MRSA infection nor to guide or monitor treatment for MRSA infections.   Culture, blood (x 2)     Status: None (Preliminary result)   Collection Time: 12/19/14  7:35 AM  Result Value Ref Range Status   Specimen Description BLOOD RIGHT WRIST  Final   Special Requests BOTTLES DRAWN  AEROBIC AND ANAEROBIC 8CC  Final   Culture NO GROWTH 1 DAY  Final   Report Status PENDING  Incomplete  Culture, blood (x 2)     Status: None (Preliminary result)   Collection Time: 12/19/14  7:40 AM  Result Value Ref Range Status   Specimen Description BLOOD LEFT HAND  Final   Special Requests   Final    BOTTLES DRAWN AEROBIC AND ANAEROBIC AEB=8CC ANA=6CC   Culture NO GROWTH 1 DAY  Final   Report Status PENDING  Incomplete    Radiology Reports Dg Chest 2 View  12/18/2014   CLINICAL DATA:  Left-sided rib pain and cough.  Shortness of breath.  EXAM: CHEST  2 VIEW  COMPARISON:  12/13/2014 abdominal CT, 04/28/2014 radiograph  FINDINGS: Small left pleural effusion and associated airspace opacity. Background interstitial coarsening and hyperinflation. Aortic atherosclerosis. Normal heart size. Osteopenia and multilevel degenerative changes.  IMPRESSION: Small left pleural effusion and associated airspace opacity; atelectasis versus pneumonia. Recommend a repeat radiograph in 3-4 weeks to document resolution.  Background COPD.   Electronically Signed   By: Carlos Levering M.D.   On: 12/18/2014 06:53   Ct Angio Chest Pe W/cm &/or Wo Cm  12/19/2014   CLINICAL DATA:  Shortness of breath. Recent surgery for diverticulitis. Chest pain started last night.  EXAM: CT ANGIOGRAPHY CHEST WITH CONTRAST  TECHNIQUE: Multidetector CT imaging of the chest was performed using the standard protocol during bolus administration of intravenous contrast. Multiplanar CT image reconstructions and MIPs were obtained to evaluate the vascular anatomy.  CONTRAST:  44mL OMNIPAQUE IOHEXOL 350 MG/ML SOLN  COMPARISON:  None.  FINDINGS: There is adequate opacification of the pulmonary arteries. There is no pulmonary embolus. The main pulmonary artery, right main pulmonary artery and left main pulmonary arteries are normal in size. The heart size is mildly enlarged. There is no pericardial effusion. There is coronary artery  atherosclerosis in the LAD, circumflex and RCA.  There are bilateral small pleural effusions. There is bilateral compressive atelectasis, left greater than right. There is bilateral centrilobular emphysema. There is no pneumothorax.  There is no axillary, hilar, or mediastinal adenopathy.  There is no lytic  or blastic osseous lesion.  The visualized portions of the upper abdomen are unremarkable.  Review of the MIP images confirms the above findings.  IMPRESSION: 1. No evidence pulmonary embolus. 2. Bilateral small pleural effusions and compressive atelectasis. 3. Bilateral centrilobular emphysema.   Electronically Signed   By: Kathreen Devoid   On: 12/19/2014 16:17   Ct Abdomen Pelvis W Contrast  12/19/2014   CLINICAL DATA:  Follow-up diverticular abscess.  Hypotension.  EXAM: CT ABDOMEN AND PELVIS WITH CONTRAST  TECHNIQUE: Multidetector CT imaging of the abdomen and pelvis was performed using the standard protocol following bolus administration of intravenous contrast.  CONTRAST:  140mL OMNIPAQUE IOHEXOL 300 MG/ML  SOLN  COMPARISON:  CT abdomen pelvis 12/13/2011  FINDINGS: Interval resolution of rim enhancing fluid collection posterior to the sigmoid colon compatible with resolution of abscess. Sigmoid diverticular change noted in this area with some thickening of the bowel which also has improved. Findings are consistent with resolving diverticulitis and abscess. No abscess or free fluid on today's study. Negative for bowel obstruction.  Small bilateral pleural effusions with bibasilar atelectasis have developed since the prior study  Focal fatty infiltration of the left lobe of the liver adjacent to the falciform ligament unchanged. Pericholecystic fluid has developed since the prior study however the gallbladder wall is not thickened and no gallstones identified. Correlate with right upper quadrant pain and ultrasound if clinically indicated. Spleen is normal. Pancreas is atrophic without edema or mass   Negative for renal obstruction or mass. Multiple renal cysts. Largest cyst left lower pole measuring 7 cm  Atherosclerotic aorta without aneurysm. Prior hysterectomy. No adenopathy.  Moderate disc degeneration and spurring in the lower lumbar spine. Negative for fracture  IMPRESSION: Improvement in diverticulitis. Resolution of diverticular abscess in the sigmoid region.  Interval development of pericholecystic fluid. No gallbladder thickening or gallstones. Correlate with pain in this area and possible ultrasound if indicated  Interval development of bilateral pleural effusions and bibasilar atelectasis.   Electronically Signed   By: Franchot Gallo M.D.   On: 12/19/2014 11:28   Ct Abdomen Pelvis W Contrast  12/13/2014   CLINICAL DATA:  Abdominal pain. Evaluate for diverticulitis or perforated bowel.  EXAM: CT ABDOMEN AND PELVIS WITH CONTRAST  TECHNIQUE: Multidetector CT imaging of the abdomen and pelvis was performed using the standard protocol following bolus administration of intravenous contrast.  CONTRAST:  173mL OMNIPAQUE IOHEXOL 300 MG/ML  SOLN  COMPARISON:  05/23/2013  FINDINGS: Lung bases are clear.  No effusions.  Heart is normal size.  Liver, gallbladder, spleen, pancreas and adrenals are unremarkable. Bilateral renal cysts, the largest in the left lower pole measuring up to 7 cm, similar to prior study. No hydronephrosis.  Aorta is heavily calcified, non aneurysmal.  Prior hysterectomy. Urinary bladder is decompressed, grossly unremarkable.  Extensive sigmoid diverticulosis. There is a small fluid collection adjacent to the sigmoid colon measuring 2 cm. It is unclear if this represents a small cyst within the left ovary for possible small fluid collection adjacent to the sigmoid colon related to diverticulitis. This was not present in 2014.  Stomach and small bowel are unremarkable. No free fluid, free air or adenopathy.  No acute bony abnormality or focal bone lesion.  IMPRESSION: Sigmoid  diverticulosis. 2 cm fluid collection adjacent to the sigmoid colon in the left pelvis. While this could reflect a small fluid collection/abscess related to diverticulitis, this also may represent a small cystic structure within the left ovary, but is new since 2014. I  favor this is a small diverticular abscess. Consider treatment and repeat short-term imaging.  Bilateral renal cysts.   Electronically Signed   By: Rolm Baptise M.D.   On: 12/13/2014 12:34   Dg Chest Port 1 View  12/19/2014   CLINICAL DATA:  Cough, shortness of breath.  EXAM: PORTABLE CHEST - 1 VIEW  COMPARISON:  12/18/2014  FINDINGS: Interstitial coarsening. Increased bibasilar opacities. There may be small layering pleural effusions. Aortic atherosclerosis. Enlarged cardiac silhouette. Central vascular congestion. No pneumothorax. Osteopenia.  IMPRESSION: Small pleural effusions and increased bibasilar opacities which may reflect atelectasis, aspiration, or pneumonia.  Background COPD.  Recommend radiograph follow-up in 3-4 weeks to document resolution.   Electronically Signed   By: Carlos Levering M.D.   On: 12/19/2014 07:40    CBC  Recent Labs Lab 12/15/14 1514 12/16/14 0605 12/19/14 0510 12/20/14 0450  WBC 8.0 6.0 9.5 10.0  HGB 13.2 11.2* 10.3* 10.9*  HCT 41.9 35.3* 34.1* 36.0  PLT 240 189 188 222  MCV 96.8 98.9 100.0 98.6  MCH 30.5 31.4 30.2 29.9  MCHC 31.5 31.7 30.2 30.3  RDW 12.7 12.8 13.3 13.1  LYMPHSABS 1.9  --   --   --   MONOABS 0.8  --   --   --   EOSABS 0.1  --   --   --   BASOSABS 0.1  --   --   --     Chemistries   Recent Labs Lab 12/13/14 1204 12/15/14 1514 12/16/14 0605 12/19/14 0510 12/20/14 0450  NA  --  140 139 141 142  K  --  4.0 3.6 3.4* 3.1*  CL  --  101 103 106 106  CO2  --  30 31 29 29   GLUCOSE  --  95 125* 116* 108*  BUN  --  19 12 5* 6  CREATININE 1.10 0.84 0.79 1.12* 0.80  CALCIUM  --  9.0 8.3* 7.9* 8.3*  AST  --  17  --  24  --   ALT  --  7  --  7  --   ALKPHOS  --  78  --   62  --   BILITOT  --  0.5  --  0.6  --    ------------------------------------------------------------------------------------------------------------------ estimated creatinine clearance is 53.4 mL/min (by C-G formula based on Cr of 0.8). ------------------------------------------------------------------------------------------------------------------ No results for input(s): HGBA1C in the last 72 hours. ------------------------------------------------------------------------------------------------------------------ No results for input(s): CHOL, HDL, LDLCALC, TRIG, CHOLHDL, LDLDIRECT in the last 72 hours. ------------------------------------------------------------------------------------------------------------------ No results for input(s): TSH, T4TOTAL, T3FREE, THYROIDAB in the last 72 hours.  Invalid input(s): FREET3 ------------------------------------------------------------------------------------------------------------------ No results for input(s): VITAMINB12, FOLATE, FERRITIN, TIBC, IRON, RETICCTPCT in the last 72 hours.  Coagulation profile  Recent Labs Lab 12/19/14 0735  INR 1.43    No results for input(s): DDIMER in the last 72 hours.  Cardiac Enzymes  Recent Labs Lab 12/19/14 1200 12/19/14 1752 12/19/14 2327  TROPONINI 1.62* 1.64* 1.66*   ------------------------------------------------------------------------------------------------------------------ Invalid input(s): POCBNP    Stratton Villwock D.O. on 12/20/2014 at 10:47 AM  Between 7am to 7pm - Pager - [phone]  After 7pm go to [email or web address] - password [email or web address]  And look for the night coverage person covering for me after hours  [facility] Office  [phone]

## 2014-12-20 NOTE — H&P (Signed)
PULMONARY / CRITICAL CARE MEDICINE   Name: Lindsay Mitchell MRN: [id number] DOB: 29-Jun-1941    ADMISSION DATE:  12/15/2014 CONSULTATION DATE:  4/20   REFERRING MD :  Dr. Harl Bowie (cardiology)   CHIEF COMPLAINT:  Hypotension   INITIAL PRESENTATION: 74yo female smoker with hx severe COPD, diverticulosis, fibromyalgia, hx DVT initially admitted 4/15 to [facility] with diverticulitis and contained abd abscess.  She was treated with bowel rest and IV abx and followed by surgery although no surgical intervention was needed.  On 4/19 she developed SOB, chest pain and hypotension.  CTA chest was neg for PE and her troponin was elevated at 1.9.  She was seen in consultation by cardiology who requested tx to [facility] for possible cath and PCCM asked to admit r/t hypotension and pressor needs.   STUDIES:  CT abd/pelvis 4/19>>> improvement in diverticulitis, resolution of diverticular abscess. Interval development of pericholecystic fluid, no gallstones or gallbladder thickening  CTA chest 4/19>>> neg PE, small bilat effusions  2D echo 4/19>>> EF 60-65%, mod dilated RV, PA peak pressure 72mmHg,   SIGNIFICANT EVENTS:    HISTORY OF PRESENT ILLNESS:  74yo female smoker with hx severe COPD, diverticulosis, fibromyalgia, hx DVT initially admitted 4/15 to [facility] with diverticulitis and contained abd abscess.  She was treated with bowel rest and IV abx and followed by surgery although no surgical intervention was needed.  On 4/19 she developed SOB, chest pain and hypotension.  CTA chest was neg for PE and her troponin was elevated at 1.9.  She was seen in consultation by cardiology who requested tx to [facility] for possible cath and PCCM asked to admit r/t hypotension and pressor needs. Currently c/o mild chest, L should pain.  Denies SOB, abd pain, cough, fevers, chills, hemoptysis, purulent sputum, n/v/d.  Denies dizziness, lightheadedness.   PAST MEDICAL HISTORY :   has a past medical history of  Constipation; Depression with anxiety; Diverticulosis; DVT (deep venous thrombosis); COPD (chronic obstructive pulmonary disease); Carotid artery disease; Anxiety; TIA (transient ischemic attack); Full dentures; Pulmonary nodules (08/11/2013); Migraine; Fibromyalgia; Stroke; Protein calorie malnutrition; and First degree AV block.  has past surgical history that includes Abdominal hysterectomy (1971); Back surgery; Colonoscopy (June 2002); Colonoscopy with propofol (N/A, 06/21/2013); Tonsillectomy; Appendectomy; Eye surgery; Nasal sinus surgery (Right, 07/11/2013); Esophagogastroduodenoscopy (N/A, 03/06/2014); Savory dilation (N/A, 03/06/2014); esophageal biopsy (03/06/2014); and Other surgical history. Prior to Admission medications   Medication Sig Start Date End Date Taking? Authorizing Provider  albuterol (PROVENTIL HFA;VENTOLIN HFA) 108 (90 BASE) MCG/ACT inhaler Inhale 2 puffs into the lungs every 4 (four) hours as needed for wheezing or shortness of breath. 08/12/13  Yes Rexene Alberts, MD  aspirin EC 325 MG tablet Take 325 mg by mouth daily.   Yes Historical Provider, MD  ciprofloxacin (CIPRO) 500 MG tablet Take 500 mg by mouth 2 (two) times daily. 10 day course starting 12/13/2014 12/13/14  Yes Historical Provider, MD  gabapentin (NEURONTIN) 300 MG capsule Take 3 capsules (900 mg total) by mouth 3 (three) times daily. 11/09/14  Yes Melvenia Beam, MD  HYDROcodone-acetaminophen (NORCO) 10-325 MG per tablet Take 1 tablet by mouth every 6 (six) hours as needed for moderate pain or severe pain.   Yes Historical Provider, MD  ipratropium-albuterol (DUONEB) 0.5-2.5 (3) MG/3ML SOLN Take 3 mLs by nebulization 3 (three) times daily. 08/12/13  Yes Rexene Alberts, MD  LINZESS 145 MCG CAPS capsule TAKE 1 CAPSULE BY MOUTH EVERY DAY 30 MINUTES PRIOR TO THE FIRST MEAL  OF THE DAY Patient taking differently: TAKE 1 CAPSULE BY MOUTH EVERY DAY 30 MINUTES PRIOR TO THE FIRST MEAL OF THE DAY AS NEEDED FOR CONSTIPATION 07/18/14   Yes Mahala Menghini, PA-C  metroNIDAZOLE (FLAGYL) 500 MG tablet Take 500 mg by mouth 2 (two) times daily. 10 day course starting on 12/13/2014 12/13/14  Yes Historical Provider, MD  methylPREDNISolone (MEDROL DOSEPAK) 4 MG tablet follow package directions Patient not taking: Reported on 11/23/2014 11/13/14   Melvenia Beam, MD  Oxcarbazepine (TRILEPTAL) 300 MG tablet Take 1 tablet (300 mg total) by mouth 2 (two) times daily. Patient not taking: Reported on 12/15/2014 11/28/14   Melvenia Beam, MD   Allergies  Allergen Reactions  . Codeine Other (See Comments)    Makes patient not in right state of mind.   . Oxycodone Itching  . Talwin [Pentazocine] Other (See Comments)    Makes patient not in right state of mind.   . Valium [Diazepam] Other (See Comments)    Just doesn't work.     FAMILY HISTORY:  indicated that her mother is deceased. She indicated that her father is deceased.  SOCIAL HISTORY:  reports that she has been smoking Cigarettes.  She has a 50 pack-year smoking history. She does not have any smokeless tobacco history on file. She reports that she does not drink alcohol or use illicit drugs.  REVIEW OF SYSTEMS:  As per HPI - All other systems reviewed and were neg.    SUBJECTIVE:   VITAL SIGNS: Temp:  [97.6 F (36.4 C)-98.9 F (37.2 C)] 98.7 F (37.1 C) (04/20 1500) Pulse Rate:  [77-125] 95 (04/20 1500) Resp:  [11-27] 13 (04/20 1500) BP: (59-134)/(34-103) 103/40 mmHg (04/20 1500) SpO2:  [85 %-98 %] 93 % (04/20 1500) HEMODYNAMICS:   VENTILATOR SETTINGS:   INTAKE / OUTPUT:  Intake/Output Summary (Last 24 hours) at 12/20/14 1545 Last data filed at 12/20/14 1500  Gross per 24 hour  Intake 1943.87 ml  Output   1250 ml  Net 693.87 ml    PHYSICAL EXAMINATION: General:  Pleasant female, NAD  Neuro:  Awake, alert, appropriate, MAE  HEENT:  Mm dry, no JVD  Cardiovascular:  s1s2 rrr, great radial pulse  Lungs:  resps even, non labored on [place], few exp wheeze  Abdomen:   Soft, mildly tender diffusely  Musculoskeletal:  Warm and dry, no edema   LABS:  CBC  Recent Labs Lab 12/16/14 0605 12/19/14 0510 12/20/14 0450  WBC 6.0 9.5 10.0  HGB 11.2* 10.3* 10.9*  HCT 35.3* 34.1* 36.0  PLT 189 188 222   Coag's  Recent Labs Lab 12/19/14 0735  APTT 46*  INR 1.43   BMET  Recent Labs Lab 12/16/14 0605 12/19/14 0510 12/20/14 0450  NA 139 141 142  K 3.6 3.4* 3.1*  CL 103 106 106  CO2 31 29 29   BUN 12 5* 6  CREATININE 0.79 1.12* 0.80  GLUCOSE 125* 116* 108*   Electrolytes  Recent Labs Lab 12/16/14 0605 12/19/14 0510 12/20/14 0450 12/20/14 1116  CALCIUM 8.3* 7.9* 8.3*  --   MG  --   --   --  1.8   Sepsis Markers  Recent Labs Lab 12/19/14 0510 12/19/14 0735  LATICACIDVEN 2.0 1.9  PROCALCITON  --  0.43   ABG No results for input(s): PHART, PCO2ART, PO2ART in the last 168 hours. Liver Enzymes  Recent Labs Lab 12/15/14 1514 12/19/14 0510  AST 17 24  ALT 7 7  ALKPHOS 78 62  BILITOT 0.5 0.6  ALBUMIN 3.2* 2.4*   Cardiac Enzymes  Recent Labs Lab 12/19/14 1200 12/19/14 1752 12/19/14 2327  TROPONINI 1.62* 1.64* 1.66*   Glucose No results for input(s): GLUCAP in the last 168 hours.  Imaging Ct Angio Chest Pe W/cm &/or Wo Cm  12/19/2014   CLINICAL DATA:  Shortness of breath. Recent surgery for diverticulitis. Chest pain started last night.  EXAM: CT ANGIOGRAPHY CHEST WITH CONTRAST  TECHNIQUE: Multidetector CT imaging of the chest was performed using the standard protocol during bolus administration of intravenous contrast. Multiplanar CT image reconstructions and MIPs were obtained to evaluate the vascular anatomy.  CONTRAST:  56mL OMNIPAQUE IOHEXOL 350 MG/ML SOLN  COMPARISON:  None.  FINDINGS: There is adequate opacification of the pulmonary arteries. There is no pulmonary embolus. The main pulmonary artery, right main pulmonary artery and left main pulmonary arteries are normal in size. The heart size is mildly enlarged.  There is no pericardial effusion. There is coronary artery atherosclerosis in the LAD, circumflex and RCA.  There are bilateral small pleural effusions. There is bilateral compressive atelectasis, left greater than right. There is bilateral centrilobular emphysema. There is no pneumothorax.  There is no axillary, hilar, or mediastinal adenopathy.  There is no lytic or blastic osseous lesion.  The visualized portions of the upper abdomen are unremarkable.  Review of the MIP images confirms the above findings.  IMPRESSION: 1. No evidence pulmonary embolus. 2. Bilateral small pleural effusions and compressive atelectasis. 3. Bilateral centrilobular emphysema.   Electronically Signed   By: Kathreen Devoid   On: 12/19/2014 16:17   Ct Abdomen Pelvis W Contrast  12/19/2014   CLINICAL DATA:  Follow-up diverticular abscess.  Hypotension.  EXAM: CT ABDOMEN AND PELVIS WITH CONTRAST  TECHNIQUE: Multidetector CT imaging of the abdomen and pelvis was performed using the standard protocol following bolus administration of intravenous contrast.  CONTRAST:  136mL OMNIPAQUE IOHEXOL 300 MG/ML  SOLN  COMPARISON:  CT abdomen pelvis 12/13/2011  FINDINGS: Interval resolution of rim enhancing fluid collection posterior to the sigmoid colon compatible with resolution of abscess. Sigmoid diverticular change noted in this area with some thickening of the bowel which also has improved. Findings are consistent with resolving diverticulitis and abscess. No abscess or free fluid on today's study. Negative for bowel obstruction.  Small bilateral pleural effusions with bibasilar atelectasis have developed since the prior study  Focal fatty infiltration of the left lobe of the liver adjacent to the falciform ligament unchanged. Pericholecystic fluid has developed since the prior study however the gallbladder wall is not thickened and no gallstones identified. Correlate with right upper quadrant pain and ultrasound if clinically indicated. Spleen is  normal. Pancreas is atrophic without edema or mass  Negative for renal obstruction or mass. Multiple renal cysts. Largest cyst left lower pole measuring 7 cm  Atherosclerotic aorta without aneurysm. Prior hysterectomy. No adenopathy.  Moderate disc degeneration and spurring in the lower lumbar spine. Negative for fracture  IMPRESSION: Improvement in diverticulitis. Resolution of diverticular abscess in the sigmoid region.  Interval development of pericholecystic fluid. No gallbladder thickening or gallstones. Correlate with pain in this area and possible ultrasound if indicated  Interval development of bilateral pleural effusions and bibasilar atelectasis.   Electronically Signed   By: Franchot Gallo M.D.   On: 12/19/2014 11:28   Dg Chest Port 1 View  12/19/2014   CLINICAL DATA:  Cough, shortness of breath.  EXAM: PORTABLE CHEST - 1 VIEW  COMPARISON:  12/18/2014  FINDINGS:  Interstitial coarsening. Increased bibasilar opacities. There may be small layering pleural effusions. Aortic atherosclerosis. Enlarged cardiac silhouette. Central vascular congestion. No pneumothorax. Osteopenia.  IMPRESSION: Small pleural effusions and increased bibasilar opacities which may reflect atelectasis, aspiration, or pneumonia.  Background COPD.  Recommend radiograph follow-up in 3-4 weeks to document resolution.   Electronically Signed   By: Carlos Levering M.D.   On: 12/19/2014 07:40     ASSESSMENT / PLAN:  PULMONARY Chronic respiratory failure  Severe COPD without acute exacerbation  tobacco abuse  Pulmonary nodules - known since 2014 P:   Supplemental O2 as needed  BD's - duonebs (home rx) F/u CXR  No need systemic steroids at this time  outpt pulm f/u  Ambulatory desat prior to d/c - may need home O2   CARDIOVASCULAR Hypotension - likely cardiogenic given elevated troponin, chest pain and normal lactate, wbc and pct.  ??accuracy of BP readings (now SBP 70's) with great mentation, bounding radial pulse,  asymptomatic.   Chest pain/Elevated troponin  CTA chest neg PE  P:  Cont asa, heparin gtt per cards  ?cath lab - cards to see  Cont low dose dopamine and wean as able - near off  Cont gentle volume Hold BB Repeat lactate  Check manual BP    RENAL AKI - improved  hypokalemia P:   F/u chem  Pressors as needed for hypotension Gentle volume  Replete K as needed   GASTROINTESTINAL Diverticulitis with colonic abscess - improved  Pericholecystic fluid  Severe protein calorie malnutrition  P:   Will alert general surgery to follow peripherally  Cont IV abx  Consider abd u/s with interval development pericholecystic fluid and atypical chest pain but hold for now with lactate, wbc, pct unimpressive  HEMATOLOGIC Anemia - mild  P:  Anemia panel pending   INFECTIOUS Diverticulitis with colonic abscess  P:   BCx2 4/20>>>  cipro 12/15/14>>4/19 Flagyl 12/15/14>>4/19 Vancomycin 4/19>>> Zosyn 4/19>>>  ENDOCRINE No active issue  P:   Monitor   NEUROLOGIC No active issue  P:   Supportive care  PRN pain rx    FAMILY  - Updates:  Pt updated at bedside.  Cardiology and gen surgery alerted to her admission.      Nickolas Madrid, NP 12/20/2014  3:45 PM Pager: [phone] or [phone]

## 2014-12-20 NOTE — Clinical Documentation Improvement (Addendum)
  MD's, NP's, and PA'S  Noted patient  with O2 sats ranging from 76% to 89% intermittently during   4/18-4/20 dates of  hospital stay treated with [place] @ (2-9 L) and Venturi Mask with 8 L flow and 40% FIO2, respirations ranged from 16-30 p/m.  If appropriate for this admit please provide clinical diagnosis pertaining to episodes of hypoxia.  Thank you    Possible Clinical Conditions?  Acute Respiratory Failure Acute on Chronic Respiratory Failure Chronic Respiratory Failure Acute Respiratory Insufficiency Acute Respiratory Distress   Other Condition Cannot Clinically Determine      Risk Factors:Hypotension, Anemia   Signs & Symptoms:noted above  Diagnostics: chest xray: IMPRESSION: Small left pleural effusion and associated airspace opacity; atelectasis versus pneumonia. Recommend a repeat radiograph in 3-4 weeks to document resolution.   Treatment: Oxygen [place] and Venturi mask   Thank You, Ree Kida ,RN Clinical Documentation Specialist:  [phone]  [facility] [employer]

## 2014-12-20 NOTE — Progress Notes (Signed)
Pt is having a sterile procedure done at this time. No HHN given at the due time of 20:00.

## 2014-12-20 NOTE — Progress Notes (Signed)
Patient transferred to [facility], [facility]. Report given to [place] nurses. Husband will meet patient at hospital.

## 2014-12-20 NOTE — Progress Notes (Signed)
Events over the past 24 hours noted. CT scan of the abdomen shows improvement of the diverticulitis with no abscess cavity present. Pericholecystic fluid was noted, but patient is asymptomatic in the right upper quadrant. May advance patient's diet as tolerated. Will follow peripherally with you.

## 2014-12-20 NOTE — Progress Notes (Addendum)
Follow-up Nutrition Note-  INTERVENTION:  Discontinue Resource Breeze po TID, each supplement provides 250 kcal and 9 grams of protein  RD team will continue to follow  NUTRITION DIAGNOSIS: Inadequate oral intake related to N/V and abdominal pain; slowly improving and diet is now fully advanced  Goal: Pt to meet >/= 90% of their estimated nutrition needs   Monitor:  Oral intake and tolerance, procedures, labs  74 y.o. female  ASSESSMENT: 74 y.o. female PMH diverticulosis, DVT, COPD, chronic constipation, fibromyalgia, history of a stroke. Patient seen for 2 weeks of left lower quadrant abdominal pain that has been gradually increasing. Her po intake has been limited due to the pain and N/V prior to admission.  Pt has been cleared to advance diet as tolerated;  her diverticulitis is improved and CT shows no abcess. Her current weight now significantly increased (14%) from admission.Fluid resuscitation vs changes in scales?? Her usual body weight range 110-115#.   Pt says she vomited again last night and stills feels nauseated. Unable to tolerate nutrition supplement [employer]) and says even the smell bothers her. She is going to "try" to eat lunch. When her nausea is better we'll add snacks for her or alternate oral supplement. Pt says she is transferring to [facility] and will not be returning.  Labs: her potassium is trending down.  Nutrition focused exam: mild temporal, clavicle , moderate patellar wasting.  Height: Ht Readings from Last 1 Encounters:  12/19/14 5\' 3"  (1.6 m)    Weight: Wt Readings from Last 1 Encounters:  12/19/14 120 lb 5.9 oz (54.6 kg)  Admit wt 105# 12/15/14 (14% increase) x 5 days  Ideal Body Weight: 115 #  Wt Readings from Last 10 Encounters:  12/19/14 120 lb 5.9 oz (54.6 kg)  11/23/14 113 lb 9.6 oz (51.529 kg)  11/07/14 111 lb (50.349 kg)  10/31/14 112 lb (50.803 kg)  04/28/14 114 lb (51.71 kg)  02/15/14 114 lb (51.71 kg)  08/11/13 110 lb  14.4 oz (50.304 kg)  07/20/13 110 lb (49.896 kg)  07/11/13 112 lb (50.803 kg)  06/01/13 109 lb 3.2 oz (49.533 kg)   Usual Body Weight: 110-115#  BMI:  Body mass index is 21.33 kg/(m^2). normal range  Estimated Nutritional Needs: Kcal: 1600-1800 Protein: 70-80 gr Fluid: 1.6-1.8 liters daily  Skin:  WDL  Diet Order: Diet Heart Room service appropriate?: Yes; Fluid consistency:: Thin  EDUCATION NEEDS: -No education needs identified at this time   Intake/Output Summary (Last 24 hours) at 12/20/14 1101 Last data filed at 12/20/14 1002  Gross per 24 hour  Intake 2120.27 ml  Output    950 ml  Net 1170.27 ml    Last BM: 4/18  Labs:   Recent Labs Lab 12/16/14 0605 12/19/14 0510 12/20/14 0450  NA 139 141 142  K 3.6 3.4* 3.1*  CL 103 106 106  CO2 31 29 29   BUN 12 5* 6  CREATININE 0.79 1.12* 0.80  CALCIUM 8.3* 7.9* 8.3*  GLUCOSE 125* 116* 108*    CBG (last 3)  No results for input(s): GLUCAP in the last 72 hours.  Scheduled Meds: . aspirin EC  81 mg Oral Daily  . atorvastatin  80 mg Oral q1800  . feeding supplement (RESOURCE BREEZE)  1 Container Oral TID BM  . gabapentin  900 mg Oral TID  . ipratropium-albuterol  3 mL Nebulization Q4H  . Linaclotide  145 mcg Oral Daily  . piperacillin-tazobactam (ZOSYN)  IV  3.375 g Intravenous Q8H  .  potassium chloride  40 mEq Oral Once  . vancomycin  500 mg Intravenous Q12H    Continuous Infusions: . sodium chloride 50 mL/hr at 12/20/14 0600  . DOPamine 5 mcg/kg/min (12/20/14 1000)  . heparin Stopped (12/20/14 0258)    Past Medical History  Diagnosis Date  . Constipation   . Depression with anxiety   . Diverticulosis   . DVT (deep venous thrombosis)   . COPD (chronic obstructive pulmonary disease)     Emphysema radiographically  . Carotid artery disease     a. duplex 12/2776: RICA <24%, LICA 23-53%. Followed by [facility].  . Anxiety   . TIA (transient ischemic attack)   . Full dentures   . Pulmonary nodules  08/11/2013    a. CT angio 08/2013: 4-31mm nodules in RUL/RML, f/u recommended 6 months.  . Migraine   . Fibromyalgia   . Stroke   . Protein calorie malnutrition   . First degree AV block     Past Surgical History  Procedure Laterality Date  . Abdominal hysterectomy  1971  . Back surgery      X3  . Colonoscopy  June 2002    Dr. Ferdinand Lango: severe diverticular disease with haustral hypertrophy, primary and secondary diverticulosis, persistent spasticity, early stenosis  . Colonoscopy with propofol N/A 06/21/2013    Dr. Oneida Alar: sigmoid colon and descending colon with diverticula, small internal hemorrhoids  . Tonsillectomy    . Appendectomy    . Eye surgery      both cataracts-lenses  . Nasal sinus surgery Right 07/11/2013    Procedure: RIGHT ENDOSCOPIC ANTERIOR ETHMOIDECTOMY/RIGHT ENDOSCOPIC MAXILLARY ANTROSTOMY WITH REMOVAL OF TISSUE;  Surgeon: Ascencion Dike, MD;  Location: [place];  Service: ENT;  Laterality: Right;  . Esophagogastroduodenoscopy N/A 03/06/2014    Procedure: ESOPHAGOGASTRODUODENOSCOPY (EGD);  Surgeon: Danie Binder, MD;  [facility];  Service: Endoscopy;  Laterality: N/A;  9;30  . Savory dilation N/A 03/06/2014    Procedure: SAVORY DILATION;  Surgeon: Danie Binder, MD;  [facility];  Service: Endoscopy;  Laterality: N/A;  . Esophageal biopsy  03/06/2014    Procedure: BIOPSY;  Surgeon: Danie Binder, MD;  [facility];  Service: Endoscopy;;  . Other surgical history      scar tissue removal x2    Colman Cater MS,RD,CSG,LDN Office: [phone] Pager: [phone]

## 2014-12-20 NOTE — Progress Notes (Signed)
E-link md/RN updated 0n  troponins 1.66 & 1.64

## 2014-12-20 NOTE — Progress Notes (Signed)
Peripherally Inserted Central Catheter/Midline Placement  The IV Nurse has discussed with the patient and/or persons authorized to consent for the patient, the purpose of this procedure and the potential benefits and risks involved with this procedure.  The benefits include less needle sticks, lab draws from the catheter and patient may be discharged home with the catheter.  Risks include, but not limited to, infection, bleeding, blood clot (thrombus formation), and puncture of an artery; nerve damage and irregular heat beat.  Alternatives to this procedure were also discussed.  PICC/Midline Placement Documentation  PICC Triple Lumen 38/88/75 PICC Right Basilic 38 cm 1 cm (Active)  Exposed Catheter (cm) 1 cm 12/20/2014  9:00 PM  Dressing Change Due 12/27/14 12/20/2014  9:00 PM       Lamont Glasscock, Maricela Bo 12/20/2014, 9:27 PM

## 2014-12-20 NOTE — Consult Note (Signed)
Lindsay Mitchell [birth year]  [id number].   Requesting MD: Brand Males Chief Complaint/Reason for Consult: diverticulitis, resolving HPI: This is a 74 yo white female with a h/o COPD, CVA, DVT, etc who presented to [facility] a week ago for LLQ abdominal pain and was diagnosed with diverticulitis and a small abscess.  She was admitted and Dr. Arnoldo Morale followed her up there.  She apparently has had an improvement in her abdominal pain, but has had persistent nausea and vomiting, until today.  On Monday night, she started having some substernal chest pain that radiated to her left shoulder.  She had a CTA to rule out a PE and a repeat CT of her abdomen due to hypotension to rule out sepsis from an abdominal source.  Her repeat scan showed improvement in her diverticulitis and a resolution of her abscess.  However, she was then found to have elevated troponins.  She was then transferred down here to [facility] for cardiology evaluation and cardiac catheterization.  She was given a regular diet today and tolerated it well.  We have been asked to evaluate for further evaluation.  ROS : Please see HPI, otherwise negative   Family History  Problem Relation Age of Onset  . Colon cancer Neg Hx   . Breast cancer Mother   . Heart disease Mother   . Hypertension Mother   . Heart attack Mother   . Other Mother     varicose veins  . Cancer Mother   . Varicose Veins Mother   . Diabetes Brother   . Hypertension Brother   . Heart disease Brother   . Throat cancer Father   . Cancer Father     Past Medical History  Diagnosis Date  . Constipation   . Depression with anxiety   . Diverticulosis   . DVT (deep venous thrombosis)   . COPD (chronic obstructive pulmonary disease)     Emphysema radiographically  . Carotid artery disease     a. duplex 10/3555: RICA <32%, LICA 20-25%. Followed by [facility].  . Anxiety   . TIA (transient ischemic attack)   . Full dentures   . Pulmonary nodules 08/11/2013    a. CT angio  08/2013: 4-49m nodules in RUL/RML, f/u recommended 6 months.  . Migraine   . Fibromyalgia   . Stroke   . Protein calorie malnutrition   . First degree AV block     Past Surgical History  Procedure Laterality Date  . Abdominal hysterectomy  1971  . Back surgery      X3  . Colonoscopy  June 2002    Dr. PFerdinand Lango severe diverticular disease with haustral hypertrophy, primary and secondary diverticulosis, persistent spasticity, early stenosis  . Colonoscopy with propofol N/A 06/21/2013    Dr. FOneida Alar sigmoid colon and descending colon with diverticula, small internal hemorrhoids  . Tonsillectomy    . Appendectomy    . Eye surgery      both cataracts-lenses  . Nasal sinus surgery Right 07/11/2013    Procedure: RIGHT ENDOSCOPIC ANTERIOR ETHMOIDECTOMY/RIGHT ENDOSCOPIC MAXILLARY ANTROSTOMY WITH REMOVAL OF TISSUE;  Surgeon: SAscencion Dike MD;  Location: [facility]  Service: ENT;  Laterality: Right;  . Esophagogastroduodenoscopy N/A 03/06/2014    Procedure: ESOPHAGOGASTRODUODENOSCOPY (EGD);  Surgeon: SDanie Binder MD;  [facility];  Service: Endoscopy;  Laterality: N/A;  9;30  . Savory dilation N/A 03/06/2014    Procedure: SAVORY DILATION;  Surgeon: SDanie Binder MD;  [facility];  Service:  Endoscopy;  Laterality: N/A;  . Esophageal biopsy  03/06/2014    Procedure: BIOPSY;  Surgeon: Danie Binder, MD;  [facility];  Service: Endoscopy;;  . Other surgical history      scar tissue removal x2    Social History:  reports that she has been smoking Cigarettes.  She has a 50 pack-year smoking history. She does not have any smokeless tobacco history on file. She reports that she does not drink alcohol or use illicit drugs.  Allergies:  Allergies  Allergen Reactions  . Codeine Other (See Comments)    Makes patient not in right state of mind.   . Oxycodone Itching  . Talwin [Pentazocine] Other (See Comments)    Makes patient not in right state of  mind.   . Valium [Diazepam] Other (See Comments)    Just doesn't work.     Medications Prior to Admission  Medication Sig Dispense Refill  . albuterol (PROVENTIL HFA;VENTOLIN HFA) 108 (90 BASE) MCG/ACT inhaler Inhale 2 puffs into the lungs every 4 (four) hours as needed for wheezing or shortness of breath. 1 Inhaler 12  . aspirin EC 325 MG tablet Take 325 mg by mouth daily.    . ciprofloxacin (CIPRO) 500 MG tablet Take 500 mg by mouth 2 (two) times daily. 10 day course starting 12/13/2014  0  . gabapentin (NEURONTIN) 300 MG capsule Take 3 capsules (900 mg total) by mouth 3 (three) times daily. 270 capsule 6  . HYDROcodone-acetaminophen (NORCO) 10-325 MG per tablet Take 1 tablet by mouth every 6 (six) hours as needed for moderate pain or severe pain.    Marland Kitchen ipratropium-albuterol (DUONEB) 0.5-2.5 (3) MG/3ML SOLN Take 3 mLs by nebulization 3 (three) times daily.    Marland Kitchen LINZESS 145 MCG CAPS capsule TAKE 1 CAPSULE BY MOUTH EVERY DAY 30 MINUTES PRIOR TO THE FIRST MEAL OF THE DAY (Patient taking differently: TAKE 1 CAPSULE BY MOUTH EVERY DAY 30 MINUTES PRIOR TO THE FIRST MEAL OF THE DAY AS NEEDED FOR CONSTIPATION) 30 capsule 11  . metroNIDAZOLE (FLAGYL) 500 MG tablet Take 500 mg by mouth 2 (two) times daily. 10 day course starting on 12/13/2014  0  . methylPREDNISolone (MEDROL DOSEPAK) 4 MG tablet follow package directions (Patient not taking: Reported on 11/23/2014) 21 tablet 1  . Oxcarbazepine (TRILEPTAL) 300 MG tablet Take 1 tablet (300 mg total) by mouth 2 (two) times daily. (Patient not taking: Reported on 12/15/2014) 60 tablet 6    Blood pressure 103/40, pulse 95, temperature 98.7 F (37.1 C), temperature source Oral, resp. rate 13, height _0  (1.6 m), weight 54.6 kg (120 lb 5.9 oz), SpO2 93 %. Physical Exam: General: pleasant, skinny white female who is laying in bed in NAD HEENT: head is normocephalic, atraumatic.  Sclera are noninjected.  PERRL.  Ears and nose without any masses or lesions.  Mouth  is pink and moist Heart: regular, rate, and rhythm.  Normal s1,s2. No obvious murmurs, gallops, or rubs noted.  Palpable radial and pedal pulses bilaterally Lungs: bilateral wheezes and a few rhonchi.  Respiratory effort nonlabored Abd: soft, mild tenderness in LLQ, but otherwise nontender, ND, +BS, no masses, hernias, or organomegaly MS: all 4 extremities are symmetrical with no cyanosis, clubbing, or edema. Skin: warm and dry with no masses, lesions, or rashes Psych: A&Ox3 with an appropriate affect.    Results for orders placed or performed during the hospital encounter of 12/15/14 (from the past 48 hour(s))  Troponin I  Status: None   Collection Time: 12/18/14  5:52 PM  Result Value Ref Range   Troponin I <0.03 <0.031 ng/mL    Comment:        NO INDICATION OF MYOCARDIAL INJURY.   CBC     Status: Abnormal   Collection Time: 12/19/14  5:10 AM  Result Value Ref Range   WBC 9.5 4.0 - 10.5 K/uL   RBC 3.41 (L) 3.87 - 5.11 MIL/uL   Hemoglobin 10.3 (L) 12.0 - 15.0 g/dL   HCT 34.1 (L) 36.0 - 46.0 %   MCV 100.0 78.0 - 100.0 fL   MCH 30.2 26.0 - 34.0 pg   MCHC 30.2 30.0 - 36.0 g/dL   RDW 13.3 11.5 - 15.5 %   Platelets 188 150 - 400 K/uL  Troponin I     Status: Abnormal   Collection Time: 12/19/14  5:10 AM  Result Value Ref Range   Troponin I 1.91 (HH) <0.031 ng/mL    Comment: CRITICAL RESULT CALLED TO, READ BACK BY AND VERIFIED WITH: HAMMOCK,S AT 6:30AM ON 12/19/14 BY FESTERMAN,C        POSSIBLE MYOCARDIAL ISCHEMIA. SERIAL TESTING RECOMMENDED.   Comprehensive metabolic panel     Status: Abnormal   Collection Time: 12/19/14  5:10 AM  Result Value Ref Range   Sodium 141 135 - 145 mmol/L   Potassium 3.4 (L) 3.5 - 5.1 mmol/L   Chloride 106 96 - 112 mmol/L   CO2 29 19 - 32 mmol/L   Glucose, Bld 116 (H) 70 - 99 mg/dL   BUN 5 (L) 6 - 23 mg/dL   Creatinine, Ser 1.12 (H) 0.50 - 1.10 mg/dL   Calcium 7.9 (L) 8.4 - 10.5 mg/dL   Total Protein 4.8 (L) 6.0 - 8.3 g/dL   Albumin 2.4  (L) 3.5 - 5.2 g/dL   AST 24 0 - 37 U/L   ALT 7 0 - 35 U/L   Alkaline Phosphatase 62 39 - 117 U/L   Total Bilirubin 0.6 0.3 - 1.2 mg/dL   GFR calc non Af Amer 48 (L) >90 mL/min   GFR calc Af Amer 56 (L) >90 mL/min    Comment: (NOTE) The eGFR has been calculated using the CKD EPI equation. This calculation has not been validated in all clinical situations. eGFR's persistently <90 mL/min signify possible Chronic Kidney Disease.    Anion gap 6 5 - 15  Lactic acid, plasma     Status: None   Collection Time: 12/19/14  5:10 AM  Result Value Ref Range   Lactic Acid, Venous 2.0 0.5 - 2.0 mmol/L  MRSA PCR Screening     Status: None   Collection Time: 12/19/14  5:35 AM  Result Value Ref Range   MRSA by PCR NEGATIVE NEGATIVE    Comment:        The GeneXpert MRSA Assay (FDA approved for NASAL specimens only), is one component of a comprehensive MRSA colonization surveillance program. It is not intended to diagnose MRSA infection nor to guide or monitor treatment for MRSA infections.   Lactic acid, plasma     Status: None   Collection Time: 12/19/14  7:35 AM  Result Value Ref Range   Lactic Acid, Venous 1.9 0.5 - 2.0 mmol/L  Culture, blood (x 2)     Status: None (Preliminary result)   Collection Time: 12/19/14  7:35 AM  Result Value Ref Range   Specimen Description BLOOD RIGHT WRIST    Special Requests BOTTLES DRAWN AEROBIC AND ANAEROBIC  [place]    Culture NO GROWTH 1 DAY    Report Status PENDING   Procalcitonin     Status: None   Collection Time: 12/19/14  7:35 AM  Result Value Ref Range   Procalcitonin 0.43 ng/mL    Comment:        Interpretation: PCT (Procalcitonin) <= 0.5 ng/mL: Systemic infection (sepsis) is not likely. Local bacterial infection is possible. (NOTE)         ICU PCT Algorithm               Non ICU PCT Algorithm    ----------------------------     ------------------------------         PCT < 0.25 ng/mL                 PCT < 0.1 ng/mL     Stopping of  antibiotics            Stopping of antibiotics       strongly encouraged.               strongly encouraged.    ----------------------------     ------------------------------       PCT level decrease by               PCT < 0.25 ng/mL       >= 80% from peak PCT       OR PCT 0.25 - 0.5 ng/mL          Stopping of antibiotics                                             encouraged.     Stopping of antibiotics           encouraged.    ----------------------------     ------------------------------       PCT level decrease by              PCT >= 0.25 ng/mL       < 80% from peak PCT        AND PCT >= 0.5 ng/mL            Continuin g antibiotics                                              encouraged.       Continuing antibiotics            encouraged.    ----------------------------     ------------------------------     PCT level increase compared          PCT > 0.5 ng/mL         with peak PCT AND          PCT >= 0.5 ng/mL             Escalation of antibiotics                                          strongly encouraged.      Escalation of antibiotics        strongly encouraged.   Protime-INR     Status: Abnormal  Collection Time: 12/19/14  7:35 AM  Result Value Ref Range   Prothrombin Time 17.6 (H) 11.6 - 15.2 seconds   INR 1.43 0.00 - 1.49  APTT     Status: Abnormal   Collection Time: 12/19/14  7:35 AM  Result Value Ref Range   aPTT 46 (H) 24 - 37 seconds    Comment:        IF BASELINE aPTT IS ELEVATED, SUGGEST PATIENT RISK ASSESSMENT BE USED TO DETERMINE APPROPRIATE ANTICOAGULANT THERAPY.   Culture, blood (x 2)     Status: None (Preliminary result)   Collection Time: 12/19/14  7:40 AM  Result Value Ref Range   Specimen Description BLOOD LEFT HAND    Special Requests      BOTTLES DRAWN AEROBIC AND ANAEROBIC AEB=8CC ANA=6CC   Culture NO GROWTH 1 DAY    Report Status PENDING   Troponin I     Status: Abnormal   Collection Time: 12/19/14  9:11 AM  Result Value Ref Range    Troponin I 1.76 (HH) <0.031 ng/mL    Comment: CRITICAL VALUE NOTED.  VALUE IS CONSISTENT WITH PREVIOUSLY REPORTED AND CALLED VALUE.        POSSIBLE MYOCARDIAL ISCHEMIA. SERIAL TESTING RECOMMENDED.   Troponin I (q 6hr x 3)     Status: Abnormal   Collection Time: 12/19/14 12:00 PM  Result Value Ref Range   Troponin I 1.62 (HH) <0.031 ng/mL    Comment: CRITICAL VALUE NOTED.  VALUE IS CONSISTENT WITH PREVIOUSLY REPORTED AND CALLED VALUE.        POSSIBLE MYOCARDIAL ISCHEMIA. SERIAL TESTING RECOMMENDED.   Troponin I (q 6hr x 3)     Status: Abnormal   Collection Time: 12/19/14  5:52 PM  Result Value Ref Range   Troponin I 1.64 (HH) <0.031 ng/mL    Comment: RESULT REPEATED AND VERIFIED CRITICAL RESULT CALLED TO, READ BACK BY AND VERIFIED WITH: LEE,B AT 1927 ON 12/19/2014 BY ISLEY,B        POSSIBLE MYOCARDIAL ISCHEMIA. SERIAL TESTING RECOMMENDED.   Heparin level (unfractionated)     Status: Abnormal   Collection Time: 12/19/14  5:52 PM  Result Value Ref Range   Heparin Unfractionated 0.29 (L) 0.30 - 0.70 IU/mL    Comment:        IF HEPARIN RESULTS ARE BELOW EXPECTED VALUES, AND PATIENT DOSAGE HAS BEEN CONFIRMED, SUGGEST FOLLOW UP TESTING OF ANTITHROMBIN III LEVELS.   Troponin I (q 6hr x 3)     Status: Abnormal   Collection Time: 12/19/14 11:27 PM  Result Value Ref Range   Troponin I 1.66 (HH) <0.031 ng/mL    Comment:        POSSIBLE MYOCARDIAL ISCHEMIA. SERIAL TESTING RECOMMENDED.   Basic metabolic panel     Status: Abnormal   Collection Time: 12/20/14  4:50 AM  Result Value Ref Range   Sodium 142 135 - 145 mmol/L   Potassium 3.1 (L) 3.5 - 5.1 mmol/L   Chloride 106 96 - 112 mmol/L   CO2 29 19 - 32 mmol/L   Glucose, Bld 108 (H) 70 - 99 mg/dL   BUN 6 6 - 23 mg/dL   Creatinine, Ser 0.80 0.50 - 1.10 mg/dL   Calcium 8.3 (L) 8.4 - 10.5 mg/dL   GFR calc non Af Amer 72 (L) >90 mL/min   GFR calc Af Amer 84 (L) >90 mL/min    Comment: (NOTE) The eGFR has been calculated  using the CKD EPI equation. This calculation has not been  validated in all clinical situations. eGFR's persistently <90 mL/min signify possible Chronic Kidney Disease.    Anion gap 7 5 - 15  CBC     Status: Abnormal   Collection Time: 12/20/14  4:50 AM  Result Value Ref Range   WBC 10.0 4.0 - 10.5 K/uL   RBC 3.65 (L) 3.87 - 5.11 MIL/uL   Hemoglobin 10.9 (L) 12.0 - 15.0 g/dL   HCT 36.0 36.0 - 46.0 %   MCV 98.6 78.0 - 100.0 fL   MCH 29.9 26.0 - 34.0 pg   MCHC 30.3 30.0 - 36.0 g/dL   RDW 13.1 11.5 - 15.5 %   Platelets 222 150 - 400 K/uL  Heparin level (unfractionated)     Status: Abnormal   Collection Time: 12/20/14  4:50 AM  Result Value Ref Range   Heparin Unfractionated 0.13 (L) 0.30 - 0.70 IU/mL    Comment:        IF HEPARIN RESULTS ARE BELOW EXPECTED VALUES, AND PATIENT DOSAGE HAS BEEN CONFIRMED, SUGGEST FOLLOW UP TESTING OF ANTITHROMBIN III LEVELS.   Magnesium     Status: None   Collection Time: 12/20/14 11:16 AM  Result Value Ref Range   Magnesium 1.8 1.5 - 2.5 mg/dL  Reticulocytes     Status: Abnormal   Collection Time: 12/20/14 11:16 AM  Result Value Ref Range   Retic Ct Pct 1.5 0.4 - 3.1 %   RBC. 3.41 (L) 3.87 - 5.11 MIL/uL   Retic Count, Manual 51.2 19.0 - 186.0 K/uL   Ct Angio Chest Pe W/cm &/or Wo Cm  12/19/2014   CLINICAL DATA:  Shortness of breath. Recent surgery for diverticulitis. Chest pain started last night.  EXAM: CT ANGIOGRAPHY CHEST WITH CONTRAST  TECHNIQUE: Multidetector CT imaging of the chest was performed using the standard protocol during bolus administration of intravenous contrast. Multiplanar CT image reconstructions and MIPs were obtained to evaluate the vascular anatomy.  CONTRAST:  72m OMNIPAQUE IOHEXOL 350 MG/ML SOLN  COMPARISON:  None.  FINDINGS: There is adequate opacification of the pulmonary arteries. There is no pulmonary embolus. The main pulmonary artery, right main pulmonary artery and left main pulmonary arteries are normal in size.  The heart size is mildly enlarged. There is no pericardial effusion. There is coronary artery atherosclerosis in the LAD, circumflex and RCA.  There are bilateral small pleural effusions. There is bilateral compressive atelectasis, left greater than right. There is bilateral centrilobular emphysema. There is no pneumothorax.  There is no axillary, hilar, or mediastinal adenopathy.  There is no lytic or blastic osseous lesion.  The visualized portions of the upper abdomen are unremarkable.  Review of the MIP images confirms the above findings.  IMPRESSION: 1. No evidence pulmonary embolus. 2. Bilateral small pleural effusions and compressive atelectasis. 3. Bilateral centrilobular emphysema.   Electronically Signed   By: HKathreen Devoid  On: 12/19/2014 16:17   Ct Abdomen Pelvis W Contrast  12/19/2014   CLINICAL DATA:  Follow-up diverticular abscess.  Hypotension.  EXAM: CT ABDOMEN AND PELVIS WITH CONTRAST  TECHNIQUE: Multidetector CT imaging of the abdomen and pelvis was performed using the standard protocol following bolus administration of intravenous contrast.  CONTRAST:  1048mOMNIPAQUE IOHEXOL 300 MG/ML  SOLN  COMPARISON:  CT abdomen pelvis 12/13/2011  FINDINGS: Interval resolution of rim enhancing fluid collection posterior to the sigmoid colon compatible with resolution of abscess. Sigmoid diverticular change noted in this area with some thickening of the bowel which also has improved. Findings are consistent  with resolving diverticulitis and abscess. No abscess or free fluid on today's study. Negative for bowel obstruction.  Small bilateral pleural effusions with bibasilar atelectasis have developed since the prior study  Focal fatty infiltration of the left lobe of the liver adjacent to the falciform ligament unchanged. Pericholecystic fluid has developed since the prior study however the gallbladder wall is not thickened and no gallstones identified. Correlate with right upper quadrant pain and ultrasound  if clinically indicated. Spleen is normal. Pancreas is atrophic without edema or mass  Negative for renal obstruction or mass. Multiple renal cysts. Largest cyst left lower pole measuring 7 cm  Atherosclerotic aorta without aneurysm. Prior hysterectomy. No adenopathy.  Moderate disc degeneration and spurring in the lower lumbar spine. Negative for fracture  IMPRESSION: Improvement in diverticulitis. Resolution of diverticular abscess in the sigmoid region.  Interval development of pericholecystic fluid. No gallbladder thickening or gallstones. Correlate with pain in this area and possible ultrasound if indicated  Interval development of bilateral pleural effusions and bibasilar atelectasis.   Electronically Signed   By: Franchot Gallo M.D.   On: 12/19/2014 11:28   Dg Chest Port 1 View  12/19/2014   CLINICAL DATA:  Cough, shortness of breath.  EXAM: PORTABLE CHEST - 1 VIEW  COMPARISON:  12/18/2014  FINDINGS: Interstitial coarsening. Increased bibasilar opacities. There may be small layering pleural effusions. Aortic atherosclerosis. Enlarged cardiac silhouette. Central vascular congestion. No pneumothorax. Osteopenia.  IMPRESSION: Small pleural effusions and increased bibasilar opacities which may reflect atelectasis, aspiration, or pneumonia.  Background COPD.  Recommend radiograph follow-up in 3-4 weeks to document resolution.   Electronically Signed   By: Carlos Levering M.D.   On: 12/19/2014 07:40       Assessment/Plan 1. Acute diverticulitis with abscess -repeat CT scan shows improving diverticulitis and resolution of abscess -agree with diet -no acute surgical intervention needed as she is improving -she has had a total of 7 days of abx therapy.  She does not need any further abx therapy from our standpoint -will follow with you. 2. Elevated troponins 3. COPD   Elene Downum E 12/20/2014, 4:13 PM Pager: [phone]

## 2014-12-20 NOTE — Progress Notes (Addendum)
[facility] for Heparin Indication: chest pain/ACS/Rule-Out PE  Allergies  Allergen Reactions  . Codeine Other (See Comments)    Makes patient not in right state of mind.   . Oxycodone Itching  . Talwin [Pentazocine] Other (See Comments)    Makes patient not in right state of mind.   . Valium [Diazepam] Other (See Comments)    Just doesn't work.    Patient Measurements: Height: 5\' 3"  (160 cm) Weight: 120 lb 5.9 oz (54.6 kg) IBW/kg (Calculated) : 52.4  HEPARIN DW (KG): 54.6   Vital Signs: Temp: 98.5 F (36.9 C) (04/20 0007) Temp Source: Oral (04/20 0007) BP: 132/54 mmHg (04/20 0700) Pulse Rate: 95 (04/20 0700)  Labs:  Recent Labs  12/19/14 0510 12/19/14 0735  12/19/14 1200 12/19/14 1752 12/19/14 2327 12/20/14 0450  HGB 10.3*  --   --   --   --   --  10.9*  HCT 34.1*  --   --   --   --   --  36.0  PLT 188  --   --   --   --   --  222  APTT  --  46*  --   --   --   --   --   LABPROT  --  17.6*  --   --   --   --   --   INR  --  1.43  --   --   --   --   --   HEPARINUNFRC  --   --   --   --  0.29*  --  0.13*  CREATININE 1.12*  --   --   --   --   --  0.80  TROPONINI 1.91*  --   < > 1.62* 1.64* 1.66*  --   < > = values in this interval not displayed.  Estimated Creatinine Clearance: 53.4 mL/min (by C-G formula based on Cr of 0.8).  Medical History: Past Medical History  Diagnosis Date  . Constipation   . Depression with anxiety   . Diverticulosis   . DVT (deep venous thrombosis)   . COPD (chronic obstructive pulmonary disease)     Emphysema radiographically  . Carotid artery disease     a. duplex 09/3242: RICA <01%, LICA 02-72%. Followed by [facility].  . Anxiety   . TIA (transient ischemic attack)   . Full dentures   . Pulmonary nodules 08/11/2013    a. CT angio 08/2013: 4-36mm nodules in RUL/RML, f/u recommended 6 months.  . Migraine   . Fibromyalgia   . Stroke   . Protein calorie malnutrition   . First degree AV block     Medications:  Prescriptions prior to admission  Medication Sig Dispense Refill Last Dose  . albuterol (PROVENTIL HFA;VENTOLIN HFA) 108 (90 BASE) MCG/ACT inhaler Inhale 2 puffs into the lungs every 4 (four) hours as needed for wheezing or shortness of breath. 1 Inhaler 12 unknown  . aspirin EC 325 MG tablet Take 325 mg by mouth daily.   12/14/2014 at Unknown time  . ciprofloxacin (CIPRO) 500 MG tablet Take 500 mg by mouth 2 (two) times daily. 10 day course starting 12/13/2014  0 12/15/2014 at Unknown time  . gabapentin (NEURONTIN) 300 MG capsule Take 3 capsules (900 mg total) by mouth 3 (three) times daily. 270 capsule 6 12/14/2014 at Unknown time  . HYDROcodone-acetaminophen (NORCO) 10-325 MG per tablet Take 1 tablet by mouth every 6 (six) hours  as needed for moderate pain or severe pain.   12/14/2014 at Unknown time  . ipratropium-albuterol (DUONEB) 0.5-2.5 (3) MG/3ML SOLN Take 3 mLs by nebulization 3 (three) times daily.   unknown  . LINZESS 145 MCG CAPS capsule TAKE 1 CAPSULE BY MOUTH EVERY DAY 30 MINUTES PRIOR TO THE FIRST MEAL OF THE DAY (Patient taking differently: TAKE 1 CAPSULE BY MOUTH EVERY DAY 30 MINUTES PRIOR TO THE FIRST MEAL OF THE DAY AS NEEDED FOR CONSTIPATION) 30 capsule 11 12/14/2014 at Unknown time  . metroNIDAZOLE (FLAGYL) 500 MG tablet Take 500 mg by mouth 2 (two) times daily. 10 day course starting on 12/13/2014  0 12/15/2014 at Unknown time  . methylPREDNISolone (MEDROL DOSEPAK) 4 MG tablet follow package directions (Patient not taking: Reported on 11/23/2014) 21 tablet 1 Not Taking  . Oxcarbazepine (TRILEPTAL) 300 MG tablet Take 1 tablet (300 mg total) by mouth 2 (two) times daily. (Patient not taking: Reported on 12/15/2014) 60 tablet 6    Assessment: 74yo female was being treated for diverticular abscess.  Pt c/o CP and SOB.  Cardiology consulted. Troponins elevated.  Pt being treated for sepsis with hydration and ABX.  Pt has low BP. Baseline aPTT and Protime elevated.  Initial  Heparin level slightly below goal, repeat AM Heparin Level below goal despite rate increase.  CT (-) PE.  Goal of Therapy:  Heparin level 0.3-0.7 units/ml w/in 24 hours of initiation Monitor platelets by anticoagulation protocol: Yes   Plan:  Plan:  Discussed with [facility] MD. Restart Heparin w/ potential cardiac w/u @ [place] planned. Patient did receive enoxaparin 40mg  this AM. Resume Heparin @ 850 units/hr Repeat Hep Level in 6-8 hours. Daily Hep Level and CBC while on Heparin.  Pricilla Larsson, [facility] 12/20/2014 10:48 AM

## 2014-12-20 NOTE — Progress Notes (Signed)
Patient ID: Lindsay Mitchell, female   DOB: Nov 10, 1940, 74 y.o.   MRN: [id number]     Subjective:   Shoulder pain overnight.    Objective:   Temp:  [97.6 F (36.4 C)-99.8 F (37.7 C)] 97.8 F (36.6 C) (04/20 0800) Pulse Rate:  [79-122] 94 (04/20 0800) Resp:  [11-30] 13 (04/20 0800) BP: (71-136)/(34-103) 119/77 mmHg (04/20 0800) SpO2:  [76 %-99 %] 98 % (04/20 0800) FiO2 (%):  [40 %] 40 % (04/19 1400) Last BM Date: 12/18/14  [facility] Weights   12/15/14 1207 12/15/14 1838 12/19/14 0532  Weight: 105 lb (47.628 kg) 109 lb (49.442 kg) 120 lb 5.9 oz (54.6 kg)    Intake/Output Summary (Last 24 hours) at 12/20/14 0842 Last data filed at 12/20/14 2992  Gross per 24 hour  Intake 2163.01 ml  Output    950 ml  Net 1213.01 ml    Telemetry: SR, occas PVCs  Exam:  General: NAD  Resp: CTAB  Cardiac: RRR, no m/r/g, no JVD  EQ:ASTMHDQ soft, NT, ND  MSK:no LE edema  Neuro: no focal deficits    Lab Results:  Basic Metabolic Panel:  Recent Labs Lab 12/16/14 0605 12/19/14 0510 12/20/14 0450  NA 139 141 142  K 3.6 3.4* 3.1*  CL 103 106 106  CO2 31 29 29   GLUCOSE 125* 116* 108*  BUN 12 5* 6  CREATININE 0.79 1.12* 0.80  CALCIUM 8.3* 7.9* 8.3*    Liver Function Tests:  Recent Labs Lab 12/15/14 1514 12/19/14 0510  AST 17 24  ALT 7 7  ALKPHOS 78 62  BILITOT 0.5 0.6  PROT 6.5 4.8*  ALBUMIN 3.2* 2.4*    CBC:  Recent Labs Lab 12/16/14 0605 12/19/14 0510 12/20/14 0450  WBC 6.0 9.5 10.0  HGB 11.2* 10.3* 10.9*  HCT 35.3* 34.1* 36.0  MCV 98.9 100.0 98.6  PLT 189 188 222    Cardiac Enzymes:  Recent Labs Lab 12/19/14 1200 12/19/14 1752 12/19/14 2327  TROPONINI 1.62* 1.64* 1.66*    BNP: No results for input(s): PROBNP in the last 8760 hours.  Coagulation:  Recent Labs Lab 12/19/14 0735  INR 1.43    ECG:   Medications:   Scheduled Medications: . aspirin EC  325 mg Oral Daily  . feeding supplement (RESOURCE BREEZE)  1 Container Oral TID  BM  . gabapentin  900 mg Oral TID  . ipratropium-albuterol  3 mL Nebulization Q4H  . Linaclotide  145 mcg Oral Daily  . piperacillin-tazobactam (ZOSYN)  IV  3.375 g Intravenous Q8H  . vancomycin  500 mg Intravenous Q12H     Infusions: . sodium chloride 50 mL/hr at 12/20/14 0600  . DOPamine 7 mcg/kg/min (12/20/14 0600)  . heparin 850 Units/hr (12/20/14 0723)     PRN Medications:  acetaminophen **OR** acetaminophen, albuterol, alum & mag hydroxide-simeth, HYDROcodone-acetaminophen, HYDROmorphone (DILAUDID) injection, ondansetron **OR** ondansetron (ZOFRAN) IV     Assessment/Plan    1. Diverticulitis with colonic abscess - management per primary team, repeat CT shows improvement  2. Hypotension - unclear etiology. Echo showed normal LV function. Subtle RV changes however CT PE is negative. IVC is dilated suggesting elevated RA pressures/CVP - bp improved with IVF and low dose dopamine. Once transferred considered central line with CVP monitoring and venous O2 sat.   3. Elevated tropoinin - peak troponin of 1.9, started to trend down however slight uptrend once it got to 1.6. Occurred in the setting of severe hypotension, unclear if obstructive CAD or due to  drop in coronary perfusion pressures from hypotension - very atypical chest pain. Sharp constant chest throughout chest, constant for several hours, worst with position and deep breaths - EKG without acute ischemic changes. Echo shows normal LV function. CT PE negative - she has been started on heparin gtt. Thought most likely PE on initial presentation however CT PE negative, will need consideration for cath. Wil ask primary team to help arrange transfer to medicine team at [facility] due to multiple medical issues, with cards to follow and plan for cath.  - continue ASA, hep. Will start high dose statin. Low bp prohibits beta blocker or ACE-I  4. Hypokalemia - replacement per primary team       Carlyle Dolly, M.D.,

## 2014-12-20 NOTE — Progress Notes (Signed)
ANTICOAGULATION CONSULT NOTE - Follow Up Consult  Pharmacy Consult for Heparin Indication: chest pain/ACS  Allergies  Allergen Reactions  . Codeine Other (See Comments)    Makes patient not in right state of mind.   . Oxycodone Itching  . Talwin [Pentazocine] Other (See Comments)    Makes patient not in right state of mind.   . Valium [Diazepam] Other (See Comments)    Just doesn't work.     Patient Measurements: Height: 5\' 3"  (160 cm) Weight: 120 lb 5.9 oz (54.6 kg) IBW/kg (Calculated) : 52.4 Heparin Dosing Weight: 54.6 kg  Vital Signs: Temp: 98.7 F (37.1 C) (04/20 1500) Temp Source: Oral (04/20 1500) BP: 95/47 mmHg (04/20 1830) Pulse Rate: 93 (04/20 1830)  Labs:  Recent Labs  12/19/14 0510 12/19/14 0735  12/19/14 1752 12/19/14 2327 12/20/14 0450 12/20/14 1835 12/20/14 2025  HGB 10.3*  --   --   --   --  10.9* 10.6*  --   HCT 34.1*  --   --   --   --  36.0 33.6*  --   PLT 188  --   --   --   --  222 188  --   APTT  --  46*  --   --   --   --   --   --   LABPROT  --  17.6*  --   --   --   --   --   --   INR  --  1.43  --   --   --   --   --   --   HEPARINUNFRC  --   --   --  0.29*  --  0.13*  --  0.52  CREATININE 1.12*  --   --   --   --  0.80 0.74  --   TROPONINI 1.91*  --   < > 1.64* 1.66*  --  0.78*  --   < > = values in this interval not displayed.  Estimated Creatinine Clearance: 53.4 mL/min (by C-G formula based on Cr of 0.74).   Assessment: 74 y/o F transferred from [facility] on treatment for diverticular abscess. Patient developed atypical CP and placed on IV heparin.  Anticoag: CP. +troponins. Hgb down to 10.6 (baseline 13.2). Heparin level 0.52 now in goal range.  Goal of Therapy:  Heparin level 0.3-0.7 units/ml Monitor platelets by anticoagulation protocol: Yes   Plan:  Continue IV heparin at 850 units/hr [name] and CBC  Lindsay Mitchell S. Lindsay Mitchell, PharmD, BCPS Clinical Staff Pharmacist Pager [phone]  [place], [place] 12/20/2014,9:00 PM

## 2014-12-21 ENCOUNTER — Encounter (HOSPITAL_COMMUNITY)
Admission: EM | Disposition: A | Discharge status: Home Health | Payer: Self-pay | Source: Home / Self Care | Attending: Internal Medicine

## 2014-12-21 DIAGNOSIS — R918 Other nonspecific abnormal finding of lung field: Secondary | ICD-10-CM

## 2014-12-21 DIAGNOSIS — I25110 Atherosclerotic heart disease of native coronary artery with unstable angina pectoris: Secondary | ICD-10-CM

## 2014-12-21 DIAGNOSIS — K5792 Diverticulitis of intestine, part unspecified, without perforation or abscess without bleeding: Secondary | ICD-10-CM

## 2014-12-21 HISTORY — PX: LEFT HEART CATHETERIZATION WITH CORONARY ANGIOGRAM: SHX5451

## 2014-12-21 LAB — MAGNESIUM: Magnesium: 1.9 mg/dL (ref 1.5–2.5)

## 2014-12-21 LAB — BASIC METABOLIC PANEL
Anion gap: 5 (ref 5–15)
BUN: <5 mg/dL — ABNORMAL LOW (ref 6–23)
CO2: 31 mmol/L (ref 19–32)
Calcium: 8 mg/dL — ABNORMAL LOW (ref 8.4–10.5)
Chloride: 105 mmol/L (ref 96–112)
Creatinine, Ser: 0.77 mg/dL (ref 0.50–1.10)
GFR calc Af Amer: >90 mL/min (ref >90)
GFR calc non Af Amer: 83 mL/min — ABNORMAL LOW (ref >90)
Glucose, Bld: 78 mg/dL (ref 70–99)
Potassium: 4.7 mmol/L (ref 3.5–5.1)
Sodium: 141 mmol/L (ref 135–145)

## 2014-12-21 LAB — COMPREHENSIVE METABOLIC PANEL
ALT: 7 U/L (ref 0–35)
AST: 19 U/L (ref 0–37)
Albumin: 1.7 g/dL — ABNORMAL LOW (ref 3.5–5.2)
Alkaline Phosphatase: 43 U/L (ref 39–117)
Anion gap: 4 — ABNORMAL LOW (ref 5–15)
BUN: <5 mg/dL — ABNORMAL LOW (ref 6–23)
CO2: 27 mmol/L (ref 19–32)
Calcium: 6.5 mg/dL — ABNORMAL LOW (ref 8.4–10.5)
Chloride: 110 mmol/L (ref 96–112)
Creatinine, Ser: 0.58 mg/dL (ref 0.50–1.10)
GFR calc Af Amer: >90 mL/min (ref >90)
GFR calc non Af Amer: >90 mL/min (ref >90)
Glucose, Bld: 111 mg/dL — ABNORMAL HIGH (ref 70–99)
Potassium: 3 mmol/L — ABNORMAL LOW (ref 3.5–5.1)
Sodium: 141 mmol/L (ref 135–145)
Total Bilirubin: 2.3 mg/dL — ABNORMAL HIGH (ref 0.3–1.2)
Total Protein: 3.5 g/dL — ABNORMAL LOW (ref 6.0–8.3)

## 2014-12-21 LAB — IRON AND TIBC
Iron: 21 ug/dL — ABNORMAL LOW (ref 42–145)
Saturation Ratios: 11 % — ABNORMAL LOW (ref 20–55)
TIBC: 183 ug/dL — ABNORMAL LOW (ref 250–470)
UIBC: 162 ug/dL (ref 125–400)

## 2014-12-21 LAB — TROPONIN I: Troponin I: 0.49 ng/mL — ABNORMAL HIGH (ref <0.031)

## 2014-12-21 LAB — CBC
HCT: 32.5 % — ABNORMAL LOW (ref 36.0–46.0)
Hemoglobin: 10 g/dL — ABNORMAL LOW (ref 12.0–15.0)
MCH: 30.1 pg (ref 26.0–34.0)
MCHC: 30.8 g/dL (ref 30.0–36.0)
MCV: 97.9 fL (ref 78.0–100.0)
Platelets: 231 10*3/uL (ref 150–400)
RBC: 3.32 MIL/uL — ABNORMAL LOW (ref 3.87–5.11)
RDW: 13.5 % (ref 11.5–15.5)
WBC: 5.8 10*3/uL (ref 4.0–10.5)

## 2014-12-21 LAB — PHOSPHORUS: Phosphorus: 1.5 mg/dL — ABNORMAL LOW (ref 2.3–4.6)

## 2014-12-21 LAB — FERRITIN: Ferritin: 141 ng/mL (ref 10–291)

## 2014-12-21 LAB — PROTIME-INR
INR: 1.21 (ref 0.00–1.49)
Prothrombin Time: 15.4 seconds — ABNORMAL HIGH (ref 11.6–15.2)

## 2014-12-21 LAB — VITAMIN B12: Vitamin B-12: 672 pg/mL (ref 211–911)

## 2014-12-21 LAB — PROCALCITONIN: Procalcitonin: 0.16 ng/mL

## 2014-12-21 LAB — HEPARIN LEVEL (UNFRACTIONATED): Heparin Unfractionated: 0.15 IU/mL — ABNORMAL LOW (ref 0.30–0.70)

## 2014-12-21 LAB — CLOSTRIDIUM DIFFICILE BY PCR: Toxigenic C. Difficile by PCR: POSITIVE — AB

## 2014-12-21 LAB — CORTISOL-AM, BLOOD: Cortisol - AM: 15.6 ug/dL (ref 4.3–22.4)

## 2014-12-21 LAB — FOLATE: Folate: 4.6 ng/mL

## 2014-12-21 SURGERY — LEFT HEART CATHETERIZATION WITH CORONARY ANGIOGRAM
Anesthesia: LOCAL

## 2014-12-21 MED ORDER — HEPARIN SODIUM (PORCINE) 5000 UNIT/ML IJ SOLN
5000.0000 [IU] | Freq: Three times a day (TID) | INTRAMUSCULAR | Status: DC
  Administered 2014-12-21 – 2014-12-28 (×20): 5000 [IU] via SUBCUTANEOUS
  Filled 2014-12-21 (×24): qty 1

## 2014-12-21 MED ORDER — METRONIDAZOLE 500 MG PO TABS
500.0000 mg | ORAL_TABLET | Freq: Three times a day (TID) | ORAL | Status: DC
  Administered 2014-12-21 – 2014-12-28 (×21): 500 mg via ORAL
  Filled 2014-12-21 (×25): qty 1

## 2014-12-21 MED ORDER — SODIUM CHLORIDE 0.9 % IV SOLN
INTRAVENOUS | Status: AC
  Administered 2014-12-21: 13:00:00 via INTRAVENOUS

## 2014-12-21 MED ORDER — SODIUM CHLORIDE 0.9 % IJ SOLN
3.0000 mL | Freq: Two times a day (BID) | INTRAMUSCULAR | Status: DC
  Administered 2014-12-22 – 2014-12-27 (×7): 3 mL via INTRAVENOUS

## 2014-12-21 MED ORDER — HEPARIN (PORCINE) IN NACL 100-0.45 UNIT/ML-% IJ SOLN
1050.0000 [IU]/h | INTRAMUSCULAR | Status: DC
  Filled 2014-12-21: qty 250

## 2014-12-21 MED ORDER — POTASSIUM CHLORIDE CRYS ER 10 MEQ PO TBCR
30.0000 meq | EXTENDED_RELEASE_TABLET | ORAL | Status: AC
  Administered 2014-12-21 (×2): 30 meq via ORAL
  Filled 2014-12-21 (×4): qty 1

## 2014-12-21 MED ORDER — SODIUM CHLORIDE 0.9 % IV SOLN: 250.0000 mL | INTRAVENOUS | Status: DC | PRN

## 2014-12-21 MED ORDER — SODIUM CHLORIDE 0.9 % IJ SOLN: 3.0000 mL | INTRAMUSCULAR | Status: DC | PRN

## 2014-12-21 MED ORDER — VERAPAMIL HCL 2.5 MG/ML IV SOLN
INTRAVENOUS | Status: AC
Start: 2014-12-21 — End: 2014-12-21
  Filled 2014-12-21: qty 2

## 2014-12-21 MED ORDER — HEPARIN SODIUM (PORCINE) 1000 UNIT/ML IJ SOLN
INTRAMUSCULAR | Status: AC
  Filled 2014-12-21: qty 1

## 2014-12-21 MED ORDER — ASPIRIN 81 MG PO CHEW
81.0000 mg | CHEWABLE_TABLET | Freq: Every day | ORAL | Status: DC
Start: 2014-12-22 — End: 2014-12-28
  Administered 2014-12-22 – 2014-12-28 (×7): 81 mg via ORAL
  Filled 2014-12-21 (×7): qty 1

## 2014-12-21 MED ORDER — NITROGLYCERIN 1 MG/10 ML FOR IR/CATH LAB
INTRA_ARTERIAL | Status: AC
  Filled 2014-12-21: qty 10

## 2014-12-21 MED ORDER — SODIUM CHLORIDE 0.9 % IJ SOLN
3.0000 mL | Freq: Two times a day (BID) | INTRAMUSCULAR | Status: DC
  Administered 2014-12-21: 3 mL via INTRAVENOUS

## 2014-12-21 MED ORDER — FENTANYL CITRATE (PF) 100 MCG/2ML IJ SOLN
INTRAMUSCULAR | Status: AC
  Filled 2014-12-21: qty 2

## 2014-12-21 MED ORDER — SODIUM CHLORIDE 0.9 % IV SOLN
INTRAVENOUS | Status: DC
  Administered 2014-12-21: 10:00:00 via INTRAVENOUS

## 2014-12-21 MED ORDER — HEPARIN (PORCINE) IN NACL 2-0.9 UNIT/ML-% IJ SOLN
INTRAMUSCULAR | Status: AC
  Filled 2014-12-21: qty 1500

## 2014-12-21 MED ORDER — LIDOCAINE HCL (PF) 1 % IJ SOLN
INTRAMUSCULAR | Status: AC
Start: 2014-12-21 — End: 2014-12-21
  Filled 2014-12-21: qty 30

## 2014-12-21 MED ORDER — ASPIRIN 81 MG PO CHEW: 81.0000 mg | CHEWABLE_TABLET | ORAL | Status: DC

## 2014-12-21 NOTE — Progress Notes (Signed)
ANTICOAGULATION CONSULT NOTE - Follow Up Consult  Pharmacy Consult for Heparin Indication: chest pain/ACS  Allergies  Allergen Reactions  . Codeine Other (See Comments)    Makes patient not in right state of mind.   . Oxycodone Itching  . Talwin [Pentazocine] Other (See Comments)    Makes patient not in right state of mind.   . Valium [Diazepam] Other (See Comments)    Just doesn't work.     Patient Measurements: Height: 5\' 3"  (160 cm) Weight: 125 lb 14.1 oz (57.1 kg) IBW/kg (Calculated) : 52.4 Heparin Dosing Weight: 54.6 kg  Vital Signs: Temp: 98.3 F (36.8 C) (04/21 0000) Temp Source: Oral (04/21 0000) BP: 103/57 mmHg (04/21 0500) Pulse Rate: 78 (04/21 0500)  Labs:  Recent Labs  12/19/14 0510 12/19/14 0735  12/19/14 1752 12/19/14 2327 12/20/14 0450 12/20/14 1835 12/20/14 2025 12/21/14 0440  HGB 10.3*  --   --   --   --  10.9* 10.6*  --   --   HCT 34.1*  --   --   --   --  36.0 33.6*  --   --   PLT 188  --   --   --   --  222 188  --   --   APTT  --  46*  --   --   --   --   --   --   --   LABPROT  --  17.6*  --   --   --   --   --   --   --   INR  --  1.43  --   --   --   --   --   --   --   HEPARINUNFRC  --   --   < > 0.29*  --  0.13*  --  0.52 0.15*  CREATININE 1.12*  --   --   --   --  0.80 0.74  --   --   TROPONINI 1.91*  --   < > 1.64* 1.66*  --  0.78*  --   --   < > = values in this interval not displayed.  Estimated Creatinine Clearance: 53.4 mL/min (by C-G formula based on Cr of 0.74).   Assessment: 74 y/o female on IV heparin 850 units/hr. AM heparin level is 0.15.  RN reports she drew the heparin level from PICC line and the heparin is infusing at a peripheral site.  RN noted that last nights lab work had some high results that were incorrect when redrawn.  It may be that last night's HL of 0.52 was incorrect.  No bleeding reported. Troponin decreased to 0.49.  CBC pending.  Pt was transferred from [facility] on treatment for diverticular abscess.  Patient developed atypical CP and placed on IV heparin.   Goal of Therapy:  Heparin level 0.3-0.7 units/ml Monitor platelets by anticoagulation protocol: Yes   Plan:  Increase IV heparin drip to 1050 units/hr Check 6 hour heparin level. [name] and CBC  Nicole Cella, RPh Clinical Pharmacist Pager: [phone] 12/21/2014,5:48 AM

## 2014-12-21 NOTE — Progress Notes (Signed)
[facility] Progress Note     Subjective: Pt denies much pain. No N/V, was toelrating a HH diet yesterday.  Says she's a little SOB, mild CP.  Pending heart cath today.  Objective: Vital signs in last 24 hours: Temp:  [97.8 F (36.6 C)-98.7 F (37.1 C)] 98.3 F (36.8 C) (04/21 0000) Pulse Rate:  [56-125] 78 (04/21 0500) Resp:  [11-21] 14 (04/21 0500) BP: (59-160)/(32-99) 103/57 mmHg (04/21 0500) SpO2:  [85 %-98 %] 91 % (04/21 0500) Weight:  [57.1 kg (125 lb 14.1 oz)] 57.1 kg (125 lb 14.1 oz) (04/20 2218) Last BM Date: 12/20/14  Intake/Output from previous day: 04/20 0701 - 04/21 0700 In: 1911.8 [P.O.:120; I.V.:1429.3; IV Piggyback:362.5] Out: 675 [Urine:675] Intake/Output this shift:    PE: Gen:  Alert, NAD, pleasant Abd: Soft, minimal tenderness, ND, +BS, no HSM   Lab Results:   Recent Labs  12/20/14 0450 12/20/14 1835  WBC 10.0 8.0  HGB 10.9* 10.6*  HCT 36.0 33.6*  PLT 222 188   BMET  Recent Labs  12/20/14 1835 12/21/14 0440  NA 138 141  K 3.2* 3.0*  CL 104 110  CO2 28 27  GLUCOSE 135* 111*  BUN <5* <5*  CREATININE 0.74 0.58  CALCIUM 7.5* 6.5*   PT/INR  Recent Labs  12/19/14 0735  LABPROT 17.6*  INR 1.43   CMP     Component Value Date/Time   NA 141 12/21/2014 0440   NA 142 10/31/2014 1447   K 3.0* 12/21/2014 0440   CL 110 12/21/2014 0440   CO2 27 12/21/2014 0440   GLUCOSE 111* 12/21/2014 0440   GLUCOSE 78 10/31/2014 1447   BUN <5* 12/21/2014 0440   BUN 24 10/31/2014 1447   CREATININE 0.58 12/21/2014 0440   CREATININE 1.05 01/10/2013 1748   CALCIUM 6.5* 12/21/2014 0440   PROT 3.5* 12/21/2014 0440   PROT 6.4 10/31/2014 1447   ALBUMIN 1.7* 12/21/2014 0440   AST 19 12/21/2014 0440   ALT 7 12/21/2014 0440   ALKPHOS 43 12/21/2014 0440   BILITOT 2.3* 12/21/2014 0440   BILITOT <0.2 10/31/2014 1447   GFRNONAA >90 12/21/2014 0440   GFRNONAA 54* 01/10/2013 1748   GFRAA >90 12/21/2014 0440   GFRAA 63 01/10/2013 1748    Lipase     Component Value Date/Time   LIPASE 31 12/15/2014 1514       Studies/Results: Ct Angio Chest Pe W/cm &/or Wo Cm  12/19/2014   CLINICAL DATA:  Shortness of breath. Recent surgery for diverticulitis. Chest pain started last night.  EXAM: CT ANGIOGRAPHY CHEST WITH CONTRAST  TECHNIQUE: Multidetector CT imaging of the chest was performed using the standard protocol during bolus administration of intravenous contrast. Multiplanar CT image reconstructions and MIPs were obtained to evaluate the vascular anatomy.  CONTRAST:  27mL OMNIPAQUE IOHEXOL 350 MG/ML SOLN  COMPARISON:  None.  FINDINGS: There is adequate opacification of the pulmonary arteries. There is no pulmonary embolus. The main pulmonary artery, right main pulmonary artery and left main pulmonary arteries are normal in size. The heart size is mildly enlarged. There is no pericardial effusion. There is coronary artery atherosclerosis in the LAD, circumflex and RCA.  There are bilateral small pleural effusions. There is bilateral compressive atelectasis, left greater than right. There is bilateral centrilobular emphysema. There is no pneumothorax.  There is no axillary, hilar, or mediastinal adenopathy.  There is no lytic or blastic osseous lesion.  The visualized portions of the upper abdomen are unremarkable.  Review of  the MIP images confirms the above findings.  IMPRESSION: 1. No evidence pulmonary embolus. 2. Bilateral small pleural effusions and compressive atelectasis. 3. Bilateral centrilobular emphysema.   Electronically Signed   By: Kathreen Devoid   On: 12/19/2014 16:17   Ct Abdomen Pelvis W Contrast  12/19/2014   CLINICAL DATA:  Follow-up diverticular abscess.  Hypotension.  EXAM: CT ABDOMEN AND PELVIS WITH CONTRAST  TECHNIQUE: Multidetector CT imaging of the abdomen and pelvis was performed using the standard protocol following bolus administration of intravenous contrast.  CONTRAST:  116mL OMNIPAQUE IOHEXOL 300 MG/ML  SOLN   COMPARISON:  CT abdomen pelvis 12/13/2011  FINDINGS: Interval resolution of rim enhancing fluid collection posterior to the sigmoid colon compatible with resolution of abscess. Sigmoid diverticular change noted in this area with some thickening of the bowel which also has improved. Findings are consistent with resolving diverticulitis and abscess. No abscess or free fluid on today's study. Negative for bowel obstruction.  Small bilateral pleural effusions with bibasilar atelectasis have developed since the prior study  Focal fatty infiltration of the left lobe of the liver adjacent to the falciform ligament unchanged. Pericholecystic fluid has developed since the prior study however the gallbladder wall is not thickened and no gallstones identified. Correlate with right upper quadrant pain and ultrasound if clinically indicated. Spleen is normal. Pancreas is atrophic without edema or mass  Negative for renal obstruction or mass. Multiple renal cysts. Largest cyst left lower pole measuring 7 cm  Atherosclerotic aorta without aneurysm. Prior hysterectomy. No adenopathy.  Moderate disc degeneration and spurring in the lower lumbar spine. Negative for fracture  IMPRESSION: Improvement in diverticulitis. Resolution of diverticular abscess in the sigmoid region.  Interval development of pericholecystic fluid. No gallbladder thickening or gallstones. Correlate with pain in this area and possible ultrasound if indicated  Interval development of bilateral pleural effusions and bibasilar atelectasis.   Electronically Signed   By: Franchot Gallo M.D.   On: 12/19/2014 11:28    Anti-infectives: Anti-infectives    Start     Dose/Rate Route Frequency Ordered Stop   12/19/14 2100  vancomycin (VANCOCIN) 500 mg in sodium chloride 0.9 % 100 mL IVPB     500 mg 100 mL/hr over 60 Minutes Intravenous Every 12 hours 12/19/14 0934     12/19/14 1600  piperacillin-tazobactam (ZOSYN) IVPB 3.375 g  Status:  Discontinued     3.375  g 12.5 mL/hr over 240 Minutes Intravenous Every 8 hours 12/19/14 0805 12/19/14 0923   12/19/14 1000  piperacillin-tazobactam (ZOSYN) IVPB 3.375 g     3.375 g 12.5 mL/hr over 240 Minutes Intravenous Every 8 hours 12/19/14 0923     12/19/14 0900  vancomycin (VANCOCIN) 1,250 mg in sodium chloride 0.9 % 250 mL IVPB     1,250 mg 166.7 mL/hr over 90 Minutes Intravenous  Once 12/19/14 0804 12/19/14 1029   12/19/14 0700  piperacillin-tazobactam (ZOSYN) IVPB 3.375 g  Status:  Discontinued     3.375 g 100 mL/hr over 30 Minutes Intravenous  Once 12/19/14 0659 12/19/14 0924   12/16/14 0400  ciprofloxacin (CIPRO) IVPB 400 mg  Status:  Discontinued     400 mg 200 mL/hr over 60 Minutes Intravenous Every 12 hours 12/15/14 1837 12/19/14 0659   12/16/14 0100  metroNIDAZOLE (FLAGYL) IVPB 500 mg  Status:  Discontinued     500 mg 100 mL/hr over 60 Minutes Intravenous Every 8 hours 12/15/14 1837 12/19/14 0659   12/15/14 1515  metroNIDAZOLE (FLAGYL) IVPB 500 mg  500 mg 100 mL/hr over 60 Minutes Intravenous  Once 12/15/14 1506 12/15/14 1733   12/15/14 1515  ciprofloxacin (CIPRO) IVPB 400 mg     400 mg 200 mL/hr over 60 Minutes Intravenous  Once 12/15/14 1506 12/15/14 1631       Assessment/Plan Acute diverticulitis with abscess (Transfer from [facility]) -repeat CT scan shows improving diverticulitis and resolution of abscess -resume low residue/heart healthy diet after cath -no acute surgical intervention needed as she is improving -she has had a total of 7 days of abx therapy. She does not need any further abx therapy from our standpoint Elevated troponin's -Getting cath COPD Hypokalemia  -per primary    LOS: 6 days    [place], [facility] 12/21/2014, 7:38 AM Pager: [phone]

## 2014-12-21 NOTE — CV Procedure (Signed)
     Left Heart Catheterization with Coronary Angiography  Report  Lindsay Mitchell  74 y.o.  female Jun 12, 1941  Procedure Date: 12/21/2014 Referring Physician: Fransico Him, M.D. Primary Cardiologist: Fransico Him, M.D.  INDICATIONS: ACS  PROCEDURE: 1. Left heart catheterization; 2. Coronary angiography; 3. Left ventriculography  CONSENT:  The risks, benefits, and details of the procedure were explained in detail to the patient. Risks including death, stroke, heart attack, kidney injury, allergy, limb ischemia, bleeding and radiation injury were discussed.  The patient verbalized understanding and wanted to proceed.  Informed written consent was obtained.  PROCEDURE TECHNIQUE:  After Xylocaine anesthesia a 5 French Slender sheath was placed in the right radial artery with an angiocath and the modified Seldinger technique.  Coronary angiography was done using a 5 F JR4 catheter.  Left ventriculography was done using the JR 4 catheter and hand injection.   Images were reviewed and the case was terminated. Hemostasis was achieved in the right radial access site using a wrist band with 10 cc of air.   CONTRAST:  Total of 90 cc.  COMPLICATIONS:  None   HEMODYNAMICS:  Aortic pressure 114/62 mmHg; LV pressure 1:15/7 mmHg; LVEDP 14 mmHg  ANGIOGRAPHIC DATA:   The left main coronary artery is calcified but widely patent.  The left anterior descending artery is widely patent and wraps around the left ventricular apex. The proximal segment has heavy calcification. Within the proximal calcified segment there is ectasia. No obstruction is noted. Tortuosity is noted in the mid and distal LAD beyond the diagonal. No obstruction is seen..  The left circumflex artery is moderately calcified. 4 obtuse marginals arise from the circumflex. No significant obstruction is seen..  The right coronary artery is large vessel giving origin to a tortuous PDA. The proximal RCA contains 40% narrowing.  Irregularities are noted in the mid vessel.Marland Kitchen  LEFT VENTRICULOGRAM:  Left ventricular angiogram was done in the 30 RAO projection and revealed normal sized LV cavity with an EF of 55%.   IMPRESSIONS:  1. Moderate to heavy three-vessel coronary calcification  2. No significant obstructive coronary disease is noted. There is an eccentric 40% proximal RCA.  3. Overall normal LV function with EF 55% and no regional wall motion abnormality   RECOMMENDATION:  Further management per treating team.  The discontinue IV heparin and start subcutaneous heparin for DVT prophylaxis.

## 2014-12-21 NOTE — Interval H&P Note (Signed)
Cath Lab Visit (complete for each Cath Lab visit)  Clinical Evaluation Leading to the Procedure:   ACS: Yes.    Non-ACS:    Anginal Classification: CCS III  Anti-ischemic medical therapy: Minimal Therapy (1 class of medications)  Non-Invasive Test Results: No non-invasive testing performed  Prior CABG: No previous CABG      History and Physical Interval Note:  12/21/2014 11:52 AM  Lindsay Mitchell  has presented today for surgery, with the diagnosis of NSTEMI  The various methods of treatment have been discussed with the patient and family. After consideration of risks, benefits and other options for treatment, the patient has consented to  Procedure(s): LEFT HEART CATHETERIZATION WITH CORONARY ANGIOGRAM (N/A) as a surgical intervention .  The patient's history has been reviewed, patient examined, no change in status, stable for surgery.  I have reviewed the patient's chart and labs.  Questions were answered to the patient's satisfaction.     Sinclair Grooms

## 2014-12-21 NOTE — Progress Notes (Signed)
[facility] Progress Note Patient Name: Lindsay Mitchell DOB: 1941-04-07 MRN: [id number]   Date of Service  12/21/2014  HPI/Events of Note  Patient is C. Difficile Toxin positive. Already on contact isolation for C. Difficile.   eICU Interventions  Will start Flagyl 500 mg PO now and Q 8 hours.      Intervention Category Intermediate Interventions: Diagnostic test evaluation  Ellan Tess Eugene 12/21/2014, 4:08 PM

## 2014-12-21 NOTE — Progress Notes (Signed)
[place] ICU Electrolyte Replacement Protocol  Patient Name: Lindsay Mitchell DOB: 04-19-1941 MRN: [id number]  Date of Service  12/21/2014   HPI/Events of Note    Recent Labs Lab 12/16/14 0605 12/19/14 0510 12/20/14 0450 12/20/14 1116 12/20/14 1835 12/20/14 2205 12/21/14 0440  NA 139 141 142  --  138  --  141  K 3.6 3.4* 3.1*  --  3.2*  --  3.0*  CL 103 106 106  --  104  --  110  CO2 _0 --  28  --  27  GLUCOSE 125* 116* 108*  --  135*  --  111*  BUN 12 5* 6  --  <5*  --  <5*  CREATININE 0.79 1.12* 0.80  --  0.74  --  0.58  CALCIUM 8.3* 7.9* 8.3*  --  7.5*  --  6.5*  MG  --   --   --  1.8 10.4* 2.6* 1.9  PHOS  --   --   --   --  1.6*  --  1.5*    Estimated Creatinine Clearance: 53.4 mL/min (by C-G formula based on Cr of 0.58).  Intake/Output      04/20 0701 - 04/21 0700   P.O. 120   I.V. (mL/kg) 1429.3 (25)   IV Piggyback 362.5   Total Intake(mL/kg) 1911.8 (33.5)   Urine (mL/kg/hr) 675 (0.5)   Total Output 675   Net +1236.8       Urine Occurrence 2 x   Stool Occurrence 2 x    - I/O DETAILED x24h    Total I/O In: 1164.1 [I.V.:1014.1; IV Piggyback:150] Out: -  - I/O THIS SHIFT    ASSESSMENT   eICURN Interventions   Electrolyte protocol criteria met. Lab values replaced per protocol. MD notified.   ASSESSMENT: MAJOR ELECTROLYTE    Lorene Dy 12/21/2014, 6:32 AM

## 2014-12-21 NOTE — Progress Notes (Signed)
Results of cath reviewed.  3 vessel severe coronary artery calcifications without any significant obstructive disease.  Now off IV Heparin.  Normal LVF by echo and cath.  NO further cardiac workup.  Continue ASA/statin.  Needs aggressive risk factor modification.  Please have patient followup with Dr. Harl Bowie in [place].  Will sign off.

## 2014-12-21 NOTE — Progress Notes (Addendum)
PULMONARY / CRITICAL CARE MEDICINE   Name: Lindsay Mitchell MRN: [id number] DOB: 1941/03/12    ADMISSION DATE:  12/15/2014 CONSULTATION DATE:  4/20   REFERRING MD :  Dr. Harl Bowie (cardiology)   CHIEF COMPLAINT:  Hypotension   INITIAL PRESENTATION: 74yo female smoker with hx severe COPD, diverticulosis, fibromyalgia, hx DVT initially admitted 4/15 to [facility] with diverticulitis and contained abd abscess.  She was treated with bowel rest and IV abx and followed by surgery although no surgical intervention was needed.  On 4/19 she developed SOB, chest pain and hypotension.  CTA chest was neg for PE and her troponin was elevated at 1.9.  She was seen in consultation by cardiology who requested tx to [facility] for possible cath and PCCM asked to admit r/t hypotension and pressor needs.   STUDIES:  CT abd/pelvis 4/19>>> improvement in diverticulitis, resolution of diverticular abscess. Interval development of pericholecystic fluid, no gallstones or gallbladder thickening  CTA chest 4/19>>> neg PE, small bilat effusions  2D echo 4/19>>> EF 60-65%, mod dilated RV, PA peak pressure 25mmHg,   SUBJECTIVE:  12/21/2014 Cath mainly heavy calcification. OFf pressors. Having diarrhea due to diverticulitis but stool sent. L shoulder pain gone. CCS following -recommended coatninue antibiotuics due to LLQ pain. HAs PIC line placed  VITAL SIGNS: Temp:  [97.8 F (36.6 C)-98.7 F (37.1 C)] 97.8 F (36.6 C) (04/21 1330) Pulse Rate:  [56-122] 74 (04/21 1400) Resp:  [11-22] 16 (04/21 1400) BP: (71-160)/(32-98) 124/68 mmHg (04/21 1400) SpO2:  [88 %-97 %] 94 % (04/21 1400) Weight:  [57.1 kg (125 lb 14.1 oz)] 57.1 kg (125 lb 14.1 oz) (04/20 2218) HEMODYNAMICS:   VENTILATOR SETTINGS:   INTAKE / OUTPUT:  Intake/Output Summary (Last 24 hours) at 12/21/14 1435 Last data filed at 12/21/14 1400  Gross per 24 hour  Intake 1928.55 ml  Output    675 ml  Net 1253.55 ml    PHYSICAL EXAMINATION: General:   Pleasant female, NAD  Neuro:  Awake, alert, appropriate, MAE  HEENT:  Mm dry, no JVD  Cardiovascular:  s1s2 rrr, great radial pulse  Lungs:  resps even, non labored on [place], few exp wheeze  Abdomen:  Soft, mildly tender diffusely  Musculoskeletal:  Warm and dry, no edema   LABS: PULMONARY No results for input(s): PHART, PCO2ART, PO2ART, HCO3, TCO2, O2SAT in the last 168 hours.  Invalid input(s): PCO2, PO2  CBC  Recent Labs Lab 12/20/14 0450 12/20/14 1835 12/21/14 1120  HGB 10.9* 10.6* 10.0*  HCT 36.0 33.6* 32.5*  WBC 10.0 8.0 5.8  PLT 222 188 231    COAGULATION  Recent Labs Lab 12/19/14 0735 12/21/14 1120  INR 1.43 1.21    CARDIAC   Recent Labs Lab 12/19/14 1200 12/19/14 1752 12/19/14 2327 12/20/14 1835 12/21/14 0440  TROPONINI 1.62* 1.64* 1.66* 0.78* 0.49*   No results for input(s): PROBNP in the last 168 hours.   CHEMISTRY  Recent Labs Lab 12/19/14 0510 12/20/14 0450 12/20/14 1116 12/20/14 1835 12/20/14 2205 12/21/14 0440 12/21/14 1120  NA 141 142  --  138  --  141 141  K 3.4* 3.1*  --  3.2*  --  3.0* 4.7  CL 106 106  --  104  --  110 105  CO2 29 29  --  28  --  27 31  GLUCOSE 116* 108*  --  135*  --  111* 78  BUN 5* 6  --  <5*  --  <5* <5*  CREATININE 1.12*  0.80  --  0.74  --  0.58 0.77  CALCIUM 7.9* 8.3*  --  7.5*  --  6.5* 8.0*  MG  --   --  1.8 10.4* 2.6* 1.9  --   PHOS  --   --   --  1.6*  --  1.5*  --    Estimated Creatinine Clearance: 53.4 mL/min (by C-G formula based on Cr of 0.77).   LIVER  Recent Labs Lab 12/15/14 1514 12/19/14 0510 12/19/14 0735 12/21/14 0440 12/21/14 1120  AST 17 24  --  19  --   ALT 7 7  --  7  --   ALKPHOS 78 62  --  43  --   BILITOT 0.5 0.6  --  2.3*  --   PROT 6.5 4.8*  --  3.5*  --   ALBUMIN 3.2* 2.4*  --  1.7*  --   INR  --   --  1.43  --  1.21     INFECTIOUS  Recent Labs Lab 12/19/14 0510 12/19/14 0735 12/20/14 1835 12/21/14 0440  LATICACIDVEN 2.0 1.9 0.9  --   PROCALCITON  --   0.43 0.27 0.16     ENDOCRINE CBG (last 3)  No results for input(s): GLUCAP in the last 72 hours.       IMAGING x48h Ct Angio Chest Pe W/cm &/or Wo Cm  12/19/2014   CLINICAL DATA:  Shortness of breath. Recent surgery for diverticulitis. Chest pain started last night.  EXAM: CT ANGIOGRAPHY CHEST WITH CONTRAST  TECHNIQUE: Multidetector CT imaging of the chest was performed using the standard protocol during bolus administration of intravenous contrast. Multiplanar CT image reconstructions and MIPs were obtained to evaluate the vascular anatomy.  CONTRAST:  94mL OMNIPAQUE IOHEXOL 350 MG/ML SOLN  COMPARISON:  None.  FINDINGS: There is adequate opacification of the pulmonary arteries. There is no pulmonary embolus. The main pulmonary artery, right main pulmonary artery and left main pulmonary arteries are normal in size. The heart size is mildly enlarged. There is no pericardial effusion. There is coronary artery atherosclerosis in the LAD, circumflex and RCA.  There are bilateral small pleural effusions. There is bilateral compressive atelectasis, left greater than right. There is bilateral centrilobular emphysema. There is no pneumothorax.  There is no axillary, hilar, or mediastinal adenopathy.  There is no lytic or blastic osseous lesion.  The visualized portions of the upper abdomen are unremarkable.  Review of the MIP images confirms the above findings.  IMPRESSION: 1. No evidence pulmonary embolus. 2. Bilateral small pleural effusions and compressive atelectasis. 3. Bilateral centrilobular emphysema.   Electronically Signed   By: Kathreen Devoid   On: 12/19/2014 16:17       ASSESSMENT / PLAN:  PULMONARY Chronic respiratory failure  Severe COPD without acute exacerbation  tobacco abuse  Pulmonary nodules - known since 2014 P:   Supplemental O2 as needed  BD's - duonebs (home rx) F/u CXR  No need systemic steroids at this time  outpt pulm f/u  Ambulatory desat prior to d/c - may need  home O2   CARDIOVASCULAR Hypotension - likely cardiogenic given elevated troponin, chest pain and normal lactate, wbc and pct.  ??accuracy of BP readings (now SBP 70's) with great mentation, bounding radial pulse, asymptomatic.   Chest pain/Elevated troponin  CTA chest neg PE  Cath 12/21/2014 - heavy calcifcation. No intervention. OFf pressoprs. Pressor need was likely volume related  P:  Cont asa, Change heparin gtt too SQ heparin per  cards  Dc pressors   RENAL  Recent Labs Lab 12/19/14 0510 12/20/14 0450 12/20/14 1835 12/21/14 0440 12/21/14 1120  CREATININE 1.12* 0.80 0.74 0.58 0.77    AKI - improved /resolved  P:   F/u chem  Gentle volume     GASTROINTESTINAL Diverticulitis with colonic abscess - improved  Pericholecystic fluid  Severe protein calorie malnutrition    - having diarrhea - c diff sent. CCS recommending continuing Zosyn because of diarrhe and LLQ pain recurrence  P:   DC vanc Continue zosyn per CCS; need to establish stop date DC maalox    HEMATOLOGIC Anemia - mild  P:  Anemia panel pending   INFECTIOUS Diverticulitis with colonic abscess  P:   BCx2 4/20>>>  cipro 12/15/14>>4/19 Flagyl 12/15/14>>4/19 Vancomycin 4/19>>> Zosyn 4/19>>> (stop date to be established between [facility]ad and cccs)  ENDOCRINE No active issue  P:   Monitor   NEUROLOGIC Chronic pain P:   Supportive care  PRN pain rx  gabapeentin 900mg  tid  - home dose .  Dc opioids  FAMILY  - Updates:  Pt updated at bedside.  Move to SDU. [facility] primary from 12/22/14 d/w Dr Sherral Hammers. Might even be floor ready 12/22/14     Dr. Brand Males, M.D., F.C.C.P Pulmonary and Critical Care Medicine Staff Physician [facility] Pager: [phone], If no answer or between  15:00h - 7:00h: call [phone]  12/21/2014 2:35 PM

## 2014-12-21 NOTE — Progress Notes (Addendum)
SUBJECTIVE:  Complains of sharp CP and SOB this am  OBJECTIVE:   Vitals:   Filed Vitals:   12/21/14 0500 12/21/14 0600 12/21/14 0700 12/21/14 0800  BP: 103/57 97/60 127/65   Pulse: 78 76 81   Temp:    98 F (36.7 C)  TempSrc:    Oral  Resp: 14 19 14    Height:      Weight:      SpO2: 91% 97% 93%    I&O's:   Intake/Output Summary (Last 24 hours) at 12/21/14 0842 Last data filed at 12/21/14 0700  Gross per 24 hour  Intake 2050.81 ml  Output    675 ml  Net 1375.81 ml   TELEMETRY: Reviewed telemetry pt in NSR:     PHYSICAL EXAM General: Well developed, well nourished, in no acute distress Head: Eyes PERRLA, No xanthomas.   Normal cephalic and atramatic  Lungs:   Clear bilaterally to auscultation and percussion. Heart:   HRRR S1 S2 Pulses are 2+ & equal. Abdomen: Bowel sounds are positive, abdomen soft and non-tender without masses Extremities:   No clubbing, cyanosis or edema.  DP +1 Neuro: Alert and oriented X 3. Psych:  Good affect, responds appropriately   LABS: Basic Metabolic Panel:  Recent Labs  12/20/14 1835 12/20/14 2205 12/21/14 0440  NA 138  --  141  K 3.2*  --  3.0*  CL 104  --  110  CO2 28  --  27  GLUCOSE 135*  --  111*  BUN <5*  --  <5*  CREATININE 0.74  --  0.58  CALCIUM 7.5*  --  6.5*  MG 10.4* 2.6* 1.9  PHOS 1.6*  --  1.5*   Liver Function Tests:  Recent Labs  12/19/14 0510 12/21/14 0440  AST 24 19  ALT 7 7  ALKPHOS 62 43  BILITOT 0.6 2.3*  PROT 4.8* 3.5*  ALBUMIN 2.4* 1.7*   No results for input(s): LIPASE, AMYLASE in the last 72 hours. CBC:  Recent Labs  12/20/14 0450 12/20/14 1835  WBC 10.0 8.0  HGB 10.9* 10.6*  HCT 36.0 33.6*  MCV 98.6 96.8  PLT 222 188   Cardiac Enzymes:  Recent Labs  12/19/14 2327 12/20/14 1835 12/21/14 0440  TROPONINI 1.66* 0.78* 0.49*   BNP: Invalid input(s): POCBNP D-Dimer: No results for input(s): DDIMER in the last 72 hours. Hemoglobin A1C: No results for input(s): HGBA1C in  the last 72 hours. Fasting Lipid Panel: No results for input(s): CHOL, HDL, LDLCALC, TRIG, CHOLHDL, LDLDIRECT in the last 72 hours. Thyroid Function Tests: No results for input(s): TSH, T4TOTAL, T3FREE, THYROIDAB in the last 72 hours.  Invalid input(s): FREET3 Anemia Panel:  Recent Labs  12/20/14 1116  VITAMINB12 672  FOLATE 4.6  FERRITIN 141  TIBC 183*  IRON 21*  RETICCTPCT 1.5   Coag Panel:   Lab Results  Component Value Date   INR 1.43 12/19/2014    RADIOLOGY: Dg Chest 2 View  12/18/2014   CLINICAL DATA:  Left-sided rib pain and cough.  Shortness of breath.  EXAM: CHEST  2 VIEW  COMPARISON:  12/13/2014 abdominal CT, 04/28/2014 radiograph  FINDINGS: Small left pleural effusion and associated airspace opacity. Background interstitial coarsening and hyperinflation. Aortic atherosclerosis. Normal heart size. Osteopenia and multilevel degenerative changes.  IMPRESSION: Small left pleural effusion and associated airspace opacity; atelectasis versus pneumonia. Recommend a repeat radiograph in 3-4 weeks to document resolution.  Background COPD.   Electronically Signed   By: Mitzi Hansen  DelGaizo M.D.   On: 12/18/2014 06:53   Ct Angio Chest Pe W/cm &/or Wo Cm  12/19/2014   CLINICAL DATA:  Shortness of breath. Recent surgery for diverticulitis. Chest pain started last night.  EXAM: CT ANGIOGRAPHY CHEST WITH CONTRAST  TECHNIQUE: Multidetector CT imaging of the chest was performed using the standard protocol during bolus administration of intravenous contrast. Multiplanar CT image reconstructions and MIPs were obtained to evaluate the vascular anatomy.  CONTRAST:  49mL OMNIPAQUE IOHEXOL 350 MG/ML SOLN  COMPARISON:  None.  FINDINGS: There is adequate opacification of the pulmonary arteries. There is no pulmonary embolus. The main pulmonary artery, right main pulmonary artery and left main pulmonary arteries are normal in size. The heart size is mildly enlarged. There is no pericardial effusion. There  is coronary artery atherosclerosis in the LAD, circumflex and RCA.  There are bilateral small pleural effusions. There is bilateral compressive atelectasis, left greater than right. There is bilateral centrilobular emphysema. There is no pneumothorax.  There is no axillary, hilar, or mediastinal adenopathy.  There is no lytic or blastic osseous lesion.  The visualized portions of the upper abdomen are unremarkable.  Review of the MIP images confirms the above findings.  IMPRESSION: 1. No evidence pulmonary embolus. 2. Bilateral small pleural effusions and compressive atelectasis. 3. Bilateral centrilobular emphysema.   Electronically Signed   By: Kathreen Devoid   On: 12/19/2014 16:17   Ct Abdomen Pelvis W Contrast  12/19/2014   CLINICAL DATA:  Follow-up diverticular abscess.  Hypotension.  EXAM: CT ABDOMEN AND PELVIS WITH CONTRAST  TECHNIQUE: Multidetector CT imaging of the abdomen and pelvis was performed using the standard protocol following bolus administration of intravenous contrast.  CONTRAST:  176mL OMNIPAQUE IOHEXOL 300 MG/ML  SOLN  COMPARISON:  CT abdomen pelvis 12/13/2011  FINDINGS: Interval resolution of rim enhancing fluid collection posterior to the sigmoid colon compatible with resolution of abscess. Sigmoid diverticular change noted in this area with some thickening of the bowel which also has improved. Findings are consistent with resolving diverticulitis and abscess. No abscess or free fluid on today's study. Negative for bowel obstruction.  Small bilateral pleural effusions with bibasilar atelectasis have developed since the prior study  Focal fatty infiltration of the left lobe of the liver adjacent to the falciform ligament unchanged. Pericholecystic fluid has developed since the prior study however the gallbladder wall is not thickened and no gallstones identified. Correlate with right upper quadrant pain and ultrasound if clinically indicated. Spleen is normal. Pancreas is atrophic without  edema or mass  Negative for renal obstruction or mass. Multiple renal cysts. Largest cyst left lower pole measuring 7 cm  Atherosclerotic aorta without aneurysm. Prior hysterectomy. No adenopathy.  Moderate disc degeneration and spurring in the lower lumbar spine. Negative for fracture  IMPRESSION: Improvement in diverticulitis. Resolution of diverticular abscess in the sigmoid region.  Interval development of pericholecystic fluid. No gallbladder thickening or gallstones. Correlate with pain in this area and possible ultrasound if indicated  Interval development of bilateral pleural effusions and bibasilar atelectasis.   Electronically Signed   By: Franchot Gallo M.D.   On: 12/19/2014 11:28   Ct Abdomen Pelvis W Contrast  12/13/2014   CLINICAL DATA:  Abdominal pain. Evaluate for diverticulitis or perforated bowel.  EXAM: CT ABDOMEN AND PELVIS WITH CONTRAST  TECHNIQUE: Multidetector CT imaging of the abdomen and pelvis was performed using the standard protocol following bolus administration of intravenous contrast.  CONTRAST:  122mL OMNIPAQUE IOHEXOL 300 MG/ML  SOLN  COMPARISON:  05/23/2013  FINDINGS: Lung bases are clear.  No effusions.  Heart is normal size.  Liver, gallbladder, spleen, pancreas and adrenals are unremarkable. Bilateral renal cysts, the largest in the left lower pole measuring up to 7 cm, similar to prior study. No hydronephrosis.  Aorta is heavily calcified, non aneurysmal.  Prior hysterectomy. Urinary bladder is decompressed, grossly unremarkable.  Extensive sigmoid diverticulosis. There is a small fluid collection adjacent to the sigmoid colon measuring 2 cm. It is unclear if this represents a small cyst within the left ovary for possible small fluid collection adjacent to the sigmoid colon related to diverticulitis. This was not present in 2014.  Stomach and small bowel are unremarkable. No free fluid, free air or adenopathy.  No acute bony abnormality or focal bone lesion.  IMPRESSION:  Sigmoid diverticulosis. 2 cm fluid collection adjacent to the sigmoid colon in the left pelvis. While this could reflect a small fluid collection/abscess related to diverticulitis, this also may represent a small cystic structure within the left ovary, but is new since 2014. I favor this is a small diverticular abscess. Consider treatment and repeat short-term imaging.  Bilateral renal cysts.   Electronically Signed   By: Rolm Baptise M.D.   On: 12/13/2014 12:34   Dg Chest Port 1 View  12/19/2014   CLINICAL DATA:  Cough, shortness of breath.  EXAM: PORTABLE CHEST - 1 VIEW  COMPARISON:  12/18/2014  FINDINGS: Interstitial coarsening. Increased bibasilar opacities. There may be small layering pleural effusions. Aortic atherosclerosis. Enlarged cardiac silhouette. Central vascular congestion. No pneumothorax. Osteopenia.  IMPRESSION: Small pleural effusions and increased bibasilar opacities which may reflect atelectasis, aspiration, or pneumonia.  Background COPD.  Recommend radiograph follow-up in 3-4 weeks to document resolution.   Electronically Signed   By: Carlos Levering M.D.   On: 12/19/2014 07:40    Assessment/Plan    1. Diverticulitis with colonic abscess - management per primary team, repeat CT shows improvement 2. Hypotension - improved and off pressors - unclear etiology. Echo showed normal LV function. Subtle RV changes however CT PE is negative. IVC is dilated suggesting elevated RA pressures/CVP - bp improved with IVF  3. Elevated tropoinin - peak troponin of 1.9, started to trend down however slight uptrend once it got to 1.6. Occurred in the setting of severe hypotension, unclear if obstructive CAD or due to drop in coronary perfusion pressures from hypotension -  atypical chest pain. Sharp constant chest throughout chest, constant for several hours, worst with position and deep breaths but did radiate to her left shoulder.  She was transferred to [facility] for cath.   - EKG without acute  ischemic changes. Echo shows normal LV function. CT PE negative.  She has had more CP this am with SOB.   - she has been started on heparin gtt. Thought most likely PE on initial presentation however CT PE negative, will plan for cath today as abcess has resolved by CT scan and she has been afebrile with normal WBC.   - continue ASA, hep, high dose statin. Low bp prohibits beta blocker or ACE-I - plan cath this am.  Cardiac catheterization was discussed with the patient fully including risks on myocardial infarction, death, stroke, bleeding, arrhythmia, dye allergy, renal insufficiency or bleeding.  All patient questions and concerns were discussed and the patient understands and is willing to proceed.   4. Hypokalemia - replacement per primary team        Sueanne Margarita, MD  12/21/2014  8:42 AM

## 2014-12-21 NOTE — H&P (View-Only) (Signed)
SUBJECTIVE:  Complains of sharp CP and SOB this am  OBJECTIVE:   Vitals:   Filed Vitals:   12/21/14 0500 12/21/14 0600 12/21/14 0700 12/21/14 0800  BP: 103/57 97/60 127/65   Pulse: 78 76 81   Temp:    98 F (36.7 C)  TempSrc:    Oral  Resp: 14 19 14    Height:      Weight:      SpO2: 91% 97% 93%    I&O's:   Intake/Output Summary (Last 24 hours) at 12/21/14 0842 Last data filed at 12/21/14 0700  Gross per 24 hour  Intake 2050.81 ml  Output    675 ml  Net 1375.81 ml   TELEMETRY: Reviewed telemetry pt in NSR:     PHYSICAL EXAM General: Well developed, well nourished, in no acute distress Head: Eyes PERRLA, No xanthomas.   Normal cephalic and atramatic  Lungs:   Clear bilaterally to auscultation and percussion. Heart:   HRRR S1 S2 Pulses are 2+ & equal. Abdomen: Bowel sounds are positive, abdomen soft and non-tender without masses Extremities:   No clubbing, cyanosis or edema.  DP +1 Neuro: Alert and oriented X 3. Psych:  Good affect, responds appropriately   LABS: Basic Metabolic Panel:  Recent Labs  12/20/14 1835 12/20/14 2205 12/21/14 0440  NA 138  --  141  K 3.2*  --  3.0*  CL 104  --  110  CO2 28  --  27  GLUCOSE 135*  --  111*  BUN <5*  --  <5*  CREATININE 0.74  --  0.58  CALCIUM 7.5*  --  6.5*  MG 10.4* 2.6* 1.9  PHOS 1.6*  --  1.5*   Liver Function Tests:  Recent Labs  12/19/14 0510 12/21/14 0440  AST 24 19  ALT 7 7  ALKPHOS 62 43  BILITOT 0.6 2.3*  PROT 4.8* 3.5*  ALBUMIN 2.4* 1.7*   No results for input(s): LIPASE, AMYLASE in the last 72 hours. CBC:  Recent Labs  12/20/14 0450 12/20/14 1835  WBC 10.0 8.0  HGB 10.9* 10.6*  HCT 36.0 33.6*  MCV 98.6 96.8  PLT 222 188   Cardiac Enzymes:  Recent Labs  12/19/14 2327 12/20/14 1835 12/21/14 0440  TROPONINI 1.66* 0.78* 0.49*   BNP: Invalid input(s): POCBNP D-Dimer: No results for input(s): DDIMER in the last 72 hours. Hemoglobin A1C: No results for input(s): HGBA1C in  the last 72 hours. Fasting Lipid Panel: No results for input(s): CHOL, HDL, LDLCALC, TRIG, CHOLHDL, LDLDIRECT in the last 72 hours. Thyroid Function Tests: No results for input(s): TSH, T4TOTAL, T3FREE, THYROIDAB in the last 72 hours.  Invalid input(s): FREET3 Anemia Panel:  Recent Labs  12/20/14 1116  VITAMINB12 672  FOLATE 4.6  FERRITIN 141  TIBC 183*  IRON 21*  RETICCTPCT 1.5   Coag Panel:   Lab Results  Component Value Date   INR 1.43 12/19/2014    RADIOLOGY: Dg Chest 2 View  12/18/2014   CLINICAL DATA:  Left-sided rib pain and cough.  Shortness of breath.  EXAM: CHEST  2 VIEW  COMPARISON:  12/13/2014 abdominal CT, 04/28/2014 radiograph  FINDINGS: Small left pleural effusion and associated airspace opacity. Background interstitial coarsening and hyperinflation. Aortic atherosclerosis. Normal heart size. Osteopenia and multilevel degenerative changes.  IMPRESSION: Small left pleural effusion and associated airspace opacity; atelectasis versus pneumonia. Recommend a repeat radiograph in 3-4 weeks to document resolution.  Background COPD.   Electronically Signed   By: Mitzi Hansen  DelGaizo M.D.   On: 12/18/2014 06:53   Ct Angio Chest Pe W/cm &/or Wo Cm  12/19/2014   CLINICAL DATA:  Shortness of breath. Recent surgery for diverticulitis. Chest pain started last night.  EXAM: CT ANGIOGRAPHY CHEST WITH CONTRAST  TECHNIQUE: Multidetector CT imaging of the chest was performed using the standard protocol during bolus administration of intravenous contrast. Multiplanar CT image reconstructions and MIPs were obtained to evaluate the vascular anatomy.  CONTRAST:  66mL OMNIPAQUE IOHEXOL 350 MG/ML SOLN  COMPARISON:  None.  FINDINGS: There is adequate opacification of the pulmonary arteries. There is no pulmonary embolus. The main pulmonary artery, right main pulmonary artery and left main pulmonary arteries are normal in size. The heart size is mildly enlarged. There is no pericardial effusion. There  is coronary artery atherosclerosis in the LAD, circumflex and RCA.  There are bilateral small pleural effusions. There is bilateral compressive atelectasis, left greater than right. There is bilateral centrilobular emphysema. There is no pneumothorax.  There is no axillary, hilar, or mediastinal adenopathy.  There is no lytic or blastic osseous lesion.  The visualized portions of the upper abdomen are unremarkable.  Review of the MIP images confirms the above findings.  IMPRESSION: 1. No evidence pulmonary embolus. 2. Bilateral small pleural effusions and compressive atelectasis. 3. Bilateral centrilobular emphysema.   Electronically Signed   By: Kathreen Devoid   On: 12/19/2014 16:17   Ct Abdomen Pelvis W Contrast  12/19/2014   CLINICAL DATA:  Follow-up diverticular abscess.  Hypotension.  EXAM: CT ABDOMEN AND PELVIS WITH CONTRAST  TECHNIQUE: Multidetector CT imaging of the abdomen and pelvis was performed using the standard protocol following bolus administration of intravenous contrast.  CONTRAST:  170mL OMNIPAQUE IOHEXOL 300 MG/ML  SOLN  COMPARISON:  CT abdomen pelvis 12/13/2011  FINDINGS: Interval resolution of rim enhancing fluid collection posterior to the sigmoid colon compatible with resolution of abscess. Sigmoid diverticular change noted in this area with some thickening of the bowel which also has improved. Findings are consistent with resolving diverticulitis and abscess. No abscess or free fluid on today's study. Negative for bowel obstruction.  Small bilateral pleural effusions with bibasilar atelectasis have developed since the prior study  Focal fatty infiltration of the left lobe of the liver adjacent to the falciform ligament unchanged. Pericholecystic fluid has developed since the prior study however the gallbladder wall is not thickened and no gallstones identified. Correlate with right upper quadrant pain and ultrasound if clinically indicated. Spleen is normal. Pancreas is atrophic without  edema or mass  Negative for renal obstruction or mass. Multiple renal cysts. Largest cyst left lower pole measuring 7 cm  Atherosclerotic aorta without aneurysm. Prior hysterectomy. No adenopathy.  Moderate disc degeneration and spurring in the lower lumbar spine. Negative for fracture  IMPRESSION: Improvement in diverticulitis. Resolution of diverticular abscess in the sigmoid region.  Interval development of pericholecystic fluid. No gallbladder thickening or gallstones. Correlate with pain in this area and possible ultrasound if indicated  Interval development of bilateral pleural effusions and bibasilar atelectasis.   Electronically Signed   By: Franchot Gallo M.D.   On: 12/19/2014 11:28   Ct Abdomen Pelvis W Contrast  12/13/2014   CLINICAL DATA:  Abdominal pain. Evaluate for diverticulitis or perforated bowel.  EXAM: CT ABDOMEN AND PELVIS WITH CONTRAST  TECHNIQUE: Multidetector CT imaging of the abdomen and pelvis was performed using the standard protocol following bolus administration of intravenous contrast.  CONTRAST:  123mL OMNIPAQUE IOHEXOL 300 MG/ML  SOLN  COMPARISON:  05/23/2013  FINDINGS: Lung bases are clear.  No effusions.  Heart is normal size.  Liver, gallbladder, spleen, pancreas and adrenals are unremarkable. Bilateral renal cysts, the largest in the left lower pole measuring up to 7 cm, similar to prior study. No hydronephrosis.  Aorta is heavily calcified, non aneurysmal.  Prior hysterectomy. Urinary bladder is decompressed, grossly unremarkable.  Extensive sigmoid diverticulosis. There is a small fluid collection adjacent to the sigmoid colon measuring 2 cm. It is unclear if this represents a small cyst within the left ovary for possible small fluid collection adjacent to the sigmoid colon related to diverticulitis. This was not present in 2014.  Stomach and small bowel are unremarkable. No free fluid, free air or adenopathy.  No acute bony abnormality or focal bone lesion.  IMPRESSION:  Sigmoid diverticulosis. 2 cm fluid collection adjacent to the sigmoid colon in the left pelvis. While this could reflect a small fluid collection/abscess related to diverticulitis, this also may represent a small cystic structure within the left ovary, but is new since 2014. I favor this is a small diverticular abscess. Consider treatment and repeat short-term imaging.  Bilateral renal cysts.   Electronically Signed   By: Rolm Baptise M.D.   On: 12/13/2014 12:34   Dg Chest Port 1 View  12/19/2014   CLINICAL DATA:  Cough, shortness of breath.  EXAM: PORTABLE CHEST - 1 VIEW  COMPARISON:  12/18/2014  FINDINGS: Interstitial coarsening. Increased bibasilar opacities. There may be small layering pleural effusions. Aortic atherosclerosis. Enlarged cardiac silhouette. Central vascular congestion. No pneumothorax. Osteopenia.  IMPRESSION: Small pleural effusions and increased bibasilar opacities which may reflect atelectasis, aspiration, or pneumonia.  Background COPD.  Recommend radiograph follow-up in 3-4 weeks to document resolution.   Electronically Signed   By: Carlos Levering M.D.   On: 12/19/2014 07:40    Assessment/Plan    1. Diverticulitis with colonic abscess - management per primary team, repeat CT shows improvement 2. Hypotension - improved and off pressors - unclear etiology. Echo showed normal LV function. Subtle RV changes however CT PE is negative. IVC is dilated suggesting elevated RA pressures/CVP - bp improved with IVF  3. Elevated tropoinin - peak troponin of 1.9, started to trend down however slight uptrend once it got to 1.6. Occurred in the setting of severe hypotension, unclear if obstructive CAD or due to drop in coronary perfusion pressures from hypotension -  atypical chest pain. Sharp constant chest throughout chest, constant for several hours, worst with position and deep breaths but did radiate to her left shoulder.  She was transferred to [facility] for cath.   - EKG without acute  ischemic changes. Echo shows normal LV function. CT PE negative.  She has had more CP this am with SOB.   - she has been started on heparin gtt. Thought most likely PE on initial presentation however CT PE negative, will plan for cath today as abcess has resolved by CT scan and she has been afebrile with normal WBC.   - continue ASA, hep, high dose statin. Low bp prohibits beta blocker or ACE-I - plan cath this am.  Cardiac catheterization was discussed with the patient fully including risks on myocardial infarction, death, stroke, bleeding, arrhythmia, dye allergy, renal insufficiency or bleeding.  All patient questions and concerns were discussed and the patient understands and is willing to proceed.   4. Hypokalemia - replacement per primary team        Sueanne Margarita, MD  12/21/2014  8:42 AM

## 2014-12-21 NOTE — Progress Notes (Signed)
Pt placed on enteric precautions to rule out c-diff. Will send specimen when available without mix of urine.

## 2014-12-22 ENCOUNTER — Encounter (HOSPITAL_COMMUNITY): Payer: Self-pay | Admitting: Interventional Cardiology

## 2014-12-22 ENCOUNTER — Ambulatory Visit: Payer: Medicare Other | Admitting: Vascular Surgery

## 2014-12-22 DIAGNOSIS — R0789 Other chest pain: Secondary | ICD-10-CM

## 2014-12-22 DIAGNOSIS — A047 Enterocolitis due to Clostridium difficile: Secondary | ICD-10-CM

## 2014-12-22 DIAGNOSIS — G894 Chronic pain syndrome: Secondary | ICD-10-CM | POA: Diagnosis present

## 2014-12-22 DIAGNOSIS — Z720 Tobacco use: Secondary | ICD-10-CM

## 2014-12-22 DIAGNOSIS — J441 Chronic obstructive pulmonary disease with (acute) exacerbation: Secondary | ICD-10-CM

## 2014-12-22 DIAGNOSIS — A0472 Enterocolitis due to Clostridium difficile, not specified as recurrent: Secondary | ICD-10-CM | POA: Diagnosis present

## 2014-12-22 DIAGNOSIS — J449 Chronic obstructive pulmonary disease, unspecified: Secondary | ICD-10-CM | POA: Diagnosis present

## 2014-12-22 DIAGNOSIS — K5732 Diverticulitis of large intestine without perforation or abscess without bleeding: Secondary | ICD-10-CM | POA: Diagnosis present

## 2014-12-22 DIAGNOSIS — N179 Acute kidney failure, unspecified: Secondary | ICD-10-CM | POA: Diagnosis present

## 2014-12-22 DIAGNOSIS — E43 Unspecified severe protein-calorie malnutrition: Secondary | ICD-10-CM | POA: Diagnosis present

## 2014-12-22 DIAGNOSIS — I10 Essential (primary) hypertension: Secondary | ICD-10-CM | POA: Diagnosis present

## 2014-12-22 LAB — COMPREHENSIVE METABOLIC PANEL
ALT: 9 U/L (ref 0–35)
AST: 21 U/L (ref 0–37)
Albumin: 2.2 g/dL — ABNORMAL LOW (ref 3.5–5.2)
Alkaline Phosphatase: 56 U/L (ref 39–117)
Anion gap: 13 (ref 5–15)
BUN: 5 mg/dL — ABNORMAL LOW (ref 6–23)
CO2: 27 mmol/L (ref 19–32)
Calcium: 8.4 mg/dL (ref 8.4–10.5)
Chloride: 100 mmol/L (ref 96–112)
Creatinine, Ser: 1 mg/dL (ref 0.50–1.10)
GFR calc Af Amer: 64 mL/min — ABNORMAL LOW (ref >90)
GFR calc non Af Amer: 55 mL/min — ABNORMAL LOW (ref >90)
Glucose, Bld: 57 mg/dL — ABNORMAL LOW (ref 70–99)
Potassium: 4 mmol/L (ref 3.5–5.1)
Sodium: 140 mmol/L (ref 135–145)
Total Bilirubin: 1 mg/dL (ref 0.3–1.2)
Total Protein: 4.8 g/dL — ABNORMAL LOW (ref 6.0–8.3)

## 2014-12-22 LAB — CBC WITH DIFFERENTIAL/PLATELET
Basophils Absolute: 0 10*3/uL (ref 0.0–0.1)
Basophils Relative: 1 % (ref 0–1)
Eosinophils Absolute: 0.3 10*3/uL (ref 0.0–0.7)
Eosinophils Relative: 5 % (ref 0–5)
HCT: 33.5 % — ABNORMAL LOW (ref 36.0–46.0)
Hemoglobin: 10.4 g/dL — ABNORMAL LOW (ref 12.0–15.0)
Lymphocytes Relative: 19 % (ref 12–46)
Lymphs Abs: 1 10*3/uL (ref 0.7–4.0)
MCH: 30.2 pg (ref 26.0–34.0)
MCHC: 31 g/dL (ref 30.0–36.0)
MCV: 97.4 fL (ref 78.0–100.0)
Monocytes Absolute: 0.7 10*3/uL (ref 0.1–1.0)
Monocytes Relative: 13 % — ABNORMAL HIGH (ref 3–12)
Neutro Abs: 3.4 10*3/uL (ref 1.7–7.7)
Neutrophils Relative %: 62 % (ref 43–77)
Platelets: 264 10*3/uL (ref 150–400)
RBC: 3.44 MIL/uL — ABNORMAL LOW (ref 3.87–5.11)
RDW: 13.4 % (ref 11.5–15.5)
WBC: 5.5 10*3/uL (ref 4.0–10.5)

## 2014-12-22 LAB — PROCALCITONIN: Procalcitonin: 0.11 ng/mL

## 2014-12-22 LAB — MAGNESIUM
Magnesium: 1.9 mg/dL (ref 1.5–2.5)
Magnesium: 1.9 mg/dL (ref 1.5–2.5)

## 2014-12-22 LAB — PHOSPHORUS
Phosphorus: 2.8 mg/dL (ref 2.3–4.6)
Phosphorus: 2.7 mg/dL (ref 2.3–4.6)

## 2014-12-22 MED ORDER — DEXTROSE-NACL 5-0.9 % IV SOLN
INTRAVENOUS | Status: DC
  Administered 2014-12-22: 22:00:00 via INTRAVENOUS

## 2014-12-22 MED ORDER — HYDROCODONE-ACETAMINOPHEN 5-325 MG PO TABS
1.0000 | ORAL_TABLET | ORAL | Status: DC | PRN
  Administered 2014-12-22 – 2014-12-28 (×15): 1 via ORAL
  Filled 2014-12-22 (×15): qty 1

## 2014-12-22 MED ORDER — LISINOPRIL 5 MG PO TABS
5.0000 mg | ORAL_TABLET | Freq: Every day | ORAL | Status: DC
  Administered 2014-12-22 – 2014-12-26 (×5): 5 mg via ORAL
  Filled 2014-12-22 (×7): qty 1

## 2014-12-22 MED ORDER — IPRATROPIUM-ALBUTEROL 0.5-2.5 (3) MG/3ML IN SOLN
3.0000 mL | Freq: Three times a day (TID) | RESPIRATORY_TRACT | Status: DC
  Administered 2014-12-23: 3 mL via RESPIRATORY_TRACT
  Filled 2014-12-22: qty 3

## 2014-12-22 NOTE — Progress Notes (Signed)
[facility] TEAM 1 - Stepdown/ICU TEAM Progress Note  Lindsay Mitchell BDZ:[id number] DOB: [birth year] DOA: 12/15/2014 PCP: Glo Herring., MD  Admit HPI / Brief Narrative: 73yo WF PMHx depression, anxiety, fibromyalgia, severe COPD, diverticulosis, DVT, tobacco abuse Initially admitted 4/15 to [facility] with diverticulitis and contained abd abscess. She was treated with bowel rest and IV abx and followed by surgery although no surgical intervention was needed. On 4/19 she developed SOB, chest pain and hypotension. CTA chest was neg for PE and her troponin was elevated at 1.9. She was seen in consultation by cardiology who requested tx to [facility] for possible cath and PCCM asked to admit r/t hypotension and pressor needs  HPI/Subjective: 4/22  A/O 4, acute on chronic abdominal pain per patient. Negative N/V, positive diarrhea postprandial watery. Negative CP, negative SOB  Assessment/Plan: Severe COPD without acute exacerbation  -Continue DuoNeb QID - Titrate O2 to maintain SPO2 89-93% -After patient's discharge in approximately 6 weeks will require spirometry pre-/post bronchodilator, DLCO 2 properly classify her COPD -Prior to discharge obtain ambulatory SPO2 to evaluate for home O2 needs - tobacco abuse  -Patient states she has stopped smoking secondary to acute illness, will discuss treatment strategies to help her maintain post discharge  Pulmonary nodules  - known since 2014  HTN -Start patient on lisinopril 5 mg daily -Obtain lipid panel; for now continue on Lipitor 80 mg daily   Chest pain/Elevated troponin  -CTA chest neg PE  -Cath 12/21/2014 - heavy calcifcation. No intervention.  -Aspirin 81 mg daily  Acute renal failure -Improving; D5W-0.9% saline at 75 ml/hr -Strict in and out  Diverticulitis with colonic abscess  -Improving by CT scan, leukocytosis resolved, negative fever. -DC Zosyn per surgery, will monitor for reoccurrence closely  C.  difficile colitis -Continue Flagyl  Severe protein calorie malnutrition  -Will address when patient's C. difficile colitis resolved  Anemia - mild  -Anemia panel pending   Chronic pain syndrome -Gabapeentin 900mg  tid - home dose .  -Dc opioids   Code Status: FULL Family Communication: no family present at time of exam Disposition Plan: Resolution C. difficile colitis    Consultants: Dr.Matthew Donne Hazel ([facility])    Procedure/Significant Events: CT abd/pelvis 4/19>>> improvement in diverticulitis, resolution of diverticular abscess. Interval development of pericholecystic fluid, no gallstones or gallbladder thickening  CTA chest 4/19>>> neg PE, small bilat effusions  2D echo 4/19>>> EF 60-65%, mod dilated RV, PA peak pressure 44mmHg,  4/21 left heart catheterization;-Moderate to heavy three-vessel coronary calcification -LVEF= 55%   Culture 4/29 C Diff Positive by  PCR    Antibiotics: cipro 12/15/14>>4/19 Flagyl 12/15/14>>4/19 Vancomycin 4/19>>> stopped 4/22 per surgery Zosyn 4/19>>> stopped 4/22 per surgery Flagyl 4/21>>    DVT prophylaxis: Heparin subcutaneous   Devices    LINES / TUBES:      Continuous Infusions: . dextrose 5 % and 0.9% NaCl      Objective: VITAL SIGNS: Temp: 98.1 F (36.7 C) (04/22 1600) Temp Source: Oral (04/22 1600) BP: 143/50 mmHg (04/22 1600) Pulse Rate: 88 (04/22 1600) SPO2; FIO2:   Intake/Output Summary (Last 24 hours) at 12/22/14 1913 Last data filed at 12/22/14 1800  Gross per 24 hour  Intake   1040 ml  Output    750 ml  Net    290 ml     Exam: General: A/O 4, acute on chronic abdominal pain, No acute respiratory distress Lungs: decreased breath sounds diffusely, mild expiratory wheezing, negative crackles Cardiovascular: Regular rate and rhythm without  murmur gallop or rub normal S1 and S2 Abdomen: tender palpation greatest in the LLQ, nondistended, soft, bowel sounds positive, no rebound, no ascites,  no appreciable mass Extremities: No significant cyanosis, clubbing, or edema bilateral lower extremities  Data Reviewed: Basic Metabolic Panel:  Recent Labs Lab 12/20/14 0450  12/20/14 1835 12/20/14 2205 12/21/14 0440 12/21/14 1120 12/22/14 0406 12/22/14 0938  NA 142  --  138  --  141 141  --  140  K 3.1*  --  3.2*  --  3.0* 4.7  --  4.0  CL 106  --  104  --  110 105  --  100  CO2 29  --  28  --  27 31  --  27  GLUCOSE 108*  --  135*  --  111* 78  --  57*  BUN 6  --  <5*  --  <5* <5*  --  5*  CREATININE 0.80  --  0.74  --  0.58 0.77  --  1.00  CALCIUM 8.3*  --  7.5*  --  6.5* 8.0*  --  8.4  MG  --   < > 10.4* 2.6* 1.9  --  1.9 1.9  PHOS  --   --  1.6*  --  1.5*  --  2.8 2.7  < > = values in this interval not displayed. Liver Function Tests:  Recent Labs Lab 12/19/14 0510 12/21/14 0440 12/22/14 0938  AST 24 19 21   ALT 7 7 9   ALKPHOS 62 43 56  BILITOT 0.6 2.3* 1.0  PROT 4.8* 3.5* 4.8*  ALBUMIN 2.4* 1.7* 2.2*   No results for input(s): LIPASE, AMYLASE in the last 168 hours. No results for input(s): AMMONIA in the last 168 hours. CBC:  Recent Labs Lab 12/19/14 0510 12/20/14 0450 12/20/14 1835 12/21/14 1120 12/22/14 0938  WBC 9.5 10.0 8.0 5.8 5.5  NEUTROABS  --   --   --   --  3.4  HGB 10.3* 10.9* 10.6* 10.0* 10.4*  HCT 34.1* 36.0 33.6* 32.5* 33.5*  MCV 100.0 98.6 96.8 97.9 97.4  PLT 188 222 188 231 264   Cardiac Enzymes:  Recent Labs Lab 12/19/14 1200 12/19/14 1752 12/19/14 2327 12/20/14 1835 12/21/14 0440  TROPONINI 1.62* 1.64* 1.66* 0.78* 0.49*   BNP (last 3 results) No results for input(s): BNP in the last 8760 hours.  ProBNP (last 3 results) No results for input(s): PROBNP in the last 8760 hours.  CBG: No results for input(s): GLUCAP in the last 168 hours.  Recent Results (from the past 240 hour(s))  MRSA PCR Screening     Status: None   Collection Time: 12/19/14  5:35 AM  Result Value Ref Range Status   MRSA by PCR NEGATIVE NEGATIVE  Final    Comment:        The GeneXpert MRSA Assay (FDA approved for NASAL specimens only), is one component of a comprehensive MRSA colonization surveillance program. It is not intended to diagnose MRSA infection nor to guide or monitor treatment for MRSA infections.   Culture, blood (x 2)     Status: None (Preliminary result)   Collection Time: 12/19/14  7:35 AM  Result Value Ref Range Status   Specimen Description BLOOD RIGHT WRIST  Final   Special Requests BOTTLES DRAWN AEROBIC AND ANAEROBIC 8CC  Final   Culture NO GROWTH 2 DAYS  Final   Report Status PENDING  Incomplete  Culture, blood (x 2)     Status: None (Preliminary  result)   Collection Time: 12/19/14  7:40 AM  Result Value Ref Range Status   Specimen Description BLOOD LEFT HAND  Final   Special Requests   Final    BOTTLES DRAWN AEROBIC AND ANAEROBIC AEB=8CC ANA=6CC   Culture NO GROWTH 2 DAYS  Final   Report Status PENDING  Incomplete  Clostridium Difficile by PCR     Status: Abnormal   Collection Time: 12/21/14 12:53 PM  Result Value Ref Range Status   C difficile by pcr POSITIVE (A) NEGATIVE Final    Comment: CRITICAL RESULT CALLED TO, READ BACK BY AND VERIFIED WITH: Garwin Brothers RN 16:00 12/21/14 ([email or web address])      Studies:  Recent x-ray studies have been reviewed in detail by the Attending Physician  Scheduled Meds:  Scheduled Meds: . aspirin  81 mg Oral Daily  . atorvastatin  80 mg Oral q1800  . gabapentin  900 mg Oral TID  . heparin  5,000 Units Subcutaneous 3 times per day  . ipratropium-albuterol  3 mL Nebulization Q6H  . Linaclotide  145 mcg Oral Daily  . lisinopril  5 mg Oral Daily  . metroNIDAZOLE  500 mg Oral 3 times per day  . nitroGLYCERIN  0.5 inch Topical 4 times per day  . sodium chloride  3 mL Intravenous Q12H    Time spent on care of this patient: 40 mins   Jaina Morin, Geraldo Docker , MD  [facility] Office  [phone] Pager [phone]  On-Call/Text Page:      [email or web address]       password [email or web address]  If 7PM-7AM, please contact night-coverage [email or web address] Password [facility] 12/22/2014, 7:13 PM   LOS: 7 days   Care during the described time interval was provided by me .  I have reviewed this patient's available data, including medical history, events of note, physical examination, radiology studies and test results as part of my evaluation  Dia Crawford, MD [phone] Pager

## 2014-12-22 NOTE — Progress Notes (Signed)
ANTIBIOTIC CONSULT NOTE - FOLLOW UP  Pharmacy Consult for Zosyn Indication: sepsis/diverticulitis  Allergies  Allergen Reactions  . Codeine Other (See Comments)    Makes patient not in right state of mind.   . Oxycodone Itching  . Talwin [Pentazocine] Other (See Comments)    Makes patient not in right state of mind.   . Valium [Diazepam] Other (See Comments)    Just doesn't work.     Patient Measurements: Height: 5\' 3"  (160 cm) Weight: 125 lb 14.1 oz (57.1 kg) IBW/kg (Calculated) : 52.4  Vital Signs: Temp: 98 F (36.7 C) (04/22 0730) Temp Source: Oral (04/22 0730) BP: 122/58 mmHg (04/22 0600) Pulse Rate: 75 (04/22 0600) Intake/Output from previous day: 04/21 0701 - 04/22 0700 In: 1065 [P.O.:690; I.V.:225; IV Piggyback:150] Out: 300 [Urine:300] Intake/Output from this shift:    Labs:  Recent Labs  12/20/14 0450 12/20/14 1835 12/21/14 0440 12/21/14 1120  WBC 10.0 8.0  --  5.8  HGB 10.9* 10.6*  --  10.0*  PLT 222 188  --  231  CREATININE 0.80 0.74 0.58 0.77   Estimated Creatinine Clearance: 53.4 mL/min (by C-G formula based on Cr of 0.77). No results for input(s): VANCOTROUGH, VANCOPEAK, VANCORANDOM, GENTTROUGH, GENTPEAK, GENTRANDOM, TOBRATROUGH, TOBRAPEAK, TOBRARND, AMIKACINPEAK, AMIKACINTROU, AMIKACIN in the last 72 hours.   Microbiology: Recent Results (from the past 720 hour(s))  MRSA PCR Screening     Status: None   Collection Time: 12/19/14  5:35 AM  Result Value Ref Range Status   MRSA by PCR NEGATIVE NEGATIVE Final    Comment:        The GeneXpert MRSA Assay (FDA approved for NASAL specimens only), is one component of a comprehensive MRSA colonization surveillance program. It is not intended to diagnose MRSA infection nor to guide or monitor treatment for MRSA infections.   Culture, blood (x 2)     Status: None (Preliminary result)   Collection Time: 12/19/14  7:35 AM  Result Value Ref Range Status   Specimen Description BLOOD RIGHT WRIST   Final   Special Requests BOTTLES DRAWN AEROBIC AND ANAEROBIC 8CC  Final   Culture NO GROWTH 2 DAYS  Final   Report Status PENDING  Incomplete  Culture, blood (x 2)     Status: None (Preliminary result)   Collection Time: 12/19/14  7:40 AM  Result Value Ref Range Status   Specimen Description BLOOD LEFT HAND  Final   Special Requests   Final    BOTTLES DRAWN AEROBIC AND ANAEROBIC AEB=8CC ANA=6CC   Culture NO GROWTH 2 DAYS  Final   Report Status PENDING  Incomplete  Clostridium Difficile by PCR     Status: Abnormal   Collection Time: 12/21/14 12:53 PM  Result Value Ref Range Status   C difficile by pcr POSITIVE (A) NEGATIVE Final    Comment: CRITICAL RESULT CALLED TO, READ BACK BY AND VERIFIED WITH: TRolena Infante RN 16:00 12/21/14 ([email or web address])     Anti-infectives    Start     Dose/Rate Route Frequency Ordered Stop   12/21/14 1700  metroNIDAZOLE (FLAGYL) tablet 500 mg     500 mg Oral 3 times per day 12/21/14 1607     12/19/14 2100  vancomycin (VANCOCIN) 500 mg in sodium chloride 0.9 % 100 mL IVPB  Status:  Discontinued     500 mg 100 mL/hr over 60 Minutes Intravenous Every 12 hours 12/19/14 0934 12/21/14 1446   12/19/14 1600  piperacillin-tazobactam (ZOSYN) IVPB 3.375 g  Status:  Discontinued  3.375 g 12.5 mL/hr over 240 Minutes Intravenous Every 8 hours 12/19/14 0805 12/19/14 0923   12/19/14 1000  piperacillin-tazobactam (ZOSYN) IVPB 3.375 g     3.375 g 12.5 mL/hr over 240 Minutes Intravenous Every 8 hours 12/19/14 0923     12/19/14 0900  vancomycin (VANCOCIN) 1,250 mg in sodium chloride 0.9 % 250 mL IVPB     1,250 mg 166.7 mL/hr over 90 Minutes Intravenous  Once 12/19/14 0804 12/19/14 1029   12/19/14 0700  piperacillin-tazobactam (ZOSYN) IVPB 3.375 g  Status:  Discontinued     3.375 g 100 mL/hr over 30 Minutes Intravenous  Once 12/19/14 0659 12/19/14 0924   12/16/14 0400  ciprofloxacin (CIPRO) IVPB 400 mg  Status:  Discontinued     400 mg 200 mL/hr over 60 Minutes Intravenous Every  12 hours 12/15/14 1837 12/19/14 0659   12/16/14 0100  metroNIDAZOLE (FLAGYL) IVPB 500 mg  Status:  Discontinued     500 mg 100 mL/hr over 60 Minutes Intravenous Every 8 hours 12/15/14 1837 12/19/14 0659   12/15/14 1515  metroNIDAZOLE (FLAGYL) IVPB 500 mg     500 mg 100 mL/hr over 60 Minutes Intravenous  Once 12/15/14 1506 12/15/14 1733   12/15/14 1515  ciprofloxacin (CIPRO) IVPB 400 mg     400 mg 200 mL/hr over 60 Minutes Intravenous  Once 12/15/14 1506 12/15/14 1631      Assessment: 74yo female admitted to [facility] 4/15 for diverticulitis tx due to not being able to keep PO abx down as an outpatient. Transferred to [facility] 4/20 for possible ACS management. Start on IV Cipro/falgy x 4 days, broaden therapy to Vanc/zosyn for r/o sepsis. WBC 5.8, afebrile, LA 1.9>>0.9, PCT 0.27>0.11. CCM dc'd vanc 4/21, want to continue Zosyn. Plan to establish LOT soon per notes. Of note, patient now C. diff positive.    Goal of Therapy:  Eradication of infection  Plan:  Continue Zosyn 3.375g q8h extended infusion Monitor clinical status and renal function F/u LOT Will need 10 days of cdiff tx post abx  Thank you for allowing pharmacy to be part of this patient's care team  [place], Pharm.D Clinical Pharmacy Resident Pager: [phone] 12/22/2014 .7:51 AM

## 2014-12-22 NOTE — Progress Notes (Signed)
NUTRITION FOLLOW UP  Intervention:   Encourage PO Provide snacks BID  Nutrition Dx:   Inadequate oral intake related to poor appetite, as evidenced by 25 - 50% meal completion; slowly improving   Goal:   Pt to meet >/= 90% of their estimated nutrition needs; unmet  Monitor:   Oral intake and tolerance, labs,weight trends  Assessment:   74 y.o. female PMH diverticulosis, DVT, COPD, chronic constipation, fibromyalgia, history of a stroke. Patient seen for 2 weeks of left lower quadrant abdominal pain that has been gradually increasing. Her po intake has been limited due to the pain and N/V prior to admission. Nutrition focused exam: mild temporal, clavicle , moderate patellar wasting.  Pt feeling a little better at time of visit. Per pt, her appetite is improving slowly, nausea has subsided and she ate about 50% of her breakfast. Pt states that she is tired of everybody telling her to eat, and would be willing to try some snacks. RD to order. Pt refuses any supplements, states it "doesn't want to stay down". Encouraged PO and provided a handout on low fiber foods to help pt chose foods that may be easier to tolerate when having diverticulitis flareups.    Labs reviewed.  Height: Ht Readings from Last 1 Encounters:  12/19/14 5\' 3"  (1.6 m)    Weight Status:   Wt Readings from Last 1 Encounters:  12/20/14 125 lb 14.1 oz (57.1 kg)  Admit wt 105# 12/15/14  Re-estimated needs:  Kcal: 1600-1800 Protein: 70-80 gr Fluid: 1.6-1.8 liters daily  Skin: WDL  Diet Order: Diet Carb Modified Fluid consistency:: Thin; Room service appropriate?: Yes   Intake/Output Summary (Last 24 hours) at 12/22/14 0931 Last data filed at 12/22/14 0800  Gross per 24 hour  Intake   1075 ml  Output    550 ml  Net    525 ml    Last BM: 4/21   Labs:   Recent Labs Lab 12/20/14 1835 12/20/14 2205 12/21/14 0440 12/21/14 1120 12/22/14 0406  NA 138  --  141 141  --   K 3.2*  --  3.0* 4.7  --   CL  104  --  110 105  --   CO2 28  --  27 31  --   BUN <5*  --  <5* <5*  --   CREATININE 0.74  --  0.58 0.77  --   CALCIUM 7.5*  --  6.5* 8.0*  --   MG 10.4* 2.6* 1.9  --  1.9  PHOS 1.6*  --  1.5*  --  2.8  GLUCOSE 135*  --  111* 78  --     CBG (last 3)  No results for input(s): GLUCAP in the last 72 hours.  Scheduled Meds: . aspirin  81 mg Oral Daily  . atorvastatin  80 mg Oral q1800  . gabapentin  900 mg Oral TID  . heparin  5,000 Units Subcutaneous 3 times per day  . ipratropium-albuterol  3 mL Nebulization Q6H  . Linaclotide  145 mcg Oral Daily  . metroNIDAZOLE  500 mg Oral 3 times per day  . nitroGLYCERIN  0.5 inch Topical 4 times per day  . sodium chloride  3 mL Intravenous Q12H    Continuous Infusions:   Dollie Mayse A. [facility] Dietetic Intern Pager: [phone] 12/22/2014 9:34 AM

## 2014-12-22 NOTE — Progress Notes (Signed)
[facility] Progress Note  1 Day Post-Op  Subjective: Pt c/o diarrhea, pain mildly improved from yesterday.  C.diff was positive.  Tolerating soft diet so far, but hesitant that she'll be sick.    Objective: Vital signs in last 24 hours: Temp:  [97.8 F (36.6 C)-98.7 F (37.1 C)] 98 F (36.7 C) (04/22 0730) Pulse Rate:  [67-84] 84 (04/22 0800) Resp:  [10-25] 13 (04/22 0800) BP: (89-144)/(36-76) 116/58 mmHg (04/22 0800) SpO2:  [88 %-98 %] 94 % (04/22 0800) Last BM Date: 12/21/14  Intake/Output from previous day: 04/21 0701 - 04/22 0700 In: 1065 [P.O.:690; I.V.:225; IV Piggyback:150] Out: 300 [Urine:300] Intake/Output this shift: Total I/O In: 10 [I.V.:10] Out: 250 [Urine:250]  PE: Gen:  Alert, NAD, pleasant Abd: Soft, mild tenderness in LLQ, ND, +BS, no HSM   Lab Results:   Recent Labs  12/20/14 1835 12/21/14 1120  WBC 8.0 5.8  HGB 10.6* 10.0*  HCT 33.6* 32.5*  PLT 188 231   BMET  Recent Labs  12/21/14 0440 12/21/14 1120  NA 141 141  K 3.0* 4.7  CL 110 105  CO2 27 31  GLUCOSE 111* 78  BUN <5* <5*  CREATININE 0.58 0.77  CALCIUM 6.5* 8.0*   PT/INR  Recent Labs  12/21/14 1120  LABPROT 15.4*  INR 1.21   CMP     Component Value Date/Time   NA 141 12/21/2014 1120   NA 142 10/31/2014 1447   K 4.7 12/21/2014 1120   CL 105 12/21/2014 1120   CO2 31 12/21/2014 1120   GLUCOSE 78 12/21/2014 1120   GLUCOSE 78 10/31/2014 1447   BUN <5* 12/21/2014 1120   BUN 24 10/31/2014 1447   CREATININE 0.77 12/21/2014 1120   CREATININE 1.05 01/10/2013 1748   CALCIUM 8.0* 12/21/2014 1120   PROT 3.5* 12/21/2014 0440   PROT 6.4 10/31/2014 1447   ALBUMIN 1.7* 12/21/2014 0440   AST 19 12/21/2014 0440   ALT 7 12/21/2014 0440   ALKPHOS 43 12/21/2014 0440   BILITOT 2.3* 12/21/2014 0440   BILITOT <0.2 10/31/2014 1447   GFRNONAA 83* 12/21/2014 1120   GFRNONAA 54* 01/10/2013 1748   GFRAA >90 12/21/2014 1120   GFRAA 63 01/10/2013 1748   Lipase      Component Value Date/Time   LIPASE 31 12/15/2014 1514       Studies/Results: No results found.  Anti-infectives: Anti-infectives    Start     Dose/Rate Route Frequency Ordered Stop   12/21/14 1700  metroNIDAZOLE (FLAGYL) tablet 500 mg     500 mg Oral 3 times per day 12/21/14 1607     12/19/14 2100  vancomycin (VANCOCIN) 500 mg in sodium chloride 0.9 % 100 mL IVPB  Status:  Discontinued     500 mg 100 mL/hr over 60 Minutes Intravenous Every 12 hours 12/19/14 0934 12/21/14 1446   12/19/14 1600  piperacillin-tazobactam (ZOSYN) IVPB 3.375 g  Status:  Discontinued     3.375 g 12.5 mL/hr over 240 Minutes Intravenous Every 8 hours 12/19/14 0805 12/19/14 0923   12/19/14 1000  piperacillin-tazobactam (ZOSYN) IVPB 3.375 g     3.375 g 12.5 mL/hr over 240 Minutes Intravenous Every 8 hours 12/19/14 0923     12/19/14 0900  vancomycin (VANCOCIN) 1,250 mg in sodium chloride 0.9 % 250 mL IVPB     1,250 mg 166.7 mL/hr over 90 Minutes Intravenous  Once 12/19/14 0804 12/19/14 1029   12/19/14 0700  piperacillin-tazobactam (ZOSYN) IVPB 3.375 g  Status:  Discontinued  3.375 g 100 mL/hr over 30 Minutes Intravenous  Once 12/19/14 0659 12/19/14 0924   12/16/14 0400  ciprofloxacin (CIPRO) IVPB 400 mg  Status:  Discontinued     400 mg 200 mL/hr over 60 Minutes Intravenous Every 12 hours 12/15/14 1837 12/19/14 0659   12/16/14 0100  metroNIDAZOLE (FLAGYL) IVPB 500 mg  Status:  Discontinued     500 mg 100 mL/hr over 60 Minutes Intravenous Every 8 hours 12/15/14 1837 12/19/14 0659   12/15/14 1515  metroNIDAZOLE (FLAGYL) IVPB 500 mg     500 mg 100 mL/hr over 60 Minutes Intravenous  Once 12/15/14 1506 12/15/14 1733   12/15/14 1515  ciprofloxacin (CIPRO) IVPB 400 mg     400 mg 200 mL/hr over 60 Minutes Intravenous  Once 12/15/14 1506 12/15/14 1631       Assessment/Plan Acute diverticulitis with abscess (Transfer from [facility]) -Repeat CT scan shows improving diverticulitis and resolution of abscess, has  already been on 7 days of antibiotics for diverticulitis -Continue low residue/heart healthy diet as tolerated -No acute surgical intervention needed as she is improving -Yesterday resumed on IV zosyn since pain returend after having some diarrhea, will d/c this today since pain is likely from c.diff C.Diff colitis/diarrhea -D/c zosyn and start oral flagyl Elevated troponin's -Cath showed 3 vessel severe coronary artery calcifications without any significant obstructive disease, no further cardiac workup, normal LVF per echo and cath, cards signed off. COPD     LOS: 7 days    Coralie Keens 12/22/2014, 8:42 AM Pager: [phone]

## 2014-12-23 ENCOUNTER — Inpatient Hospital Stay (HOSPITAL_COMMUNITY): Payer: Medicare Other

## 2014-12-23 DIAGNOSIS — J9601 Acute respiratory failure with hypoxia: Secondary | ICD-10-CM

## 2014-12-23 LAB — CBC WITH DIFFERENTIAL/PLATELET
Basophils Absolute: 0 10*3/uL (ref 0.0–0.1)
Basophils Relative: 1 % (ref 0–1)
Eosinophils Absolute: 0.5 10*3/uL (ref 0.0–0.7)
Eosinophils Relative: 8 % — ABNORMAL HIGH (ref 0–5)
HCT: 32.8 % — ABNORMAL LOW (ref 36.0–46.0)
Hemoglobin: 10.4 g/dL — ABNORMAL LOW (ref 12.0–15.0)
Lymphocytes Relative: 27 % (ref 12–46)
Lymphs Abs: 1.6 10*3/uL (ref 0.7–4.0)
MCH: 30.6 pg (ref 26.0–34.0)
MCHC: 31.7 g/dL (ref 30.0–36.0)
MCV: 96.5 fL (ref 78.0–100.0)
Monocytes Absolute: 0.8 10*3/uL (ref 0.1–1.0)
Monocytes Relative: 14 % — ABNORMAL HIGH (ref 3–12)
Neutro Abs: 2.9 10*3/uL (ref 1.7–7.7)
Neutrophils Relative %: 50 % (ref 43–77)
Platelets: 252 10*3/uL (ref 150–400)
RBC: 3.4 MIL/uL — ABNORMAL LOW (ref 3.87–5.11)
RDW: 13.4 % (ref 11.5–15.5)
WBC: 5.8 10*3/uL (ref 4.0–10.5)

## 2014-12-23 LAB — LIPID PANEL
Cholesterol: 81 mg/dL (ref 0–200)
HDL: 26 mg/dL — ABNORMAL LOW (ref >39)
LDL Cholesterol: 45 mg/dL (ref 0–99)
Total CHOL/HDL Ratio: 3.1 RATIO
Triglycerides: 49 mg/dL (ref <150)
VLDL: 10 mg/dL (ref 0–40)

## 2014-12-23 LAB — COMPREHENSIVE METABOLIC PANEL
ALT: 8 U/L (ref 0–35)
AST: 18 U/L (ref 0–37)
Albumin: 2.1 g/dL — ABNORMAL LOW (ref 3.5–5.2)
Alkaline Phosphatase: 53 U/L (ref 39–117)
Anion gap: 7 (ref 5–15)
BUN: <5 mg/dL — ABNORMAL LOW (ref 6–23)
CO2: 32 mmol/L (ref 19–32)
Calcium: 8.1 mg/dL — ABNORMAL LOW (ref 8.4–10.5)
Chloride: 102 mmol/L (ref 96–112)
Creatinine, Ser: 0.75 mg/dL (ref 0.50–1.10)
GFR calc Af Amer: >90 mL/min (ref >90)
GFR calc non Af Amer: 83 mL/min — ABNORMAL LOW (ref >90)
Glucose, Bld: 111 mg/dL — ABNORMAL HIGH (ref 70–99)
Potassium: 3.3 mmol/L — ABNORMAL LOW (ref 3.5–5.1)
Sodium: 141 mmol/L (ref 135–145)
Total Bilirubin: 0.3 mg/dL (ref 0.3–1.2)
Total Protein: 4.5 g/dL — ABNORMAL LOW (ref 6.0–8.3)

## 2014-12-23 LAB — BLOOD GAS, ARTERIAL
Acid-Base Excess: 9.5 mmol/L — ABNORMAL HIGH (ref 0.0–2.0)
Bicarbonate: 34.3 mEq/L — ABNORMAL HIGH (ref 20.0–24.0)
Drawn by: 213381
O2 Content: 6 L/min
O2 Saturation: 93 %
Patient temperature: 98.4
TCO2: 35.9 mmol/L (ref 0–100)
pCO2 arterial: 53.3 mmHg — ABNORMAL HIGH (ref 35.0–45.0)
pH, Arterial: 7.424 (ref 7.350–7.450)
pO2, Arterial: 66.3 mmHg — ABNORMAL LOW (ref 80.0–100.0)

## 2014-12-23 LAB — PHOSPHORUS: Phosphorus: 3 mg/dL (ref 2.3–4.6)

## 2014-12-23 LAB — BRAIN NATRIURETIC PEPTIDE: B Natriuretic Peptide: 1234.6 pg/mL — ABNORMAL HIGH (ref 0.0–100.0)

## 2014-12-23 LAB — MAGNESIUM: Magnesium: 1.7 mg/dL (ref 1.5–2.5)

## 2014-12-23 MED ORDER — METHYLPREDNISOLONE SODIUM SUCC 125 MG IJ SOLR
60.0000 mg | Freq: Two times a day (BID) | INTRAMUSCULAR | Status: DC
  Filled 2014-12-23 (×3): qty 0.96

## 2014-12-23 MED ORDER — METHYLPREDNISOLONE SODIUM SUCC 125 MG IJ SOLR
125.0000 mg | Freq: Once | INTRAMUSCULAR | Status: AC
  Administered 2014-12-23: 125 mg via INTRAVENOUS
  Filled 2014-12-23: qty 2

## 2014-12-23 MED ORDER — FUROSEMIDE 40 MG PO TABS
40.0000 mg | ORAL_TABLET | Freq: Every day | ORAL | Status: DC
  Filled 2014-12-23: qty 1

## 2014-12-23 MED ORDER — POTASSIUM CHLORIDE 10 MEQ/100ML IV SOLN
10.0000 meq | INTRAVENOUS | Status: AC
  Administered 2014-12-23 (×3): 10 meq via INTRAVENOUS
  Filled 2014-12-23 (×3): qty 100

## 2014-12-23 MED ORDER — MAGNESIUM SULFATE 50 % IJ SOLN
3.0000 g | Freq: Once | INTRAVENOUS | Status: AC
  Administered 2014-12-23: 3 g via INTRAVENOUS
  Filled 2014-12-23 (×2): qty 6

## 2014-12-23 MED ORDER — IPRATROPIUM-ALBUTEROL 0.5-2.5 (3) MG/3ML IN SOLN
3.0000 mL | RESPIRATORY_TRACT | Status: DC
  Administered 2014-12-23 (×3): 3 mL via RESPIRATORY_TRACT
  Filled 2014-12-23 (×3): qty 3

## 2014-12-23 MED ORDER — BUDESONIDE-FORMOTEROL FUMARATE 80-4.5 MCG/ACT IN AERO
2.0000 | INHALATION_SPRAY | Freq: Two times a day (BID) | RESPIRATORY_TRACT | Status: DC
  Administered 2014-12-23 – 2014-12-27 (×10): 2 via RESPIRATORY_TRACT
  Filled 2014-12-23 (×2): qty 6.9

## 2014-12-23 MED ORDER — IPRATROPIUM-ALBUTEROL 0.5-2.5 (3) MG/3ML IN SOLN
3.0000 mL | Freq: Four times a day (QID) | RESPIRATORY_TRACT | Status: DC
  Administered 2014-12-24 – 2014-12-25 (×5): 3 mL via RESPIRATORY_TRACT
  Filled 2014-12-23 (×5): qty 3

## 2014-12-23 MED ORDER — FUROSEMIDE 10 MG/ML IJ SOLN
40.0000 mg | Freq: Once | INTRAMUSCULAR | Status: AC
  Administered 2014-12-23: 40 mg via INTRAVENOUS
  Filled 2014-12-23: qty 4

## 2014-12-23 MED ORDER — FUROSEMIDE 40 MG PO TABS: 40.0000 mg | ORAL_TABLET | Freq: Every day | ORAL | Status: DC

## 2014-12-23 MED ORDER — METHYLPREDNISOLONE SODIUM SUCC 125 MG IJ SOLR
60.0000 mg | Freq: Three times a day (TID) | INTRAMUSCULAR | Status: DC
  Filled 2014-12-23 (×3): qty 0.96

## 2014-12-23 NOTE — Progress Notes (Signed)
Went in room at [facility] this am pt was having difficultly breathing her 02 sat was 83% on 4 liters  Of 02, I raised the 02 to 6 liters gave a breathing treatment, called doctor Rai from the hall way she immediately came in the room ordered a chest x- ray,  ABG, breathing treatment, and 40 mg of lasix IV. She all so discontinued her solid food and started her on clear liquids as a precaution for aspiration. After the breathing treatment her 02 sat came up to 94%. Doctor Rai came back to the room twice in the next 20 min.  Seeing that she was doing better Doctor Rai put her regular diet back in. The third time Doctor Rai went back to the room 15 min later the husband as very up set and stated to the Doctor   " you made fun of my wife I heard you in the hall making fun of her not being able to eat". Doctor Rai stated in return I never made fun of you wife I was concerned about her choking on her food and changed her diet and now I've change it back because she is doing better". The pt.'s husband continued to state to the doctor " I know  You were making fun of my wife. I was with the doctor during the entire time and I never heard anything that could be interpreted as ridicule in any way. Doctor Rai was in the in the room four timse in less than 45 min and each time she was very concerned about the pt.

## 2014-12-23 NOTE — Progress Notes (Signed)
[facility] Progress Note  2 Days Post-Op  Subjective: Pt states she was very SOB this am and had to be given a breathing tx.  No N/V, tolerating soft diet well, but anorexic.  She said things smell bad and the food doesn't taste good.  Mobilizing some.  Not much pain in abdomen.  Having BM's, but unsure about flatus.    Objective: Vital signs in last 24 hours: Temp:  [98.1 F (36.7 C)-98.8 F (37.1 C)] 98.4 F (36.9 C) (04/23 0436) Pulse Rate:  [79-92] 84 (04/23 0728) Resp:  [13-22] 22 (04/23 0728) BP: (116-159)/(50-93) 159/93 mmHg (04/23 0728) SpO2:  [81 %-97 %] 97 % (04/23 0728) Weight:  [55.2 kg (121 lb 11.1 oz)] 55.2 kg (121 lb 11.1 oz) (04/23 0436) Last BM Date: 12/22/14  Intake/Output from previous day: 04/22 0701 - 04/23 0700 In: 390 [P.O.:380; I.V.:10] Out: 900 [Urine:900] Intake/Output this shift:    PE: Gen:  Alert, NAD, pleasant Abd: Soft, ND, NT, +BS, no HSM   Lab Results:   Recent Labs  12/22/14 0938 12/23/14 0515  WBC 5.5 5.8  HGB 10.4* 10.4*  HCT 33.5* 32.8*  PLT 264 252   BMET  Recent Labs  12/22/14 0938 12/23/14 0515  NA 140 141  K 4.0 3.3*  CL 100 102  CO2 27 32  GLUCOSE 57* 111*  BUN 5* <5*  CREATININE 1.00 0.75  CALCIUM 8.4 8.1*   PT/INR  Recent Labs  12/21/14 1120  LABPROT 15.4*  INR 1.21   CMP     Component Value Date/Time   NA 141 12/23/2014 0515   NA 142 10/31/2014 1447   K 3.3* 12/23/2014 0515   CL 102 12/23/2014 0515   CO2 32 12/23/2014 0515   GLUCOSE 111* 12/23/2014 0515   GLUCOSE 78 10/31/2014 1447   BUN <5* 12/23/2014 0515   BUN 24 10/31/2014 1447   CREATININE 0.75 12/23/2014 0515   CREATININE 1.05 01/10/2013 1748   CALCIUM 8.1* 12/23/2014 0515   PROT 4.5* 12/23/2014 0515   PROT 6.4 10/31/2014 1447   ALBUMIN 2.1* 12/23/2014 0515   AST 18 12/23/2014 0515   ALT 8 12/23/2014 0515   ALKPHOS 53 12/23/2014 0515   BILITOT 0.3 12/23/2014 0515   BILITOT <0.2 10/31/2014 1447   GFRNONAA 83*  12/23/2014 0515   GFRNONAA 54* 01/10/2013 1748   GFRAA >90 12/23/2014 0515   GFRAA 63 01/10/2013 1748   Lipase     Component Value Date/Time   LIPASE 31 12/15/2014 1514       Studies/Results: No results found.  Anti-infectives: Anti-infectives    Start     Dose/Rate Route Frequency Ordered Stop   12/21/14 1700  metroNIDAZOLE (FLAGYL) tablet 500 mg     500 mg Oral 3 times per day 12/21/14 1607     12/19/14 2100  vancomycin (VANCOCIN) 500 mg in sodium chloride 0.9 % 100 mL IVPB  Status:  Discontinued     500 mg 100 mL/hr over 60 Minutes Intravenous Every 12 hours 12/19/14 0934 12/21/14 1446   12/19/14 1600  piperacillin-tazobactam (ZOSYN) IVPB 3.375 g  Status:  Discontinued     3.375 g 12.5 mL/hr over 240 Minutes Intravenous Every 8 hours 12/19/14 0805 12/19/14 0923   12/19/14 1000  piperacillin-tazobactam (ZOSYN) IVPB 3.375 g  Status:  Discontinued     3.375 g 12.5 mL/hr over 240 Minutes Intravenous Every 8 hours 12/19/14 0923 12/22/14 0855   12/19/14 0900  vancomycin (VANCOCIN) 1,250 mg in sodium chloride  0.9 % 250 mL IVPB     1,250 mg 166.7 mL/hr over 90 Minutes Intravenous  Once 12/19/14 0804 12/19/14 1029   12/19/14 0700  piperacillin-tazobactam (ZOSYN) IVPB 3.375 g  Status:  Discontinued     3.375 g 100 mL/hr over 30 Minutes Intravenous  Once 12/19/14 0659 12/19/14 0924   12/16/14 0400  ciprofloxacin (CIPRO) IVPB 400 mg  Status:  Discontinued     400 mg 200 mL/hr over 60 Minutes Intravenous Every 12 hours 12/15/14 1837 12/19/14 0659   12/16/14 0100  metroNIDAZOLE (FLAGYL) IVPB 500 mg  Status:  Discontinued     500 mg 100 mL/hr over 60 Minutes Intravenous Every 8 hours 12/15/14 1837 12/19/14 0659   12/15/14 1515  metroNIDAZOLE (FLAGYL) IVPB 500 mg     500 mg 100 mL/hr over 60 Minutes Intravenous  Once 12/15/14 1506 12/15/14 1733   12/15/14 1515  ciprofloxacin (CIPRO) IVPB 400 mg     400 mg 200 mL/hr over 60 Minutes Intravenous  Once 12/15/14 1506 12/15/14 1631        Assessment/Plan Acute diverticulitis with abscess (Transfer from [facility]) -Repeat CT scan shows improving diverticulitis and resolution of abscess, already finished 7 days of antibiotics for diverticulitis -Continue low residue/heart healthy diet as tolerated -No acute surgical intervention needed -Yesterday resumed on IV zosyn since pain returend after having some diarrhea, will d/c this today since pain is likely from c.diff C.Diff colitis/diarrhea -D/c zosyn stopped 12/23/14 and cont oral flagyl Elevated troponin's -Cath showed 3 vessel severe coronary artery calcifications without any significant obstructive disease, no further cardiac workup, normal LVF per echo and cath, cards signed off. COPD Disp -home when pulmonary status is better and tolerating enough PO to go home, will sign off.  Call with questions/concerns    LOS: 8 days    Coralie Keens 12/23/2014, 7:31 AM Pager: [phone]

## 2014-12-23 NOTE — Progress Notes (Signed)
[facility]                                                                              Patient Demographics  Lindsay Mitchell, is a 74 y.o. female, DOB - 10-May-1941, UXL:[id number]  Admit date - 12/15/2014   Admitting Physician Cristal Ford, DO  Outpatient Primary MD for the patient is Glo Herring., MD  LOS - 8   Chief Complaint  Patient presents with  . Diverticulitis       Brief HPI   Patient is a 74 year old female, with severe COPD, smoker, diverticulosis, fibromyalgia, DVT initially admitted on 4/15 to [facility] with diverticulitis and contained abdominal abscess. She was treated with bowel rest and IV antibiotics, followed by surgery however no surgical intervention was needed. On 4/19 patient developed shortness of breath, chest pain and hypotension. CT angiogram of the chest was negative for pulmonary embolism, troponin was elevated at 1.9.  Repeat CT abdomen on 4/19 had shown improvement in diverticulitis with resolution of diverticular abscess in the sigmoid region. Patient was seen by general surgery and cardiology for consultation.  The patient was transferred to [facility] and was admitted by PCCM due to hypotension and pressors needs.  Patient underwent cardiac cath on 4/21 which showed moderate to heavy three-vessel coronary Patient but no significant obstructive coronary disease, eccentric 40% proximal RCA overall normal LV function with EF of 55%, no regional wall motion abnormalities. Patient was transferred to telemetry floor on 4/22, I assumed care on 4/23.   4/23 7:30AM : I came to the floor and was immediately asked by the nurse to evaluate the patient. Patient was lethargic, hypoxic, lips were getting cyanotic, was placed on 6 L of O2 via nasal cannula at 7 AM, patient was significantly short of breath with increased work of breathing. Upon examination she had diminished lung sounds throughout. Patient was receiving breathing  treatment at the time of my examination. Ordered stat chest x-ray, BNP, ABG. I's and O's reviewed, patient 4.47 L positive. Gave 40 mg IV Lasix 1. CT chest on 4/19 had shown bilateral pleural effusions. I changed the diet to full liquids to avoid aspiration. However the tech came out and told me that patient is eating already as breakfast tray was delivered to her room so she took the tray away from the patient. I went back, reexamined the patient and found her to be alert, sitting upright and eating. Husband was sitting at the bedside. I explained this morning's event to the patient's husband in detail. As the patient was alert and oriented x3 and sitting upright and eating, I told the tech to give the patient the tray and she can continue the solid diet. Later around 9AM, I came back and reevaluated the patient and she was feeling significantly improved with nebulizer treatment and Lasix. However patient's husband was extremely upset with me, stating that I was at the nursing station "making fun of her and taking the tray away". I explained to the husband that given her medical condition earlier this morning with acute hypoxic respiratory failure and her mental status, I was extremely concerned about aspiration and  we (myself, night nurse, day nurse) were all worried about her impending respiratory failure and questioning if we needed to move her back to the stepdown unit, so we were discussing about her condition (and nobody was making fun of her).   Assessment & Plan    Principal Problem:  Acute hypoxic respiratory failure with severe underlying COPD and, likely volume overload - Somewhat improved after nebulizer breathing treatments, IV Lasix, IV Solu-Medrol 125 mg 1. ABG consistent with hypoxia, PCO2 53.3 - BNP elevated at 1234.6, chest x-ray consistent with persistent bilateral effusions, no convincing pneumonia, no pulmonary edema. Discontinued IV fluids today - I's and O's reviewed, patient is  4.47 L positive, placed on daily oral Lasix for 2 days - Placed on scheduled DuoNeb's q 4 hours, scheduled Solu-Medrol 60mg  IV q12hours, taper tomorrow, added Symbicort - May benefit from outpatient sleep study or CPAP at night and outpatient pulmonology follow-up - Given IV potassium, magnesium replacement  Active Problems:  Acute diverticulitis with Colonic diverticular abscess - Repeat CT scan 4/19 had shown improving diverticulitis with resolution of abscess, patient has finished his 7 days of antibiotics for diverticulitis. - Continue solid diet as tolerated, discontinued IV fluids - Surgery following, no acute surgical intervention needed  C. difficile colitis/diarrhea - Per surgery, discontinue Zosyn and continue oral Flagyl for C. difficile colitis  Nicotine abuse - Counseled the patient on nicotine cessation  Hypertension - Continue lisinopril, added Lasix today  Mild acute renal supple insufficiency at the time of admission on 4/19, creatinine was 1.12- resolved - Patient had continued to receive IV fluids, likely caused volume overload, creatinine 0.75 today, I have discontinued IV fluids, patient is now eating solid diet - Started on Lasix today, monitor creatinine function closely  Severe protein calorie malnutrition - Nutrition consult placed  Cad/ chest pain with elevated troponin/nstemi - CT angiogram of the chest was negative for pulmonary embolism - Cardiac cath was done on 12/21/14 which showed moderate to heavy three-vessel coronary Patient but no significant obstructive coronary disease, eccentric 40% proximal RCA overall normal LV function with EF of 55%, no regional wall motion abnormalities.  Hypokalemia, hypomagnesemia - Replaced IV magnesium, oral potassium supplementation today  Pulmonary nodules  - known since 2014  Code Status: Full CODE STATUS   Family Communication: Discussed in detail with the patient, all imaging results, lab results explained to  the patientand husband.   Disposition Plan: Currently stable, will continue monitoring on telemetry floor  Time Spent in minutes   45 minutes  Procedures  CT abd/pelvis 4/19>>> improvement in diverticulitis, resolution of diverticular abscess. Interval development of pericholecystic fluid, no gallstones or gallbladder thickening  CTA chest 4/19>>> neg PE, small bilat effusions  2D echo 4/19>>> EF 60-65%, mod dilated RV, PA peak pressure 47mmHg,  4/21 left heart catheterization;-Moderate to heavy three-vessel coronary calcification -LVEF= 55%   Consults   Coronary critical care  Gen. surgery  Cardiology   DVT Prophylaxis  heparin   Medications  Scheduled Meds: . aspirin  81 mg Oral Daily  . atorvastatin  80 mg Oral q1800  . budesonide-formoterol  2 puff Inhalation BID  . furosemide  40 mg Oral Daily  . gabapentin  900 mg Oral TID  . heparin  5,000 Units Subcutaneous 3 times per day  . ipratropium-albuterol  3 mL Nebulization Q4H  . Linaclotide  145 mcg Oral Daily  . lisinopril  5 mg Oral Daily  . magnesium sulfate LVP 250-500 ml  3 g Intravenous Once  .  methylPREDNISolone (SOLU-MEDROL) injection  125 mg Intravenous Once  . methylPREDNISolone (SOLU-MEDROL) injection  60 mg Intravenous Q8H  . metroNIDAZOLE  500 mg Oral 3 times per day  . nitroGLYCERIN  0.5 inch Topical 4 times per day  . potassium chloride  10 mEq Intravenous Q1 Hr x 3  . sodium chloride  3 mL Intravenous Q12H   Continuous Infusions:  PRN Meds:.sodium chloride, albuterol, HYDROcodone-acetaminophen, sodium chloride   Antibiotics   Anti-infectives    Start     Dose/Rate Route Frequency Ordered Stop   12/21/14 1700  metroNIDAZOLE (FLAGYL) tablet 500 mg     500 mg Oral 3 times per day 12/21/14 1607     12/19/14 2100  vancomycin (VANCOCIN) 500 mg in sodium chloride 0.9 % 100 mL IVPB  Status:  Discontinued     500 mg 100 mL/hr over 60 Minutes Intravenous Every 12 hours 12/19/14 0934 12/21/14 1446    12/19/14 1600  piperacillin-tazobactam (ZOSYN) IVPB 3.375 g  Status:  Discontinued     3.375 g 12.5 mL/hr over 240 Minutes Intravenous Every 8 hours 12/19/14 0805 12/19/14 0923   12/19/14 1000  piperacillin-tazobactam (ZOSYN) IVPB 3.375 g  Status:  Discontinued     3.375 g 12.5 mL/hr over 240 Minutes Intravenous Every 8 hours 12/19/14 0923 12/22/14 0855   12/19/14 0900  vancomycin (VANCOCIN) 1,250 mg in sodium chloride 0.9 % 250 mL IVPB     1,250 mg 166.7 mL/hr over 90 Minutes Intravenous  Once 12/19/14 0804 12/19/14 1029   12/19/14 0700  piperacillin-tazobactam (ZOSYN) IVPB 3.375 g  Status:  Discontinued     3.375 g 100 mL/hr over 30 Minutes Intravenous  Once 12/19/14 0659 12/19/14 0924   12/16/14 0400  ciprofloxacin (CIPRO) IVPB 400 mg  Status:  Discontinued     400 mg 200 mL/hr over 60 Minutes Intravenous Every 12 hours 12/15/14 1837 12/19/14 0659   12/16/14 0100  metroNIDAZOLE (FLAGYL) IVPB 500 mg  Status:  Discontinued     500 mg 100 mL/hr over 60 Minutes Intravenous Every 8 hours 12/15/14 1837 12/19/14 0659   12/15/14 1515  metroNIDAZOLE (FLAGYL) IVPB 500 mg     500 mg 100 mL/hr over 60 Minutes Intravenous  Once 12/15/14 1506 12/15/14 1733   12/15/14 1515  ciprofloxacin (CIPRO) IVPB 400 mg     400 mg 200 mL/hr over 60 Minutes Intravenous  Once 12/15/14 1506 12/15/14 1631        Subjective:   Lindsay Mitchell was seen and examined today morning several times. At 7:30 AM, patient was lethargic, getting cyanotic, pale, hypoxic with diminished lung sounds throughout.  The patient continued to improve after nebs treatment, Lasix. Denies any chest pain, nausea, vomiting, dizziness, lightheadedness. Complaining of shortness of breath and abdominal discomfort.    Objective:   Blood pressure 159/93, pulse 84, temperature 98.4 F (36.9 C), temperature source Oral, resp. rate 22, height 5\' 3"  (1.6 m), weight 55.2 kg (121 lb 11.1 oz), SpO2 97 %.  Wt Readings from Last 3 Encounters:    12/23/14 55.2 kg (121 lb 11.1 oz)  11/23/14 51.529 kg (113 lb 9.6 oz)  11/07/14 50.349 kg (111 lb)     Intake/Output Summary (Last 24 hours) at 12/23/14 0954 Last data filed at 12/23/14 0857  Gross per 24 hour  Intake    440 ml  Output   1000 ml  Net   -560 ml    Exam  General: Initially was lethargic, pale and cyanotic with increased work of  breathing, subsequently improved and became alert and oriented after Lasix, Solu-Medrol and breathing treatment   HEENT:  PERRLA, EOMI, Anicteic Sclera, mucous membranes moist.   Neck: Supple, no JVD, no masses  CVS: S1 S2 auscultated, no rubs, murmurs or gallops. Regular rate and rhythm.  Respiratory:Diminished breath sounds throughout   Abdomen: Soft,  tenderness in the left lower quadrant area, nondistended, + bowel sounds  Ext: no cyanosis clubbing or edema  Neuro: Cr N's II- XII. Strength 5/5 upper and lower extremities bilaterally  Skin: No rashes  Psych: Initially lethargic however subsequently became alert and oriented, normal demeanor   Data Review   Micro Results Recent Results (from the past 240 hour(s))  MRSA PCR Screening     Status: None   Collection Time: 12/19/14  5:35 AM  Result Value Ref Range Status   MRSA by PCR NEGATIVE NEGATIVE Final    Comment:        The GeneXpert MRSA Assay (FDA approved for NASAL specimens only), is one component of a comprehensive MRSA colonization surveillance program. It is not intended to diagnose MRSA infection nor to guide or monitor treatment for MRSA infections.   Culture, blood (x 2)     Status: None (Preliminary result)   Collection Time: 12/19/14  7:35 AM  Result Value Ref Range Status   Specimen Description BLOOD RIGHT WRIST  Final   Special Requests BOTTLES DRAWN AEROBIC AND ANAEROBIC 8CC  Final   Culture NO GROWTH 4 DAYS  Final   Report Status PENDING  Incomplete  Culture, blood (x 2)     Status: None (Preliminary result)   Collection Time: 12/19/14  7:40  AM  Result Value Ref Range Status   Specimen Description BLOOD LEFT HAND  Final   Special Requests   Final    BOTTLES DRAWN AEROBIC AND ANAEROBIC AEB=8CC ANA=6CC   Culture NO GROWTH 4 DAYS  Final   Report Status PENDING  Incomplete  Clostridium Difficile by PCR     Status: Abnormal   Collection Time: 12/21/14 12:53 PM  Result Value Ref Range Status   C difficile by pcr POSITIVE (A) NEGATIVE Final    Comment: CRITICAL RESULT CALLED TO, READ BACK BY AND VERIFIED WITH: Garwin Brothers RN 16:00 12/21/14 ([email or web address])     Radiology Reports Dg Chest 2 View  12/18/2014   CLINICAL DATA:  Left-sided rib pain and cough.  Shortness of breath.  EXAM: CHEST  2 VIEW  COMPARISON:  12/13/2014 abdominal CT, 04/28/2014 radiograph  FINDINGS: Small left pleural effusion and associated airspace opacity. Background interstitial coarsening and hyperinflation. Aortic atherosclerosis. Normal heart size. Osteopenia and multilevel degenerative changes.  IMPRESSION: Small left pleural effusion and associated airspace opacity; atelectasis versus pneumonia. Recommend a repeat radiograph in 3-4 weeks to document resolution.  Background COPD.   Electronically Signed   By: Carlos Levering M.D.   On: 12/18/2014 06:53   Ct Angio Chest Pe W/cm &/or Wo Cm  12/19/2014   CLINICAL DATA:  Shortness of breath. Recent surgery for diverticulitis. Chest pain started last night.  EXAM: CT ANGIOGRAPHY CHEST WITH CONTRAST  TECHNIQUE: Multidetector CT imaging of the chest was performed using the standard protocol during bolus administration of intravenous contrast. Multiplanar CT image reconstructions and MIPs were obtained to evaluate the vascular anatomy.  CONTRAST:  20mL OMNIPAQUE IOHEXOL 350 MG/ML SOLN  COMPARISON:  None.  FINDINGS: There is adequate opacification of the pulmonary arteries. There is no pulmonary embolus. The main pulmonary artery, right main pulmonary artery  and left main pulmonary arteries are normal in size. The heart size is  mildly enlarged. There is no pericardial effusion. There is coronary artery atherosclerosis in the LAD, circumflex and RCA.  There are bilateral small pleural effusions. There is bilateral compressive atelectasis, left greater than right. There is bilateral centrilobular emphysema. There is no pneumothorax.  There is no axillary, hilar, or mediastinal adenopathy.  There is no lytic or blastic osseous lesion.  The visualized portions of the upper abdomen are unremarkable.  Review of the MIP images confirms the above findings.  IMPRESSION: 1. No evidence pulmonary embolus. 2. Bilateral small pleural effusions and compressive atelectasis. 3. Bilateral centrilobular emphysema.   Electronically Signed   By: Kathreen Devoid   On: 12/19/2014 16:17   Ct Abdomen Pelvis W Contrast  12/19/2014   CLINICAL DATA:  Follow-up diverticular abscess.  Hypotension.  EXAM: CT ABDOMEN AND PELVIS WITH CONTRAST  TECHNIQUE: Multidetector CT imaging of the abdomen and pelvis was performed using the standard protocol following bolus administration of intravenous contrast.  CONTRAST:  137mL OMNIPAQUE IOHEXOL 300 MG/ML  SOLN  COMPARISON:  CT abdomen pelvis 12/13/2011  FINDINGS: Interval resolution of rim enhancing fluid collection posterior to the sigmoid colon compatible with resolution of abscess. Sigmoid diverticular change noted in this area with some thickening of the bowel which also has improved. Findings are consistent with resolving diverticulitis and abscess. No abscess or free fluid on today's study. Negative for bowel obstruction.  Small bilateral pleural effusions with bibasilar atelectasis have developed since the prior study  Focal fatty infiltration of the left lobe of the liver adjacent to the falciform ligament unchanged. Pericholecystic fluid has developed since the prior study however the gallbladder wall is not thickened and no gallstones identified. Correlate with right upper quadrant pain and ultrasound if clinically  indicated. Spleen is normal. Pancreas is atrophic without edema or mass  Negative for renal obstruction or mass. Multiple renal cysts. Largest cyst left lower pole measuring 7 cm  Atherosclerotic aorta without aneurysm. Prior hysterectomy. No adenopathy.  Moderate disc degeneration and spurring in the lower lumbar spine. Negative for fracture  IMPRESSION: Improvement in diverticulitis. Resolution of diverticular abscess in the sigmoid region.  Interval development of pericholecystic fluid. No gallbladder thickening or gallstones. Correlate with pain in this area and possible ultrasound if indicated  Interval development of bilateral pleural effusions and bibasilar atelectasis.   Electronically Signed   By: Franchot Gallo M.D.   On: 12/19/2014 11:28   Ct Abdomen Pelvis W Contrast  12/13/2014   CLINICAL DATA:  Abdominal pain. Evaluate for diverticulitis or perforated bowel.  EXAM: CT ABDOMEN AND PELVIS WITH CONTRAST  TECHNIQUE: Multidetector CT imaging of the abdomen and pelvis was performed using the standard protocol following bolus administration of intravenous contrast.  CONTRAST:  140mL OMNIPAQUE IOHEXOL 300 MG/ML  SOLN  COMPARISON:  05/23/2013  FINDINGS: Lung bases are clear.  No effusions.  Heart is normal size.  Liver, gallbladder, spleen, pancreas and adrenals are unremarkable. Bilateral renal cysts, the largest in the left lower pole measuring up to 7 cm, similar to prior study. No hydronephrosis.  Aorta is heavily calcified, non aneurysmal.  Prior hysterectomy. Urinary bladder is decompressed, grossly unremarkable.  Extensive sigmoid diverticulosis. There is a small fluid collection adjacent to the sigmoid colon measuring 2 cm. It is unclear if this represents a small cyst within the left ovary for possible small fluid collection adjacent to the sigmoid colon related to diverticulitis. This was not present in  2014.  Stomach and small bowel are unremarkable. No free fluid, free air or adenopathy.  No  acute bony abnormality or focal bone lesion.  IMPRESSION: Sigmoid diverticulosis. 2 cm fluid collection adjacent to the sigmoid colon in the left pelvis. While this could reflect a small fluid collection/abscess related to diverticulitis, this also may represent a small cystic structure within the left ovary, but is new since 2014. I favor this is a small diverticular abscess. Consider treatment and repeat short-term imaging.  Bilateral renal cysts.   Electronically Signed   By: Rolm Baptise M.D.   On: 12/13/2014 12:34   Dg Chest Port 1 View  12/23/2014   CLINICAL DATA:  Hypoxia  EXAM: PORTABLE CHEST - 1 VIEW  COMPARISON:  12/19/2014  FINDINGS: Bilateral pleural effusions partly obscures a hemidiaphragms. Lungs are hyperexpanded. No lung consolidation or edema.  Cardiac silhouette is borderline enlarged. No mediastinal or hilar masses.  Right PICC tip projects in the lower superior vena cava.  IMPRESSION: 1. Persistent bilateral pleural effusions. 2. No convincing pneumonia. No pulmonary edema. Stable changes of emphysema. 3. Right PICC is well positioned, new from the prior chest radiograph.   Electronically Signed   By: Lajean Manes M.D.   On: 12/23/2014 08:52   Dg Chest Port 1 View  12/19/2014   CLINICAL DATA:  Cough, shortness of breath.  EXAM: PORTABLE CHEST - 1 VIEW  COMPARISON:  12/18/2014  FINDINGS: Interstitial coarsening. Increased bibasilar opacities. There may be small layering pleural effusions. Aortic atherosclerosis. Enlarged cardiac silhouette. Central vascular congestion. No pneumothorax. Osteopenia.  IMPRESSION: Small pleural effusions and increased bibasilar opacities which may reflect atelectasis, aspiration, or pneumonia.  Background COPD.  Recommend radiograph follow-up in 3-4 weeks to document resolution.   Electronically Signed   By: Carlos Levering M.D.   On: 12/19/2014 07:40    CBC  Recent Labs Lab 12/20/14 0450 12/20/14 1835 12/21/14 1120 12/22/14 0938 12/23/14 0515   WBC 10.0 8.0 5.8 5.5 5.8  HGB 10.9* 10.6* 10.0* 10.4* 10.4*  HCT 36.0 33.6* 32.5* 33.5* 32.8*  PLT 222 188 231 264 252  MCV 98.6 96.8 97.9 97.4 96.5  MCH 29.9 30.5 30.1 30.2 30.6  MCHC 30.3 31.5 30.8 31.0 31.7  RDW 13.1 13.3 13.5 13.4 13.4  LYMPHSABS  --   --   --  1.0 1.6  MONOABS  --   --   --  0.7 0.8  EOSABS  --   --   --  0.3 0.5  BASOSABS  --   --   --  0.0 0.0    Chemistries   Recent Labs Lab 12/19/14 0510  12/20/14 1835 12/20/14 2205 12/21/14 0440 12/21/14 1120 12/22/14 0406 12/22/14 0938 12/23/14 0515  NA 141  < > 138  --  141 141  --  140 141  K 3.4*  < > 3.2*  --  3.0* 4.7  --  4.0 3.3*  CL 106  < > 104  --  110 105  --  100 102  CO2 29  < > 28  --  27 31  --  27 32  GLUCOSE 116*  < > 135*  --  111* 78  --  57* 111*  BUN 5*  < > <5*  --  <5* <5*  --  5* <5*  CREATININE 1.12*  < > 0.74  --  0.58 0.77  --  1.00 0.75  CALCIUM 7.9*  < > 7.5*  --  6.5* 8.0*  --  8.4 8.1*  MG  --   < > 10.4* 2.6* 1.9  --  1.9 1.9 1.7  AST 24  --   --   --  19  --   --  21 18  ALT 7  --   --   --  7  --   --  9 8  ALKPHOS 62  --   --   --  43  --   --  56 53  BILITOT 0.6  --   --   --  2.3*  --   --  1.0 0.3  < > = values in this interval not displayed. ------------------------------------------------------------------------------------------------------------------ estimated creatinine clearance is 53.4 mL/min (by C-G formula based on Cr of 0.75). ------------------------------------------------------------------------------------------------------------------ No results for input(s): HGBA1C in the last 72 hours. ------------------------------------------------------------------------------------------------------------------  Recent Labs  12/23/14 0515  CHOL 81  HDL 26*  LDLCALC 45  TRIG 49  CHOLHDL 3.1   ------------------------------------------------------------------------------------------------------------------ No results for input(s): TSH, T4TOTAL, T3FREE,  THYROIDAB in the last 72 hours.  Invalid input(s): FREET3 ------------------------------------------------------------------------------------------------------------------  Recent Labs  12/20/14 1116  VITAMINB12 672  FOLATE 4.6  FERRITIN 141  TIBC 183*  IRON 21*  RETICCTPCT 1.5    Coagulation profile  Recent Labs Lab 12/19/14 0735 12/21/14 1120  INR 1.43 1.21    No results for input(s): DDIMER in the last 72 hours.  Cardiac Enzymes  Recent Labs Lab 12/19/14 2327 12/20/14 1835 12/21/14 0440  TROPONINI 1.66* 0.78* 0.49*   ------------------------------------------------------------------------------------------------------------------ Invalid input(s): POCBNP  No results for input(s): GLUCAP in the last 72 hours.   Havana Baldwin M.D. [facility] 12/23/2014, 9:54 AM  Pager: [phone]   Between 7am to 7pm - call Pager - [phone]  After 7pm go to [email or web address] - password [email or web address]  Call night coverage person covering after 7pm

## 2014-12-24 DIAGNOSIS — N170 Acute kidney failure with tubular necrosis: Secondary | ICD-10-CM

## 2014-12-24 LAB — CULTURE, BLOOD (ROUTINE X 2)
Culture: NO GROWTH
Culture: NO GROWTH

## 2014-12-24 LAB — CBC WITH DIFFERENTIAL/PLATELET
Basophils Absolute: 0 10*3/uL (ref 0.0–0.1)
Basophils Relative: 0 % (ref 0–1)
Eosinophils Absolute: 0 10*3/uL (ref 0.0–0.7)
Eosinophils Relative: 0 % (ref 0–5)
HCT: 36.2 % (ref 36.0–46.0)
Hemoglobin: 11.4 g/dL — ABNORMAL LOW (ref 12.0–15.0)
Lymphocytes Relative: 7 % — ABNORMAL LOW (ref 12–46)
Lymphs Abs: 0.6 10*3/uL — ABNORMAL LOW (ref 0.7–4.0)
MCH: 29.9 pg (ref 26.0–34.0)
MCHC: 31.5 g/dL (ref 30.0–36.0)
MCV: 95 fL (ref 78.0–100.0)
Monocytes Absolute: 0.4 10*3/uL (ref 0.1–1.0)
Monocytes Relative: 5 % (ref 3–12)
Neutro Abs: 8.5 10*3/uL — ABNORMAL HIGH (ref 1.7–7.7)
Neutrophils Relative %: 88 % — ABNORMAL HIGH (ref 43–77)
Platelets: 300 10*3/uL (ref 150–400)
RBC: 3.81 MIL/uL — ABNORMAL LOW (ref 3.87–5.11)
RDW: 13.4 % (ref 11.5–15.5)
WBC: 9.6 10*3/uL (ref 4.0–10.5)

## 2014-12-24 LAB — COMPREHENSIVE METABOLIC PANEL
ALT: 9 U/L (ref 0–35)
AST: 20 U/L (ref 0–37)
Albumin: 2.3 g/dL — ABNORMAL LOW (ref 3.5–5.2)
Alkaline Phosphatase: 63 U/L (ref 39–117)
Anion gap: 7 (ref 5–15)
BUN: 8 mg/dL (ref 6–23)
CO2: 38 mmol/L — ABNORMAL HIGH (ref 19–32)
Calcium: 8.4 mg/dL (ref 8.4–10.5)
Chloride: 94 mmol/L — ABNORMAL LOW (ref 96–112)
Creatinine, Ser: 0.75 mg/dL (ref 0.50–1.10)
GFR calc Af Amer: >90 mL/min (ref >90)
GFR calc non Af Amer: 83 mL/min — ABNORMAL LOW (ref >90)
Glucose, Bld: 189 mg/dL — ABNORMAL HIGH (ref 70–99)
Potassium: 3.4 mmol/L — ABNORMAL LOW (ref 3.5–5.1)
Sodium: 139 mmol/L (ref 135–145)
Total Bilirubin: 0.3 mg/dL (ref 0.3–1.2)
Total Protein: 4.8 g/dL — ABNORMAL LOW (ref 6.0–8.3)

## 2014-12-24 LAB — MAGNESIUM: Magnesium: 2.4 mg/dL (ref 1.5–2.5)

## 2014-12-24 LAB — PHOSPHORUS: Phosphorus: 2.4 mg/dL (ref 2.3–4.6)

## 2014-12-24 MED ORDER — SODIUM CHLORIDE 0.9 % IJ SOLN
10.0000 mL | INTRAMUSCULAR | Status: DC | PRN
  Administered 2014-12-24 – 2014-12-26 (×4): 10 mL
  Administered 2014-12-27: 30 mL
  Administered 2014-12-27 – 2014-12-28 (×5): 10 mL
  Filled 2014-12-24 (×9): qty 40

## 2014-12-24 MED ORDER — METHYLPREDNISOLONE SODIUM SUCC 40 MG IJ SOLR
40.0000 mg | Freq: Every day | INTRAMUSCULAR | Status: DC
  Filled 2014-12-24: qty 1

## 2014-12-24 MED ORDER — SENNOSIDES-DOCUSATE SODIUM 8.6-50 MG PO TABS
1.0000 | ORAL_TABLET | Freq: Two times a day (BID) | ORAL | Status: DC
  Administered 2014-12-24 – 2014-12-25 (×3): 1 via ORAL
  Filled 2014-12-24 (×8): qty 1

## 2014-12-24 MED ORDER — FUROSEMIDE 10 MG/ML IJ SOLN
40.0000 mg | Freq: Two times a day (BID) | INTRAMUSCULAR | Status: DC
  Administered 2014-12-24 – 2014-12-28 (×8): 40 mg via INTRAVENOUS
  Filled 2014-12-24 (×11): qty 4

## 2014-12-24 MED ORDER — POLYETHYLENE GLYCOL 3350 17 G PO PACK
17.0000 g | PACK | Freq: Every day | ORAL | Status: DC | PRN
  Administered 2014-12-24: 17 g via ORAL
  Filled 2014-12-24 (×2): qty 1

## 2014-12-24 NOTE — Progress Notes (Signed)
Pt walked 150 ft. On 4liters of 02 saturation was 91% pt tolerated well

## 2014-12-24 NOTE — Progress Notes (Signed)
[facility]                                                                              Patient Demographics  Lindsay Mitchell, is a 74 y.o. female, DOB - 06/30/1941, HUT:[id number]  Admit date - 12/15/2014   Admitting Physician Cristal Ford, DO  Outpatient Primary MD for the patient is Glo Herring., MD  LOS - 9   Chief Complaint  Patient presents with  . Diverticulitis       Brief HPI   Patient is a 74 year old female, with severe COPD, smoker, diverticulosis, fibromyalgia, DVT initially admitted on 4/15 to [facility] with diverticulitis and contained abdominal abscess. She was treated with bowel rest and IV antibiotics, followed by surgery however no surgical intervention was needed. On 4/19 patient developed shortness of breath, chest pain and hypotension. CT angiogram of the chest was negative for pulmonary embolism, troponin was elevated at 1.9.  Repeat CT abdomen on 4/19 had shown improvement in diverticulitis with resolution of diverticular abscess in the sigmoid region. Patient was seen by general surgery and cardiology for consultation.  The patient was transferred to [facility] and was admitted by PCCM due to hypotension and pressors needs.  Patient underwent cardiac cath on 4/21 which showed moderate to heavy three-vessel coronary Patient but no significant obstructive coronary disease, eccentric 40% proximal RCA overall normal LV function with EF of 55%, no regional wall motion abnormalities. Patient was transferred to telemetry floor on 4/22, 4/23: resp distress, improved with lasix and nebs  Assessment & Plan    Principal Problem:  Acute hypoxic respiratory failure with severe underlying COPD and, likely volume overload - due to fluid overload and COPD ->2l positive -ECHO with normal EF -continue iV lasix, cut down solumedrol - Placed on scheduled DuoNeb's q 4 hours, Symbicort - Outpatient sleep study or CPAP at night and outpatient  pulmonology follow-up   Acute diverticulitis with Colonic diverticular abscess - Repeat CT scan 4/19 had shown improving diverticulitis with resolution of abscess,  - completed Zosyn course. - Continue solid diet as tolerated - Fu with CCS  C. difficile colitis/diarrhea - continue oral Flagyl  Nicotine abuse - Counseled the patient on nicotine cessation  Hypertension - Continue lisinopril  Mild acute renal supple insufficiency at the time of admission on 4/19, creatinine was 1.12- resolved - resolved  Severe protein calorie malnutrition - Nutrition consult placed  Cad/ chest pain with elevated troponin/nstemi - CT angiogram of the chest was negative for pulmonary embolism - Cardiac cath was done on 12/21/14 which showed moderate to heavy three-vessel coronary Patient but no significant obstructive coronary disease, eccentric 40% proximal RCA overall normal LV function with EF of 55%, no regional wall motion abnormalities.  Hypokalemia, hypomagnesemia - Replaced   Pulmonary nodules  - known since 2014, FU CT   Code Status: Full CODE STATUS   Family Communication: spouse at bedside Disposition Plan: Currently stable, will continue monitoring on telemetry floor  Time Spent in minutes   35 minutes  Procedures  CT abd/pelvis 4/19>>> improvement in diverticulitis, resolution of diverticular abscess. Interval development of pericholecystic fluid, no gallstones or gallbladder  thickening  CTA chest 4/19>>> neg PE, small bilat effusions  2D echo 4/19>>> EF 60-65%, mod dilated RV, PA peak pressure 50mmHg,  4/21 left heart catheterization;-Moderate to heavy three-vessel coronary calcification -LVEF= 55%   Consults   Coronary critical care  Gen. surgery  Cardiology   DVT Prophylaxis  heparin   Medications  Scheduled Meds: . aspirin  81 mg Oral Daily  . atorvastatin  80 mg Oral q1800  . budesonide-formoterol  2 puff Inhalation BID  . furosemide  40 mg Intravenous Q12H   . gabapentin  900 mg Oral TID  . heparin  5,000 Units Subcutaneous 3 times per day  . ipratropium-albuterol  3 mL Nebulization QID  . Linaclotide  145 mcg Oral Daily  . lisinopril  5 mg Oral Daily  . [START ON 12/25/2014] methylPREDNISolone (SOLU-MEDROL) injection  40 mg Intravenous Daily  . metroNIDAZOLE  500 mg Oral 3 times per day  . senna-docusate  1 tablet Oral BID  . sodium chloride  3 mL Intravenous Q12H   Continuous Infusions:  PRN Meds:.sodium chloride, albuterol, HYDROcodone-acetaminophen, polyethylene glycol, sodium chloride   Antibiotics   Anti-infectives    Start     Dose/Rate Route Frequency Ordered Stop   12/21/14 1700  metroNIDAZOLE (FLAGYL) tablet 500 mg     500 mg Oral 3 times per day 12/21/14 1607     12/19/14 2100  vancomycin (VANCOCIN) 500 mg in sodium chloride 0.9 % 100 mL IVPB  Status:  Discontinued     500 mg 100 mL/hr over 60 Minutes Intravenous Every 12 hours 12/19/14 0934 12/21/14 1446   12/19/14 1600  piperacillin-tazobactam (ZOSYN) IVPB 3.375 g  Status:  Discontinued     3.375 g 12.5 mL/hr over 240 Minutes Intravenous Every 8 hours 12/19/14 0805 12/19/14 0923   12/19/14 1000  piperacillin-tazobactam (ZOSYN) IVPB 3.375 g  Status:  Discontinued     3.375 g 12.5 mL/hr over 240 Minutes Intravenous Every 8 hours 12/19/14 0923 12/22/14 0855   12/19/14 0900  vancomycin (VANCOCIN) 1,250 mg in sodium chloride 0.9 % 250 mL IVPB     1,250 mg 166.7 mL/hr over 90 Minutes Intravenous  Once 12/19/14 0804 12/19/14 1029   12/19/14 0700  piperacillin-tazobactam (ZOSYN) IVPB 3.375 g  Status:  Discontinued     3.375 g 100 mL/hr over 30 Minutes Intravenous  Once 12/19/14 0659 12/19/14 0924   12/16/14 0400  ciprofloxacin (CIPRO) IVPB 400 mg  Status:  Discontinued     400 mg 200 mL/hr over 60 Minutes Intravenous Every 12 hours 12/15/14 1837 12/19/14 0659   12/16/14 0100  metroNIDAZOLE (FLAGYL) IVPB 500 mg  Status:  Discontinued     500 mg 100 mL/hr over 60 Minutes  Intravenous Every 8 hours 12/15/14 1837 12/19/14 0659   12/15/14 1515  metroNIDAZOLE (FLAGYL) IVPB 500 mg     500 mg 100 mL/hr over 60 Minutes Intravenous  Once 12/15/14 1506 12/15/14 1733   12/15/14 1515  ciprofloxacin (CIPRO) IVPB 400 mg     400 mg 200 mL/hr over 60 Minutes Intravenous  Once 12/15/14 1506 12/15/14 1631        Subjective:   Jovita Gamma breathing better, no BM in 2-3days    Objective:   Blood pressure 114/54, pulse 86, temperature 97.9 F (36.6 C), temperature source Oral, resp. rate 16, height 5\' 3"  (1.6 m), weight 52.844 kg (116 lb 8 oz), SpO2 94 %.  Wt Readings from Last 3 Encounters:  12/24/14 52.844 kg (116 lb 8 oz)  11/23/14 51.529 kg (113 lb 9.6 oz)  11/07/14 50.349 kg (111 lb)     Intake/Output Summary (Last 24 hours) at 12/24/14 1236 Last data filed at 12/24/14 1054  Gross per 24 hour  Intake    240 ml  Output   2600 ml  Net  -2360 ml    Exam  General: AAOx3, no distress  HEENT:  PERRLA, EOMI, Anicteic Sclera, mucous membranes moist.   Neck: Supple, no JVD, no masses  CVS: S1 S2 auscultated, no rubs, murmurs or gallops. Regular rate and rhythm.  Respiratory:CTAB  Abdomen: Soft,  NT, ND, nondistended, + bowel sounds  Ext: no cyanosis clubbing or edema  Neuro: Cr N's II- XII. Strength 5/5 upper and lower extremities bilaterally  Skin: No rashes   Data Review   Micro Results Recent Results (from the past 240 hour(s))  MRSA PCR Screening     Status: None   Collection Time: 12/19/14  5:35 AM  Result Value Ref Range Status   MRSA by PCR NEGATIVE NEGATIVE Final    Comment:        The GeneXpert MRSA Assay (FDA approved for NASAL specimens only), is one component of a comprehensive MRSA colonization surveillance program. It is not intended to diagnose MRSA infection nor to guide or monitor treatment for MRSA infections.   Culture, blood (x 2)     Status: None (Preliminary result)   Collection Time: 12/19/14  7:35 AM    Result Value Ref Range Status   Specimen Description BLOOD RIGHT WRIST  Final   Special Requests BOTTLES DRAWN AEROBIC AND ANAEROBIC 8CC  Final   Culture NO GROWTH 4 DAYS  Final   Report Status PENDING  Incomplete  Culture, blood (x 2)     Status: None (Preliminary result)   Collection Time: 12/19/14  7:40 AM  Result Value Ref Range Status   Specimen Description BLOOD LEFT HAND  Final   Special Requests   Final    BOTTLES DRAWN AEROBIC AND ANAEROBIC AEB=8CC ANA=6CC   Culture NO GROWTH 4 DAYS  Final   Report Status PENDING  Incomplete  Clostridium Difficile by PCR     Status: Abnormal   Collection Time: 12/21/14 12:53 PM  Result Value Ref Range Status   C difficile by pcr POSITIVE (A) NEGATIVE Final    Comment: CRITICAL RESULT CALLED TO, READ BACK BY AND VERIFIED WITH: Garwin Brothers RN 16:00 12/21/14 ([email or web address])     Radiology Reports Dg Chest 2 View  12/18/2014   CLINICAL DATA:  Left-sided rib pain and cough.  Shortness of breath.  EXAM: CHEST  2 VIEW  COMPARISON:  12/13/2014 abdominal CT, 04/28/2014 radiograph  FINDINGS: Small left pleural effusion and associated airspace opacity. Background interstitial coarsening and hyperinflation. Aortic atherosclerosis. Normal heart size. Osteopenia and multilevel degenerative changes.  IMPRESSION: Small left pleural effusion and associated airspace opacity; atelectasis versus pneumonia. Recommend a repeat radiograph in 3-4 weeks to document resolution.  Background COPD.   Electronically Signed   By: Carlos Levering M.D.   On: 12/18/2014 06:53   Ct Angio Chest Pe W/cm &/or Wo Cm  12/19/2014   CLINICAL DATA:  Shortness of breath. Recent surgery for diverticulitis. Chest pain started last night.  EXAM: CT ANGIOGRAPHY CHEST WITH CONTRAST  TECHNIQUE: Multidetector CT imaging of the chest was performed using the standard protocol during bolus administration of intravenous contrast. Multiplanar CT image reconstructions and MIPs were obtained to evaluate the  vascular anatomy.  CONTRAST:  67mL OMNIPAQUE IOHEXOL 350  MG/ML SOLN  COMPARISON:  None.  FINDINGS: There is adequate opacification of the pulmonary arteries. There is no pulmonary embolus. The main pulmonary artery, right main pulmonary artery and left main pulmonary arteries are normal in size. The heart size is mildly enlarged. There is no pericardial effusion. There is coronary artery atherosclerosis in the LAD, circumflex and RCA.  There are bilateral small pleural effusions. There is bilateral compressive atelectasis, left greater than right. There is bilateral centrilobular emphysema. There is no pneumothorax.  There is no axillary, hilar, or mediastinal adenopathy.  There is no lytic or blastic osseous lesion.  The visualized portions of the upper abdomen are unremarkable.  Review of the MIP images confirms the above findings.  IMPRESSION: 1. No evidence pulmonary embolus. 2. Bilateral small pleural effusions and compressive atelectasis. 3. Bilateral centrilobular emphysema.   Electronically Signed   By: Kathreen Devoid   On: 12/19/2014 16:17   Ct Abdomen Pelvis W Contrast  12/19/2014   CLINICAL DATA:  Follow-up diverticular abscess.  Hypotension.  EXAM: CT ABDOMEN AND PELVIS WITH CONTRAST  TECHNIQUE: Multidetector CT imaging of the abdomen and pelvis was performed using the standard protocol following bolus administration of intravenous contrast.  CONTRAST:  132mL OMNIPAQUE IOHEXOL 300 MG/ML  SOLN  COMPARISON:  CT abdomen pelvis 12/13/2011  FINDINGS: Interval resolution of rim enhancing fluid collection posterior to the sigmoid colon compatible with resolution of abscess. Sigmoid diverticular change noted in this area with some thickening of the bowel which also has improved. Findings are consistent with resolving diverticulitis and abscess. No abscess or free fluid on today's study. Negative for bowel obstruction.  Small bilateral pleural effusions with bibasilar atelectasis have developed since the prior  study  Focal fatty infiltration of the left lobe of the liver adjacent to the falciform ligament unchanged. Pericholecystic fluid has developed since the prior study however the gallbladder wall is not thickened and no gallstones identified. Correlate with right upper quadrant pain and ultrasound if clinically indicated. Spleen is normal. Pancreas is atrophic without edema or mass  Negative for renal obstruction or mass. Multiple renal cysts. Largest cyst left lower pole measuring 7 cm  Atherosclerotic aorta without aneurysm. Prior hysterectomy. No adenopathy.  Moderate disc degeneration and spurring in the lower lumbar spine. Negative for fracture  IMPRESSION: Improvement in diverticulitis. Resolution of diverticular abscess in the sigmoid region.  Interval development of pericholecystic fluid. No gallbladder thickening or gallstones. Correlate with pain in this area and possible ultrasound if indicated  Interval development of bilateral pleural effusions and bibasilar atelectasis.   Electronically Signed   By: Franchot Gallo M.D.   On: 12/19/2014 11:28   Ct Abdomen Pelvis W Contrast  12/13/2014   CLINICAL DATA:  Abdominal pain. Evaluate for diverticulitis or perforated bowel.  EXAM: CT ABDOMEN AND PELVIS WITH CONTRAST  TECHNIQUE: Multidetector CT imaging of the abdomen and pelvis was performed using the standard protocol following bolus administration of intravenous contrast.  CONTRAST:  142mL OMNIPAQUE IOHEXOL 300 MG/ML  SOLN  COMPARISON:  05/23/2013  FINDINGS: Lung bases are clear.  No effusions.  Heart is normal size.  Liver, gallbladder, spleen, pancreas and adrenals are unremarkable. Bilateral renal cysts, the largest in the left lower pole measuring up to 7 cm, similar to prior study. No hydronephrosis.  Aorta is heavily calcified, non aneurysmal.  Prior hysterectomy. Urinary bladder is decompressed, grossly unremarkable.  Extensive sigmoid diverticulosis. There is a small fluid collection adjacent to the  sigmoid colon measuring 2 cm. It is  unclear if this represents a small cyst within the left ovary for possible small fluid collection adjacent to the sigmoid colon related to diverticulitis. This was not present in 2014.  Stomach and small bowel are unremarkable. No free fluid, free air or adenopathy.  No acute bony abnormality or focal bone lesion.  IMPRESSION: Sigmoid diverticulosis. 2 cm fluid collection adjacent to the sigmoid colon in the left pelvis. While this could reflect a small fluid collection/abscess related to diverticulitis, this also may represent a small cystic structure within the left ovary, but is new since 2014. I favor this is a small diverticular abscess. Consider treatment and repeat short-term imaging.  Bilateral renal cysts.   Electronically Signed   By: Rolm Baptise M.D.   On: 12/13/2014 12:34   Dg Chest Port 1 View  12/23/2014   CLINICAL DATA:  Hypoxia  EXAM: PORTABLE CHEST - 1 VIEW  COMPARISON:  12/19/2014  FINDINGS: Bilateral pleural effusions partly obscures a hemidiaphragms. Lungs are hyperexpanded. No lung consolidation or edema.  Cardiac silhouette is borderline enlarged. No mediastinal or hilar masses.  Right PICC tip projects in the lower superior vena cava.  IMPRESSION: 1. Persistent bilateral pleural effusions. 2. No convincing pneumonia. No pulmonary edema. Stable changes of emphysema. 3. Right PICC is well positioned, new from the prior chest radiograph.   Electronically Signed   By: Lajean Manes M.D.   On: 12/23/2014 08:52   Dg Chest Port 1 View  12/19/2014   CLINICAL DATA:  Cough, shortness of breath.  EXAM: PORTABLE CHEST - 1 VIEW  COMPARISON:  12/18/2014  FINDINGS: Interstitial coarsening. Increased bibasilar opacities. There may be small layering pleural effusions. Aortic atherosclerosis. Enlarged cardiac silhouette. Central vascular congestion. No pneumothorax. Osteopenia.  IMPRESSION: Small pleural effusions and increased bibasilar opacities which may reflect  atelectasis, aspiration, or pneumonia.  Background COPD.  Recommend radiograph follow-up in 3-4 weeks to document resolution.   Electronically Signed   By: Carlos Levering M.D.   On: 12/19/2014 07:40    CBC  Recent Labs Lab 12/20/14 1835 12/21/14 1120 12/22/14 0938 12/23/14 0515 12/24/14 0527  WBC 8.0 5.8 5.5 5.8 9.6  HGB 10.6* 10.0* 10.4* 10.4* 11.4*  HCT 33.6* 32.5* 33.5* 32.8* 36.2  PLT 188 231 264 252 300  MCV 96.8 97.9 97.4 96.5 95.0  MCH 30.5 30.1 30.2 30.6 29.9  MCHC 31.5 30.8 31.0 31.7 31.5  RDW 13.3 13.5 13.4 13.4 13.4  LYMPHSABS  --   --  1.0 1.6 0.6*  MONOABS  --   --  0.7 0.8 0.4  EOSABS  --   --  0.3 0.5 0.0  BASOSABS  --   --  0.0 0.0 0.0    Chemistries   Recent Labs Lab 12/19/14 0510  12/21/14 0440 12/21/14 1120 12/22/14 0406 12/22/14 0938 12/23/14 0515 12/24/14 0527  NA 141  < > 141 141  --  140 141 139  K 3.4*  < > 3.0* 4.7  --  4.0 3.3* 3.4*  CL 106  < > 110 105  --  100 102 94*  CO2 29  < > 27 31  --  27 32 38*  GLUCOSE 116*  < > 111* 78  --  57* 111* 189*  BUN 5*  < > <5* <5*  --  5* <5* 8  CREATININE 1.12*  < > 0.58 0.77  --  1.00 0.75 0.75  CALCIUM 7.9*  < > 6.5* 8.0*  --  8.4 8.1* 8.4  MG  --   < >  1.9  --  1.9 1.9 1.7 2.4  AST 24  --  19  --   --  21 18 20   ALT 7  --  7  --   --  9 8 9   ALKPHOS 62  --  43  --   --  56 53 63  BILITOT 0.6  --  2.3*  --   --  1.0 0.3 0.3  < > = values in this interval not displayed. ------------------------------------------------------------------------------------------------------------------ estimated creatinine clearance is 53.4 mL/min (by C-G formula based on Cr of 0.75). ------------------------------------------------------------------------------------------------------------------ No results for input(s): HGBA1C in the last 72 hours. ------------------------------------------------------------------------------------------------------------------  Recent Labs  12/23/14 0515  CHOL 81  HDL 26*   LDLCALC 45  TRIG 49  CHOLHDL 3.1   ------------------------------------------------------------------------------------------------------------------ No results for input(s): TSH, T4TOTAL, T3FREE, THYROIDAB in the last 72 hours.  Invalid input(s): FREET3 ------------------------------------------------------------------------------------------------------------------ No results for input(s): VITAMINB12, FOLATE, FERRITIN, TIBC, IRON, RETICCTPCT in the last 72 hours.  Coagulation profile  Recent Labs Lab 12/19/14 0735 12/21/14 1120  INR 1.43 1.21    No results for input(s): DDIMER in the last 72 hours.  Cardiac Enzymes  Recent Labs Lab 12/19/14 2327 12/20/14 1835 12/21/14 0440  TROPONINI 1.66* 0.78* 0.49*   ------------------------------------------------------------------------------------------------------------------ Invalid input(s): POCBNP  No results for input(s): GLUCAP in the last 72 hours.   Domenic Polite M.D. [facility] 12/24/2014, 12:36 PM  Pager: [phone]  Between 7am to 7pm - call Pager - [phone]  After 7pm go to [email or web address] - password [email or web address]  Call night coverage person covering after 7pm

## 2014-12-24 NOTE — Progress Notes (Signed)
Pt O2 sat this morning 85% on 4L [place].  Pt breathing normally and in no distress.  O2 increased to 5L, pt O2 sat now 93%.  Pt requests no breathing treatments at this time.  Pt instructed to notify RN if she begins to have difficulty breathing.  Will continue to monitor pt closely.  Claudette Stapler, RN

## 2014-12-25 LAB — COMPREHENSIVE METABOLIC PANEL
ALT: 14 U/L (ref 0–35)
AST: 53 U/L — ABNORMAL HIGH (ref 0–37)
Albumin: 2.8 g/dL — ABNORMAL LOW (ref 3.5–5.2)
Alkaline Phosphatase: 60 U/L (ref 39–117)
Anion gap: 10 (ref 5–15)
BUN: 15 mg/dL (ref 6–23)
CO2: 39 mmol/L — ABNORMAL HIGH (ref 19–32)
Calcium: 8.5 mg/dL (ref 8.4–10.5)
Chloride: 92 mmol/L — ABNORMAL LOW (ref 96–112)
Creatinine, Ser: 0.9 mg/dL (ref 0.50–1.10)
GFR calc Af Amer: 73 mL/min — ABNORMAL LOW (ref >90)
GFR calc non Af Amer: 63 mL/min — ABNORMAL LOW (ref >90)
Glucose, Bld: 81 mg/dL (ref 70–99)
Potassium: 3.5 mmol/L (ref 3.5–5.1)
Sodium: 141 mmol/L (ref 135–145)
Total Bilirubin: 0.5 mg/dL (ref 0.3–1.2)
Total Protein: 5.4 g/dL — ABNORMAL LOW (ref 6.0–8.3)

## 2014-12-25 LAB — CBC WITH DIFFERENTIAL/PLATELET
Basophils Absolute: 0 10*3/uL (ref 0.0–0.1)
Basophils Relative: 0 % (ref 0–1)
Eosinophils Absolute: 0.2 10*3/uL (ref 0.0–0.7)
Eosinophils Relative: 2 % (ref 0–5)
HCT: 40.3 % (ref 36.0–46.0)
Hemoglobin: 12.8 g/dL (ref 12.0–15.0)
Lymphocytes Relative: 22 % (ref 12–46)
Lymphs Abs: 2.9 10*3/uL (ref 0.7–4.0)
MCH: 30.4 pg (ref 26.0–34.0)
MCHC: 31.8 g/dL (ref 30.0–36.0)
MCV: 95.7 fL (ref 78.0–100.0)
Monocytes Absolute: 1.5 10*3/uL — ABNORMAL HIGH (ref 0.1–1.0)
Monocytes Relative: 11 % (ref 3–12)
Neutro Abs: 8.5 10*3/uL — ABNORMAL HIGH (ref 1.7–7.7)
Neutrophils Relative %: 65 % (ref 43–77)
Platelets: 383 10*3/uL (ref 150–400)
RBC: 4.21 MIL/uL (ref 3.87–5.11)
RDW: 14.2 % (ref 11.5–15.5)
WBC: 13.2 10*3/uL — ABNORMAL HIGH (ref 4.0–10.5)

## 2014-12-25 LAB — TROPONIN I: Troponin I: 0.13 ng/mL — ABNORMAL HIGH (ref <0.031)

## 2014-12-25 MED ORDER — NITROGLYCERIN 0.4 MG SL SUBL
SUBLINGUAL_TABLET | SUBLINGUAL | Status: AC
Start: 2014-12-25 — End: 2014-12-25
  Administered 2014-12-25: 0.4 mg
  Filled 2014-12-25: qty 1

## 2014-12-25 MED ORDER — PREDNISONE 50 MG PO TABS
50.0000 mg | ORAL_TABLET | Freq: Every day | ORAL | Status: DC
  Administered 2014-12-26: 50 mg via ORAL
  Filled 2014-12-25 (×2): qty 1

## 2014-12-25 MED ORDER — NITROGLYCERIN 0.4 MG SL SUBL: 0.4000 mg | SUBLINGUAL_TABLET | SUBLINGUAL | Status: DC | PRN

## 2014-12-25 MED ORDER — IPRATROPIUM-ALBUTEROL 0.5-2.5 (3) MG/3ML IN SOLN
3.0000 mL | Freq: Three times a day (TID) | RESPIRATORY_TRACT | Status: DC
  Administered 2014-12-25 – 2014-12-26 (×5): 3 mL via RESPIRATORY_TRACT
  Filled 2014-12-25 (×4): qty 3

## 2014-12-25 MED ORDER — NITROGLYCERIN 0.4 MG SL SUBL
SUBLINGUAL_TABLET | SUBLINGUAL | Status: AC
  Administered 2014-12-25: 0.4 mg via SUBLINGUAL
  Filled 2014-12-25: qty 1

## 2014-12-25 MED ORDER — POLYETHYLENE GLYCOL 3350 17 G PO PACK
17.0000 g | PACK | Freq: Two times a day (BID) | ORAL | Status: DC
  Filled 2014-12-25 (×4): qty 1

## 2014-12-25 MED ORDER — METHYLPREDNISOLONE SODIUM SUCC 40 MG IJ SOLR
40.0000 mg | Freq: Every day | INTRAMUSCULAR | Status: AC
  Administered 2014-12-25: 40 mg via INTRAVENOUS
  Filled 2014-12-25: qty 1

## 2014-12-25 NOTE — Evaluation (Signed)
Physical Therapy Evaluation Patient Details Name: Lindsay Mitchell MRN: [id number] DOB: Oct 04, 1940 Today's Date: 12/25/2014   History of Present Illness  Adm 12/15/14 with abd pain and N/V; +diverticulosis with colonic abscess; treated conservatively/non-surgical; developed hypotension and severe CP; Cardiology consult with MI and PE ruled out; atypical angina PMHx- COPD, CVA, back surgeries x 3, DVT,     Clinical Impression  Pt admitted with above diagnosis, complications, and prolonged hospitalization. Evaluation/activity limited by decr SaO2 with dizziness. Pt currently with functional limitations due to the deficits listed below (see PT Problem List).  Pt will benefit from skilled PT to increase their independence and safety with mobility to allow discharge to the venue listed below.  Pt became emotional/tearful when discussing discharge plans and only wants to go home (husband in agreement).     Follow Up Recommendations Home health PT;Supervision/Assistance - 24 hour    Equipment Recommendations   (TBA when tolerates incr activity)    Recommendations for Other Services OT consult     Precautions / Restrictions Precautions Precautions: Fall Precaution Comments: decr SaO2      Mobility  Bed Mobility Overal bed mobility: Needs Assistance Bed Mobility: Supine to Sit;Sit to Supine     Supine to sit: Supervision Sit to supine: Supervision   General bed mobility comments: supervision due to impulsivity; inct time/effort  Transfers Overall transfer level: Needs assistance Equipment used: None Transfers: Sit to/from Stand;Stand Pivot Transfers Sit to Stand: Min assist Stand pivot transfers: Min assist       General transfer comment: pt stood impulsively with posterior lean required assist to recover; pivotal steps to [facility]  Ambulation/Gait             General Gait Details: unable due to decr SaO2   Stairs            Wheelchair Mobility    Modified Rankin  (Stroke Patients Only)       Balance Overall balance assessment: Needs assistance Sitting-balance support: No upper extremity supported;Feet supported Sitting balance-Leahy Scale: Fair     Standing balance support: No upper extremity supported Standing balance-Leahy Scale: Poor Standing balance comment: posterior lean with staggering step; stood x 45 seconds with min assist (at pt's insistence despite low SaO2) incr dizziness                             Pertinent Vitals/Pain On 3L  O2 with SaO2 87-88% at rest;  incr to 4L with incr to 90% at rest and dropped to 86% with transfer (pt impulsively moving to [facility] for BM and unable to obtain BP)  Pain Assessment: Faces Faces Pain Scale: Hurts even more Pain Location: abd; denied chest pain Pain Intervention(s): Limited activity within patient's tolerance;Repositioned    Home Living Family/patient expects to be discharged to:: Private residence Living Arrangements: Spouse/significant other Available Help at Discharge: Family;Available 24 hours/day Type of Home: House Home Access: Stairs to enter Entrance Stairs-Rails: Right Entrance Stairs-Number of Steps: 2 Home Layout: One level Home Equipment: Shower seat;Hand held Tourist information centre manager - standard      Prior Function Level of Independence: Independent         Comments: no home O2 PTA     Hand Dominance        Extremity/Trunk Assessment   Upper Extremity Assessment: Generalized weakness           Lower Extremity Assessment: Generalized weakness      Cervical / Trunk  Assessment: Normal  Communication   Communication: No difficulties  Cognition Arousal/Alertness: Awake/alert Behavior During Therapy: Flat affect;Impulsive Overall Cognitive Status: Within Functional Limits for tasks assessed                      General Comments General comments (skin integrity, edema, etc.): husband present    Exercises        Assessment/Plan     PT Assessment Patient needs continued PT services  PT Diagnosis Generalized weakness;Difficulty walking   PT Problem List Decreased strength;Decreased activity tolerance;Decreased balance;Decreased mobility;Decreased knowledge of use of DME;Decreased safety awareness;Cardiopulmonary status limiting activity;Pain  PT Treatment Interventions DME instruction;Gait training;Stair training;Functional mobility training;Therapeutic activities;Therapeutic exercise;Balance training;Patient/family education   PT Goals (Current goals can be found in the Care Plan section) Acute Rehab PT Goals Patient Stated Goal: be able to go home ASAP PT Goal Formulation: With patient Time For Goal Achievement: 01/08/15 Potential to Achieve Goals: Good    Frequency Min 3X/week   Barriers to discharge        Co-evaluation               End of Session Equipment Utilized During Treatment: Oxygen Activity Tolerance: Treatment limited secondary to medical complications (Comment) (decr resp status) Patient left: in bed;with call bell/phone within reach;with family/visitor present Nurse Communication: Mobility status;Other (comment) (decr SaO2; impulsivity)         Time: 4158-3094 PT Time Calculation (min) (ACUTE ONLY): 18 min   Charges:   PT Evaluation $Initial PT Evaluation Tier I: 1 Procedure     PT G Codes:        Lindsay Mitchell Jan 07, 2015, 12:05 PM Pager [phone]

## 2014-12-25 NOTE — Progress Notes (Signed)
[facility]                                                                              Patient Demographics  Lindsay Mitchell, is a 74 y.o. female, DOB - 03/30/1941, EXH:[id number]  Admit date - 12/15/2014   Admitting Physician Cristal Ford, DO  Outpatient Primary MD for the patient is Glo Herring., MD  LOS - 10   Chief Complaint  Patient presents with  . Diverticulitis       Brief HPI   Patient is a 74 year old female, with severe COPD, smoker, diverticulosis, fibromyalgia, DVT initially admitted on 4/15 to [facility] with diverticulitis and contained abdominal abscess. She was treated with bowel rest and IV antibiotics, followed by surgery however no surgical intervention was needed. On 4/19 patient developed shortness of breath, chest pain and hypotension. CT angiogram of the chest was negative for pulmonary embolism, troponin was elevated at 1.9.  Repeat CT abdomen on 4/19 had shown improvement in diverticulitis with resolution of diverticular abscess in the sigmoid region. Patient was seen by general surgery and cardiology for consultation.  The patient was transferred to [facility] and was admitted by PCCM due to hypotension and pressors needs.  Patient underwent cardiac cath on 4/21 which showed moderate to heavy three-vessel coronary Patient but no significant obstructive coronary disease, eccentric 40% proximal RCA overall normal LV function with EF of 55%, no regional wall motion abnormalities. Patient was transferred to telemetry floor on 4/22, 4/23: resp distress, improved with lasix and nebs  Assessment & Plan    Acute hypoxic respiratory failure with severe underlying COPD and, likely volume overload - due to fluid overload and COPD - 5L negative yesterday - ECHO with normal EF - continue IV lasix,  - cut down solumedrol to daily and prednisone taper tomorrow - continued scheduled DuoNeb's q 4 hours, Symbicort - Outpatient sleep  study or CPAP at night and outpatient pulmonology follow-up   Acute diverticulitis with Colonic diverticular abscess - Repeat CT scan 4/19 had shown improving diverticulitis with resolution of abscess,  - completed Zosyn course. - Continue solid diet as tolerated - Fu with CCS  C. difficile colitis/diarrhea - continue oral Flagyl  Constipation -increase miralax and senokot  Nicotine abuse - Counseled the patient on nicotine cessation  Hypertension - Continue lisinopril  Mild acute renal supple insufficiency at the time of admission on 4/19, creatinine was 1.12- resolved - resolved  Severe protein calorie malnutrition - Nutrition consult placed  Cad/ chest pain with elevated troponin/nstemi - CT angiogram of the chest was negative for pulmonary embolism - Cardiac cath was done on 12/21/14 which showed moderate to heavy three-vessel coronary Patient but no significant obstructive coronary disease, eccentric 40% proximal RCA overall normal LV function with EF of 55%, no regional wall motion abnormalities. - nitro PRN  Hypokalemia, hypomagnesemia - Replaced   Pulmonary nodules  - known since 2014, FU CT   Code Status: Full CODE STATUS  Family Communication: spouse at bedside Disposition Plan: Currently stable, will continue monitoring on telemetry floor  Time Spent in minutes   35 minutes  Procedures  CT abd/pelvis 4/19>>>  improvement in diverticulitis, resolution of diverticular abscess. Interval development of pericholecystic fluid, no gallstones or gallbladder thickening  CTA chest 4/19>>> neg PE, small bilat effusions  2D echo 4/19>>> EF 60-65%, mod dilated RV, PA peak pressure 71mmHg,  4/21 left heart catheterization;-Moderate to heavy three-vessel coronary calcification -LVEF= 55%   Consults   Coronary critical care  Gen. surgery  Cardiology   DVT Prophylaxis  heparin   Medications  Scheduled Meds: . aspirin  81 mg Oral Daily  . atorvastatin  80 mg  Oral q1800  . budesonide-formoterol  2 puff Inhalation BID  . furosemide  40 mg Intravenous Q12H  . gabapentin  900 mg Oral TID  . heparin  5,000 Units Subcutaneous 3 times per day  . ipratropium-albuterol  3 mL Nebulization TID  . lisinopril  5 mg Oral Daily  . metroNIDAZOLE  500 mg Oral 3 times per day  . nitroGLYCERIN      . polyethylene glycol  17 g Oral BID  . [START ON 12/26/2014] predniSONE  50 mg Oral Q breakfast  . senna-docusate  1 tablet Oral BID  . sodium chloride  3 mL Intravenous Q12H   Continuous Infusions:  PRN Meds:.sodium chloride, albuterol, HYDROcodone-acetaminophen, nitroGLYCERIN, sodium chloride, sodium chloride   Antibiotics   Anti-infectives    Start     Dose/Rate Route Frequency Ordered Stop   12/21/14 1700  metroNIDAZOLE (FLAGYL) tablet 500 mg     500 mg Oral 3 times per day 12/21/14 1607     12/19/14 2100  vancomycin (VANCOCIN) 500 mg in sodium chloride 0.9 % 100 mL IVPB  Status:  Discontinued     500 mg 100 mL/hr over 60 Minutes Intravenous Every 12 hours 12/19/14 0934 12/21/14 1446   12/19/14 1600  piperacillin-tazobactam (ZOSYN) IVPB 3.375 g  Status:  Discontinued     3.375 g 12.5 mL/hr over 240 Minutes Intravenous Every 8 hours 12/19/14 0805 12/19/14 0923   12/19/14 1000  piperacillin-tazobactam (ZOSYN) IVPB 3.375 g  Status:  Discontinued     3.375 g 12.5 mL/hr over 240 Minutes Intravenous Every 8 hours 12/19/14 0923 12/22/14 0855   12/19/14 0900  vancomycin (VANCOCIN) 1,250 mg in sodium chloride 0.9 % 250 mL IVPB     1,250 mg 166.7 mL/hr over 90 Minutes Intravenous  Once 12/19/14 0804 12/19/14 1029   12/19/14 0700  piperacillin-tazobactam (ZOSYN) IVPB 3.375 g  Status:  Discontinued     3.375 g 100 mL/hr over 30 Minutes Intravenous  Once 12/19/14 0659 12/19/14 0924   12/16/14 0400  ciprofloxacin (CIPRO) IVPB 400 mg  Status:  Discontinued     400 mg 200 mL/hr over 60 Minutes Intravenous Every 12 hours 12/15/14 1837 12/19/14 0659   12/16/14 0100   metroNIDAZOLE (FLAGYL) IVPB 500 mg  Status:  Discontinued     500 mg 100 mL/hr over 60 Minutes Intravenous Every 8 hours 12/15/14 1837 12/19/14 0659   12/15/14 1515  metroNIDAZOLE (FLAGYL) IVPB 500 mg     500 mg 100 mL/hr over 60 Minutes Intravenous  Once 12/15/14 1506 12/15/14 1733   12/15/14 1515  ciprofloxacin (CIPRO) IVPB 400 mg     400 mg 200 mL/hr over 60 Minutes Intravenous  Once 12/15/14 1506 12/15/14 1631        Subjective:   Lindsay Mitchell breathing better, no BM in 2-3days    Objective:   Blood pressure 97/57, pulse 78, temperature 98 F (36.7 C), temperature source Oral, resp. rate 16, height 5\' 3"  (1.6 m), weight  49.442 kg (109 lb), SpO2 90 %.  Wt Readings from Last 3 Encounters:  12/25/14 49.442 kg (109 lb)  11/23/14 51.529 kg (113 lb 9.6 oz)  11/07/14 50.349 kg (111 lb)     Intake/Output Summary (Last 24 hours) at 12/25/14 1245 Last data filed at 12/25/14 0927  Gross per 24 hour  Intake    510 ml  Output   3001 ml  Net  -2491 ml    Exam  General: AAOx3, no distress  HEENT:  PERRLA, EOMI, Anicteic Sclera, mucous membranes moist.   Neck: Supple, no JVD, no masses  CVS: S1 S2 auscultated, no rubs, murmurs or gallops. Regular rate and rhythm.  Respiratory:CTAB  Abdomen: Soft,  NT, ND, nondistended, + bowel sounds  Ext: no cyanosis clubbing or edema  Neuro: Cr N's II- XII. Strength 5/5 upper and lower extremities bilaterally  Skin: No rashes   Data Review   Micro Results Recent Results (from the past 240 hour(s))  MRSA PCR Screening     Status: None   Collection Time: 12/19/14  5:35 AM  Result Value Ref Range Status   MRSA by PCR NEGATIVE NEGATIVE Final    Comment:        The GeneXpert MRSA Assay (FDA approved for NASAL specimens only), is one component of a comprehensive MRSA colonization surveillance program. It is not intended to diagnose MRSA infection nor to guide or monitor treatment for MRSA infections.   Culture, blood (x  2)     Status: None   Collection Time: 12/19/14  7:35 AM  Result Value Ref Range Status   Specimen Description BLOOD RIGHT WRIST  Final   Special Requests BOTTLES DRAWN AEROBIC AND ANAEROBIC 8CC  Final   Culture NO GROWTH 5 DAYS  Final   Report Status 12/24/2014 FINAL  Final  Culture, blood (x 2)     Status: None   Collection Time: 12/19/14  7:40 AM  Result Value Ref Range Status   Specimen Description BLOOD LEFT HAND  Final   Special Requests   Final    BOTTLES DRAWN AEROBIC AND ANAEROBIC AEB=8CC ANA=6CC   Culture NO GROWTH 5 DAYS  Final   Report Status 12/24/2014 FINAL  Final  Clostridium Difficile by PCR     Status: Abnormal   Collection Time: 12/21/14 12:53 PM  Result Value Ref Range Status   C difficile by pcr POSITIVE (A) NEGATIVE Final    Comment: CRITICAL RESULT CALLED TO, READ BACK BY AND VERIFIED WITH: Garwin Brothers RN 16:00 12/21/14 ([email or web address])     Radiology Reports Dg Chest 2 View  12/18/2014   CLINICAL DATA:  Left-sided rib pain and cough.  Shortness of breath.  EXAM: CHEST  2 VIEW  COMPARISON:  12/13/2014 abdominal CT, 04/28/2014 radiograph  FINDINGS: Small left pleural effusion and associated airspace opacity. Background interstitial coarsening and hyperinflation. Aortic atherosclerosis. Normal heart size. Osteopenia and multilevel degenerative changes.  IMPRESSION: Small left pleural effusion and associated airspace opacity; atelectasis versus pneumonia. Recommend a repeat radiograph in 3-4 weeks to document resolution.  Background COPD.   Electronically Signed   By: Carlos Levering M.D.   On: 12/18/2014 06:53   Ct Angio Chest Pe W/cm &/or Wo Cm  12/19/2014   CLINICAL DATA:  Shortness of breath. Recent surgery for diverticulitis. Chest pain started last night.  EXAM: CT ANGIOGRAPHY CHEST WITH CONTRAST  TECHNIQUE: Multidetector CT imaging of the chest was performed using the standard protocol during bolus administration of intravenous contrast. Multiplanar CT image  reconstructions and MIPs were obtained to evaluate the vascular anatomy.  CONTRAST:  33mL OMNIPAQUE IOHEXOL 350 MG/ML SOLN  COMPARISON:  None.  FINDINGS: There is adequate opacification of the pulmonary arteries. There is no pulmonary embolus. The main pulmonary artery, right main pulmonary artery and left main pulmonary arteries are normal in size. The heart size is mildly enlarged. There is no pericardial effusion. There is coronary artery atherosclerosis in the LAD, circumflex and RCA.  There are bilateral small pleural effusions. There is bilateral compressive atelectasis, left greater than right. There is bilateral centrilobular emphysema. There is no pneumothorax.  There is no axillary, hilar, or mediastinal adenopathy.  There is no lytic or blastic osseous lesion.  The visualized portions of the upper abdomen are unremarkable.  Review of the MIP images confirms the above findings.  IMPRESSION: 1. No evidence pulmonary embolus. 2. Bilateral small pleural effusions and compressive atelectasis. 3. Bilateral centrilobular emphysema.   Electronically Signed   By: Kathreen Devoid   On: 12/19/2014 16:17   Ct Abdomen Pelvis W Contrast  12/19/2014   CLINICAL DATA:  Follow-up diverticular abscess.  Hypotension.  EXAM: CT ABDOMEN AND PELVIS WITH CONTRAST  TECHNIQUE: Multidetector CT imaging of the abdomen and pelvis was performed using the standard protocol following bolus administration of intravenous contrast.  CONTRAST:  145mL OMNIPAQUE IOHEXOL 300 MG/ML  SOLN  COMPARISON:  CT abdomen pelvis 12/13/2011  FINDINGS: Interval resolution of rim enhancing fluid collection posterior to the sigmoid colon compatible with resolution of abscess. Sigmoid diverticular change noted in this area with some thickening of the bowel which also has improved. Findings are consistent with resolving diverticulitis and abscess. No abscess or free fluid on today's study. Negative for bowel obstruction.  Small bilateral pleural effusions with  bibasilar atelectasis have developed since the prior study  Focal fatty infiltration of the left lobe of the liver adjacent to the falciform ligament unchanged. Pericholecystic fluid has developed since the prior study however the gallbladder wall is not thickened and no gallstones identified. Correlate with right upper quadrant pain and ultrasound if clinically indicated. Spleen is normal. Pancreas is atrophic without edema or mass  Negative for renal obstruction or mass. Multiple renal cysts. Largest cyst left lower pole measuring 7 cm  Atherosclerotic aorta without aneurysm. Prior hysterectomy. No adenopathy.  Moderate disc degeneration and spurring in the lower lumbar spine. Negative for fracture  IMPRESSION: Improvement in diverticulitis. Resolution of diverticular abscess in the sigmoid region.  Interval development of pericholecystic fluid. No gallbladder thickening or gallstones. Correlate with pain in this area and possible ultrasound if indicated  Interval development of bilateral pleural effusions and bibasilar atelectasis.   Electronically Signed   By: Franchot Gallo M.D.   On: 12/19/2014 11:28   Ct Abdomen Pelvis W Contrast  12/13/2014   CLINICAL DATA:  Abdominal pain. Evaluate for diverticulitis or perforated bowel.  EXAM: CT ABDOMEN AND PELVIS WITH CONTRAST  TECHNIQUE: Multidetector CT imaging of the abdomen and pelvis was performed using the standard protocol following bolus administration of intravenous contrast.  CONTRAST:  151mL OMNIPAQUE IOHEXOL 300 MG/ML  SOLN  COMPARISON:  05/23/2013  FINDINGS: Lung bases are clear.  No effusions.  Heart is normal size.  Liver, gallbladder, spleen, pancreas and adrenals are unremarkable. Bilateral renal cysts, the largest in the left lower pole measuring up to 7 cm, similar to prior study. No hydronephrosis.  Aorta is heavily calcified, non aneurysmal.  Prior hysterectomy. Urinary bladder is decompressed, grossly unremarkable.  Extensive sigmoid  diverticulosis. There is a small fluid collection adjacent to the sigmoid colon measuring 2 cm. It is unclear if this represents a small cyst within the left ovary for possible small fluid collection adjacent to the sigmoid colon related to diverticulitis. This was not present in 2014.  Stomach and small bowel are unremarkable. No free fluid, free air or adenopathy.  No acute bony abnormality or focal bone lesion.  IMPRESSION: Sigmoid diverticulosis. 2 cm fluid collection adjacent to the sigmoid colon in the left pelvis. While this could reflect a small fluid collection/abscess related to diverticulitis, this also may represent a small cystic structure within the left ovary, but is new since 2014. I favor this is a small diverticular abscess. Consider treatment and repeat short-term imaging.  Bilateral renal cysts.   Electronically Signed   By: Rolm Baptise M.D.   On: 12/13/2014 12:34   Dg Chest Port 1 View  12/23/2014   CLINICAL DATA:  Hypoxia  EXAM: PORTABLE CHEST - 1 VIEW  COMPARISON:  12/19/2014  FINDINGS: Bilateral pleural effusions partly obscures a hemidiaphragms. Lungs are hyperexpanded. No lung consolidation or edema.  Cardiac silhouette is borderline enlarged. No mediastinal or hilar masses.  Right PICC tip projects in the lower superior vena cava.  IMPRESSION: 1. Persistent bilateral pleural effusions. 2. No convincing pneumonia. No pulmonary edema. Stable changes of emphysema. 3. Right PICC is well positioned, new from the prior chest radiograph.   Electronically Signed   By: Lajean Manes M.D.   On: 12/23/2014 08:52   Dg Chest Port 1 View  12/19/2014   CLINICAL DATA:  Cough, shortness of breath.  EXAM: PORTABLE CHEST - 1 VIEW  COMPARISON:  12/18/2014  FINDINGS: Interstitial coarsening. Increased bibasilar opacities. There may be small layering pleural effusions. Aortic atherosclerosis. Enlarged cardiac silhouette. Central vascular congestion. No pneumothorax. Osteopenia.  IMPRESSION: Small pleural  effusions and increased bibasilar opacities which may reflect atelectasis, aspiration, or pneumonia.  Background COPD.  Recommend radiograph follow-up in 3-4 weeks to document resolution.   Electronically Signed   By: Carlos Levering M.D.   On: 12/19/2014 07:40    CBC  Recent Labs Lab 12/21/14 1120 12/22/14 0938 12/23/14 0515 12/24/14 0527 12/25/14 0544  WBC 5.8 5.5 5.8 9.6 13.2*  HGB 10.0* 10.4* 10.4* 11.4* 12.8  HCT 32.5* 33.5* 32.8* 36.2 40.3  PLT 231 264 252 300 383  MCV 97.9 97.4 96.5 95.0 95.7  MCH 30.1 30.2 30.6 29.9 30.4  MCHC 30.8 31.0 31.7 31.5 31.8  RDW 13.5 13.4 13.4 13.4 14.2  LYMPHSABS  --  1.0 1.6 0.6* 2.9  MONOABS  --  0.7 0.8 0.4 1.5*  EOSABS  --  0.3 0.5 0.0 0.2  BASOSABS  --  0.0 0.0 0.0 0.0    Chemistries   Recent Labs Lab 12/21/14 0440 12/21/14 1120 12/22/14 0406 12/22/14 0938 12/23/14 0515 12/24/14 0527 12/25/14 0544  NA 141 141  --  140 141 139 141  K 3.0* 4.7  --  4.0 3.3* 3.4* 3.5  CL 110 105  --  100 102 94* 92*  CO2 27 31  --  27 32 38* 39*  GLUCOSE 111* 78  --  57* 111* 189* 81  BUN <5* <5*  --  5* <5* 8 15  CREATININE 0.58 0.77  --  1.00 0.75 0.75 0.90  CALCIUM 6.5* 8.0*  --  8.4 8.1* 8.4 8.5  MG 1.9  --  1.9 1.9 1.7 2.4  --   AST 19  --   --  21 18 20  53*  ALT 7  --   --  9 8 9 14   ALKPHOS 43  --   --  56 53 63 60  BILITOT 2.3*  --   --  1.0 0.3 0.3 0.5   ------------------------------------------------------------------------------------------------------------------ estimated creatinine clearance is 44.7 mL/min (by C-G formula based on Cr of 0.9). ------------------------------------------------------------------------------------------------------------------ No results for input(s): HGBA1C in the last 72 hours. ------------------------------------------------------------------------------------------------------------------  Recent Labs  12/23/14 0515  CHOL 81  HDL 26*  LDLCALC 45  TRIG 49  CHOLHDL 3.1    ------------------------------------------------------------------------------------------------------------------ No results for input(s): TSH, T4TOTAL, T3FREE, THYROIDAB in the last 72 hours.  Invalid input(s): FREET3 ------------------------------------------------------------------------------------------------------------------ No results for input(s): VITAMINB12, FOLATE, FERRITIN, TIBC, IRON, RETICCTPCT in the last 72 hours.  Coagulation profile  Recent Labs Lab 12/19/14 0735 12/21/14 1120  INR 1.43 1.21    No results for input(s): DDIMER in the last 72 hours.  Cardiac Enzymes  Recent Labs Lab 12/20/14 1835 12/21/14 0440 12/25/14 0940  TROPONINI 0.78* 0.49* 0.13*   ------------------------------------------------------------------------------------------------------------------ Invalid input(s): POCBNP  No results for input(s): GLUCAP in the last 72 hours.   Domenic Polite M.D. [facility] 12/25/2014, 12:45 PM  Pager: [phone]  Between 7am to 7pm - call Pager - [phone]  After 7pm go to [email or web address] - password [email or web address]  Call night coverage person covering after 7pm

## 2014-12-25 NOTE — Progress Notes (Addendum)
RN paged secondary to pt c/o CP, central sternum, rated 2/10. VSS. No SOB, diaphoresis, radiation of pain or n/v. RN gave NTG and are awaiting effects. Troponin x 1 stat. 12 lead EKG without new acute changes. Report off to rounding MD. She is on a statin and ASA. No BB due to severe COPD. Hx CAD and has had cardiac cath recently. Cards has been following.  Clance Boll, NP

## 2014-12-25 NOTE — Progress Notes (Signed)
Medicare Important Message given? YES  (If response is "NO", the following Medicare IM given date fields will be blank)  Date Medicare IM given: 12/25/14 Medicare IM given by:  Arraya Buck  

## 2014-12-25 NOTE — Progress Notes (Signed)
Pt reported mid sternal chest pain 2/10.  VSS, EKG done and loaded to EPIC.  1 nitro given.  PA Baltazar Najjar notified, new orders given.  Will continue to monitor pt closely.  Claudette Stapler, RN

## 2014-12-25 NOTE — Plan of Care (Signed)
Problem: Phase II Progression Outcomes Goal: Progress activity as tolerated unless otherwise ordered Outcome: Not Progressing Patient still unable to ambulate without oxygen saturation decreasing.

## 2014-12-26 ENCOUNTER — Inpatient Hospital Stay (HOSPITAL_COMMUNITY): Payer: Medicare Other

## 2014-12-26 LAB — CBC WITH DIFFERENTIAL/PLATELET
Basophils Absolute: 0 10*3/uL (ref 0.0–0.1)
Basophils Relative: 0 % (ref 0–1)
Eosinophils Absolute: 0.1 10*3/uL (ref 0.0–0.7)
Eosinophils Relative: 1 % (ref 0–5)
HCT: 40.7 % (ref 36.0–46.0)
Hemoglobin: 13 g/dL (ref 12.0–15.0)
Lymphocytes Relative: 22 % (ref 12–46)
Lymphs Abs: 2.4 10*3/uL (ref 0.7–4.0)
MCH: 30 pg (ref 26.0–34.0)
MCHC: 31.9 g/dL (ref 30.0–36.0)
MCV: 94 fL (ref 78.0–100.0)
Monocytes Absolute: 1.5 10*3/uL — ABNORMAL HIGH (ref 0.1–1.0)
Monocytes Relative: 14 % — ABNORMAL HIGH (ref 3–12)
Neutro Abs: 6.9 10*3/uL (ref 1.7–7.7)
Neutrophils Relative %: 63 % (ref 43–77)
Platelets: 387 10*3/uL (ref 150–400)
RBC: 4.33 MIL/uL (ref 3.87–5.11)
RDW: 14.2 % (ref 11.5–15.5)
WBC: 11 10*3/uL — ABNORMAL HIGH (ref 4.0–10.5)

## 2014-12-26 LAB — COMPREHENSIVE METABOLIC PANEL WITH GFR
ALT: 15 U/L (ref 0–35)
AST: 32 U/L (ref 0–37)
Albumin: 2.8 g/dL — ABNORMAL LOW (ref 3.5–5.2)
Alkaline Phosphatase: 62 U/L (ref 39–117)
Anion gap: 12 (ref 5–15)
BUN: 24 mg/dL — ABNORMAL HIGH (ref 6–23)
CO2: 38 mmol/L — ABNORMAL HIGH (ref 19–32)
Calcium: 8.8 mg/dL (ref 8.4–10.5)
Chloride: 91 mmol/L — ABNORMAL LOW (ref 96–112)
Creatinine, Ser: 0.94 mg/dL (ref 0.50–1.10)
GFR calc Af Amer: 69 mL/min — ABNORMAL LOW
GFR calc non Af Amer: 60 mL/min — ABNORMAL LOW
Glucose, Bld: 96 mg/dL (ref 70–99)
Potassium: 3 mmol/L — ABNORMAL LOW (ref 3.5–5.1)
Sodium: 141 mmol/L (ref 135–145)
Total Bilirubin: 0.4 mg/dL (ref 0.3–1.2)
Total Protein: 5.7 g/dL — ABNORMAL LOW (ref 6.0–8.3)

## 2014-12-26 MED ORDER — PREDNISONE 20 MG PO TABS
40.0000 mg | ORAL_TABLET | Freq: Every day | ORAL | Status: DC
Start: 2014-12-27 — End: 2014-12-28
  Administered 2014-12-27 – 2014-12-28 (×2): 40 mg via ORAL
  Filled 2014-12-26 (×3): qty 2

## 2014-12-26 MED ORDER — POLYETHYLENE GLYCOL 3350 17 G PO PACK
17.0000 g | PACK | Freq: Every day | ORAL | Status: DC | PRN
  Filled 2014-12-26: qty 1

## 2014-12-26 MED ORDER — IPRATROPIUM-ALBUTEROL 0.5-2.5 (3) MG/3ML IN SOLN: 3.0000 mL | Freq: Four times a day (QID) | RESPIRATORY_TRACT | Status: DC | PRN

## 2014-12-26 MED ORDER — POTASSIUM CHLORIDE CRYS ER 20 MEQ PO TBCR
40.0000 meq | EXTENDED_RELEASE_TABLET | Freq: Once | ORAL | Status: AC
  Administered 2014-12-26: 40 meq via ORAL
  Filled 2014-12-26: qty 2

## 2014-12-26 NOTE — Progress Notes (Signed)
Physical Therapy Treatment Patient Details Name: Lindsay Mitchell MRN: [id number] DOB: [birth year] Today's Date: 12/26/2014    History of Present Illness Adm 12/15/14 with abd pain and N/V; +diverticulosis with colonic abscess; treated conservatively/non-surgical; developed hypotension and severe CP; Cardiology consult with MI and PE ruled out; atypical angina PMHx- COPD, CVA, back surgeries x 3, DVT,     PT Comments    Session limited by decreased o2 sats on 3 L/min to 82% with standing.  Pt educated on pursed lip breathing and seated LE therex for continued strengthening.    Follow Up Recommendations  Home health PT;Supervision/Assistance - 24 hour     Equipment Recommendations  Other (comment) (continue to assess)    Recommendations for Other Services OT consult     Precautions / Restrictions Precautions Precautions: Fall Precaution Comments: decr SaO2 Restrictions Weight Bearing Restrictions: No    Mobility  Bed Mobility Overal bed mobility: Needs Assistance Bed Mobility: Supine to Sit     Supine to sit: Supervision     General bed mobility comments: S for safety  Transfers Overall transfer level: Needs assistance Equipment used: Rolling walker (2 wheeled) Transfers: Sit to/from Stand Sit to Stand: Min guard (cues for hand placement)         General transfer comment: decreased posterior lean today.  O2 sat dropped to 82% with standing on 3 L/min.    Ambulation/Gait Ambulation/Gait assistance: Min assist Ambulation Distance (Feet): 5 Feet Assistive device: Rolling walker (2 wheeled) Gait Pattern/deviations: Step-through pattern Gait velocity: quickly   General Gait Details: Amb 5 feet to recliner nd then sat with o2 sats 88% on 4L/min.  Pt demonstrating decreased safety with leaving RW too early.   Stairs            Wheelchair Mobility    Modified Rankin (Stroke Patients Only)       Balance   Sitting-balance support: Feet supported;No  upper extremity supported Sitting balance-Leahy Scale: Good     Standing balance support: Bilateral upper extremity supported Standing balance-Leahy Scale: Poor                      Cognition Arousal/Alertness: Awake/alert Behavior During Therapy: Flat affect;Impulsive Overall Cognitive Status: Within Functional Limits for tasks assessed                      Exercises General Exercises - Lower Extremity Ankle Circles/Pumps: AROM;10 reps;Seated;Both Long Arc Quad: Strengthening;Both;10 reps;Seated Hip ABduction/ADduction: Strengthening;Both;10 reps;Seated Hip Flexion/Marching: Strengthening;Both;10 reps;Seated    General Comments General comments (skin integrity, edema, etc.): Pt educated on pursed lip breathing,  o2 on 3 L/min at exit with o2 89-90%      Pertinent Vitals/Pain Pain Assessment: No/denies pain  Supine 116/67 HR 88 Sitting 90/73 HR 98 Standing 98/62 HR 108    Home Living                      Prior Function            PT Goals (current goals can now be found in the care plan section) Acute Rehab PT Goals Patient Stated Goal: be able to go home ASAP PT Goal Formulation: With patient Time For Goal Achievement: 01/08/15 Potential to Achieve Goals: Good Progress towards PT goals: Not progressing toward goals - comment (o2 limiting session)    Frequency  Min 3X/week    PT Plan Current plan remains appropriate    Co-evaluation  End of Session Equipment Utilized During Treatment: Gait belt;Oxygen Activity Tolerance: Treatment limited secondary to medical complications (Comment) (decreased o2 sat) Patient left: in chair;with call bell/phone within reach;with family/visitor present     Time: 3552-1747 PT Time Calculation (min) (ACUTE ONLY): 23 min  Charges:  $Therapeutic Exercise: 8-22 mins $Therapeutic Activity: 8-22 mins                    G Codes:      Lewis Keats LUBECK 12/26/2014, 12:40 PM

## 2014-12-26 NOTE — Progress Notes (Signed)
[facility]                                                                              Patient Demographics  Lindsay Mitchell, is a 74 y.o. female, DOB - Jan 18, 1941, NKN:[id number]  Admit date - 12/15/2014   Admitting Physician Cristal Ford, DO  Outpatient Primary MD for the patient is Glo Herring., MD  LOS - 11   Chief Complaint  Patient presents with  . Diverticulitis       Brief HPI   Patient is a 74 year old female, with severe COPD, smoker, diverticulosis, fibromyalgia, DVT initially admitted on 4/15 to [facility] with diverticulitis and contained abdominal abscess. She was treated with bowel rest and IV antibiotics, followed by surgery however no surgical intervention was needed. On 4/19 patient developed shortness of breath, chest pain and hypotension. CT angiogram of the chest was negative for pulmonary embolism, troponin was elevated at 1.9.  Repeat CT abdomen on 4/19 had shown improvement in diverticulitis with resolution of diverticular abscess in the sigmoid region. Patient was seen by general surgery and cardiology for consultation.  The patient was transferred to [facility] and was admitted by PCCM due to hypotension and pressors needs.  Patient underwent cardiac cath on 4/21 which showed moderate to heavy three-vessel coronary Patient but no significant obstructive coronary disease, eccentric 40% proximal RCA overall normal LV function with EF of 55%, no regional wall motion abnormalities. Patient was transferred to telemetry floor on 4/22, 4/23: resp distress, improved with lasix and nebs seen by Dr.Rai 4/24 on Bode 8: resp improving, still with dyspnea  Assessment & Plan    Acute hypoxic respiratory failure with severe underlying COPD and, likely volume overload - due to fluid overload and COPD - 1.4L negative yesterday - ECHO with normal EF - continue IV lasix 40mg  q12 today, FU CXR  - prednisone taper  - continued scheduled  DuoNeb's q 4 hours, Symbicort - Outpatient sleep study or CPAP at night and outpatient pulmonology follow-up   Acute diverticulitis with Colonic diverticular abscess - Repeat CT scan 4/19 had shown improving diverticulitis with resolution of abscess,  - completed Zosyn course. - Continue solid diet as tolerated - Fu with CCS  C. difficile colitis/diarrhea - continue oral Flagyl -constipation : improved with laxatives  Constipation -resolved -cut down miralax and senokot  Nicotine abuse - Counseled the patient on nicotine cessation  Hypertension - Continue lisinopril  Mild acute renal supple insufficiency at the time of admission on 4/19, creatinine was 1.12- resolved - resolved  Severe protein calorie malnutrition - Nutrition consult placed  Cad/ chest pain with elevated troponin/nstemi - CT angiogram of the chest was negative for pulmonary embolism - Cardiac cath was done on 12/21/14 which showed moderate to heavy three-vessel coronary Patient but no significant obstructive coronary disease, eccentric 40% proximal RCA overall normal LV function with EF of 55%, no regional wall motion abnormalities. - nitro PRN  Hypokalemia, hypomagnesemia - Replaced   Pulmonary nodules  - known since 2014, FU CT   Code Status: Full CODE STATUS  Family Communication: spouse at bedside Disposition Plan: Currently stable, will continue monitoring  on telemetry floor, hom ein 1-2days  Time Spent in minutes   35 minutes  Procedures  CT abd/pelvis 4/19>>> improvement in diverticulitis, resolution of diverticular abscess. Interval development of pericholecystic fluid, no gallstones or gallbladder thickening  CTA chest 4/19>>> neg PE, small bilat effusions  2D echo 4/19>>> EF 60-65%, mod dilated RV, PA peak pressure 60mmHg,  4/21 left heart catheterization;-Moderate to heavy three-vessel coronary calcification -LVEF= 55%   Consults   Coronary critical care  Gen. surgery  Cardiology     DVT Prophylaxis  heparin   Medications  Scheduled Meds: . aspirin  81 mg Oral Daily  . atorvastatin  80 mg Oral q1800  . budesonide-formoterol  2 puff Inhalation BID  . furosemide  40 mg Intravenous Q12H  . gabapentin  900 mg Oral TID  . heparin  5,000 Units Subcutaneous 3 times per day  . ipratropium-albuterol  3 mL Nebulization TID  . lisinopril  5 mg Oral Daily  . metroNIDAZOLE  500 mg Oral 3 times per day  . potassium chloride  40 mEq Oral Once  . [START ON 12/27/2014] predniSONE  40 mg Oral Q breakfast  . senna-docusate  1 tablet Oral BID  . sodium chloride  3 mL Intravenous Q12H   Continuous Infusions:  PRN Meds:.sodium chloride, albuterol, HYDROcodone-acetaminophen, nitroGLYCERIN, polyethylene glycol, sodium chloride, sodium chloride   Antibiotics   Anti-infectives    Start     Dose/Rate Route Frequency Ordered Stop   12/21/14 1700  metroNIDAZOLE (FLAGYL) tablet 500 mg     500 mg Oral 3 times per day 12/21/14 1607     12/19/14 2100  vancomycin (VANCOCIN) 500 mg in sodium chloride 0.9 % 100 mL IVPB  Status:  Discontinued     500 mg 100 mL/hr over 60 Minutes Intravenous Every 12 hours 12/19/14 0934 12/21/14 1446   12/19/14 1600  piperacillin-tazobactam (ZOSYN) IVPB 3.375 g  Status:  Discontinued     3.375 g 12.5 mL/hr over 240 Minutes Intravenous Every 8 hours 12/19/14 0805 12/19/14 0923   12/19/14 1000  piperacillin-tazobactam (ZOSYN) IVPB 3.375 g  Status:  Discontinued     3.375 g 12.5 mL/hr over 240 Minutes Intravenous Every 8 hours 12/19/14 0923 12/22/14 0855   12/19/14 0900  vancomycin (VANCOCIN) 1,250 mg in sodium chloride 0.9 % 250 mL IVPB     1,250 mg 166.7 mL/hr over 90 Minutes Intravenous  Once 12/19/14 0804 12/19/14 1029   12/19/14 0700  piperacillin-tazobactam (ZOSYN) IVPB 3.375 g  Status:  Discontinued     3.375 g 100 mL/hr over 30 Minutes Intravenous  Once 12/19/14 0659 12/19/14 0924   12/16/14 0400  ciprofloxacin (CIPRO) IVPB 400 mg  Status:   Discontinued     400 mg 200 mL/hr over 60 Minutes Intravenous Every 12 hours 12/15/14 1837 12/19/14 0659   12/16/14 0100  metroNIDAZOLE (FLAGYL) IVPB 500 mg  Status:  Discontinued     500 mg 100 mL/hr over 60 Minutes Intravenous Every 8 hours 12/15/14 1837 12/19/14 0659   12/15/14 1515  metroNIDAZOLE (FLAGYL) IVPB 500 mg     500 mg 100 mL/hr over 60 Minutes Intravenous  Once 12/15/14 1506 12/15/14 1733   12/15/14 1515  ciprofloxacin (CIPRO) IVPB 400 mg     400 mg 200 mL/hr over 60 Minutes Intravenous  Once 12/15/14 1506 12/15/14 1631        Subjective:   Jovita Gamma breathing better, multiple Bms yesterday after laxatives    Objective:   Blood pressure 104/64, pulse  83, temperature 98.3 F (36.8 C), temperature source Oral, resp. rate 18, height 5\' 3"  (1.6 m), weight 48.036 kg (105 lb 14.4 oz), SpO2 94 %.  Wt Readings from Last 3 Encounters:  12/26/14 48.036 kg (105 lb 14.4 oz)  11/23/14 51.529 kg (113 lb 9.6 oz)  11/07/14 50.349 kg (111 lb)     Intake/Output Summary (Last 24 hours) at 12/26/14 0929 Last data filed at 12/26/14 0615  Gross per 24 hour  Intake    540 ml  Output   1151 ml  Net   -611 ml    Exam  General: AAOx3, mild distress after walking  HEENT:  PERRLA, EOMI, Anicteic Sclera, mucous membranes moist.   Neck: Supple, no JVD, no masses  CVS: S1 S2 auscultated, no rubs, murmurs or gallops. Regular rate and rhythm.  Respiratory:basilar crackles  Abdomen: Soft,  NT, ND, nondistended, + bowel sounds  Ext: no cyanosis clubbing or edema  Neuro: Cr N's II- XII. Strength 5/5 upper and lower extremities bilaterally  Skin: No rashes   Data Review   Micro Results Recent Results (from the past 240 hour(s))  MRSA PCR Screening     Status: None   Collection Time: 12/19/14  5:35 AM  Result Value Ref Range Status   MRSA by PCR NEGATIVE NEGATIVE Final    Comment:        The GeneXpert MRSA Assay (FDA approved for NASAL specimens only), is one  component of a comprehensive MRSA colonization surveillance program. It is not intended to diagnose MRSA infection nor to guide or monitor treatment for MRSA infections.   Culture, blood (x 2)     Status: None   Collection Time: 12/19/14  7:35 AM  Result Value Ref Range Status   Specimen Description BLOOD RIGHT WRIST  Final   Special Requests BOTTLES DRAWN AEROBIC AND ANAEROBIC 8CC  Final   Culture NO GROWTH 5 DAYS  Final   Report Status 12/24/2014 FINAL  Final  Culture, blood (x 2)     Status: None   Collection Time: 12/19/14  7:40 AM  Result Value Ref Range Status   Specimen Description BLOOD LEFT HAND  Final   Special Requests   Final    BOTTLES DRAWN AEROBIC AND ANAEROBIC AEB=8CC ANA=6CC   Culture NO GROWTH 5 DAYS  Final   Report Status 12/24/2014 FINAL  Final  Clostridium Difficile by PCR     Status: Abnormal   Collection Time: 12/21/14 12:53 PM  Result Value Ref Range Status   C difficile by pcr POSITIVE (A) NEGATIVE Final    Comment: CRITICAL RESULT CALLED TO, READ BACK BY AND VERIFIED WITH: Garwin Brothers RN 16:00 12/21/14 ([email or web address])     Radiology Reports Dg Chest 2 View  12/18/2014   CLINICAL DATA:  Left-sided rib pain and cough.  Shortness of breath.  EXAM: CHEST  2 VIEW  COMPARISON:  12/13/2014 abdominal CT, 04/28/2014 radiograph  FINDINGS: Small left pleural effusion and associated airspace opacity. Background interstitial coarsening and hyperinflation. Aortic atherosclerosis. Normal heart size. Osteopenia and multilevel degenerative changes.  IMPRESSION: Small left pleural effusion and associated airspace opacity; atelectasis versus pneumonia. Recommend a repeat radiograph in 3-4 weeks to document resolution.  Background COPD.   Electronically Signed   By: Carlos Levering M.D.   On: 12/18/2014 06:53   Ct Angio Chest Pe W/cm &/or Wo Cm  12/19/2014   CLINICAL DATA:  Shortness of breath. Recent surgery for diverticulitis. Chest pain started last night.  EXAM: CT ANGIOGRAPHY  CHEST WITH CONTRAST  TECHNIQUE: Multidetector CT imaging of the chest was performed using the standard protocol during bolus administration of intravenous contrast. Multiplanar CT image reconstructions and MIPs were obtained to evaluate the vascular anatomy.  CONTRAST:  72mL OMNIPAQUE IOHEXOL 350 MG/ML SOLN  COMPARISON:  None.  FINDINGS: There is adequate opacification of the pulmonary arteries. There is no pulmonary embolus. The main pulmonary artery, right main pulmonary artery and left main pulmonary arteries are normal in size. The heart size is mildly enlarged. There is no pericardial effusion. There is coronary artery atherosclerosis in the LAD, circumflex and RCA.  There are bilateral small pleural effusions. There is bilateral compressive atelectasis, left greater than right. There is bilateral centrilobular emphysema. There is no pneumothorax.  There is no axillary, hilar, or mediastinal adenopathy.  There is no lytic or blastic osseous lesion.  The visualized portions of the upper abdomen are unremarkable.  Review of the MIP images confirms the above findings.  IMPRESSION: 1. No evidence pulmonary embolus. 2. Bilateral small pleural effusions and compressive atelectasis. 3. Bilateral centrilobular emphysema.   Electronically Signed   By: Kathreen Devoid   On: 12/19/2014 16:17   Ct Abdomen Pelvis W Contrast  12/19/2014   CLINICAL DATA:  Follow-up diverticular abscess.  Hypotension.  EXAM: CT ABDOMEN AND PELVIS WITH CONTRAST  TECHNIQUE: Multidetector CT imaging of the abdomen and pelvis was performed using the standard protocol following bolus administration of intravenous contrast.  CONTRAST:  171mL OMNIPAQUE IOHEXOL 300 MG/ML  SOLN  COMPARISON:  CT abdomen pelvis 12/13/2011  FINDINGS: Interval resolution of rim enhancing fluid collection posterior to the sigmoid colon compatible with resolution of abscess. Sigmoid diverticular change noted in this area with some thickening of the bowel which also has  improved. Findings are consistent with resolving diverticulitis and abscess. No abscess or free fluid on today's study. Negative for bowel obstruction.  Small bilateral pleural effusions with bibasilar atelectasis have developed since the prior study  Focal fatty infiltration of the left lobe of the liver adjacent to the falciform ligament unchanged. Pericholecystic fluid has developed since the prior study however the gallbladder wall is not thickened and no gallstones identified. Correlate with right upper quadrant pain and ultrasound if clinically indicated. Spleen is normal. Pancreas is atrophic without edema or mass  Negative for renal obstruction or mass. Multiple renal cysts. Largest cyst left lower pole measuring 7 cm  Atherosclerotic aorta without aneurysm. Prior hysterectomy. No adenopathy.  Moderate disc degeneration and spurring in the lower lumbar spine. Negative for fracture  IMPRESSION: Improvement in diverticulitis. Resolution of diverticular abscess in the sigmoid region.  Interval development of pericholecystic fluid. No gallbladder thickening or gallstones. Correlate with pain in this area and possible ultrasound if indicated  Interval development of bilateral pleural effusions and bibasilar atelectasis.   Electronically Signed   By: Franchot Gallo M.D.   On: 12/19/2014 11:28   Ct Abdomen Pelvis W Contrast  12/13/2014   CLINICAL DATA:  Abdominal pain. Evaluate for diverticulitis or perforated bowel.  EXAM: CT ABDOMEN AND PELVIS WITH CONTRAST  TECHNIQUE: Multidetector CT imaging of the abdomen and pelvis was performed using the standard protocol following bolus administration of intravenous contrast.  CONTRAST:  170mL OMNIPAQUE IOHEXOL 300 MG/ML  SOLN  COMPARISON:  05/23/2013  FINDINGS: Lung bases are clear.  No effusions.  Heart is normal size.  Liver, gallbladder, spleen, pancreas and adrenals are unremarkable. Bilateral renal cysts, the largest in the left lower pole measuring up to 7 cm,  similar to prior study. No hydronephrosis.  Aorta is heavily calcified, non aneurysmal.  Prior hysterectomy. Urinary bladder is decompressed, grossly unremarkable.  Extensive sigmoid diverticulosis. There is a small fluid collection adjacent to the sigmoid colon measuring 2 cm. It is unclear if this represents a small cyst within the left ovary for possible small fluid collection adjacent to the sigmoid colon related to diverticulitis. This was not present in 2014.  Stomach and small bowel are unremarkable. No free fluid, free air or adenopathy.  No acute bony abnormality or focal bone lesion.  IMPRESSION: Sigmoid diverticulosis. 2 cm fluid collection adjacent to the sigmoid colon in the left pelvis. While this could reflect a small fluid collection/abscess related to diverticulitis, this also may represent a small cystic structure within the left ovary, but is new since 2014. I favor this is a small diverticular abscess. Consider treatment and repeat short-term imaging.  Bilateral renal cysts.   Electronically Signed   By: Rolm Baptise M.D.   On: 12/13/2014 12:34   Dg Chest Port 1 View  12/23/2014   CLINICAL DATA:  Hypoxia  EXAM: PORTABLE CHEST - 1 VIEW  COMPARISON:  12/19/2014  FINDINGS: Bilateral pleural effusions partly obscures a hemidiaphragms. Lungs are hyperexpanded. No lung consolidation or edema.  Cardiac silhouette is borderline enlarged. No mediastinal or hilar masses.  Right PICC tip projects in the lower superior vena cava.  IMPRESSION: 1. Persistent bilateral pleural effusions. 2. No convincing pneumonia. No pulmonary edema. Stable changes of emphysema. 3. Right PICC is well positioned, new from the prior chest radiograph.   Electronically Signed   By: Lajean Manes M.D.   On: 12/23/2014 08:52   Dg Chest Port 1 View  12/19/2014   CLINICAL DATA:  Cough, shortness of breath.  EXAM: PORTABLE CHEST - 1 VIEW  COMPARISON:  12/18/2014  FINDINGS: Interstitial coarsening. Increased bibasilar opacities.  There may be small layering pleural effusions. Aortic atherosclerosis. Enlarged cardiac silhouette. Central vascular congestion. No pneumothorax. Osteopenia.  IMPRESSION: Small pleural effusions and increased bibasilar opacities which may reflect atelectasis, aspiration, or pneumonia.  Background COPD.  Recommend radiograph follow-up in 3-4 weeks to document resolution.   Electronically Signed   By: Carlos Levering M.D.   On: 12/19/2014 07:40    CBC  Recent Labs Lab 12/22/14 0938 12/23/14 0515 12/24/14 0527 12/25/14 0544 12/26/14 0405  WBC 5.5 5.8 9.6 13.2* 11.0*  HGB 10.4* 10.4* 11.4* 12.8 13.0  HCT 33.5* 32.8* 36.2 40.3 40.7  PLT 264 252 300 383 387  MCV 97.4 96.5 95.0 95.7 94.0  MCH 30.2 30.6 29.9 30.4 30.0  MCHC 31.0 31.7 31.5 31.8 31.9  RDW 13.4 13.4 13.4 14.2 14.2  LYMPHSABS 1.0 1.6 0.6* 2.9 2.4  MONOABS 0.7 0.8 0.4 1.5* 1.5*  EOSABS 0.3 0.5 0.0 0.2 0.1  BASOSABS 0.0 0.0 0.0 0.0 0.0    Chemistries   Recent Labs Lab 12/21/14 0440  12/22/14 0406 12/22/14 0938 12/23/14 0515 12/24/14 0527 12/25/14 0544 12/26/14 0405  NA 141  < >  --  140 141 139 141 141  K 3.0*  < >  --  4.0 3.3* 3.4* 3.5 3.0*  CL 110  < >  --  100 102 94* 92* 91*  CO2 27  < >  --  27 32 38* 39* 38*  GLUCOSE 111*  < >  --  57* 111* 189* 81 96  BUN <5*  < >  --  5* <5* 8 15 24*  CREATININE 0.58  < >  --  1.00 0.75 0.75 0.90 0.94  CALCIUM 6.5*  < >  --  8.4 8.1* 8.4 8.5 8.8  MG 1.9  --  1.9 1.9 1.7 2.4  --   --   AST 19  --   --  21 18 20  53* 32  ALT 7  --   --  9 8 9 14 15   ALKPHOS 43  --   --  56 53 63 60 62  BILITOT 2.3*  --   --  1.0 0.3 0.3 0.5 0.4  < > = values in this interval not displayed. ------------------------------------------------------------------------------------------------------------------ estimated creatinine clearance is 41.6 mL/min (by C-G formula based on Cr of  0.94). ------------------------------------------------------------------------------------------------------------------ No results for input(s): HGBA1C in the last 72 hours. ------------------------------------------------------------------------------------------------------------------ No results for input(s): CHOL, HDL, LDLCALC, TRIG, CHOLHDL, LDLDIRECT in the last 72 hours. ------------------------------------------------------------------------------------------------------------------ No results for input(s): TSH, T4TOTAL, T3FREE, THYROIDAB in the last 72 hours.  Invalid input(s): FREET3 ------------------------------------------------------------------------------------------------------------------ No results for input(s): VITAMINB12, FOLATE, FERRITIN, TIBC, IRON, RETICCTPCT in the last 72 hours.  Coagulation profile  Recent Labs Lab 12/21/14 1120  INR 1.21    No results for input(s): DDIMER in the last 72 hours.  Cardiac Enzymes  Recent Labs Lab 12/20/14 1835 12/21/14 0440 12/25/14 0940  TROPONINI 0.78* 0.49* 0.13*   ------------------------------------------------------------------------------------------------------------------ Invalid input(s): POCBNP  No results for input(s): GLUCAP in the last 72 hours.   Domenic Polite M.D. [facility] 12/26/2014, 9:29 AM  Pager: [phone]  Between 7am to 7pm - call Pager - [phone]  After 7pm go to [email or web address] - password [email or web address]  Call night coverage person covering after 7pm

## 2014-12-26 NOTE — Progress Notes (Signed)
Utilization review completed.  

## 2014-12-27 LAB — CBC WITH DIFFERENTIAL/PLATELET
Basophils Absolute: 0 10*3/uL (ref 0.0–0.1)
Basophils Relative: 0 % (ref 0–1)
Eosinophils Absolute: 0.1 10*3/uL (ref 0.0–0.7)
Eosinophils Relative: 1 % (ref 0–5)
HCT: 40.7 % (ref 36.0–46.0)
Hemoglobin: 13 g/dL (ref 12.0–15.0)
Lymphocytes Relative: 20 % (ref 12–46)
Lymphs Abs: 1.8 10*3/uL (ref 0.7–4.0)
MCH: 29.9 pg (ref 26.0–34.0)
MCHC: 31.9 g/dL (ref 30.0–36.0)
MCV: 93.6 fL (ref 78.0–100.0)
Monocytes Absolute: 0.9 10*3/uL (ref 0.1–1.0)
Monocytes Relative: 10 % (ref 3–12)
Neutro Abs: 6.3 10*3/uL (ref 1.7–7.7)
Neutrophils Relative %: 69 % (ref 43–77)
Platelets: 393 10*3/uL (ref 150–400)
RBC: 4.35 MIL/uL (ref 3.87–5.11)
RDW: 14.3 % (ref 11.5–15.5)
WBC: 9.1 10*3/uL (ref 4.0–10.5)

## 2014-12-27 LAB — COMPREHENSIVE METABOLIC PANEL
ALT: 11 U/L (ref 0–35)
AST: 22 U/L (ref 0–37)
Albumin: 2.8 g/dL — ABNORMAL LOW (ref 3.5–5.2)
Alkaline Phosphatase: 61 U/L (ref 39–117)
Anion gap: 10 (ref 5–15)
BUN: 43 mg/dL — ABNORMAL HIGH (ref 6–23)
CO2: 34 mmol/L — ABNORMAL HIGH (ref 19–32)
Calcium: 8.6 mg/dL (ref 8.4–10.5)
Chloride: 93 mmol/L — ABNORMAL LOW (ref 96–112)
Creatinine, Ser: 1.24 mg/dL — ABNORMAL HIGH (ref 0.50–1.10)
GFR calc Af Amer: 49 mL/min — ABNORMAL LOW (ref >90)
GFR calc non Af Amer: 43 mL/min — ABNORMAL LOW (ref >90)
Glucose, Bld: 111 mg/dL — ABNORMAL HIGH (ref 70–99)
Potassium: 3.7 mmol/L (ref 3.5–5.1)
Sodium: 137 mmol/L (ref 135–145)
Total Bilirubin: 0.6 mg/dL (ref 0.3–1.2)
Total Protein: 5.2 g/dL — ABNORMAL LOW (ref 6.0–8.3)

## 2014-12-27 NOTE — Progress Notes (Signed)
Dr. Baltazar Najjar aware of BP, since patient asymptomatic at this time will continue to monitor BP closely, no new orders at this time

## 2014-12-27 NOTE — Progress Notes (Signed)
[facility]                                                                              Patient Demographics  Lindsay Mitchell, is a 74 y.o. female, DOB - [birth year], ZRA:[id number]  Admit date - 12/15/2014   Admitting Physician Cristal Ford, DO  Outpatient Primary MD for the patient is Glo Herring., MD  LOS - 12   Chief Complaint  Patient presents with  . Diverticulitis       Brief HPI   Patient is a 74 year old female, with severe COPD, smoker, diverticulosis, fibromyalgia, DVT initially admitted on 4/15 to [facility] with diverticulitis and contained abdominal abscess. She was treated with bowel rest and IV antibiotics, followed by surgery however no surgical intervention was needed. On 4/19 patient developed shortness of breath, chest pain and hypotension. CT angiogram of the chest was negative for pulmonary embolism, troponin was elevated at 1.9.  Repeat CT abdomen on 4/19 had shown improvement in diverticulitis with resolution of diverticular abscess in the sigmoid region. Patient was seen by general surgery and cardiology for consultation.  The patient was transferred to [facility] and was admitted by PCCM due to hypotension and pressors needs.  Patient underwent cardiac cath on 4/21 which showed moderate to heavy three-vessel coronary Patient but no significant obstructive coronary disease, eccentric 40% proximal RCA overall normal LV function with EF of 55%, no regional wall motion abnormalities. Patient was transferred to telemetry floor on 4/22, 4/23: resp distress, improved with lasix and nebs seen by Dr.Rai 4/24 on [place] 8: resp improving, still with dyspnea  Assessment & Plan    Acute hypoxic respiratory failure with severe underlying COPD and, likely volume overload - due to fluid overload and COPD - 1.4L negative yesterday - ECHO with normal EF - continue IV lasix 40mg  q12 today, FU CXR  - prednisone taper  - continued scheduled  DuoNeb's q 4 hours, Symbicort - Outpatient sleep study or CPAP at night and outpatient pulmonology follow-up   Acute diverticulitis with Colonic diverticular abscess - Repeat CT scan 4/19 had shown improving diverticulitis with resolution of abscess,  - completed Zosyn course. - Continue solid diet as tolerated - Fu with CCS  C. difficile colitis/diarrhea - continue oral Flagyl, patient still having multiple stools but more formed Stopped Mira lax and senna  Constipation -resolved Discontinue miralax and senokot  Nicotine abuse - Counseled the patient on nicotine cessation  Hypertension Discontinued lisinopril due to soft blood pressure  Mild acute renal supple insufficiency at the time of admission on 4/19, creatinine was 1.12- resolved - resolved  Severe protein calorie malnutrition - Nutrition consult placed  Cad/ chest pain with elevated troponin/nstemi - CT angiogram of the chest was negative for pulmonary embolism - Cardiac cath was done on 12/21/14 which showed moderate to heavy three-vessel coronary Patient but no significant obstructive coronary disease, eccentric 40% proximal RCA overall normal LV function with EF of 55%, no regional wall motion abnormalities. - nitro PRN  Hypokalemia, hypomagnesemia - Replaced   Pulmonary nodules  - known since 2014, FU CT   Code Status: Full CODE STATUS  Family  Communication: spouse at bedside Disposition Plan: Patient will discharge home when oxygen requirements improved   Time Spent in minutes   35 minutes  Procedures  CT abd/pelvis 4/19>>> improvement in diverticulitis, resolution of diverticular abscess. Interval development of pericholecystic fluid, no gallstones or gallbladder thickening  CTA chest 4/19>>> neg PE, small bilat effusions  2D echo 4/19>>> EF 60-65%, mod dilated RV, PA peak pressure 22mmHg,  4/21 left heart catheterization;-Moderate to heavy three-vessel coronary calcification -LVEF=  55%   Consults   Coronary critical care  Gen. surgery  Cardiology   DVT Prophylaxis  heparin   Medications  Scheduled Meds: . aspirin  81 mg Oral Daily  . atorvastatin  80 mg Oral q1800  . budesonide-formoterol  2 puff Inhalation BID  . furosemide  40 mg Intravenous Q12H  . gabapentin  900 mg Oral TID  . heparin  5,000 Units Subcutaneous 3 times per day  . metroNIDAZOLE  500 mg Oral 3 times per day  . predniSONE  40 mg Oral Q breakfast  . sodium chloride  3 mL Intravenous Q12H   Continuous Infusions:  PRN Meds:.sodium chloride, albuterol, HYDROcodone-acetaminophen, ipratropium-albuterol, nitroGLYCERIN, sodium chloride, sodium chloride   Antibiotics   Anti-infectives    Start     Dose/Rate Route Frequency Ordered Stop   12/21/14 1700  metroNIDAZOLE (FLAGYL) tablet 500 mg     500 mg Oral 3 times per day 12/21/14 1607     12/19/14 2100  vancomycin (VANCOCIN) 500 mg in sodium chloride 0.9 % 100 mL IVPB  Status:  Discontinued     500 mg 100 mL/hr over 60 Minutes Intravenous Every 12 hours 12/19/14 0934 12/21/14 1446   12/19/14 1600  piperacillin-tazobactam (ZOSYN) IVPB 3.375 g  Status:  Discontinued     3.375 g 12.5 mL/hr over 240 Minutes Intravenous Every 8 hours 12/19/14 0805 12/19/14 0923   12/19/14 1000  piperacillin-tazobactam (ZOSYN) IVPB 3.375 g  Status:  Discontinued     3.375 g 12.5 mL/hr over 240 Minutes Intravenous Every 8 hours 12/19/14 0923 12/22/14 0855   12/19/14 0900  vancomycin (VANCOCIN) 1,250 mg in sodium chloride 0.9 % 250 mL IVPB     1,250 mg 166.7 mL/hr over 90 Minutes Intravenous  Once 12/19/14 0804 12/19/14 1029   12/19/14 0700  piperacillin-tazobactam (ZOSYN) IVPB 3.375 g  Status:  Discontinued     3.375 g 100 mL/hr over 30 Minutes Intravenous  Once 12/19/14 0659 12/19/14 0924   12/16/14 0400  ciprofloxacin (CIPRO) IVPB 400 mg  Status:  Discontinued     400 mg 200 mL/hr over 60 Minutes Intravenous Every 12 hours 12/15/14 1837 12/19/14 0659    12/16/14 0100  metroNIDAZOLE (FLAGYL) IVPB 500 mg  Status:  Discontinued     500 mg 100 mL/hr over 60 Minutes Intravenous Every 8 hours 12/15/14 1837 12/19/14 0659   12/15/14 1515  metroNIDAZOLE (FLAGYL) IVPB 500 mg     500 mg 100 mL/hr over 60 Minutes Intravenous  Once 12/15/14 1506 12/15/14 1733   12/15/14 1515  ciprofloxacin (CIPRO) IVPB 400 mg     400 mg 200 mL/hr over 60 Minutes Intravenous  Once 12/15/14 1506 12/15/14 1631        Subjective:   Jovita Gamma breathing better, multiple Bms  Patient requesting stool softener Still requiring 4 L of oxygen   Objective:   Blood pressure 90/48, pulse 84, temperature 98 F (36.7 C), temperature source Oral, resp. rate 18, height 5\' 3"  (1.6 m), weight 49 kg (108 lb  0.4 oz), SpO2 96 %.  Wt Readings from Last 3 Encounters:  12/27/14 49 kg (108 lb 0.4 oz)  11/23/14 51.529 kg (113 lb 9.6 oz)  11/07/14 50.349 kg (111 lb)     Intake/Output Summary (Last 24 hours) at 12/27/14 1717 Last data filed at 12/27/14 1230  Gross per 24 hour  Intake    640 ml  Output    601 ml  Net     39 ml    Exam  General: AAOx3, mild distress after walking  HEENT:  PERRLA, EOMI, Anicteic Sclera, mucous membranes moist.   Neck: Supple, no JVD, no masses  CVS: S1 S2 auscultated, no rubs, murmurs or gallops. Regular rate and rhythm.  Respiratory:basilar crackles  Abdomen: Soft,  NT, ND, nondistended, + bowel sounds  Ext: no cyanosis clubbing or edema  Neuro: Cr N's II- XII. Strength 5/5 upper and lower extremities bilaterally  Skin: No rashes   Data Review   Micro Results Recent Results (from the past 240 hour(s))  MRSA PCR Screening     Status: None   Collection Time: 12/19/14  5:35 AM  Result Value Ref Range Status   MRSA by PCR NEGATIVE NEGATIVE Final    Comment:        The GeneXpert MRSA Assay (FDA approved for NASAL specimens only), is one component of a comprehensive MRSA colonization surveillance program. It is  not intended to diagnose MRSA infection nor to guide or monitor treatment for MRSA infections.   Culture, blood (x 2)     Status: None   Collection Time: 12/19/14  7:35 AM  Result Value Ref Range Status   Specimen Description BLOOD RIGHT WRIST  Final   Special Requests BOTTLES DRAWN AEROBIC AND ANAEROBIC 8CC  Final   Culture NO GROWTH 5 DAYS  Final   Report Status 12/24/2014 FINAL  Final  Culture, blood (x 2)     Status: None   Collection Time: 12/19/14  7:40 AM  Result Value Ref Range Status   Specimen Description BLOOD LEFT HAND  Final   Special Requests   Final    BOTTLES DRAWN AEROBIC AND ANAEROBIC AEB=8CC ANA=6CC   Culture NO GROWTH 5 DAYS  Final   Report Status 12/24/2014 FINAL  Final  Clostridium Difficile by PCR     Status: Abnormal   Collection Time: 12/21/14 12:53 PM  Result Value Ref Range Status   C difficile by pcr POSITIVE (A) NEGATIVE Final    Comment: CRITICAL RESULT CALLED TO, READ BACK BY AND VERIFIED WITH: Garwin Brothers RN 16:00 12/21/14 ([email or web address])     Radiology Reports Dg Chest 2 View  12/26/2014   CLINICAL DATA:  Hypoxia  EXAM: CHEST  2 VIEW  COMPARISON:  Portable view of the chest 12/23/2014 and CT chest 12/19/2014  FINDINGS: Right upper extremity PICC terminates in the mid superior vena cava. Cardiac leads project over the chest. Heart size is mildly enlarged and appears stable. Pulmonary vascularity is normal. There is bibasilar atelectasis and there are small bilateral pleural effusions. Aeration of the lung bases appear slightly improved compared to the chest radiograph dated 12/23/2014. No acute bony abnormality.  IMPRESSION: Bibasilar atelectasis and small bilateral pleural effusions. Improving aeration of the lung bases compared to recent prior.   Electronically Signed   By: Curlene Dolphin M.D.   On: 12/26/2014 14:36   Dg Chest 2 View  12/18/2014   CLINICAL DATA:  Left-sided rib pain and cough.  Shortness of breath.  EXAM: CHEST  2 VIEW  COMPARISON:  12/13/2014  abdominal CT, 04/28/2014 radiograph  FINDINGS: Small left pleural effusion and associated airspace opacity. Background interstitial coarsening and hyperinflation. Aortic atherosclerosis. Normal heart size. Osteopenia and multilevel degenerative changes.  IMPRESSION: Small left pleural effusion and associated airspace opacity; atelectasis versus pneumonia. Recommend a repeat radiograph in 3-4 weeks to document resolution.  Background COPD.   Electronically Signed   By: Carlos Levering M.D.   On: 12/18/2014 06:53   Ct Angio Chest Pe W/cm &/or Wo Cm  12/19/2014   CLINICAL DATA:  Shortness of breath. Recent surgery for diverticulitis. Chest pain started last night.  EXAM: CT ANGIOGRAPHY CHEST WITH CONTRAST  TECHNIQUE: Multidetector CT imaging of the chest was performed using the standard protocol during bolus administration of intravenous contrast. Multiplanar CT image reconstructions and MIPs were obtained to evaluate the vascular anatomy.  CONTRAST:  96mL OMNIPAQUE IOHEXOL 350 MG/ML SOLN  COMPARISON:  None.  FINDINGS: There is adequate opacification of the pulmonary arteries. There is no pulmonary embolus. The main pulmonary artery, right main pulmonary artery and left main pulmonary arteries are normal in size. The heart size is mildly enlarged. There is no pericardial effusion. There is coronary artery atherosclerosis in the LAD, circumflex and RCA.  There are bilateral small pleural effusions. There is bilateral compressive atelectasis, left greater than right. There is bilateral centrilobular emphysema. There is no pneumothorax.  There is no axillary, hilar, or mediastinal adenopathy.  There is no lytic or blastic osseous lesion.  The visualized portions of the upper abdomen are unremarkable.  Review of the MIP images confirms the above findings.  IMPRESSION: 1. No evidence pulmonary embolus. 2. Bilateral small pleural effusions and compressive atelectasis. 3. Bilateral centrilobular emphysema.    Electronically Signed   By: Kathreen Devoid   On: 12/19/2014 16:17   Ct Abdomen Pelvis W Contrast  12/19/2014   CLINICAL DATA:  Follow-up diverticular abscess.  Hypotension.  EXAM: CT ABDOMEN AND PELVIS WITH CONTRAST  TECHNIQUE: Multidetector CT imaging of the abdomen and pelvis was performed using the standard protocol following bolus administration of intravenous contrast.  CONTRAST:  122mL OMNIPAQUE IOHEXOL 300 MG/ML  SOLN  COMPARISON:  CT abdomen pelvis 12/13/2011  FINDINGS: Interval resolution of rim enhancing fluid collection posterior to the sigmoid colon compatible with resolution of abscess. Sigmoid diverticular change noted in this area with some thickening of the bowel which also has improved. Findings are consistent with resolving diverticulitis and abscess. No abscess or free fluid on today's study. Negative for bowel obstruction.  Small bilateral pleural effusions with bibasilar atelectasis have developed since the prior study  Focal fatty infiltration of the left lobe of the liver adjacent to the falciform ligament unchanged. Pericholecystic fluid has developed since the prior study however the gallbladder wall is not thickened and no gallstones identified. Correlate with right upper quadrant pain and ultrasound if clinically indicated. Spleen is normal. Pancreas is atrophic without edema or mass  Negative for renal obstruction or mass. Multiple renal cysts. Largest cyst left lower pole measuring 7 cm  Atherosclerotic aorta without aneurysm. Prior hysterectomy. No adenopathy.  Moderate disc degeneration and spurring in the lower lumbar spine. Negative for fracture  IMPRESSION: Improvement in diverticulitis. Resolution of diverticular abscess in the sigmoid region.  Interval development of pericholecystic fluid. No gallbladder thickening or gallstones. Correlate with pain in this area and possible ultrasound if indicated  Interval development of bilateral pleural effusions and bibasilar atelectasis.    Electronically Signed   By: Juanda Crumble  Carlis Abbott M.D.   On: 12/19/2014 11:28   Ct Abdomen Pelvis W Contrast  12/13/2014   CLINICAL DATA:  Abdominal pain. Evaluate for diverticulitis or perforated bowel.  EXAM: CT ABDOMEN AND PELVIS WITH CONTRAST  TECHNIQUE: Multidetector CT imaging of the abdomen and pelvis was performed using the standard protocol following bolus administration of intravenous contrast.  CONTRAST:  134mL OMNIPAQUE IOHEXOL 300 MG/ML  SOLN  COMPARISON:  05/23/2013  FINDINGS: Lung bases are clear.  No effusions.  Heart is normal size.  Liver, gallbladder, spleen, pancreas and adrenals are unremarkable. Bilateral renal cysts, the largest in the left lower pole measuring up to 7 cm, similar to prior study. No hydronephrosis.  Aorta is heavily calcified, non aneurysmal.  Prior hysterectomy. Urinary bladder is decompressed, grossly unremarkable.  Extensive sigmoid diverticulosis. There is a small fluid collection adjacent to the sigmoid colon measuring 2 cm. It is unclear if this represents a small cyst within the left ovary for possible small fluid collection adjacent to the sigmoid colon related to diverticulitis. This was not present in 2014.  Stomach and small bowel are unremarkable. No free fluid, free air or adenopathy.  No acute bony abnormality or focal bone lesion.  IMPRESSION: Sigmoid diverticulosis. 2 cm fluid collection adjacent to the sigmoid colon in the left pelvis. While this could reflect a small fluid collection/abscess related to diverticulitis, this also may represent a small cystic structure within the left ovary, but is new since 2014. I favor this is a small diverticular abscess. Consider treatment and repeat short-term imaging.  Bilateral renal cysts.   Electronically Signed   By: Rolm Baptise M.D.   On: 12/13/2014 12:34   Dg Chest Port 1 View  12/23/2014   CLINICAL DATA:  Hypoxia  EXAM: PORTABLE CHEST - 1 VIEW  COMPARISON:  12/19/2014  FINDINGS: Bilateral pleural effusions partly  obscures a hemidiaphragms. Lungs are hyperexpanded. No lung consolidation or edema.  Cardiac silhouette is borderline enlarged. No mediastinal or hilar masses.  Right PICC tip projects in the lower superior vena cava.  IMPRESSION: 1. Persistent bilateral pleural effusions. 2. No convincing pneumonia. No pulmonary edema. Stable changes of emphysema. 3. Right PICC is well positioned, new from the prior chest radiograph.   Electronically Signed   By: Lajean Manes M.D.   On: 12/23/2014 08:52   Dg Chest Port 1 View  12/19/2014   CLINICAL DATA:  Cough, shortness of breath.  EXAM: PORTABLE CHEST - 1 VIEW  COMPARISON:  12/18/2014  FINDINGS: Interstitial coarsening. Increased bibasilar opacities. There may be small layering pleural effusions. Aortic atherosclerosis. Enlarged cardiac silhouette. Central vascular congestion. No pneumothorax. Osteopenia.  IMPRESSION: Small pleural effusions and increased bibasilar opacities which may reflect atelectasis, aspiration, or pneumonia.  Background COPD.  Recommend radiograph follow-up in 3-4 weeks to document resolution.   Electronically Signed   By: Carlos Levering M.D.   On: 12/19/2014 07:40    CBC  Recent Labs Lab 12/23/14 0515 12/24/14 0527 12/25/14 0544 12/26/14 0405 12/27/14 0600  WBC 5.8 9.6 13.2* 11.0* 9.1  HGB 10.4* 11.4* 12.8 13.0 13.0  HCT 32.8* 36.2 40.3 40.7 40.7  PLT 252 300 383 387 393  MCV 96.5 95.0 95.7 94.0 93.6  MCH 30.6 29.9 30.4 30.0 29.9  MCHC 31.7 31.5 31.8 31.9 31.9  RDW 13.4 13.4 14.2 14.2 14.3  LYMPHSABS 1.6 0.6* 2.9 2.4 1.8  MONOABS 0.8 0.4 1.5* 1.5* 0.9  EOSABS 0.5 0.0 0.2 0.1 0.1  BASOSABS 0.0 0.0 0.0 0.0 0.0  Chemistries   Recent Labs Lab 12/21/14 0440  12/22/14 0406 12/22/14 7494 12/23/14 0515 12/24/14 0527 12/25/14 0544 12/26/14 0405 12/27/14 0600  NA 141  < >  --  140 141 139 141 141 137  K 3.0*  < >  --  4.0 3.3* 3.4* 3.5 3.0* 3.7  CL 110  < >  --  100 102 94* 92* 91* 93*  CO2 27  < >  --  27 32 38* 39*  38* 34*  GLUCOSE 111*  < >  --  57* 111* 189* 81 96 111*  BUN <5*  < >  --  5* <5* 8 15 24* 43*  CREATININE 0.58  < >  --  1.00 0.75 0.75 0.90 0.94 1.24*  CALCIUM 6.5*  < >  --  8.4 8.1* 8.4 8.5 8.8 8.6  MG 1.9  --  1.9 1.9 1.7 2.4  --   --   --   AST 19  --   --  21 18 20  53* 32 22  ALT 7  --   --  9 8 9 14 15 11   ALKPHOS 43  --   --  56 53 63 60 62 61  BILITOT 2.3*  --   --  1.0 0.3 0.3 0.5 0.4 0.6  < > = values in this interval not displayed. ------------------------------------------------------------------------------------------------------------------ estimated creatinine clearance is 32.2 mL/min (by C-G formula based on Cr of 1.24). ------------------------------------------------------------------------------------------------------------------ No results for input(s): HGBA1C in the last 72 hours. ------------------------------------------------------------------------------------------------------------------ No results for input(s): CHOL, HDL, LDLCALC, TRIG, CHOLHDL, LDLDIRECT in the last 72 hours. ------------------------------------------------------------------------------------------------------------------ No results for input(s): TSH, T4TOTAL, T3FREE, THYROIDAB in the last 72 hours.  Invalid input(s): FREET3 ------------------------------------------------------------------------------------------------------------------ No results for input(s): VITAMINB12, FOLATE, FERRITIN, TIBC, IRON, RETICCTPCT in the last 72 hours.  Coagulation profile  Recent Labs Lab 12/21/14 1120  INR 1.21    No results for input(s): DDIMER in the last 72 hours.  Cardiac Enzymes  Recent Labs Lab 12/20/14 1835 12/21/14 0440 12/25/14 0940  TROPONINI 0.78* 0.49* 0.13*   ------------------------------------------------------------------------------------------------------------------ Invalid input(s): POCBNP  No results for input(s): GLUCAP in the last 72 hours.   [facility]  M.D. [facility] 12/27/2014, 5:17 PM  Pager: [phone]  Between 7am to 7pm - call Pager - [phone]  After 7pm go to [email or web address] - password [email or web address]  Call night coverage person covering after 7pm

## 2014-12-27 NOTE — Progress Notes (Signed)
Pt ambulated 300 feet with rolling walker and oxygen without any breaks, dyspnea or complaints of pain.

## 2014-12-27 NOTE — Progress Notes (Signed)
Patient BP 88/53, HR 82, patient with no complaints of CP or dizziness at this time, resting comfortably in bed, Dr. Baltazar Najjar paged, awaiting new orders at this time, will continue to monitor closely.

## 2014-12-28 ENCOUNTER — Encounter: Payer: Self-pay | Admitting: Vascular Surgery

## 2014-12-28 LAB — CBC WITH DIFFERENTIAL/PLATELET
Basophils Absolute: 0 10*3/uL (ref 0.0–0.1)
Basophils Relative: 0 % (ref 0–1)
Eosinophils Absolute: 0.1 10*3/uL (ref 0.0–0.7)
Eosinophils Relative: 1 % (ref 0–5)
HCT: 40.2 % (ref 36.0–46.0)
Hemoglobin: 13.1 g/dL (ref 12.0–15.0)
Lymphocytes Relative: 26 % (ref 12–46)
Lymphs Abs: 2.8 10*3/uL (ref 0.7–4.0)
MCH: 30.6 pg (ref 26.0–34.0)
MCHC: 32.6 g/dL (ref 30.0–36.0)
MCV: 93.9 fL (ref 78.0–100.0)
Monocytes Absolute: 1.4 10*3/uL — ABNORMAL HIGH (ref 0.1–1.0)
Monocytes Relative: 12 % (ref 3–12)
Neutro Abs: 6.7 10*3/uL (ref 1.7–7.7)
Neutrophils Relative %: 61 % (ref 43–77)
Platelets: 411 10*3/uL — ABNORMAL HIGH (ref 150–400)
RBC: 4.28 MIL/uL (ref 3.87–5.11)
RDW: 14.3 % (ref 11.5–15.5)
WBC: 11 10*3/uL — ABNORMAL HIGH (ref 4.0–10.5)

## 2014-12-28 MED ORDER — ATORVASTATIN CALCIUM 80 MG PO TABS: 80.0000 mg | ORAL_TABLET | Freq: Every day | ORAL | Status: DC

## 2014-12-28 MED ORDER — ASPIRIN 81 MG PO CHEW: 81.0000 mg | CHEWABLE_TABLET | Freq: Every day | ORAL | Status: AC

## 2014-12-28 MED ORDER — POTASSIUM CHLORIDE ER 10 MEQ PO TBCR: 20.0000 meq | EXTENDED_RELEASE_TABLET | Freq: Every day | ORAL | Status: DC

## 2014-12-28 MED ORDER — BUDESONIDE-FORMOTEROL FUMARATE 80-4.5 MCG/ACT IN AERO: 2.0000 | INHALATION_SPRAY | Freq: Two times a day (BID) | RESPIRATORY_TRACT | Status: AC

## 2014-12-28 MED ORDER — PREDNISONE 5 MG PO TABS: ORAL_TABLET | ORAL | Status: DC

## 2014-12-28 MED ORDER — METRONIDAZOLE 500 MG PO TABS: 500.0000 mg | ORAL_TABLET | Freq: Three times a day (TID) | ORAL | Status: DC

## 2014-12-28 MED ORDER — FUROSEMIDE 40 MG PO TABS: 40.0000 mg | ORAL_TABLET | Freq: Every day | ORAL | Status: DC

## 2014-12-28 NOTE — Progress Notes (Signed)
SATURATION QUALIFICATIONS: (This note is used to comply with regulatory documentation for home oxygen)  Patient Saturations on Room Air at Rest = 87%  Patient Saturations on Room Air while Ambulating = N/A due to 87% on RA at rest  Patient Saturations on 2 Liters of oxygen while Ambulating = 88%  Patient Saturations on 3 Liters of oxygen while Ambulating = 90%   Please briefly explain why patient needs home oxygen:   12/28/2014 Barry Brunner, PT Pager: [phone]

## 2014-12-28 NOTE — Progress Notes (Signed)
Physical Therapy Treatment Patient Details Name: Lindsay Mitchell MRN: [id number] DOB: 1940/12/07 Today's Date: 12/28/2014    History of Present Illness Adm 12/15/14 with abd pain and N/V; +diverticulosis with colonic abscess; treated conservatively/non-surgical; developed hypotension and severe CP; Cardiology consult with MI and PE ruled out; atypical angina PMHx- COPD, CVA, back surgeries x 3, DVT,     PT Comments    Pt very motivated to go home. Remains slightly impulsive. Very limited endurance, reserves (had to return to supine from EOB due to not feeling well while waiting for SaO2 to incr enough to walk--BP assessed and NOT orthostatic). See separate O2 qualification note.    Follow Up Recommendations  Home health PT;Supervision/Assistance - 24 hour     Equipment Recommendations  None recommended by PT    Recommendations for Other Services OT consult     Precautions / Restrictions Precautions Precautions: Fall Precaution Comments: decr SaO2 Restrictions Weight Bearing Restrictions: No    Mobility  Bed Mobility Overal bed mobility: Needs Assistance Bed Mobility: Supine to Sit;Sit to Supine     Supine to sit: Supervision;Min assist Sit to supine: Supervision   General bed mobility comments: supervision on 1st supine to sit (with rail); returned to supine abruptly when not feeling well; required min assist to come to sit 2 nd time  Transfers Overall transfer level: Needs assistance Equipment used: Rolling walker (2 wheeled) Transfers: Sit to/from Stand Sit to Stand: Min guard (cues for hand placement)         General transfer comment: no posterior lean/LOB  Ambulation/Gait Ambulation/Gait assistance: Min guard Ambulation Distance (Feet): 200 Feet Assistive device: Rolling walker (2 wheeled) Gait Pattern/deviations: WFL(Within Functional Limits)     General Gait Details: Difficulty getting a SaO2 reading while walking and pt did not feel well if she stood  still   Science writer    Modified Rankin (Stroke Patients Only)       Balance     Sitting balance-Leahy Scale: Good       Standing balance-Leahy Scale: Poor                      Cognition Arousal/Alertness: Awake/alert Behavior During Therapy: Flat affect;Impulsive Overall Cognitive Status: Within Functional Limits for tasks assessed                      Exercises      General Comments General comments (skin integrity, edema, etc.): husband present; educated on use of home O2 (and NOT to smoke, risks of burns). Discussed use of concentrator and longer tubing (with incr risk of fall) and smaller portable tank when going out      Pertinent Vitals/Pain Pain Assessment: No/denies pain    Home Living                      Prior Function            PT Goals (current goals can now be found in the care plan section) Acute Rehab PT Goals Patient Stated Goal: be able to go home ASAP PT Goal Formulation: With patient Time For Goal Achievement: 01/08/15 Potential to Achieve Goals: Good Progress towards PT goals: Progressing toward goals    Frequency  Min 3X/week    PT Plan Current plan remains appropriate    Co-evaluation  End of Session Equipment Utilized During Treatment: Gait belt;Oxygen Activity Tolerance: Patient limited by fatigue Patient left: in chair;with call bell/phone within reach;with family/visitor present     Time: 3200-3794 PT Time Calculation (min) (ACUTE ONLY): 28 min  Charges:  $Gait Training: 8-22 mins $Therapeutic Activity: 8-22 mins                    G Codes:      Finneas Mathe 01/24/15, 10:26 AM Pager [phone]

## 2014-12-28 NOTE — Progress Notes (Signed)
Pt discharged home with husband Discharge instructions given & reviewed Education discussed  PICC dc'd per IV team Tele dc'd  Pt discharged via wheelchair with NT Home O2 sent with patient All patient belongs at side.   Sherrie Mustache

## 2014-12-28 NOTE — Discharge Summary (Signed)
Physician Discharge Summary  Lindsay Mitchell MRN: [id number] DOB/AGE: 10-06-1940 74 y.o.  PCP: Glo Herring., MD   Admit date: 12/15/2014 Discharge date: 12/28/2014  Discharge Diagnoses:     Principal Problem:   Colonic diverticular abscess Active Problems:   COPD (chronic obstructive pulmonary disease)   Depression with anxiety   Pulmonary nodules   Tobacco abuse   Protein-calorie malnutrition, severe   Chest pain   Pleural effusion   Hypotension   Anemia   Elevated troponin   Hypoxia   Diverticulitis of large intestine with abscess without bleeding   Arterial hypotension   Shock circulatory   NSTEMI (non-ST elevated myocardial infarction)   Hypomagnesemia   COPD, severe   COPD exacerbation   Essential hypertension   Other chest pain   Acute renal failure syndrome   Diverticulitis of colon   C. difficile colitis   Severe protein-calorie malnutrition   Chronic pain syndrome    Follow-up recommendations Follow-up with PCP in 3-5 days Follow-up CBC, CMP in 3-5 days PCP to arrange for home oxygen requirements and repeat chest x-ray to assess volume status     Medication List    STOP taking these medications        aspirin EC 325 MG tablet  Replaced by:  aspirin 81 MG chewable tablet     ciprofloxacin 500 MG tablet  Commonly known as:  CIPRO     LINZESS 145 MCG Caps capsule  Generic drug:  Linaclotide     methylPREDNISolone 4 MG tablet  Commonly known as:  MEDROL DOSEPAK      TAKE these medications        albuterol 108 (90 BASE) MCG/ACT inhaler  Commonly known as:  PROVENTIL HFA;VENTOLIN HFA  Inhale 2 puffs into the lungs every 4 (four) hours as needed for wheezing or shortness of breath.     aspirin 81 MG chewable tablet  Chew 1 tablet (81 mg total) by mouth daily.     atorvastatin 80 MG tablet  Commonly known as:  LIPITOR  Take 1 tablet (80 mg total) by mouth daily at 6 PM.     budesonide-formoterol 80-4.5 MCG/ACT inhaler   Commonly known as:  SYMBICORT  Inhale 2 puffs into the lungs 2 (two) times daily.     furosemide 40 MG tablet  Commonly known as:  LASIX  Take 1 tablet (40 mg total) by mouth daily.     gabapentin 300 MG capsule  Commonly known as:  NEURONTIN  Take 3 capsules (900 mg total) by mouth 3 (three) times daily.     HYDROcodone-acetaminophen 10-325 MG per tablet  Commonly known as:  NORCO  Take 1 tablet by mouth every 6 (six) hours as needed for moderate pain or severe pain.     ipratropium-albuterol 0.5-2.5 (3) MG/3ML Soln  Commonly known as:  DUONEB  Take 3 mLs by nebulization 3 (three) times daily.     metroNIDAZOLE 500 MG tablet  Commonly known as:  FLAGYL  Take 1 tablet (500 mg total) by mouth 3 (three) times daily. 10 day course starting on 12/13/2014     Oxcarbazepine 300 MG tablet  Commonly known as:  TRILEPTAL  Take 1 tablet (300 mg total) by mouth 2 (two) times daily.     potassium chloride 10 MEQ tablet  Commonly known as:  K-DUR  Take 2 tablets (20 mEq total) by mouth daily.     predniSONE 5 MG tablet  Commonly known as:  DELTASONE  -  8 tablets for 3 days  - 6 tablets for 3 days  - 5 tablets for 3 days  - 4 tablets for 3 days  - 3 tablets for 3 days  - 2 tablets for 3 days  - 1 tablet for 3 days then discontinue        Discharge Condition: Fragile but stable   Disposition: 01-Home or Self Care   Consults:    Significant Diagnostic Studies: Dg Chest 2 View  12/26/2014   CLINICAL DATA:  Hypoxia  EXAM: CHEST  2 VIEW  COMPARISON:  Portable view of the chest 12/23/2014 and CT chest 12/19/2014  FINDINGS: Right upper extremity PICC terminates in the mid superior vena cava. Cardiac leads project over the chest. Heart size is mildly enlarged and appears stable. Pulmonary vascularity is normal. There is bibasilar atelectasis and there are small bilateral pleural effusions. Aeration of the lung bases appear slightly improved compared to the chest radiograph  dated 12/23/2014. No acute bony abnormality.  IMPRESSION: Bibasilar atelectasis and small bilateral pleural effusions. Improving aeration of the lung bases compared to recent prior.   Electronically Signed   By: Curlene Dolphin M.D.   On: 12/26/2014 14:36   Dg Chest 2 View  12/18/2014   CLINICAL DATA:  Left-sided rib pain and cough.  Shortness of breath.  EXAM: CHEST  2 VIEW  COMPARISON:  12/13/2014 abdominal CT, 04/28/2014 radiograph  FINDINGS: Small left pleural effusion and associated airspace opacity. Background interstitial coarsening and hyperinflation. Aortic atherosclerosis. Normal heart size. Osteopenia and multilevel degenerative changes.  IMPRESSION: Small left pleural effusion and associated airspace opacity; atelectasis versus pneumonia. Recommend a repeat radiograph in 3-4 weeks to document resolution.  Background COPD.   Electronically Signed   By: Carlos Levering M.D.   On: 12/18/2014 06:53   Ct Angio Chest Pe W/cm &/or Wo Cm  12/19/2014   CLINICAL DATA:  Shortness of breath. Recent surgery for diverticulitis. Chest pain started last night.  EXAM: CT ANGIOGRAPHY CHEST WITH CONTRAST  TECHNIQUE: Multidetector CT imaging of the chest was performed using the standard protocol during bolus administration of intravenous contrast. Multiplanar CT image reconstructions and MIPs were obtained to evaluate the vascular anatomy.  CONTRAST:  49m OMNIPAQUE IOHEXOL 350 MG/ML SOLN  COMPARISON:  None.  FINDINGS: There is adequate opacification of the pulmonary arteries. There is no pulmonary embolus. The main pulmonary artery, right main pulmonary artery and left main pulmonary arteries are normal in size. The heart size is mildly enlarged. There is no pericardial effusion. There is coronary artery atherosclerosis in the LAD, circumflex and RCA.  There are bilateral small pleural effusions. There is bilateral compressive atelectasis, left greater than right. There is bilateral centrilobular emphysema. There is  no pneumothorax.  There is no axillary, hilar, or mediastinal adenopathy.  There is no lytic or blastic osseous lesion.  The visualized portions of the upper abdomen are unremarkable.  Review of the MIP images confirms the above findings.  IMPRESSION: 1. No evidence pulmonary embolus. 2. Bilateral small pleural effusions and compressive atelectasis. 3. Bilateral centrilobular emphysema.   Electronically Signed   By: HKathreen Devoid  On: 12/19/2014 16:17   Ct Abdomen Pelvis W Contrast  12/19/2014   CLINICAL DATA:  Follow-up diverticular abscess.  Hypotension.  EXAM: CT ABDOMEN AND PELVIS WITH CONTRAST  TECHNIQUE: Multidetector CT imaging of the abdomen and pelvis was performed using the standard protocol following bolus administration of intravenous contrast.  CONTRAST:  1019mOMNIPAQUE IOHEXOL 300 MG/ML  SOLN  COMPARISON:  CT abdomen pelvis 12/13/2011  FINDINGS: Interval resolution of rim enhancing fluid collection posterior to the sigmoid colon compatible with resolution of abscess. Sigmoid diverticular change noted in this area with some thickening of the bowel which also has improved. Findings are consistent with resolving diverticulitis and abscess. No abscess or free fluid on today's study. Negative for bowel obstruction.  Small bilateral pleural effusions with bibasilar atelectasis have developed since the prior study  Focal fatty infiltration of the left lobe of the liver adjacent to the falciform ligament unchanged. Pericholecystic fluid has developed since the prior study however the gallbladder wall is not thickened and no gallstones identified. Correlate with right upper quadrant pain and ultrasound if clinically indicated. Spleen is normal. Pancreas is atrophic without edema or mass  Negative for renal obstruction or mass. Multiple renal cysts. Largest cyst left lower pole measuring 7 cm  Atherosclerotic aorta without aneurysm. Prior hysterectomy. No adenopathy.  Moderate disc degeneration and spurring  in the lower lumbar spine. Negative for fracture  IMPRESSION: Improvement in diverticulitis. Resolution of diverticular abscess in the sigmoid region.  Interval development of pericholecystic fluid. No gallbladder thickening or gallstones. Correlate with pain in this area and possible ultrasound if indicated  Interval development of bilateral pleural effusions and bibasilar atelectasis.   Electronically Signed   By: Franchot Gallo M.D.   On: 12/19/2014 11:28   Ct Abdomen Pelvis W Contrast  12/13/2014   CLINICAL DATA:  Abdominal pain. Evaluate for diverticulitis or perforated bowel.  EXAM: CT ABDOMEN AND PELVIS WITH CONTRAST  TECHNIQUE: Multidetector CT imaging of the abdomen and pelvis was performed using the standard protocol following bolus administration of intravenous contrast.  CONTRAST:  174m OMNIPAQUE IOHEXOL 300 MG/ML  SOLN  COMPARISON:  05/23/2013  FINDINGS: Lung bases are clear.  No effusions.  Heart is normal size.  Liver, gallbladder, spleen, pancreas and adrenals are unremarkable. Bilateral renal cysts, the largest in the left lower pole measuring up to 7 cm, similar to prior study. No hydronephrosis.  Aorta is heavily calcified, non aneurysmal.  Prior hysterectomy. Urinary bladder is decompressed, grossly unremarkable.  Extensive sigmoid diverticulosis. There is a small fluid collection adjacent to the sigmoid colon measuring 2 cm. It is unclear if this represents a small cyst within the left ovary for possible small fluid collection adjacent to the sigmoid colon related to diverticulitis. This was not present in 2014.  Stomach and small bowel are unremarkable. No free fluid, free air or adenopathy.  No acute bony abnormality or focal bone lesion.  IMPRESSION: Sigmoid diverticulosis. 2 cm fluid collection adjacent to the sigmoid colon in the left pelvis. While this could reflect a small fluid collection/abscess related to diverticulitis, this also may represent a small cystic structure within the  left ovary, but is new since 2014. I favor this is a small diverticular abscess. Consider treatment and repeat short-term imaging.  Bilateral renal cysts.   Electronically Signed   By: KRolm BaptiseM.D.   On: 12/13/2014 12:34   Dg Chest Port 1 View  12/23/2014   CLINICAL DATA:  Hypoxia  EXAM: PORTABLE CHEST - 1 VIEW  COMPARISON:  12/19/2014  FINDINGS: Bilateral pleural effusions partly obscures a hemidiaphragms. Lungs are hyperexpanded. No lung consolidation or edema.  Cardiac silhouette is borderline enlarged. No mediastinal or hilar masses.  Right PICC tip projects in the lower superior vena cava.  IMPRESSION: 1. Persistent bilateral pleural effusions. 2. No convincing pneumonia. No pulmonary edema. Stable changes of emphysema.  3. Right PICC is well positioned, new from the prior chest radiograph.   Electronically Signed   By: David  Ormond M.D.   On: 12/23/2014 08:52   Dg Chest Port 1 View  12/19/2014   CLINICAL DATA:  Cough, shortness of breath.  EXAM: PORTABLE CHEST - 1 VIEW  COMPARISON:  12/18/2014  FINDINGS: Interstitial coarsening. Increased bibasilar opacities. There may be small layering pleural effusions. Aortic atherosclerosis. Enlarged cardiac silhouette. Central vascular congestion. No pneumothorax. Osteopenia.  IMPRESSION: Small pleural effusions and increased bibasilar opacities which may reflect atelectasis, aspiration, or pneumonia.  Background COPD.  Recommend radiograph follow-up in 3-4 weeks to document resolution.   Electronically Signed   By: Andrew  DelGaizo M.D.   On: 12/19/2014 07:40      Microbiology: Recent Results (from the past 240 hour(s))  MRSA PCR Screening     Status: None   Collection Time: 12/19/14  5:35 AM  Result Value Ref Range Status   MRSA by PCR NEGATIVE NEGATIVE Final    Comment:        The GeneXpert MRSA Assay (FDA approved for NASAL specimens only), is one component of a comprehensive MRSA colonization surveillance program. It is not intended to  diagnose MRSA infection nor to guide or monitor treatment for MRSA infections.   Culture, blood (x 2)     Status: None   Collection Time: 12/19/14  7:35 AM  Result Value Ref Range Status   Specimen Description BLOOD RIGHT WRIST  Final   Special Requests BOTTLES DRAWN AEROBIC AND ANAEROBIC 8CC  Final   Culture NO GROWTH 5 DAYS  Final   Report Status 12/24/2014 FINAL  Final  Culture, blood (x 2)     Status: None   Collection Time: 12/19/14  7:40 AM  Result Value Ref Range Status   Specimen Description BLOOD LEFT HAND  Final   Special Requests   Final    BOTTLES DRAWN AEROBIC AND ANAEROBIC AEB=8CC ANA=6CC   Culture NO GROWTH 5 DAYS  Final   Report Status 12/24/2014 FINAL  Final  Clostridium Difficile by PCR     Status: Abnormal   Collection Time: 12/21/14 12:53 PM  Result Value Ref Range Status   C difficile by pcr POSITIVE (A) NEGATIVE Final    Comment: CRITICAL RESULT CALLED TO, READ BACK BY AND VERIFIED WITH: T. OLERY RN 16:00 12/21/14 ([email or web address])      Labs: Results for orders placed or performed during the hospital encounter of 12/15/14 (from the past 48 hour(s))  Comprehensive metabolic panel     Status: Abnormal   Collection Time: 12/27/14  6:00 AM  Result Value Ref Range   Sodium 137 135 - 145 mmol/L   Potassium 3.7 3.5 - 5.1 mmol/L   Chloride 93 (L) 96 - 112 mmol/L   CO2 34 (H) 19 - 32 mmol/L   Glucose, Bld 111 (H) 70 - 99 mg/dL   BUN 43 (H) 6 - 23 mg/dL   Creatinine, Ser 1.24 (H) 0.50 - 1.10 mg/dL   Calcium 8.6 8.4 - 10.5 mg/dL   Total Protein 5.2 (L) 6.0 - 8.3 g/dL   Albumin 2.8 (L) 3.5 - 5.2 g/dL   AST 22 0 - 37 U/L   ALT 11 0 - 35 U/L   Alkaline Phosphatase 61 39 - 117 U/L   Total Bilirubin 0.6 0.3 - 1.2 mg/dL   GFR calc non Af Amer 43 (L) >90 mL/min   GFR calc Af Amer 49 (L) >90 mL/min      Comment: (NOTE) The eGFR has been calculated using the CKD EPI equation. This calculation has not been validated in all clinical situations. eGFR's persistently <90 mL/min  signify possible Chronic Kidney Disease.    Anion gap 10 5 - 15  CBC with Differential/Platelet     Status: None   Collection Time: 12/27/14  6:00 AM  Result Value Ref Range   WBC 9.1 4.0 - 10.5 K/uL   RBC 4.35 3.87 - 5.11 MIL/uL   Hemoglobin 13.0 12.0 - 15.0 g/dL   HCT 40.7 36.0 - 46.0 %   MCV 93.6 78.0 - 100.0 fL   MCH 29.9 26.0 - 34.0 pg   MCHC 31.9 30.0 - 36.0 g/dL   RDW 14.3 11.5 - 15.5 %   Platelets 393 150 - 400 K/uL   Neutrophils Relative % 69 43 - 77 %   Neutro Abs 6.3 1.7 - 7.7 K/uL   Lymphocytes Relative 20 12 - 46 %   Lymphs Abs 1.8 0.7 - 4.0 K/uL   Monocytes Relative 10 3 - 12 %   Monocytes Absolute 0.9 0.1 - 1.0 K/uL   Eosinophils Relative 1 0 - 5 %   Eosinophils Absolute 0.1 0.0 - 0.7 K/uL   Basophils Relative 0 0 - 1 %   Basophils Absolute 0.0 0.0 - 0.1 K/uL  CBC with Differential/Platelet     Status: Abnormal   Collection Time: 12/28/14  4:40 AM  Result Value Ref Range   WBC 11.0 (H) 4.0 - 10.5 K/uL   RBC 4.28 3.87 - 5.11 MIL/uL   Hemoglobin 13.1 12.0 - 15.0 g/dL   HCT 40.2 36.0 - 46.0 %   MCV 93.9 78.0 - 100.0 fL   MCH 30.6 26.0 - 34.0 pg   MCHC 32.6 30.0 - 36.0 g/dL   RDW 14.3 11.5 - 15.5 %   Platelets 411 (H) 150 - 400 K/uL   Neutrophils Relative % 61 43 - 77 %   Neutro Abs 6.7 1.7 - 7.7 K/uL   Lymphocytes Relative 26 12 - 46 %   Lymphs Abs 2.8 0.7 - 4.0 K/uL   Monocytes Relative 12 3 - 12 %   Monocytes Absolute 1.4 (H) 0.1 - 1.0 K/uL   Eosinophils Relative 1 0 - 5 %   Eosinophils Absolute 0.1 0.0 - 0.7 K/uL   Basophils Relative 0 0 - 1 %   Basophils Absolute 0.0 0.0 - 0.1 K/uL     HPI  73-year-old female, with severe COPD, smoker, diverticulosis, fibromyalgia, DVT initially admitted on 4/15 to [facility] with diverticulitis and contained abdominal abscess. She was treated with bowel rest and IV antibiotics, followed by surgery however no surgical intervention was needed. On 4/19 patient developed shortness of breath, chest pain and  hypotension. CT angiogram of the chest was negative for pulmonary embolism, troponin was elevated at 1.9.  Repeat CT abdomen on 4/19 had shown improvement in diverticulitis with resolution of diverticular abscess in the sigmoid region. Patient was seen by general surgery and cardiology for consultation.  The patient was transferred to [facility] and was admitted by PCCM due to hypotension and pressors needs.  Patient underwent cardiac cath on 4/21 which showed moderate to heavy three-vessel coronary Patient but no significant obstructive coronary disease, eccentric 40% proximal RCA overall normal LV function with EF of 55%, no regional wall motion abnormalities. Patient was transferred to telemetry floor on 4/22, 4/23: resp distress, improved with lasix and nebs  4/24 on MC 8: resp improving,   still with dyspnea   HOSPITAL COURSE:   Acute hypoxic respiratory failure with severe underlying COPD and, likely volume overload - due to fluid overload and COPD Patient required diuresis with IV lasix 40mg q12 today, last chest x-ray 4/26 showed atelectasis with bilateral pleural effusions Started on prednisone with taper outpatient Continue DuoNeb's q 4 hours, Symbicort - Outpatient sleep study or CPAP at night and outpatient pulmonology follow-up may be needed if the patient continues to have  high oxygen requirements   Acute diverticulitis with Colonic diverticular abscess - Repeat CT scan 4/19 had shown improving diverticulitis with resolution of abscess,  - completed Zosyn course. - Continue solid diet as tolerated - Fu with CCS  C. difficile colitis/diarrhea - continue oral Flagyl, diarrhea has improved, Stopped Mira lax ,linzess and senna Continue Flagyl for another 2 weeks   Constipation -resolved Discontinue miralax and senokot, these may be reintroduced slowly the patient starts developing constipation   Nicotine abuse - Counseled the patient on nicotine  cessation  Hypertension Discontinued lisinopril due to soft blood pressure  Mild acute renal supple insufficiency at the time of admission on 4/19, creatinine was 1.12- resolved - resolved  Severe protein calorie malnutrition - Nutrition consult placed  Cad/ chest pain with elevated troponin/nstemi - CT angiogram of the chest was negative for pulmonary embolism 2D echo 4/19>>> EF 60-65%, mod dilated RV, PA peak pressure 40mmHg,  4/21 left heart catheterization;-Moderate to heavy three-vessel coronary calcification -LVEF= 55%  Patient but no significant obstructive coronary disease, eccentric 40% proximal RCA overall normal LV function with EF of 55%, no regional wall motion abnormalities. - nitro PRN  Hypokalemia, hypomagnesemia - Replaced , patient provided with potassium supplementation  Pulmonary nodules  - known since 2014, FU CT   Code Status: Full CODE STATUS    Discharge Exam:   Blood pressure 142/62, pulse 93, temperature 97.9 F (36.6 C), temperature source Oral, resp. rate 19, height 5' 3" (1.6 m), weight 49.4 kg (108 lb 14.5 oz), SpO2 93 %.   General: AAOx3, mild distress after walking  HEENT: PERRLA, EOMI, Anicteic Sclera, mucous membranes moist.   Neck: Supple, no JVD, no masses  CVS: S1 S2 auscultated, no rubs, murmurs or gallops. Regular rate and rhythm.  Respiratory:basilar crackles  Abdomen: Soft, NT, ND, nondistended, + bowel sounds  Ext: no cyanosis clubbing or edema  Neuro: Cr N's II- XII. Strength 5/5 upper and lower extremities bilaterally  Skin: No rashes       Discharge Instructions    Diet - low sodium heart healthy    Complete by:  As directed      Increase activity slowly    Complete by:  As directed            Follow-up Information    Follow up with [facility].   Why:  Home Use Oxygen   Contact information:   [address] [place] [address] [phone]       Follow up with [facility].   Why:  Registered Nurse, Physical Therapy, Nursing Aide,Occupational Therapy   Contact information:   [address] [place] [address] [phone]       Follow up with FUSCO,LAWRENCE J., MD. Schedule an appointment as soon as possible for a visit in 1 week.   Specialty:  Internal Medicine   Why:  Assess home oxygen requirements, repeat chest x-ray   Contact information:   [address] [place]   [address] [phone]       Signed: Reyne Dumas 12/28/2014, 11:45 AM

## 2014-12-29 ENCOUNTER — Ambulatory Visit: Payer: Medicare Other | Admitting: Vascular Surgery

## 2014-12-29 DIAGNOSIS — I10 Essential (primary) hypertension: Secondary | ICD-10-CM | POA: Diagnosis not present

## 2014-12-29 DIAGNOSIS — F418 Other specified anxiety disorders: Secondary | ICD-10-CM | POA: Diagnosis not present

## 2014-12-29 DIAGNOSIS — D649 Anemia, unspecified: Secondary | ICD-10-CM | POA: Diagnosis not present

## 2014-12-29 DIAGNOSIS — E43 Unspecified severe protein-calorie malnutrition: Secondary | ICD-10-CM | POA: Diagnosis not present

## 2014-12-29 DIAGNOSIS — F17200 Nicotine dependence, unspecified, uncomplicated: Secondary | ICD-10-CM | POA: Diagnosis not present

## 2014-12-29 DIAGNOSIS — Z8673 Personal history of transient ischemic attack (TIA), and cerebral infarction without residual deficits: Secondary | ICD-10-CM | POA: Diagnosis not present

## 2014-12-29 DIAGNOSIS — J441 Chronic obstructive pulmonary disease with (acute) exacerbation: Secondary | ICD-10-CM | POA: Diagnosis not present

## 2014-12-29 DIAGNOSIS — K5780 Diverticulitis of intestine, part unspecified, with perforation and abscess without bleeding: Secondary | ICD-10-CM | POA: Diagnosis not present

## 2015-01-01 DIAGNOSIS — F418 Other specified anxiety disorders: Secondary | ICD-10-CM | POA: Diagnosis not present

## 2015-01-01 DIAGNOSIS — K5780 Diverticulitis of intestine, part unspecified, with perforation and abscess without bleeding: Secondary | ICD-10-CM | POA: Diagnosis not present

## 2015-01-01 DIAGNOSIS — Z8673 Personal history of transient ischemic attack (TIA), and cerebral infarction without residual deficits: Secondary | ICD-10-CM | POA: Diagnosis not present

## 2015-01-01 DIAGNOSIS — D649 Anemia, unspecified: Secondary | ICD-10-CM | POA: Diagnosis not present

## 2015-01-01 DIAGNOSIS — E43 Unspecified severe protein-calorie malnutrition: Secondary | ICD-10-CM | POA: Diagnosis not present

## 2015-01-01 DIAGNOSIS — F17200 Nicotine dependence, unspecified, uncomplicated: Secondary | ICD-10-CM | POA: Diagnosis not present

## 2015-01-01 DIAGNOSIS — I10 Essential (primary) hypertension: Secondary | ICD-10-CM | POA: Diagnosis not present

## 2015-01-01 DIAGNOSIS — J441 Chronic obstructive pulmonary disease with (acute) exacerbation: Secondary | ICD-10-CM | POA: Diagnosis not present

## 2015-01-03 DIAGNOSIS — J449 Chronic obstructive pulmonary disease, unspecified: Secondary | ICD-10-CM | POA: Diagnosis not present

## 2015-01-03 DIAGNOSIS — K5792 Diverticulitis of intestine, part unspecified, without perforation or abscess without bleeding: Secondary | ICD-10-CM | POA: Diagnosis not present

## 2015-01-03 DIAGNOSIS — G894 Chronic pain syndrome: Secondary | ICD-10-CM | POA: Diagnosis not present

## 2015-01-03 DIAGNOSIS — Z681 Body mass index (BMI) 19 or less, adult: Secondary | ICD-10-CM | POA: Diagnosis not present

## 2015-01-04 DIAGNOSIS — R197 Diarrhea, unspecified: Secondary | ICD-10-CM | POA: Diagnosis not present

## 2015-01-05 DIAGNOSIS — R197 Diarrhea, unspecified: Secondary | ICD-10-CM | POA: Diagnosis not present

## 2015-01-08 DIAGNOSIS — D649 Anemia, unspecified: Secondary | ICD-10-CM | POA: Diagnosis not present

## 2015-01-08 DIAGNOSIS — Z8673 Personal history of transient ischemic attack (TIA), and cerebral infarction without residual deficits: Secondary | ICD-10-CM | POA: Diagnosis not present

## 2015-01-08 DIAGNOSIS — I10 Essential (primary) hypertension: Secondary | ICD-10-CM | POA: Diagnosis not present

## 2015-01-08 DIAGNOSIS — K5780 Diverticulitis of intestine, part unspecified, with perforation and abscess without bleeding: Secondary | ICD-10-CM | POA: Diagnosis not present

## 2015-01-08 DIAGNOSIS — F418 Other specified anxiety disorders: Secondary | ICD-10-CM | POA: Diagnosis not present

## 2015-01-08 DIAGNOSIS — E43 Unspecified severe protein-calorie malnutrition: Secondary | ICD-10-CM | POA: Diagnosis not present

## 2015-01-08 DIAGNOSIS — F17200 Nicotine dependence, unspecified, uncomplicated: Secondary | ICD-10-CM | POA: Diagnosis not present

## 2015-01-08 DIAGNOSIS — J441 Chronic obstructive pulmonary disease with (acute) exacerbation: Secondary | ICD-10-CM | POA: Diagnosis not present

## 2015-01-10 ENCOUNTER — Ambulatory Visit (INDEPENDENT_AMBULATORY_CARE_PROVIDER_SITE_OTHER): Payer: Medicare Other | Admitting: Physician Assistant

## 2015-01-10 ENCOUNTER — Encounter: Payer: Self-pay | Admitting: Physician Assistant

## 2015-01-10 VITALS — BP 110/72 | HR 93 | Ht 63.0 in | Wt 108.0 lb

## 2015-01-10 DIAGNOSIS — I214 Non-ST elevation (NSTEMI) myocardial infarction: Secondary | ICD-10-CM | POA: Diagnosis not present

## 2015-01-10 DIAGNOSIS — I10 Essential (primary) hypertension: Secondary | ICD-10-CM | POA: Diagnosis not present

## 2015-01-10 DIAGNOSIS — Z79899 Other long term (current) drug therapy: Secondary | ICD-10-CM | POA: Diagnosis not present

## 2015-01-10 DIAGNOSIS — I5032 Chronic diastolic (congestive) heart failure: Secondary | ICD-10-CM | POA: Diagnosis not present

## 2015-01-10 MED ORDER — ATORVASTATIN CALCIUM 80 MG PO TABS: 80.0000 mg | ORAL_TABLET | Freq: Every day | ORAL | Status: AC

## 2015-01-10 MED ORDER — POTASSIUM CHLORIDE ER 10 MEQ PO TBCR: 20.0000 meq | EXTENDED_RELEASE_TABLET | Freq: Every day | ORAL | Status: DC

## 2015-01-10 MED ORDER — FUROSEMIDE 40 MG PO TABS: 40.0000 mg | ORAL_TABLET | Freq: Every day | ORAL | Status: DC

## 2015-01-10 NOTE — Assessment & Plan Note (Signed)
Cardiac cath showed nonobstructive CAD and normal LV function. Patient is having no further problems. Continue atorvastatin. We'll check lipids and LFTs in 6 weeks.

## 2015-01-10 NOTE — Patient Instructions (Signed)
Your physician recommends that you schedule a follow-up appointment in: 2 months with Dr. Harl Bowie  Your physician recommends that you continue on your current medications as directed. Please refer to the Current Medication list given to you today.  WE HAVE REFILLED YOUR MEDICATION  Thank you for choosing [facility]!!

## 2015-01-10 NOTE — Assessment & Plan Note (Signed)
Patient is commended for quitting smoking.

## 2015-01-10 NOTE — Progress Notes (Signed)
Cardiology Office Note   Date:  01/10/2015   ID:  HENNY STRAUCH, DOB March 19, 1941, MRN [id number]  PCP:  Glo Herring., MD  Cardiologist:  Dr. Roderic Palau branch   Chief Complaint:    History of Present Illness: Lindsay Mitchell is a 74 y.o. female who presents for post hospital follow-up. She was admitted to [facility] with abdominal pain and found to have a diverticular abscess. She then developed chest pain and shortness of breath with systolic blood pressures in the 60s. She was treated with dopamine and aggressive IV fluids. EKG showed no ischemic changes and 2-D echo 419/16  showed normal LV function but subtle RV changes however CT was negative for PE. Her troponins were elevated with a peak of 1.9 and she was transferred to [facility] for cardiac catheterization. This showed moderate to heavy three-vessel coronary calcification but no significant obstructive coronary disease as noted. There is an eccentric 40% proximal RCA with normal LV function EF 55% with no regional wall motion abnormality. She also had hypoxic respiratory failure and volume overload.  Patient returns for follow-up. She is a little confused over medications and whether or not she needs cardiac meds. She had one episode of chest pain after eating a heavy meal and it was relieved with drinking water. Overall she has done well without any further chest tightness, pressure, dizziness or presyncope. She was sent home on oxygen at 3 L but doesn't think she needs it. She quit smoking. She is getting a little stronger every day.    Past Medical History  Diagnosis Date  . Constipation   . Depression with anxiety   . Diverticulosis   . DVT (deep venous thrombosis)   . COPD (chronic obstructive pulmonary disease)     Emphysema radiographically  . Carotid artery disease     a. duplex 05/3789: RICA <24%, LICA 09-73%. Followed by [facility].  . Anxiety   . TIA (transient ischemic attack)   . Full dentures   . Pulmonary nodules  08/11/2013    a. CT angio 08/2013: 4-49mm nodules in RUL/RML, f/u recommended 6 months.  . Migraine   . Fibromyalgia   . Stroke   . Protein calorie malnutrition   . First degree AV block     Past Surgical History  Procedure Laterality Date  . Abdominal hysterectomy  1971  . Back surgery      X3  . Colonoscopy  June 2002    Dr. Ferdinand Lango: severe diverticular disease with haustral hypertrophy, primary and secondary diverticulosis, persistent spasticity, early stenosis  . Colonoscopy with propofol N/A 06/21/2013    Dr. Oneida Alar: sigmoid colon and descending colon with diverticula, small internal hemorrhoids  . Tonsillectomy    . Appendectomy    . Eye surgery      both cataracts-lenses  . Nasal sinus surgery Right 07/11/2013    Procedure: RIGHT ENDOSCOPIC ANTERIOR ETHMOIDECTOMY/RIGHT ENDOSCOPIC MAXILLARY ANTROSTOMY WITH REMOVAL OF TISSUE;  Surgeon: Ascencion Dike, MD;  Location: [place];  Service: ENT;  Laterality: Right;  . Esophagogastroduodenoscopy N/A 03/06/2014    Procedure: ESOPHAGOGASTRODUODENOSCOPY (EGD);  Surgeon: Danie Binder, MD;  [facility];  Service: Endoscopy;  Laterality: N/A;  9;30  . Savory dilation N/A 03/06/2014    Procedure: SAVORY DILATION;  Surgeon: Danie Binder, MD;  [facility];  Service: Endoscopy;  Laterality: N/A;  . Esophageal biopsy  03/06/2014    Procedure: BIOPSY;  Surgeon: Danie Binder, MD;  [facility];  Service: Endoscopy;;  . Other surgical history      scar tissue removal x2  . Left heart catheterization with coronary angiogram N/A 12/21/2014    Procedure: LEFT HEART CATHETERIZATION WITH CORONARY ANGIOGRAM;  Surgeon: Belva Crome, MD;  Location: [facility] CATH LAB;  Service: Cardiovascular;  Laterality: N/A;     Current Outpatient Prescriptions  Medication Sig Dispense Refill  . albuterol (PROVENTIL HFA;VENTOLIN HFA) 108 (90 BASE) MCG/ACT inhaler Inhale 2 puffs into the lungs every 4 (four) hours as needed  for wheezing or shortness of breath. 1 Inhaler 12  . aspirin 81 MG chewable tablet Chew 1 tablet (81 mg total) by mouth daily. 30 tablet 1  . atorvastatin (LIPITOR) 80 MG tablet Take 1 tablet (80 mg total) by mouth daily at 6 PM. 30 tablet 2  . budesonide-formoterol (SYMBICORT) 80-4.5 MCG/ACT inhaler Inhale 2 puffs into the lungs 2 (two) times daily. 1 Inhaler 12  . furosemide (LASIX) 40 MG tablet Take 1 tablet (40 mg total) by mouth daily. 30 tablet 0  . gabapentin (NEURONTIN) 300 MG capsule Take 3 capsules (900 mg total) by mouth 3 (three) times daily. 270 capsule 6  . HYDROcodone-acetaminophen (NORCO) 10-325 MG per tablet Take 1 tablet by mouth every 6 (six) hours as needed for moderate pain or severe pain.    Marland Kitchen ipratropium-albuterol (DUONEB) 0.5-2.5 (3) MG/3ML SOLN Take 3 mLs by nebulization 3 (three) times daily.    . metroNIDAZOLE (FLAGYL) 500 MG tablet Take 1 tablet (500 mg total) by mouth 3 (three) times daily. 10 day course starting on 12/13/2014 42 tablet 0  . Oxcarbazepine (TRILEPTAL) 300 MG tablet Take 1 tablet (300 mg total) by mouth 2 (two) times daily. (Patient not taking: Reported on 12/15/2014) 60 tablet 6  . potassium chloride (K-DUR) 10 MEQ tablet Take 2 tablets (20 mEq total) by mouth daily. 20 tablet 0  . predniSONE (DELTASONE) 5 MG tablet 8 tablets for 3 days 6 tablets for 3 days 5 tablets for 3 days 4 tablets for 3 days 3 tablets for 3 days 2 tablets for 3 days 1 tablet for 3 days then discontinue 200 tablet 0   No current facility-administered medications for this visit.    Allergies:   Codeine; Oxycodone; Talwin; and Valium    Social History:  The patient  reports that she has been smoking Cigarettes.  She has a 50 pack-year smoking history. She does not have any smokeless tobacco history on file. She reports that she does not drink alcohol or use illicit drugs.   Family History:  The patient's    family history includes Breast cancer in her mother; Cancer in her  father and mother; Diabetes in her brother; Heart attack in her mother; Heart disease in her brother and mother; Hypertension in her brother and mother; Other in her mother; Throat cancer in her father; Varicose Veins in her mother. There is no history of Colon cancer.    ROS:  Please see the history of present illness.   Otherwise, review of systems are positive for good appetite on steroids but usually decreased appetite when she stopped the steroids. Weight loss with recent illness.   All other systems are reviewed and negative.    PHYSICAL EXAM: VS:  Ht 5\' 3"  (1.6 m)  Wt 108 lb (48.988 kg)  BMI 19.14 kg/m2 , BMI Body mass index is 19.14 kg/(m^2). GEN: Well nourished, well developed, in no acute distress Neck: no JVD, HJR, carotid bruits, or masses  Cardiac: RRR; no murmurs,gallop, rubs, thrill or heave,  Respiratory:  Decreased breath sounds throughout but clear to auscultation bilaterally, normal work of breathing GI: soft, nontender, nondistended, + BS MS: no deformity or atrophy Extremities: Right arm and cath site without hematoma or hemorrhage good radial brachial pulses, otherwise lower extremities without cyanosis, clubbing, edema, good distal pulses bilaterally.  Skin: warm and dry, no rash Neuro:  Strength and sensation are intact    EKG:  EKG is not ordered today.    Recent Labs: 12/23/2014: B Natriuretic Peptide 1234.6* 12/24/2014: Magnesium 2.4 12/27/2014: ALT 11; BUN 43*; Creatinine 1.24*; Potassium 3.7; Sodium 137 12/28/2014: Hemoglobin 13.1; Platelets 411*    Lipid Panel    Component Value Date/Time   CHOL 81 12/23/2014 0515   CHOL 192 01/10/2013 1748   TRIG 49 12/23/2014 0515   TRIG 145 04/12/2013 1653   TRIG 116 01/10/2013 1748   HDL 26* 12/23/2014 0515   HDL 61 04/12/2013 1653   HDL 55 01/10/2013 1748   CHOLHDL 3.1 12/23/2014 0515   VLDL 10 12/23/2014 0515   LDLCALC 45 12/23/2014 0515   LDLCALC 73 04/12/2013 1653   LDLCALC 114* 01/10/2013 1748       Wt Readings from Last 3 Encounters:  01/10/15 108 lb (48.988 kg)  12/28/14 108 lb 14.5 oz (49.4 kg)  11/23/14 113 lb 9.6 oz (51.529 kg)      Other studies Reviewed: Additional studies/ records that were reviewed today include and review of the records demonstrates:  2-D echo 12/19/14 Study Conclusions  - Left ventricle: The cavity size was normal, consistent with   septal shift. Systolic function was normal. The estimated   ejection fraction was in the range of 60% to 65%. Wall motion was   normal; there were no regional wall motion abnormalities. Left   ventricular diastolic function parameters were normal. - Aortic valve: Valve area (VTI): 2.06 cm^2. Valve area (Vmax):   1.86 cm^2. - Right ventricle: There is mild ventricular septal flattening   during systole suggesting RV pressure overload. The cavity size   was moderately dilated. The RV shares the apex with the LV.   Systolic function was mildly reduced. RV TAPSE is 1.5 cm,   tricuspid annular tissue velocity 9 cm/s. - Right atrium: The atrium was mildly dilated. - Pulmonary arteries: PA peak pressure: 40 mm Hg (S). - Inferior vena cava: The vessel was dilated. The respirophasic   diameter changes were blunted (< 50%), consistent with elevated   central venous pressure. - Pericardium, extracardiac: There is a large left pleural   effusion. - Technically adequate study.  Impressions:  - Normal LV systolic function at 09-98%. RV poorly visualized in   prior 11/07/14 study, however does appear to be somewhat bigger   with mildly decreased function compared to prior study (RV TAPSE   decrased from 1.8 to 1.5).  Cardiac catheterization 12/21/14 ANGIOGRAPHIC DATA:   The left main coronary artery is calcified but widely patent.  The left anterior descending artery is widely patent and wraps around the left ventricular apex. The proximal segment has heavy calcification. Within the proximal calcified segment there is ectasia.  No obstruction is noted. Tortuosity is noted in the mid and distal LAD beyond the diagonal. No obstruction is seen..  The left circumflex artery is moderately calcified. 4 obtuse marginals arise from the circumflex. No significant obstruction is seen..  The right coronary artery is large vessel giving origin to a tortuous PDA. The proximal RCA contains 40%  narrowing. Irregularities are noted in the mid vessel.Marland Kitchen  LEFT VENTRICULOGRAM:  Left ventricular angiogram was done in the 30 RAO projection and revealed normal sized LV cavity with an EF of 55%.   IMPRESSIONS:  1. Moderate to heavy three-vessel coronary calcification  2. No significant obstructive coronary disease is noted. There is an eccentric 40% proximal RCA.  3. Overall normal LV function with EF 55% and no regional wall motion abnormality   RECOMMENDATION:  Further management per treating team.  The discontinue IV heparin and start subcutaneous heparin for DVT prophylaxis.                 ASSESSMENT AND PLAN: NSTEMI (non-ST elevated myocardial infarction) Cardiac cath showed nonobstructive CAD and normal LV function. Patient is having no further problems. Continue atorvastatin. We'll check lipids and LFTs in 6 weeks.   Essential hypertension Blood pressure has stabilized.   Tobacco abuse Patient is commended for quitting smoking.   Chronic diastolic heart failure Patient's heart failure is compensated. Continue Lasix and potassium at current dose. She just had blood work by Dr. Gerarda Fraction so I will not repeat. Follow-up with Dr. Harl Bowie in 2 months.      Signed, Ermalinda Barrios, PA-C  01/10/2015 1:33 PM    [facility] [place], [place], [place]  [address] Phone: [phone]; Fax: [phone]

## 2015-01-10 NOTE — Assessment & Plan Note (Signed)
Blood pressure has stabilized.

## 2015-01-10 NOTE — Assessment & Plan Note (Signed)
Patient's heart failure is compensated. Continue Lasix and potassium at current dose. She just had blood work by Dr. Gerarda Fraction so I will not repeat. Follow-up with Dr. Harl Bowie in 2 months.

## 2015-01-11 DIAGNOSIS — F418 Other specified anxiety disorders: Secondary | ICD-10-CM | POA: Diagnosis not present

## 2015-01-11 DIAGNOSIS — E43 Unspecified severe protein-calorie malnutrition: Secondary | ICD-10-CM | POA: Diagnosis not present

## 2015-01-11 DIAGNOSIS — K5780 Diverticulitis of intestine, part unspecified, with perforation and abscess without bleeding: Secondary | ICD-10-CM | POA: Diagnosis not present

## 2015-01-11 DIAGNOSIS — D649 Anemia, unspecified: Secondary | ICD-10-CM | POA: Diagnosis not present

## 2015-01-11 DIAGNOSIS — J441 Chronic obstructive pulmonary disease with (acute) exacerbation: Secondary | ICD-10-CM | POA: Diagnosis not present

## 2015-01-11 DIAGNOSIS — Z8673 Personal history of transient ischemic attack (TIA), and cerebral infarction without residual deficits: Secondary | ICD-10-CM | POA: Diagnosis not present

## 2015-01-11 DIAGNOSIS — F17200 Nicotine dependence, unspecified, uncomplicated: Secondary | ICD-10-CM | POA: Diagnosis not present

## 2015-01-11 DIAGNOSIS — I10 Essential (primary) hypertension: Secondary | ICD-10-CM | POA: Diagnosis not present

## 2015-01-18 DIAGNOSIS — D649 Anemia, unspecified: Secondary | ICD-10-CM | POA: Diagnosis not present

## 2015-01-18 DIAGNOSIS — F418 Other specified anxiety disorders: Secondary | ICD-10-CM | POA: Diagnosis not present

## 2015-01-18 DIAGNOSIS — E43 Unspecified severe protein-calorie malnutrition: Secondary | ICD-10-CM | POA: Diagnosis not present

## 2015-01-18 DIAGNOSIS — I10 Essential (primary) hypertension: Secondary | ICD-10-CM | POA: Diagnosis not present

## 2015-01-18 DIAGNOSIS — K5780 Diverticulitis of intestine, part unspecified, with perforation and abscess without bleeding: Secondary | ICD-10-CM | POA: Diagnosis not present

## 2015-01-18 DIAGNOSIS — J441 Chronic obstructive pulmonary disease with (acute) exacerbation: Secondary | ICD-10-CM | POA: Diagnosis not present

## 2015-01-18 DIAGNOSIS — Z8673 Personal history of transient ischemic attack (TIA), and cerebral infarction without residual deficits: Secondary | ICD-10-CM | POA: Diagnosis not present

## 2015-01-18 DIAGNOSIS — F17200 Nicotine dependence, unspecified, uncomplicated: Secondary | ICD-10-CM | POA: Diagnosis not present

## 2015-01-27 DIAGNOSIS — J449 Chronic obstructive pulmonary disease, unspecified: Secondary | ICD-10-CM | POA: Diagnosis not present

## 2015-02-14 ENCOUNTER — Encounter (HOSPITAL_COMMUNITY): Payer: Self-pay | Admitting: Emergency Medicine

## 2015-02-14 ENCOUNTER — Emergency Department (HOSPITAL_COMMUNITY): Payer: Medicare Other

## 2015-02-14 ENCOUNTER — Observation Stay (HOSPITAL_COMMUNITY): Payer: Medicare Other

## 2015-02-14 ENCOUNTER — Observation Stay (HOSPITAL_COMMUNITY)
Admission: EM | Admit: 2015-02-14 | Discharge: 2015-02-15 | Disposition: A | Discharge status: Home / Self Care | Payer: Medicare Other | Attending: Internal Medicine | Admitting: Internal Medicine

## 2015-02-14 DIAGNOSIS — R531 Weakness: Secondary | ICD-10-CM | POA: Diagnosis not present

## 2015-02-14 DIAGNOSIS — F418 Other specified anxiety disorders: Secondary | ICD-10-CM | POA: Insufficient documentation

## 2015-02-14 DIAGNOSIS — K5900 Constipation, unspecified: Secondary | ICD-10-CM | POA: Diagnosis not present

## 2015-02-14 DIAGNOSIS — Z87891 Personal history of nicotine dependence: Secondary | ICD-10-CM | POA: Diagnosis not present

## 2015-02-14 DIAGNOSIS — Z7951 Long term (current) use of inhaled steroids: Secondary | ICD-10-CM | POA: Diagnosis not present

## 2015-02-14 DIAGNOSIS — Z79899 Other long term (current) drug therapy: Secondary | ICD-10-CM | POA: Diagnosis not present

## 2015-02-14 DIAGNOSIS — R042 Hemoptysis: Secondary | ICD-10-CM | POA: Diagnosis not present

## 2015-02-14 DIAGNOSIS — Z720 Tobacco use: Secondary | ICD-10-CM | POA: Diagnosis present

## 2015-02-14 DIAGNOSIS — Z9981 Dependence on supplemental oxygen: Secondary | ICD-10-CM | POA: Insufficient documentation

## 2015-02-14 DIAGNOSIS — E46 Unspecified protein-calorie malnutrition: Secondary | ICD-10-CM | POA: Diagnosis not present

## 2015-02-14 DIAGNOSIS — R109 Unspecified abdominal pain: Secondary | ICD-10-CM

## 2015-02-14 DIAGNOSIS — M797 Fibromyalgia: Secondary | ICD-10-CM | POA: Insufficient documentation

## 2015-02-14 DIAGNOSIS — J449 Chronic obstructive pulmonary disease, unspecified: Secondary | ICD-10-CM | POA: Insufficient documentation

## 2015-02-14 DIAGNOSIS — Z8673 Personal history of transient ischemic attack (TIA), and cerebral infarction without residual deficits: Secondary | ICD-10-CM | POA: Diagnosis not present

## 2015-02-14 DIAGNOSIS — I5032 Chronic diastolic (congestive) heart failure: Secondary | ICD-10-CM | POA: Diagnosis not present

## 2015-02-14 DIAGNOSIS — I779 Disorder of arteries and arterioles, unspecified: Secondary | ICD-10-CM | POA: Insufficient documentation

## 2015-02-14 DIAGNOSIS — J189 Pneumonia, unspecified organism: Secondary | ICD-10-CM | POA: Diagnosis not present

## 2015-02-14 DIAGNOSIS — Z86018 Personal history of other benign neoplasm: Secondary | ICD-10-CM | POA: Diagnosis not present

## 2015-02-14 DIAGNOSIS — I10 Essential (primary) hypertension: Secondary | ICD-10-CM | POA: Diagnosis not present

## 2015-02-14 DIAGNOSIS — Z23 Encounter for immunization: Secondary | ICD-10-CM | POA: Diagnosis not present

## 2015-02-14 DIAGNOSIS — R55 Syncope and collapse: Secondary | ICD-10-CM | POA: Diagnosis not present

## 2015-02-14 DIAGNOSIS — R0902 Hypoxemia: Secondary | ICD-10-CM | POA: Diagnosis present

## 2015-02-14 DIAGNOSIS — K5790 Diverticulosis of intestine, part unspecified, without perforation or abscess without bleeding: Secondary | ICD-10-CM | POA: Diagnosis not present

## 2015-02-14 DIAGNOSIS — G43909 Migraine, unspecified, not intractable, without status migrainosus: Secondary | ICD-10-CM | POA: Insufficient documentation

## 2015-02-14 DIAGNOSIS — J9620 Acute and chronic respiratory failure, unspecified whether with hypoxia or hypercapnia: Secondary | ICD-10-CM | POA: Diagnosis present

## 2015-02-14 DIAGNOSIS — Z6821 Body mass index (BMI) 21.0-21.9, adult: Secondary | ICD-10-CM | POA: Diagnosis not present

## 2015-02-14 DIAGNOSIS — I440 Atrioventricular block, first degree: Secondary | ICD-10-CM | POA: Insufficient documentation

## 2015-02-14 DIAGNOSIS — R05 Cough: Secondary | ICD-10-CM | POA: Diagnosis not present

## 2015-02-14 DIAGNOSIS — J9621 Acute and chronic respiratory failure with hypoxia: Secondary | ICD-10-CM | POA: Diagnosis not present

## 2015-02-14 DIAGNOSIS — Z7982 Long term (current) use of aspirin: Secondary | ICD-10-CM | POA: Insufficient documentation

## 2015-02-14 DIAGNOSIS — E43 Unspecified severe protein-calorie malnutrition: Secondary | ICD-10-CM | POA: Diagnosis present

## 2015-02-14 DIAGNOSIS — R0602 Shortness of breath: Secondary | ICD-10-CM | POA: Diagnosis not present

## 2015-02-14 HISTORY — DX: Dependence on supplemental oxygen: Z99.81

## 2015-02-14 LAB — URINE MICROSCOPIC-ADD ON

## 2015-02-14 LAB — URINALYSIS, ROUTINE W REFLEX MICROSCOPIC
Bilirubin Urine: NEGATIVE
Glucose, UA: NEGATIVE mg/dL
Hgb urine dipstick: NEGATIVE
Ketones, ur: 15 mg/dL — AB
Leukocytes, UA: NEGATIVE
Nitrite: NEGATIVE
Protein, ur: 100 mg/dL — AB
Specific Gravity, Urine: >1.03 — ABNORMAL HIGH (ref 1.005–1.030)
Urobilinogen, UA: 1 mg/dL (ref 0.0–1.0)
pH: 5.5 (ref 5.0–8.0)

## 2015-02-14 LAB — COMPREHENSIVE METABOLIC PANEL
ALT: 11 U/L — ABNORMAL LOW (ref 14–54)
AST: 19 U/L (ref 15–41)
Albumin: 3.3 g/dL — ABNORMAL LOW (ref 3.5–5.0)
Alkaline Phosphatase: 97 U/L (ref 38–126)
Anion gap: 8 (ref 5–15)
BUN: 25 mg/dL — ABNORMAL HIGH (ref 6–20)
CO2: 33 mmol/L — ABNORMAL HIGH (ref 22–32)
Calcium: 9 mg/dL (ref 8.9–10.3)
Chloride: 96 mmol/L — ABNORMAL LOW (ref 101–111)
Creatinine, Ser: 0.87 mg/dL (ref 0.44–1.00)
GFR calc Af Amer: >60 mL/min (ref >60)
GFR calc non Af Amer: >60 mL/min (ref >60)
Glucose, Bld: 122 mg/dL — ABNORMAL HIGH (ref 65–99)
Potassium: 3.8 mmol/L (ref 3.5–5.1)
Sodium: 137 mmol/L (ref 135–145)
Total Bilirubin: 1 mg/dL (ref 0.3–1.2)
Total Protein: 6.8 g/dL (ref 6.5–8.1)

## 2015-02-14 LAB — CBC WITH DIFFERENTIAL/PLATELET
Basophils Absolute: 0.1 10*3/uL (ref 0.0–0.1)
Basophils Relative: 0 % (ref 0–1)
Eosinophils Absolute: 0 10*3/uL (ref 0.0–0.7)
Eosinophils Relative: 0 % (ref 0–5)
HCT: 40.1 % (ref 36.0–46.0)
Hemoglobin: 12.8 g/dL (ref 12.0–15.0)
Lymphocytes Relative: 11 % — ABNORMAL LOW (ref 12–46)
Lymphs Abs: 1.6 10*3/uL (ref 0.7–4.0)
MCH: 31.4 pg (ref 26.0–34.0)
MCHC: 31.9 g/dL (ref 30.0–36.0)
MCV: 98.3 fL (ref 78.0–100.0)
Monocytes Absolute: 1.9 10*3/uL — ABNORMAL HIGH (ref 0.1–1.0)
Monocytes Relative: 13 % — ABNORMAL HIGH (ref 3–12)
Neutro Abs: 11.5 10*3/uL — ABNORMAL HIGH (ref 1.7–7.7)
Neutrophils Relative %: 76 % (ref 43–77)
Platelets: 218 10*3/uL (ref 150–400)
RBC: 4.08 MIL/uL (ref 3.87–5.11)
RDW: 13.7 % (ref 11.5–15.5)
WBC: 15 10*3/uL — ABNORMAL HIGH (ref 4.0–10.5)

## 2015-02-14 LAB — TROPONIN I: Troponin I: 0.03 ng/mL (ref <0.031)

## 2015-02-14 LAB — LACTIC ACID, PLASMA: Lactic Acid, Venous: 0.9 mmol/L (ref 0.5–2.0)

## 2015-02-14 LAB — BRAIN NATRIURETIC PEPTIDE: B Natriuretic Peptide: 69 pg/mL (ref 0.0–100.0)

## 2015-02-14 LAB — TSH: TSH: 0.341 u[IU]/mL — ABNORMAL LOW (ref 0.350–4.500)

## 2015-02-14 LAB — LIPASE, BLOOD: Lipase: 23 U/L (ref 22–51)

## 2015-02-14 LAB — VITAMIN B12: Vitamin B-12: 245 pg/mL (ref 180–914)

## 2015-02-14 MED ORDER — ALUM & MAG HYDROXIDE-SIMETH 200-200-20 MG/5ML PO SUSP: 30.0000 mL | Freq: Four times a day (QID) | ORAL | Status: DC | PRN

## 2015-02-14 MED ORDER — ALBUTEROL SULFATE (2.5 MG/3ML) 0.083% IN NEBU
2.5000 mg | INHALATION_SOLUTION | RESPIRATORY_TRACT | Status: DC
  Administered 2015-02-14 – 2015-02-15 (×6): 2.5 mg via RESPIRATORY_TRACT
  Filled 2015-02-14 (×6): qty 3

## 2015-02-14 MED ORDER — ATORVASTATIN CALCIUM 40 MG PO TABS
80.0000 mg | ORAL_TABLET | Freq: Every day | ORAL | Status: DC
  Administered 2015-02-14 – 2015-02-15 (×2): 80 mg via ORAL
  Filled 2015-02-14 (×2): qty 2

## 2015-02-14 MED ORDER — GUAIFENESIN ER 600 MG PO TB12
600.0000 mg | ORAL_TABLET | Freq: Two times a day (BID) | ORAL | Status: DC
  Administered 2015-02-14 – 2015-02-15 (×2): 600 mg via ORAL
  Filled 2015-02-14 (×2): qty 1

## 2015-02-14 MED ORDER — HYDROCODONE-ACETAMINOPHEN 10-325 MG PO TABS
1.0000 | ORAL_TABLET | Freq: Four times a day (QID) | ORAL | Status: DC | PRN
  Administered 2015-02-14 – 2015-02-15 (×2): 1 via ORAL
  Filled 2015-02-14: qty 1

## 2015-02-14 MED ORDER — MILK AND MOLASSES ENEMA
1.0000 | Freq: Once | RECTAL | Status: AC
  Administered 2015-02-14: 250 mL via RECTAL

## 2015-02-14 MED ORDER — ENOXAPARIN SODIUM 40 MG/0.4ML ~~LOC~~ SOLN
40.0000 mg | SUBCUTANEOUS | Status: DC
  Administered 2015-02-14 – 2015-02-15 (×2): 40 mg via SUBCUTANEOUS
  Filled 2015-02-14 (×2): qty 0.4

## 2015-02-14 MED ORDER — SODIUM CHLORIDE 0.9 % IV SOLN
INTRAVENOUS | Status: DC
  Administered 2015-02-14 – 2015-02-15 (×2): via INTRAVENOUS

## 2015-02-14 MED ORDER — BUDESONIDE-FORMOTEROL FUMARATE 80-4.5 MCG/ACT IN AERO
2.0000 | INHALATION_SPRAY | Freq: Two times a day (BID) | RESPIRATORY_TRACT | Status: DC
  Administered 2015-02-14 – 2015-02-15 (×2): 2 via RESPIRATORY_TRACT
  Filled 2015-02-14: qty 6.9

## 2015-02-14 MED ORDER — ASPIRIN 81 MG PO CHEW
81.0000 mg | CHEWABLE_TABLET | Freq: Every day | ORAL | Status: DC
  Administered 2015-02-15: 81 mg via ORAL
  Filled 2015-02-14: qty 1

## 2015-02-14 MED ORDER — ALBUTEROL SULFATE (2.5 MG/3ML) 0.083% IN NEBU: 2.5000 mg | INHALATION_SOLUTION | RESPIRATORY_TRACT | Status: DC | PRN

## 2015-02-14 MED ORDER — BISACODYL 10 MG RE SUPP: 10.0000 mg | Freq: Every day | RECTAL | Status: DC | PRN

## 2015-02-14 MED ORDER — ONDANSETRON HCL 4 MG/2ML IJ SOLN
4.0000 mg | Freq: Four times a day (QID) | INTRAMUSCULAR | Status: DC | PRN
  Administered 2015-02-14: 4 mg via INTRAVENOUS
  Filled 2015-02-14: qty 2

## 2015-02-14 MED ORDER — PNEUMOCOCCAL VAC POLYVALENT 25 MCG/0.5ML IJ INJ
0.5000 mL | INJECTION | INTRAMUSCULAR | Status: AC
  Administered 2015-02-15: 0.5 mL via INTRAMUSCULAR
  Filled 2015-02-14: qty 0.5

## 2015-02-14 MED ORDER — METHYLPREDNISOLONE SODIUM SUCC 125 MG IJ SOLR
125.0000 mg | Freq: Once | INTRAMUSCULAR | Status: AC
  Administered 2015-02-14: 125 mg via INTRAVENOUS
  Filled 2015-02-14: qty 2

## 2015-02-14 MED ORDER — SODIUM CHLORIDE 0.9 % IJ SOLN
3.0000 mL | Freq: Two times a day (BID) | INTRAMUSCULAR | Status: DC
  Administered 2015-02-14 – 2015-02-15 (×2): 3 mL via INTRAVENOUS

## 2015-02-14 MED ORDER — ACETAMINOPHEN 650 MG RE SUPP: 650.0000 mg | Freq: Four times a day (QID) | RECTAL | Status: DC | PRN

## 2015-02-14 MED ORDER — SODIUM CHLORIDE 0.9 % IV BOLUS (SEPSIS)
500.0000 mL | Freq: Once | INTRAVENOUS | Status: AC
  Administered 2015-02-14: 500 mL via INTRAVENOUS

## 2015-02-14 MED ORDER — TRAZODONE HCL 50 MG PO TABS
25.0000 mg | ORAL_TABLET | Freq: Every evening | ORAL | Status: DC | PRN
  Administered 2015-02-14: 25 mg via ORAL
  Filled 2015-02-14: qty 1

## 2015-02-14 MED ORDER — GABAPENTIN 300 MG PO CAPS
900.0000 mg | ORAL_CAPSULE | Freq: Three times a day (TID) | ORAL | Status: DC
  Administered 2015-02-14 – 2015-02-15 (×4): 900 mg via ORAL
  Filled 2015-02-14 (×4): qty 3

## 2015-02-14 MED ORDER — IPRATROPIUM-ALBUTEROL 0.5-2.5 (3) MG/3ML IN SOLN
3.0000 mL | Freq: Once | RESPIRATORY_TRACT | Status: AC
  Administered 2015-02-14: 3 mL via RESPIRATORY_TRACT
  Filled 2015-02-14: qty 3

## 2015-02-14 MED ORDER — HYDROCODONE-ACETAMINOPHEN 10-325 MG PO TABS
1.0000 | ORAL_TABLET | Freq: Four times a day (QID) | ORAL | Status: DC | PRN
  Administered 2015-02-14: 1 via ORAL
  Filled 2015-02-14 (×2): qty 1

## 2015-02-14 MED ORDER — MAGNESIUM CITRATE PO SOLN
1.0000 | Freq: Once | ORAL | Status: AC
  Administered 2015-02-14: 1 via ORAL
  Filled 2015-02-14: qty 296

## 2015-02-14 MED ORDER — ONDANSETRON HCL 4 MG PO TABS: 4.0000 mg | ORAL_TABLET | Freq: Four times a day (QID) | ORAL | Status: DC | PRN

## 2015-02-14 MED ORDER — ACETAMINOPHEN 325 MG PO TABS: 650.0000 mg | ORAL_TABLET | Freq: Four times a day (QID) | ORAL | Status: DC | PRN

## 2015-02-14 MED ORDER — SODIUM CHLORIDE 0.9 % IV SOLN: INTRAVENOUS | Status: DC

## 2015-02-14 NOTE — ED Notes (Signed)
Pt reports started having productive cough and chest pain x 5 days.  Fever Sunday.  Went to [facility] office today and was sent here for futher evaluation.

## 2015-02-14 NOTE — ED Notes (Signed)
Patient ambulated with o2 at 3 lpm.  Patient's o2 sat dropped to 87-88%.  Patient denies any SOB and stated this was Normal for her

## 2015-02-14 NOTE — H&P (Signed)
[facility] History and Physical  NAYLEEN JANOSIK XBD:[id number] DOB: Nov 03, 1940 DOA: 02/14/2015  Referring physician: Thurnell Garbe PCP: Glo Herring., MD   Chief Complaint: generalized weakness  HPI: Lindsay Mitchell is a 74 y.o. female past medical history that includes chronic diastolic heart failure, chronic respiratory failure related to COPD, CAD recent diverticular abscess, or myalgia, hypotension, recent non-ST elevated myocardial infarction presents to emergency Department chief complaint generalized weakness. Initial evaluation reveals acute on chronic respiratory failure with oxygen saturation dropping to 87% on her usual 3 L of oxygen. Patient reports a day history of generalized weakness/fatigue. She went to see her primary care provider and reportedly was hypoxic in referred to the emergency department. Associated symptoms include moist productive cough and constipation. She reports that recently her Lasix dose was decreased from 40 mg to 20 mg. She denies chest pain palpitation headache visual disturbances numbness tingling of extremities. She denies abdominal pain nausea vomiting diaphoresis. Workup in the emergency department includes complete blood count significant for leukocytosis of 15.0, chloride 96, CO2 33 BUN 25 serum glucose 122. chest x-ray with no active disease and mild hyperinflation. No acute infiltrate or pleural effusion. Urinalysis with many squama cells and many bacteria 3-6 WBCs. While in the emergency department blood pressure is on the soft side which according to chart review is her baseline she is afebrile and oxygen saturation level 91% on 2-1/2 L nasal cannula. She is given 500 mils of normal saline Solu-Medrol and a breathing treatment.    Review of Systems:  10 point review of systems complete systems negative except as indicated in the history of present illness Past Medical History  Diagnosis Date  . Constipation   . Depression with anxiety   .  Diverticulosis   . DVT (deep venous thrombosis)   . COPD (chronic obstructive pulmonary disease)     Emphysema radiographically  . Carotid artery disease     a. duplex 04/3418: RICA <62%, LICA 22-97%. Followed by [facility].  . Anxiety   . TIA (transient ischemic attack)   . Full dentures   . Pulmonary nodules 08/11/2013    a. CT angio 08/2013: 4-81mm nodules in RUL/RML, f/u recommended 6 months.  . Migraine   . Fibromyalgia   . Stroke   . Protein calorie malnutrition   . First degree AV block   . On home O2    Past Surgical History  Procedure Laterality Date  . Abdominal hysterectomy  1971  . Back surgery      X3  . Colonoscopy  June 2002    Dr. Ferdinand Lango: severe diverticular disease with haustral hypertrophy, primary and secondary diverticulosis, persistent spasticity, early stenosis  . Colonoscopy with propofol N/A 06/21/2013    Dr. Oneida Alar: sigmoid colon and descending colon with diverticula, small internal hemorrhoids  . Tonsillectomy    . Appendectomy    . Eye surgery      both cataracts-lenses  . Nasal sinus surgery Right 07/11/2013    Procedure: RIGHT ENDOSCOPIC ANTERIOR ETHMOIDECTOMY/RIGHT ENDOSCOPIC MAXILLARY ANTROSTOMY WITH REMOVAL OF TISSUE;  Surgeon: Ascencion Dike, MD;  Location: [place];  Service: ENT;  Laterality: Right;  . Esophagogastroduodenoscopy N/A 03/06/2014    Procedure: ESOPHAGOGASTRODUODENOSCOPY (EGD);  Surgeon: Danie Binder, MD;  [facility];  Service: Endoscopy;  Laterality: N/A;  9;30  . Savory dilation N/A 03/06/2014    Procedure: SAVORY DILATION;  Surgeon: Danie Binder, MD;  [facility];  Service: Endoscopy;  Laterality: N/A;  .  Esophageal biopsy  03/06/2014    Procedure: BIOPSY;  Surgeon: Danie Binder, MD;  [facility];  Service: Endoscopy;;  . Other surgical history      scar tissue removal x2  . Left heart catheterization with coronary angiogram N/A 12/21/2014    Procedure: LEFT HEART CATHETERIZATION WITH  CORONARY ANGIOGRAM;  Surgeon: Belva Crome, MD;  Location: [facility] CATH LAB;  Service: Cardiovascular;  Laterality: N/A;   Social History:  reports that she quit smoking about 2 months ago. Her smoking use included Cigarettes. She has a 50 pack-year smoking history. She does not have any smokeless tobacco history on file. She reports that she does not drink alcohol or use illicit drugs.  Allergies  Allergen Reactions  . Codeine Other (See Comments)    Makes patient not in right state of mind.   . Oxycodone Itching  . Talwin [Pentazocine] Other (See Comments)    Makes patient not in right state of mind.   . Valium [Diazepam] Other (See Comments)    Just doesn't work.     Family History  Problem Relation Age of Onset  . Colon cancer Neg Hx   . Breast cancer Mother   . Heart disease Mother   . Hypertension Mother   . Heart attack Mother   . Other Mother     varicose veins  . Cancer Mother   . Varicose Veins Mother   . Diabetes Brother   . Hypertension Brother   . Heart disease Brother   . Throat cancer Father   . Cancer Father      Prior to Admission medications   Medication Sig Start Date End Date Taking? Authorizing Provider  albuterol (PROVENTIL HFA;VENTOLIN HFA) 108 (90 BASE) MCG/ACT inhaler Inhale 2 puffs into the lungs every 4 (four) hours as needed for wheezing or shortness of breath. 08/12/13  Yes Rexene Alberts, MD  aspirin 81 MG chewable tablet Chew 1 tablet (81 mg total) by mouth daily. 12/28/14  Yes Reyne Dumas, MD  atorvastatin (LIPITOR) 80 MG tablet Take 1 tablet (80 mg total) by mouth daily at 6 PM. 01/10/15  Yes Imogene Burn, PA-C  budesonide-formoterol (SYMBICORT) 80-4.5 MCG/ACT inhaler Inhale 2 puffs into the lungs 2 (two) times daily. 12/28/14  Yes Reyne Dumas, MD  furosemide (LASIX) 40 MG tablet Take 1 tablet (40 mg total) by mouth daily. 01/10/15  Yes Imogene Burn, PA-C  gabapentin (NEURONTIN) 300 MG capsule Take 3 capsules (900 mg total) by mouth 3 (three)  times daily. 11/09/14  Yes Melvenia Beam, MD  HYDROcodone-acetaminophen (NORCO) 10-325 MG per tablet Take 1 tablet by mouth every 6 (six) hours as needed for moderate pain or severe pain.   Yes Historical Provider, MD  ipratropium-albuterol (DUONEB) 0.5-2.5 (3) MG/3ML SOLN Take 3 mLs by nebulization 3 (three) times daily. 08/12/13  Yes Rexene Alberts, MD  potassium chloride (K-DUR) 10 MEQ tablet Take 2 tablets (20 mEq total) by mouth daily. 01/10/15  Yes Imogene Burn, PA-C   Physical Exam: Filed Vitals:   02/14/15 1130 02/14/15 1200 02/14/15 1230 02/14/15 1300  BP: 104/50 107/57 92/51 95/51   Pulse: 94 95 89 87  Temp:      TempSrc:      Resp: 12  14 14   Height:      Weight:      SpO2: 92% 97% 99% 98%    Wt Readings from Last 3 Encounters:  02/14/15 50.803 kg (112 lb)  01/10/15 48.988 kg (108  lb)  12/28/14 49.4 kg (108 lb 14.5 oz)    General:  Appears calm and comfortable didn't frail Eyes: PERRL, normal lids, irises & conjunctiva ENT: grossly normal hearing, lips & tongue, he does membranes of her mouth are patent but slightly dry Neck: no LAD, masses or thyromegaly Cardiovascular: RRR, no m/r/g. No LE edema. Telemetry: SR, no arrhythmias  Respiratory:  Normal respiratory effort. Breath sounds with fair air movement fine crackles left lower base no wheeze slightly prolonged expiratory phase Abdomen: soft, ntnd positive bowel sounds no mass organomegaly no guarding Skin: no rash or induration seen on limited exam Musculoskeletal: grossly normal tone BUE/BLE Psychiatric: grossly normal mood and affect, speech fluent and appropriate Neurologic: grossly non-focal.          Labs on Admission:  Basic Metabolic Panel:  Recent Labs Lab 02/14/15 0940  NA 137  K 3.8  CL 96*  CO2 33*  GLUCOSE 122*  BUN 25*  CREATININE 0.87  CALCIUM 9.0   Liver Function Tests:  Recent Labs Lab 02/14/15 0940  AST 19  ALT 11*  ALKPHOS 97  BILITOT 1.0  PROT 6.8  ALBUMIN 3.3*     Recent Labs Lab 02/14/15 0940  LIPASE 23   No results for input(s): AMMONIA in the last 168 hours. CBC:  Recent Labs Lab 02/14/15 0940  WBC 15.0*  NEUTROABS 11.5*  HGB 12.8  HCT 40.1  MCV 98.3  PLT 218   Cardiac Enzymes:  Recent Labs Lab 02/14/15 0940  TROPONINI 0.03    BNP (last 3 results)  Recent Labs  12/23/14 0515 02/14/15 0949  BNP 1234.6* 69.0    ProBNP (last 3 results) No results for input(s): PROBNP in the last 8760 hours.  CBG: No results for input(s): GLUCAP in the last 168 hours.  Radiological Exams on Admission: Dg Chest Portable 1 View  02/14/2015   CLINICAL DATA:  Productive cough, congestion, shortness of breath for 1 week  EXAM: PORTABLE CHEST - 1 VIEW  COMPARISON:  12/26/2014  FINDINGS: Cardiomediastinal silhouette is stable. No acute infiltrate or pleural effusion. No pulmonary edema. Mild hyperinflation.  IMPRESSION: No active disease.  Mild hyperinflation.   Electronically Signed   By: Lahoma Crocker M.D.   On: 02/14/2015 09:50    EKG: Independently reviewed. EKG with sinus tach first-degree AV block  Assessment/Plan Principal Problem:   Acute on chronic respiratory failure with hypoxia: Mild in the setting of acute on chronic diastolic heart failure related to decrease in Lasix dose as well as the COPD. She received nebs and Solu-Medrol in the emergency department and oxygen saturation level very close to baseline on admission. Will admit to telemetry for observation. Will continue nebulizers. I'll thinks she needs any further sterile results at this point. Chest x-ray without infiltrate or edema. Monitor closely Active Problems: Chronic diastolic heart failure; does not appear particularly overloaded on admission. Her BNP within the limits of normal. Lactic acid within the limits of normal chest x-ray as noted above. She did mention the last week her Lasix dose was decreased from 40-20mg . A hold this for one dose a she looks slightly dry. On  its her intake and output obtain daily weights. Of note recent echo yields an EF of 65% with normal wall motion.  Generalized weakness.: Likely related to #1 and #2 in the setting of chronic generalized weakness related to fibromyalgia chronic pain syndrome, narcotics and protein calorie malnutrition. Will obtain a TSH vitamin B 12 and folate level. Will continue nutritional  supplements. Request PT evaluation    Constipation: Reports no bowel movement for 5 days. Also perceives constipation being the beginning of many medical issues. Will obtain abdominal film. Will provide milk and molasses enema.    COPD (chronic obstructive pulmonary disease): On 3 L of oxygen at home. She was given Solu-Medrol in the emergency department. Does not appear to be in exacerbation. See #1    Tobacco abuse: Cessation counseling offered    Protein-calorie malnutrition, severe: BMI 19.9. Nutritional supplements     Essential hypertension: Patient with a history of hypotension. Lasix dose just reduced. Chart review indicates her systolic blood pressure ranges 92-107 Currently in this range.       Code Status: full DVT Prophylaxis: Family Communication: husband at bedside Disposition Plan: home hopefully in am (indicate anticipated LOS)  Time spent: 22 minutes  [facility] Pager [phone]

## 2015-02-14 NOTE — ED Provider Notes (Signed)
CSN: [id number]     Arrival date & time 02/14/15  3536 History   First MD Initiated Contact with Patient 02/14/15 [phone]     Chief Complaint  Patient presents with  . Weakness     HPI Pt was seen at 1030. Per pt, c/o gradual onset and worsening of persistent generalized weakness/fatigue for the past 5 days. Has been associated with cough (productive of "green" sputum) as well as SOB and chills. Pt also states she "has been constipated" for the past week, having a "small" BM several days ago. Pt was evaluated by her PMD this morning, then told to come to the ED for further evaluation and admission. Pt denies CP/palpitations, no back pain, no abd pain, no N/V/D, no falls, no syncope.    Past Medical History  Diagnosis Date  . Constipation   . Depression with anxiety   . Diverticulosis   . DVT (deep venous thrombosis)   . COPD (chronic obstructive pulmonary disease)     Emphysema radiographically  . Carotid artery disease     a. duplex 09/5398: RICA <86%, LICA 76-19%. Followed by [facility].  . Anxiety   . TIA (transient ischemic attack)   . Full dentures   . Pulmonary nodules 08/11/2013    a. CT angio 08/2013: 4-70mm nodules in RUL/RML, f/u recommended 6 months.  . Migraine   . Fibromyalgia   . Stroke   . Protein calorie malnutrition   . First degree AV block   . On home O2    Past Surgical History  Procedure Laterality Date  . Abdominal hysterectomy  1971  . Back surgery      X3  . Colonoscopy  June 2002    Dr. Ferdinand Lango: severe diverticular disease with haustral hypertrophy, primary and secondary diverticulosis, persistent spasticity, early stenosis  . Colonoscopy with propofol N/A 06/21/2013    Dr. Oneida Alar: sigmoid colon and descending colon with diverticula, small internal hemorrhoids  . Tonsillectomy    . Appendectomy    . Eye surgery      both cataracts-lenses  . Nasal sinus surgery Right 07/11/2013    Procedure: RIGHT ENDOSCOPIC ANTERIOR ETHMOIDECTOMY/RIGHT ENDOSCOPIC MAXILLARY  ANTROSTOMY WITH REMOVAL OF TISSUE;  Surgeon: Ascencion Dike, MD;  Location: [place];  Service: ENT;  Laterality: Right;  . Esophagogastroduodenoscopy N/A 03/06/2014    Procedure: ESOPHAGOGASTRODUODENOSCOPY (EGD);  Surgeon: Danie Binder, MD;  [facility];  Service: Endoscopy;  Laterality: N/A;  9;30  . Savory dilation N/A 03/06/2014    Procedure: SAVORY DILATION;  Surgeon: Danie Binder, MD;  [facility];  Service: Endoscopy;  Laterality: N/A;  . Esophageal biopsy  03/06/2014    Procedure: BIOPSY;  Surgeon: Danie Binder, MD;  [facility];  Service: Endoscopy;;  . Other surgical history      scar tissue removal x2  . Left heart catheterization with coronary angiogram N/A 12/21/2014    Procedure: LEFT HEART CATHETERIZATION WITH CORONARY ANGIOGRAM;  Surgeon: Belva Crome, MD;  Location: [facility] CATH LAB;  Service: Cardiovascular;  Laterality: N/A;   Family History  Problem Relation Age of Onset  . Colon cancer Neg Hx   . Breast cancer Mother   . Heart disease Mother   . Hypertension Mother   . Heart attack Mother   . Other Mother     varicose veins  . Cancer Mother   . Varicose Veins Mother   . Diabetes Brother   . Hypertension Brother   .  Heart disease Brother   . Throat cancer Father   . Cancer Father    History  Substance Use Topics  . Smoking status: Former Smoker -- 1.00 packs/day for 50 years    Types: Cigarettes    Quit date: 12/15/2014  . Smokeless tobacco: Not on file     Comment: smoked X 60 years  . Alcohol Use: No    Review of Systems ROS: Statement: All systems negative except as marked or noted in the HPI; Constitutional: Negative for objective fever and +chills, generalized weakness/fatigue. ; ; Eyes: Negative for eye pain, redness and discharge. ; ; ENMT: Negative for ear pain, hoarseness, nasal congestion, sinus pressure and sore throat. ; ; Cardiovascular: Negative for chest pain, palpitations, diaphoresis, and  peripheral edema. ; ; Respiratory: +cough, wheezing. Negative for wheezing and stridor. ; ; Gastrointestinal: +constipation. Negative for nausea, vomiting, diarrhea, abdominal pain, blood in stool, hematemesis, jaundice and rectal bleeding. . ; ; Genitourinary: Negative for dysuria, flank pain and hematuria. ; ; Musculoskeletal: Negative for back pain and neck pain. Negative for swelling and trauma.; ; Skin: Negative for pruritus, rash, abrasions, blisters, bruising and skin lesion.; ; Neuro: Negative for headache, lightheadedness and neck stiffness. Negative for weakness, altered level of consciousness , altered mental status, extremity weakness, paresthesias, involuntary movement, seizure and syncope.      Allergies  Codeine; Oxycodone; Talwin; and Valium  Home Medications   Prior to Admission medications   Medication Sig Start Date End Date Taking? Authorizing Provider  albuterol (PROVENTIL HFA;VENTOLIN HFA) 108 (90 BASE) MCG/ACT inhaler Inhale 2 puffs into the lungs every 4 (four) hours as needed for wheezing or shortness of breath. 08/12/13  Yes Rexene Alberts, MD  aspirin 81 MG chewable tablet Chew 1 tablet (81 mg total) by mouth daily. 12/28/14  Yes Reyne Dumas, MD  atorvastatin (LIPITOR) 80 MG tablet Take 1 tablet (80 mg total) by mouth daily at 6 PM. 01/10/15  Yes Imogene Burn, PA-C  budesonide-formoterol (SYMBICORT) 80-4.5 MCG/ACT inhaler Inhale 2 puffs into the lungs 2 (two) times daily. 12/28/14  Yes Reyne Dumas, MD  furosemide (LASIX) 40 MG tablet Take 1 tablet (40 mg total) by mouth daily. 01/10/15  Yes Imogene Burn, PA-C  gabapentin (NEURONTIN) 300 MG capsule Take 3 capsules (900 mg total) by mouth 3 (three) times daily. 11/09/14  Yes Melvenia Beam, MD  HYDROcodone-acetaminophen (NORCO) 10-325 MG per tablet Take 1 tablet by mouth every 6 (six) hours as needed for moderate pain or severe pain.   Yes Historical Provider, MD  ipratropium-albuterol (DUONEB) 0.5-2.5 (3) MG/3ML SOLN  Take 3 mLs by nebulization 3 (three) times daily. 08/12/13  Yes Rexene Alberts, MD  potassium chloride (K-DUR) 10 MEQ tablet Take 2 tablets (20 mEq total) by mouth daily. 01/10/15  Yes Imogene Burn, PA-C   BP 95/51 mmHg  Pulse 87  Temp(Src) 98.5 F (36.9 C) (Oral)  Resp 14  Ht 5\' 3"  (1.6 m)  Wt 112 lb (50.803 kg)  BMI 19.84 kg/m2  SpO2 98%   Filed Vitals:   02/14/15 1130 02/14/15 1200 02/14/15 1230 02/14/15 1300  BP: 104/50 107/57 92/51 95/51   Pulse: 94 95 89 87  Temp:      TempSrc:      Resp: 12  14 14   Height:      Weight:      SpO2: 92% 97% 99% 98%   10:08:30 Orthostatic Vital Signs JC  Orthostatic Lying  - BP- Lying: 102/64 mmHg ;  Pulse- Lying: 98  Orthostatic Sitting - BP- Sitting: 111/58 mmHg ; Pulse- Sitting: 104  Orthostatic Standing at 0 minutes - BP- Standing at 0 minutes: 99/64 mmHg ; Pulse- Standing at 0 minutes: 108      Physical Exam  1035: Physical examination:  Nursing notes reviewed; Vital signs and O2 SAT reviewed;  Constitutional: Well developed, Well nourished, In no acute distress; Head:  Normocephalic, atraumatic; Eyes: EOMI, PERRL, No scleral icterus; ENMT: Mouth and pharynx normal, Mucous membranes dry; Neck: Supple, Full range of motion, No lymphadenopathy; Cardiovascular: Regular rate and rhythm, No gallop; Respiratory: Breath sounds coarse & equal bilaterally, scattered wheezes.  Speaking full sentences with ease, Normal respiratory effort/excursion; Chest: Nontender, Movement normal; Abdomen: Soft, Nontender, Nondistended, Normal bowel sounds; Genitourinary: No CVA tenderness; Extremities: Pulses normal, No tenderness, No edema, No calf edema or asymmetry.; Neuro: AA&Ox3, Major CN grossly intact.  Speech clear. No gross focal motor or sensory deficits in extremities.; Skin: Color normal, Warm, Dry.   ED Course  Procedures     EKG Interpretation   Date/Time:  Wednesday February 14 2015 09:26:11 EDT Ventricular Rate:  100 PR Interval:  209 QRS  Duration: 111 QT Interval:  383 QTC Calculation: 494 R Axis:   91 Text Interpretation:  Sinus tachycardia with 1st degree A-V block Biatrial  enlargement Right axis deviation Anteroseptal infarct, old Baseline wander  When compared with ECG of 12/25/2014 Rate faster Confirmed by [facility]  MD,  Nunzio Cory [phone]) on 02/14/2015 10:54:47 AM      MDM  MDM Reviewed: previous chart, nursing note and vitals Reviewed previous: labs and ECG Interpretation: labs, x-ray and ECG      Results for orders placed or performed during the hospital encounter of 02/14/15  Urine culture  Result Value Ref Range   Specimen Description URINE, CLEAN CATCH    Special Requests NONE    Culture PENDING    Report Status PENDING   CBC with Differential  Result Value Ref Range   WBC 15.0 (H) 4.0 - 10.5 K/uL   RBC 4.08 3.87 - 5.11 MIL/uL   Hemoglobin 12.8 12.0 - 15.0 g/dL   HCT 40.1 36.0 - 46.0 %   MCV 98.3 78.0 - 100.0 fL   MCH 31.4 26.0 - 34.0 pg   MCHC 31.9 30.0 - 36.0 g/dL   RDW 13.7 11.5 - 15.5 %   Platelets 218 150 - 400 K/uL   Neutrophils Relative % 76 43 - 77 %   Neutro Abs 11.5 (H) 1.7 - 7.7 K/uL   Lymphocytes Relative 11 (L) 12 - 46 %   Lymphs Abs 1.6 0.7 - 4.0 K/uL   Monocytes Relative 13 (H) 3 - 12 %   Monocytes Absolute 1.9 (H) 0.1 - 1.0 K/uL   Eosinophils Relative 0 0 - 5 %   Eosinophils Absolute 0.0 0.0 - 0.7 K/uL   Basophils Relative 0 0 - 1 %   Basophils Absolute 0.1 0.0 - 0.1 K/uL  Comprehensive metabolic panel  Result Value Ref Range   Sodium 137 135 - 145 mmol/L   Potassium 3.8 3.5 - 5.1 mmol/L   Chloride 96 (L) 101 - 111 mmol/L   CO2 33 (H) 22 - 32 mmol/L   Glucose, Bld 122 (H) 65 - 99 mg/dL   BUN 25 (H) 6 - 20 mg/dL   Creatinine, Ser 0.87 0.44 - 1.00 mg/dL   Calcium 9.0 8.9 - 10.3 mg/dL   Total Protein 6.8 6.5 - 8.1 g/dL   Albumin 3.3 (  L) 3.5 - 5.0 g/dL   AST 19 15 - 41 U/L   ALT 11 (L) 14 - 54 U/L   Alkaline Phosphatase 97 38 - 126 U/L   Total Bilirubin 1.0 0.3 - 1.2  mg/dL   GFR calc non Af Amer >60 >60 mL/min   GFR calc Af Amer >60 >60 mL/min   Anion gap 8 5 - 15  Troponin I  Result Value Ref Range   Troponin I 0.03 <0.031 ng/mL  Lipase, blood  Result Value Ref Range   Lipase 23 22 - 51 U/L  Lactic acid, plasma  Result Value Ref Range   Lactic Acid, Venous 0.9 0.5 - 2.0 mmol/L  Urinalysis, Routine w reflex microscopic (not at [facility])  Result Value Ref Range   Color, Urine YELLOW YELLOW   APPearance CLEAR CLEAR   Specific Gravity, Urine >1.030 (H) 1.005 - 1.030   pH 5.5 5.0 - 8.0   Glucose, UA NEGATIVE NEGATIVE mg/dL   Hgb urine dipstick NEGATIVE NEGATIVE   Bilirubin Urine NEGATIVE NEGATIVE   Ketones, ur 15 (A) NEGATIVE mg/dL   Protein, ur 100 (A) NEGATIVE mg/dL   Urobilinogen, UA 1.0 0.0 - 1.0 mg/dL   Nitrite NEGATIVE NEGATIVE   Leukocytes, UA NEGATIVE NEGATIVE  Urine microscopic-add on  Result Value Ref Range   Squamous Epithelial / LPF MANY (A) RARE   WBC, UA 3-6 <3 WBC/hpf   RBC / HPF 3-6 <3 RBC/hpf   Bacteria, UA MANY (A) RARE  Brain natriuretic peptide  Result Value Ref Range   B Natriuretic Peptide 69.0 0.0 - 100.0 pg/mL   Dg Chest Portable 1 View 02/14/2015   CLINICAL DATA:  Productive cough, congestion, shortness of breath for 1 week  EXAM: PORTABLE CHEST - 1 VIEW  COMPARISON:  12/26/2014  FINDINGS: Cardiomediastinal silhouette is stable. No acute infiltrate or pleural effusion. No pulmonary edema. Mild hyperinflation.  IMPRESSION: No active disease.  Mild hyperinflation.   Electronically Signed   By: Lahoma Crocker M.D.   On: 02/14/2015 09:50    1245:  Pt received 2 neb tx and IV solumedrol. Pt ambulated: without O2 N/C pt's Sats dropped to 77%; when placed on 3L O2 N/C her Sats still dropped to 87%. Pt told ED staff "that was normal for her." Pt denies any symptoms during orthostatic VS. IVF bolus given for SBP 90-100's. EPIC chart reviewed: these SBP pt's baseline. T/C to [facility]. Roderic Palau, case discussed, including:  HPI, pertinent  PM/SHx, VS/PE, dx testing, ED course and treatment:  Agreeable to admit, requests to write temporary orders, obtain observation medical bed to team 1.   Francine Graven, DO 02/15/15 1528

## 2015-02-14 NOTE — ED Notes (Addendum)
PT went to PCP this am and was sent to ED d/t generalized weakness, headache, body aches, productive green sputum cough and hypoxia. PT states wears 02 at 2L prn at home. PT was 68% of room air upon arrival to ED. PT also c/o constipation and states only one small BM in the past 7 days.

## 2015-02-14 NOTE — ED Notes (Signed)
Pt states she feels ok standing up with orthostatics.  ra sat 95%.  Pt ambulated to bathroom without difficulty.  No distress.   Pt back in bed after ambulating to the bathroom and RA sat 77 %.  Placed back on [place] o2 at 2 lpm.

## 2015-02-15 DIAGNOSIS — J449 Chronic obstructive pulmonary disease, unspecified: Secondary | ICD-10-CM

## 2015-02-15 DIAGNOSIS — I5032 Chronic diastolic (congestive) heart failure: Secondary | ICD-10-CM | POA: Diagnosis not present

## 2015-02-15 DIAGNOSIS — J9621 Acute and chronic respiratory failure with hypoxia: Secondary | ICD-10-CM | POA: Diagnosis not present

## 2015-02-15 DIAGNOSIS — I10 Essential (primary) hypertension: Secondary | ICD-10-CM | POA: Diagnosis not present

## 2015-02-15 LAB — CBC
HCT: 34.3 % — ABNORMAL LOW (ref 36.0–46.0)
Hemoglobin: 10.6 g/dL — ABNORMAL LOW (ref 12.0–15.0)
MCH: 30.4 pg (ref 26.0–34.0)
MCHC: 30.9 g/dL (ref 30.0–36.0)
MCV: 98.3 fL (ref 78.0–100.0)
Platelets: 218 10*3/uL (ref 150–400)
RBC: 3.49 MIL/uL — ABNORMAL LOW (ref 3.87–5.11)
RDW: 13.7 % (ref 11.5–15.5)
WBC: 12.7 10*3/uL — ABNORMAL HIGH (ref 4.0–10.5)

## 2015-02-15 LAB — FOLATE RBC
Folate, Hemolysate: 426.8 ng/mL
Folate, RBC: 1199 ng/mL (ref >498)
Hematocrit: 35.6 % (ref 34.0–46.6)

## 2015-02-15 LAB — BASIC METABOLIC PANEL
Anion gap: 8 (ref 5–15)
BUN: 22 mg/dL — ABNORMAL HIGH (ref 6–20)
CO2: 33 mmol/L — ABNORMAL HIGH (ref 22–32)
Calcium: 8.8 mg/dL — ABNORMAL LOW (ref 8.9–10.3)
Chloride: 99 mmol/L — ABNORMAL LOW (ref 101–111)
Creatinine, Ser: 0.71 mg/dL (ref 0.44–1.00)
GFR calc Af Amer: >60 mL/min (ref >60)
GFR calc non Af Amer: >60 mL/min (ref >60)
Glucose, Bld: 225 mg/dL — ABNORMAL HIGH (ref 65–99)
Potassium: 3.8 mmol/L (ref 3.5–5.1)
Sodium: 140 mmol/L (ref 135–145)

## 2015-02-15 MED ORDER — LINACLOTIDE 145 MCG PO CAPS
145.0000 ug | ORAL_CAPSULE | Freq: Every day | ORAL | Status: DC
  Administered 2015-02-15: 145 ug via ORAL
  Filled 2015-02-15: qty 1

## 2015-02-15 MED ORDER — LINACLOTIDE 145 MCG PO CAPS: 145.0000 ug | ORAL_CAPSULE | Freq: Every day | ORAL | Status: DC

## 2015-02-15 MED ORDER — MILK AND MOLASSES ENEMA
1.0000 | Freq: Once | RECTAL | Status: AC
  Administered 2015-02-15: 250 mL via RECTAL

## 2015-02-15 NOTE — Progress Notes (Addendum)
Patient's O2 saturation on room air 87-90%, ambulating on room air 82% and patient without distress, oxygen at 2L/M via [place] applied, saturation increased to 92% while ambulating, resting oxygen saturation with oxygen 95%, patient encouraged to wear oxygen at all times at home.  Janan Ridge, RN, MSN, [facility]

## 2015-02-15 NOTE — Discharge Summary (Signed)
Physician Discharge Summary  Lindsay Mitchell:[id number] DOB: [birth year] DOA: 02/14/2015  PCP: Lindsay Herring., MD  Admit date: 02/14/2015 Discharge date: 02/15/2015  Time spent: 40 minutes  Recommendations for Outpatient Follow-up:  1. Follow up with PCP 1-2 weeks for evaluation of respiratory status and track TSH 2. Follow up with GI 1-2 weeks   Discharge Diagnoses:  Principal Problem:   Acute on chronic respiratory failure Active Problems:   Constipation   COPD (chronic obstructive pulmonary disease)   Tobacco abuse   Protein-calorie malnutrition, severe   Hypoxia   Essential hypertension   Chronic diastolic heart failure   Discharge Condition: stable  Diet recommendation: heart healthy  Filed Weights   02/14/15 0924 02/14/15 1433 02/15/15 0545  Weight: 50.803 kg (112 lb) 49.079 kg (108 lb 3.2 oz) 49.941 kg (110 lb 1.6 oz)    History of present illness:  Lindsay Mitchell is a 74 y.o. female past medical history that includes chronic diastolic heart failure, chronic respiratory failure related to COPD, CAD recent diverticular abscess, or myalgia, hypotension, recent non-ST elevated myocardial infarction presented to emergency Department chief complaint generalized weakness. Initial evaluation revealed acute on chronic respiratory failure with oxygen saturation dropping to 87% on her usual 3 L of oxygen.  Patient reported one day history of generalized weakness/fatigue. She went to see her primary care provider and reportedly was hypoxic ann referred to the emergency department. Associated symptoms included moist productive cough and constipation. She reported that recently her Lasix dose was decreased from 40 mg to 20 mg. She denied chest pain palpitation headache visual disturbances numbness tingling of extremities. She denied abdominal pain nausea vomiting diaphoresis.  Workup in the emergency department included complete blood count significant for leukocytosis of 15.0,  chloride 96, CO2 33 BUN 25 serum glucose 122. chest x-ray with no active disease and mild hyperinflation. No acute infiltrate or pleural effusion. Urinalysis with many squama cells and many bacteria 3-6 WBCs. While in the emergency department blood pressure was on the soft side which according to chart review is her baseline she was afebrile and oxygen saturation level 91% on 2-1/2 L nasal cannula. She was given 500 mils of normal saline Solu-Medrol and a breathing treatment.   Hospital Course:  Acute on chronic respiratory failure with hypoxia: Mild in the setting of acute on chronic diastolic heart failure related to decrease in Lasix dose as well as the COPD. She received nebs and Solu-Medrol in the emergency department and oxygen saturation level very close to baseline on admission. Continue nebulizers. chest x-ray without infiltrate or edema. Of note, she is non-compliant with oxygen use at home.  Active Problems: Chronic diastolic heart failure; did not appear particularly overloaded on admission. Her BNP within the limits of normal. Lactic acid within the limits of normal chest x-ray as noted above. She did mention the last week her Lasix dose was decreased from 40-20mg  due to dropping BP with standing.  Lasix held for one dose as she looked slightly dry. Echos with  EF of 65% with normal wall motion.  Generalized weakness.: Likely related to #1 and #2 in the setting of chronic generalized weakness related to fibromyalgia chronic pain syndrome, narcotics and protein calorie malnutrition. TSH 0.341 just below low end of normal. Will need OP follow up.  vitamin B 12 and folate level with in limits of normal.    Constipation: Reports no bowel movement for 5 days. Also perceives constipation being the beginning of many medical issues. Abdominal film  with moderate stool burden throughout. Likely related to pain meds. Provided milk and molassses enema x2 with only fair results. Resume linzess. Follow up  with Lindsay Mitchell as patient requested to change Gastroenterologist. i spoke to Lindsay Mitchell who agreed. Left message with Lindsay Mitchell office to call patient.   COPD (chronic obstructive pulmonary disease): On 3 L of oxygen at home. She was given Solu-Medrol in the emergency department. Did not appear to be in exacerbation. See #1   Tobacco abuse: Cessation counseling offered   Protein-calorie malnutrition, severe: BMI 19.9. Nutritional supplements  Procedures:  none  Consultations:  none  Discharge Exam: Filed Vitals:   02/15/15 1455  BP: 99/57  Pulse: 88  Temp: 98 F (36.7 C)  Resp: 18    General: well nouished appear chronically ill Cardiovascular: rrr no m/g/r no Le edema Respiratory: normal effort BS distant but clear Abdomen: non-distended non-tender BS  Discharge Instructions   Discharge Instructions    Diet - low sodium heart healthy    Complete by:  As directed      Discharge instructions    Complete by:  As directed   Follow up with Lindsay Mitchell in 1-2 weeks     Increase activity slowly    Complete by:  As directed           Current Discharge Medication List    START taking these medications   Details  Linaclotide (LINZESS) 145 MCG CAPS capsule Take 1 capsule (145 mcg total) by mouth daily. Qty: 30 capsule, Refills: 0      CONTINUE these medications which have NOT CHANGED   Details  albuterol (PROVENTIL HFA;VENTOLIN HFA) 108 (90 BASE) MCG/ACT inhaler Inhale 2 puffs into the lungs every 4 (four) hours as needed for wheezing or shortness of breath. Qty: 1 Inhaler, Refills: 12    aspirin 81 MG chewable tablet Chew 1 tablet (81 mg total) by mouth daily. Qty: 30 tablet, Refills: 1    atorvastatin (LIPITOR) 80 MG tablet Take 1 tablet (80 mg total) by mouth daily at 6 PM. Qty: 30 tablet, Refills: 3    budesonide-formoterol (SYMBICORT) 80-4.5 MCG/ACT inhaler Inhale 2 puffs into the lungs 2 (two) times daily. Qty: 1 Inhaler, Refills: 12    furosemide  (LASIX) 40 MG tablet Take 1 tablet (40 mg total) by mouth daily. Qty: 30 tablet, Refills: 3    gabapentin (NEURONTIN) 300 MG capsule Take 3 capsules (900 mg total) by mouth 3 (three) times daily. Qty: 270 capsule, Refills: 6   Associated Diagnoses: Trigeminal neuralgia    HYDROcodone-acetaminophen (NORCO) 10-325 MG per tablet Take 1 tablet by mouth every 6 (six) hours as needed for moderate pain or severe pain.    ipratropium-albuterol (DUONEB) 0.5-2.5 (3) MG/3ML SOLN Take 3 mLs by nebulization 3 (three) times daily.    potassium chloride (K-DUR) 10 MEQ tablet Take 2 tablets (20 mEq total) by mouth daily. Qty: 60 tablet, Refills: 3    saccharomyces boulardii (FLORASTOR) 250 MG capsule Take 250 mg by mouth 2 (two) times daily.       Allergies  Allergen Reactions  . Codeine Other (See Comments)    Makes patient not in right state of mind.   . Oxycodone Itching  . Talwin [Pentazocine] Other (See Comments)    Makes patient not in right state of mind.   . Valium [Diazepam] Other (See Comments)    Just doesn't work.       The results of significant diagnostics from this hospitalization (  including imaging, microbiology, ancillary and laboratory) are listed below for reference.    Significant Diagnostic Studies: Dg Chest Portable 1 View  02/14/2015   CLINICAL DATA:  Productive cough, congestion, shortness of breath for 1 week  EXAM: PORTABLE CHEST - 1 VIEW  COMPARISON:  12/26/2014  FINDINGS: Cardiomediastinal silhouette is stable. No acute infiltrate or pleural effusion. No pulmonary edema. Mild hyperinflation.  IMPRESSION: No active disease.  Mild hyperinflation.   Electronically Signed   By: Lahoma Crocker M.D.   On: 02/14/2015 09:50   Dg Abd Portable 1v  02/14/2015   CLINICAL DATA:  Abdominal pain and constipation for 8 days.  EXAM: PORTABLE ABDOMEN - 1 VIEW  COMPARISON:  CT scan 09/19/2014  FINDINGS: Mild to moderate stool throughout the colon and down into the rectum may suggest  constipation. There are also nondistended air-filled loops of small bowel. The soft tissue shadows are maintained. No free air. The bony structures are intact.  IMPRESSION: Probable mild ileus and mild constipation.   Electronically Signed   By: Marijo Sanes M.D.   On: 02/14/2015 16:31    Microbiology: Recent Results (from the past 240 hour(s))  Urine culture     Status: None   Collection Time: 02/14/15 10:10 AM  Result Value Ref Range Status   Specimen Description URINE, CLEAN CATCH  Final   Special Requests NONE  Final   Culture   Final    30,000 COLONIES/ml GRAM POSITIVE RODS 2,000 COLONIES/mL GRAM POSITIVE COCCI INSIGNIFICANT GROWTH Performed at [facility]    Report Status 02/15/2015 FINAL  Final     Labs: Basic Metabolic Panel:  Recent Labs Lab 02/14/15 0940 02/15/15 0632  NA 137 140  K 3.8 3.8  CL 96* 99*  CO2 33* 33*  GLUCOSE 122* 225*  BUN 25* 22*  CREATININE 0.87 0.71  CALCIUM 9.0 8.8*   Liver Function Tests:  Recent Labs Lab 02/14/15 0940  AST 19  ALT 11*  ALKPHOS 97  BILITOT 1.0  PROT 6.8  ALBUMIN 3.3*    Recent Labs Lab 02/14/15 0940  LIPASE 23   No results for input(s): AMMONIA in the last 168 hours. CBC:  Recent Labs Lab 02/14/15 0940 02/14/15 1408 02/15/15 0632  WBC 15.0*  --  12.7*  NEUTROABS 11.5*  --   --   HGB 12.8  --  10.6*  HCT 40.1 35.6 34.3*  MCV 98.3  --  98.3  PLT 218  --  218   Cardiac Enzymes:  Recent Labs Lab 02/14/15 0940  TROPONINI 0.03   BNP: BNP (last 3 results)  Recent Labs  12/23/14 0515 02/14/15 0949  BNP 1234.6* 69.0    ProBNP (last 3 results) No results for input(s): PROBNP in the last 8760 hours.  CBG: No results for input(s): GLUCAP in the last 168 hours.     SignedRadene Gunning  [facility] 02/15/2015, 4:26 PM

## 2015-02-15 NOTE — Progress Notes (Signed)
Lindsay Mitchell discharged home with husband per MD order.  Discharge instructions reviewed and discussed with the patient and husband at bedside, all questions and concerns answered. Copy of instructions and scripts given to patient.    Medication List    TAKE these medications        albuterol 108 (90 BASE) MCG/ACT inhaler  Commonly known as:  PROVENTIL HFA;VENTOLIN HFA  Inhale 2 puffs into the lungs every 4 (four) hours as needed for wheezing or shortness of breath.     aspirin 81 MG chewable tablet  Chew 1 tablet (81 mg total) by mouth daily.     atorvastatin 80 MG tablet  Commonly known as:  LIPITOR  Take 1 tablet (80 mg total) by mouth daily at 6 PM.     budesonide-formoterol 80-4.5 MCG/ACT inhaler  Commonly known as:  SYMBICORT  Inhale 2 puffs into the lungs 2 (two) times daily.     furosemide 40 MG tablet  Commonly known as:  LASIX  Take 1 tablet (40 mg total) by mouth daily.     gabapentin 300 MG capsule  Commonly known as:  NEURONTIN  Take 3 capsules (900 mg total) by mouth 3 (three) times daily.     HYDROcodone-acetaminophen 10-325 MG per tablet  Commonly known as:  NORCO  Take 1 tablet by mouth every 6 (six) hours as needed for moderate pain or severe pain.     ipratropium-albuterol 0.5-2.5 (3) MG/3ML Soln  Commonly known as:  DUONEB  Take 3 mLs by nebulization 3 (three) times daily.     Linaclotide 145 MCG Caps capsule  Commonly known as:  LINZESS  Take 1 capsule (145 mcg total) by mouth daily.     potassium chloride 10 MEQ tablet  Commonly known as:  K-DUR  Take 2 tablets (20 mEq total) by mouth daily.     saccharomyces boulardii 250 MG capsule  Commonly known as:  FLORASTOR  Take 250 mg by mouth 2 (two) times daily.        Patients skin is clean, dry and intact, no evidence of skin break down. IV site discontinued and catheter remains intact. Site without signs and symptoms of complications. Dressing and pressure applied.  Patient escorted to car  by Estill Bamberg, NT in a wheelchair,  no distress noted upon discharge.  [name] 02/15/2015 5:38 PM

## 2015-02-15 NOTE — Care Management Note (Signed)
Case Management Note  Patient Details  Name: Lindsay Mitchell MRN: [id number] Date of Birth: [birth year]  Subjective/Objective:                  Pt admitted from home with respiratory failure. Pt lives with family and will return home at discharge. Pt is independent with ADL's. Pt has home O2 with [employer] and a neb machine. Pt stated that she only wears her home O2 as needed but has portables and concentrator at home.  Action/Plan: Pt for discharge today. No Cm needs noted.   Expected Discharge Date:                  Expected Discharge Plan:  Home/Self Care  In-House Referral:  NA  Discharge planning Services  CM Consult  Post Acute Care Choice:  NA Choice offered to:  NA  DME Arranged:    DME Agency:     HH Arranged:    HH Agency:     Status of Service:  Completed, signed off  Medicare Important Message Given:    Date Medicare IM Given:    Medicare IM give by:    Date Additional Medicare IM Given:    Additional Medicare Important Message give by:     If discussed at [place] of Stay Meetings, dates discussed:    Additional Comments:  Joylene Draft, RN 02/15/2015, 3:12 PM

## 2015-02-18 LAB — URINE CULTURE: Culture: 30000

## 2015-02-26 ENCOUNTER — Ambulatory Visit (INDEPENDENT_AMBULATORY_CARE_PROVIDER_SITE_OTHER): Payer: Medicare Other | Admitting: Internal Medicine

## 2015-02-27 DIAGNOSIS — J449 Chronic obstructive pulmonary disease, unspecified: Secondary | ICD-10-CM | POA: Diagnosis not present

## 2015-02-28 ENCOUNTER — Ambulatory Visit (INDEPENDENT_AMBULATORY_CARE_PROVIDER_SITE_OTHER): Payer: Medicare Other | Admitting: Internal Medicine

## 2015-02-28 ENCOUNTER — Encounter (HOSPITAL_COMMUNITY): Payer: Self-pay

## 2015-02-28 ENCOUNTER — Emergency Department (HOSPITAL_COMMUNITY): Payer: Medicare Other

## 2015-02-28 ENCOUNTER — Inpatient Hospital Stay (HOSPITAL_COMMUNITY)
Admission: EM | Admit: 2015-02-28 | Discharge: 2015-03-08 | DRG: 391 | Disposition: A | Discharge status: Home Health | Payer: Medicare Other | Attending: Internal Medicine | Admitting: Internal Medicine

## 2015-02-28 ENCOUNTER — Encounter (INDEPENDENT_AMBULATORY_CARE_PROVIDER_SITE_OTHER): Payer: Self-pay | Admitting: Internal Medicine

## 2015-02-28 VITALS — BP 80/60 | HR 64 | Temp 97.8°F | Ht 63.0 in | Wt 103.0 lb

## 2015-02-28 DIAGNOSIS — Z803 Family history of malignant neoplasm of breast: Secondary | ICD-10-CM | POA: Diagnosis not present

## 2015-02-28 DIAGNOSIS — D649 Anemia, unspecified: Secondary | ICD-10-CM | POA: Diagnosis not present

## 2015-02-28 DIAGNOSIS — R1012 Left upper quadrant pain: Secondary | ICD-10-CM | POA: Diagnosis not present

## 2015-02-28 DIAGNOSIS — R627 Adult failure to thrive: Secondary | ICD-10-CM | POA: Diagnosis not present

## 2015-02-28 DIAGNOSIS — J9611 Chronic respiratory failure with hypoxia: Secondary | ICD-10-CM | POA: Diagnosis present

## 2015-02-28 DIAGNOSIS — F418 Other specified anxiety disorders: Secondary | ICD-10-CM | POA: Diagnosis present

## 2015-02-28 DIAGNOSIS — E785 Hyperlipidemia, unspecified: Secondary | ICD-10-CM | POA: Diagnosis not present

## 2015-02-28 DIAGNOSIS — K630 Abscess of intestine: Secondary | ICD-10-CM

## 2015-02-28 DIAGNOSIS — I252 Old myocardial infarction: Secondary | ICD-10-CM | POA: Diagnosis not present

## 2015-02-28 DIAGNOSIS — E2740 Unspecified adrenocortical insufficiency: Secondary | ICD-10-CM | POA: Diagnosis present

## 2015-02-28 DIAGNOSIS — Z7982 Long term (current) use of aspirin: Secondary | ICD-10-CM

## 2015-02-28 DIAGNOSIS — K5700 Diverticulitis of small intestine with perforation and abscess without bleeding: Secondary | ICD-10-CM | POA: Diagnosis not present

## 2015-02-28 DIAGNOSIS — E43 Unspecified severe protein-calorie malnutrition: Secondary | ICD-10-CM | POA: Diagnosis not present

## 2015-02-28 DIAGNOSIS — R9431 Abnormal electrocardiogram [ECG] [EKG]: Secondary | ICD-10-CM | POA: Diagnosis not present

## 2015-02-28 DIAGNOSIS — Z9981 Dependence on supplemental oxygen: Secondary | ICD-10-CM

## 2015-02-28 DIAGNOSIS — I9589 Other hypotension: Secondary | ICD-10-CM | POA: Diagnosis not present

## 2015-02-28 DIAGNOSIS — Z833 Family history of diabetes mellitus: Secondary | ICD-10-CM

## 2015-02-28 DIAGNOSIS — R600 Localized edema: Secondary | ICD-10-CM | POA: Diagnosis present

## 2015-02-28 DIAGNOSIS — K5720 Diverticulitis of large intestine with perforation and abscess without bleeding: Principal | ICD-10-CM | POA: Diagnosis present

## 2015-02-28 DIAGNOSIS — K5730 Diverticulosis of large intestine without perforation or abscess without bleeding: Secondary | ICD-10-CM | POA: Diagnosis not present

## 2015-02-28 DIAGNOSIS — D6489 Other specified anemias: Secondary | ICD-10-CM | POA: Diagnosis not present

## 2015-02-28 DIAGNOSIS — Z8249 Family history of ischemic heart disease and other diseases of the circulatory system: Secondary | ICD-10-CM | POA: Diagnosis not present

## 2015-02-28 DIAGNOSIS — I959 Hypotension, unspecified: Secondary | ICD-10-CM | POA: Diagnosis not present

## 2015-02-28 DIAGNOSIS — Z681 Body mass index (BMI) 19 or less, adult: Secondary | ICD-10-CM | POA: Diagnosis not present

## 2015-02-28 DIAGNOSIS — K5732 Diverticulitis of large intestine without perforation or abscess without bleeding: Secondary | ICD-10-CM | POA: Diagnosis not present

## 2015-02-28 DIAGNOSIS — Z87891 Personal history of nicotine dependence: Secondary | ICD-10-CM | POA: Diagnosis not present

## 2015-02-28 DIAGNOSIS — K5780 Diverticulitis of intestine, part unspecified, with perforation and abscess without bleeding: Secondary | ICD-10-CM | POA: Diagnosis not present

## 2015-02-28 DIAGNOSIS — Z8673 Personal history of transient ischemic attack (TIA), and cerebral infarction without residual deficits: Secondary | ICD-10-CM | POA: Diagnosis not present

## 2015-02-28 DIAGNOSIS — I951 Orthostatic hypotension: Secondary | ICD-10-CM | POA: Diagnosis not present

## 2015-02-28 DIAGNOSIS — J410 Simple chronic bronchitis: Secondary | ICD-10-CM

## 2015-02-28 DIAGNOSIS — J449 Chronic obstructive pulmonary disease, unspecified: Secondary | ICD-10-CM | POA: Diagnosis not present

## 2015-02-28 DIAGNOSIS — Z808 Family history of malignant neoplasm of other organs or systems: Secondary | ICD-10-CM | POA: Diagnosis not present

## 2015-02-28 DIAGNOSIS — K219 Gastro-esophageal reflux disease without esophagitis: Secondary | ICD-10-CM | POA: Diagnosis not present

## 2015-02-28 DIAGNOSIS — K5900 Constipation, unspecified: Secondary | ICD-10-CM | POA: Diagnosis present

## 2015-02-28 DIAGNOSIS — E876 Hypokalemia: Secondary | ICD-10-CM | POA: Diagnosis present

## 2015-02-28 DIAGNOSIS — G8929 Other chronic pain: Secondary | ICD-10-CM | POA: Diagnosis present

## 2015-02-28 DIAGNOSIS — R112 Nausea with vomiting, unspecified: Secondary | ICD-10-CM | POA: Diagnosis not present

## 2015-02-28 DIAGNOSIS — R109 Unspecified abdominal pain: Secondary | ICD-10-CM | POA: Diagnosis present

## 2015-02-28 DIAGNOSIS — K5909 Other constipation: Secondary | ICD-10-CM | POA: Diagnosis not present

## 2015-02-28 DIAGNOSIS — M797 Fibromyalgia: Secondary | ICD-10-CM | POA: Diagnosis present

## 2015-02-28 HISTORY — DX: Acute myocardial infarction, unspecified: I21.9

## 2015-02-28 LAB — COMPREHENSIVE METABOLIC PANEL
ALT: 9 U/L — ABNORMAL LOW (ref 14–54)
AST: 19 U/L (ref 15–41)
Albumin: 3.1 g/dL — ABNORMAL LOW (ref 3.5–5.0)
Alkaline Phosphatase: 80 U/L (ref 38–126)
Anion gap: 11 (ref 5–15)
BUN: 25 mg/dL — ABNORMAL HIGH (ref 6–20)
CO2: 34 mmol/L — ABNORMAL HIGH (ref 22–32)
Calcium: 9.2 mg/dL (ref 8.9–10.3)
Chloride: 94 mmol/L — ABNORMAL LOW (ref 101–111)
Creatinine, Ser: 0.86 mg/dL (ref 0.44–1.00)
GFR calc Af Amer: >60 mL/min (ref >60)
GFR calc non Af Amer: >60 mL/min (ref >60)
Glucose, Bld: 123 mg/dL — ABNORMAL HIGH (ref 65–99)
Potassium: 3.7 mmol/L (ref 3.5–5.1)
Sodium: 139 mmol/L (ref 135–145)
Total Bilirubin: 0.6 mg/dL (ref 0.3–1.2)
Total Protein: 7.3 g/dL (ref 6.5–8.1)

## 2015-02-28 LAB — URINALYSIS, ROUTINE W REFLEX MICROSCOPIC
Bilirubin Urine: NEGATIVE
Glucose, UA: NEGATIVE mg/dL
Hgb urine dipstick: NEGATIVE
Ketones, ur: NEGATIVE mg/dL
Leukocytes, UA: NEGATIVE
Nitrite: NEGATIVE
Protein, ur: NEGATIVE mg/dL
Specific Gravity, Urine: 1.015 (ref 1.005–1.030)
Urobilinogen, UA: 0.2 mg/dL (ref 0.0–1.0)
pH: 6 (ref 5.0–8.0)

## 2015-02-28 LAB — CBC WITH DIFFERENTIAL/PLATELET
Basophils Absolute: 0.1 10*3/uL (ref 0.0–0.1)
Basophils Relative: 1 % (ref 0–1)
Eosinophils Absolute: 0.2 10*3/uL (ref 0.0–0.7)
Eosinophils Relative: 1 % (ref 0–5)
HCT: 39.4 % (ref 36.0–46.0)
Hemoglobin: 12.6 g/dL (ref 12.0–15.0)
Lymphocytes Relative: 13 % (ref 12–46)
Lymphs Abs: 1.9 10*3/uL (ref 0.7–4.0)
MCH: 30.7 pg (ref 26.0–34.0)
MCHC: 32 g/dL (ref 30.0–36.0)
MCV: 96.1 fL (ref 78.0–100.0)
Monocytes Absolute: 1.1 10*3/uL — ABNORMAL HIGH (ref 0.1–1.0)
Monocytes Relative: 7 % (ref 3–12)
Neutro Abs: 11.7 10*3/uL — ABNORMAL HIGH (ref 1.7–7.7)
Neutrophils Relative %: 78 % — ABNORMAL HIGH (ref 43–77)
Platelets: 442 10*3/uL — ABNORMAL HIGH (ref 150–400)
RBC: 4.1 MIL/uL (ref 3.87–5.11)
RDW: 14.6 % (ref 11.5–15.5)
WBC: 14.9 10*3/uL — ABNORMAL HIGH (ref 4.0–10.5)

## 2015-02-28 LAB — TROPONIN I: Troponin I: <0.03 ng/mL (ref <0.031)

## 2015-02-28 LAB — LIPASE, BLOOD: Lipase: 24 U/L (ref 22–51)

## 2015-02-28 MED ORDER — ACETAMINOPHEN 325 MG PO TABS: 650.0000 mg | ORAL_TABLET | Freq: Four times a day (QID) | ORAL | Status: DC | PRN

## 2015-02-28 MED ORDER — IPRATROPIUM-ALBUTEROL 0.5-2.5 (3) MG/3ML IN SOLN
RESPIRATORY_TRACT | Status: AC
  Administered 2015-02-28: 3 mL
  Filled 2015-02-28: qty 3

## 2015-02-28 MED ORDER — ONDANSETRON HCL 4 MG/2ML IJ SOLN
4.0000 mg | Freq: Once | INTRAMUSCULAR | Status: AC
  Administered 2015-02-28: 4 mg via INTRAVENOUS

## 2015-02-28 MED ORDER — PIPERACILLIN-TAZOBACTAM 3.375 G IVPB
3.3750 g | Freq: Three times a day (TID) | INTRAVENOUS | Status: DC
  Administered 2015-02-28 – 2015-03-08 (×22): 3.375 g via INTRAVENOUS
  Filled 2015-02-28 (×26): qty 50

## 2015-02-28 MED ORDER — ONDANSETRON HCL 4 MG/2ML IJ SOLN
INTRAMUSCULAR | Status: AC
  Filled 2015-02-28: qty 2

## 2015-02-28 MED ORDER — SENNA 8.6 MG PO TABS
1.0000 | ORAL_TABLET | Freq: Two times a day (BID) | ORAL | Status: DC
  Administered 2015-02-28 – 2015-03-08 (×15): 8.6 mg via ORAL
  Filled 2015-02-28 (×15): qty 1

## 2015-02-28 MED ORDER — GABAPENTIN 300 MG PO CAPS
900.0000 mg | ORAL_CAPSULE | Freq: Three times a day (TID) | ORAL | Status: DC
Start: 2015-02-28 — End: 2015-03-08
  Administered 2015-02-28 – 2015-03-08 (×22): 900 mg via ORAL
  Filled 2015-02-28 (×22): qty 3

## 2015-02-28 MED ORDER — HEPARIN SODIUM (PORCINE) 5000 UNIT/ML IJ SOLN
5000.0000 [IU] | Freq: Three times a day (TID) | INTRAMUSCULAR | Status: DC
  Administered 2015-02-28 – 2015-03-06 (×17): 5000 [IU] via SUBCUTANEOUS
  Filled 2015-02-28 (×17): qty 1

## 2015-02-28 MED ORDER — IOHEXOL 300 MG/ML  SOLN
100.0000 mL | Freq: Once | INTRAMUSCULAR | Status: AC | PRN
  Administered 2015-02-28: 100 mL via INTRAVENOUS

## 2015-02-28 MED ORDER — SODIUM CHLORIDE 0.9 % IV SOLN
INTRAVENOUS | Status: DC
  Administered 2015-03-01 – 2015-03-08 (×7): via INTRAVENOUS

## 2015-02-28 MED ORDER — ACETAMINOPHEN 650 MG RE SUPP: 650.0000 mg | Freq: Four times a day (QID) | RECTAL | Status: DC | PRN

## 2015-02-28 MED ORDER — ONDANSETRON HCL 4 MG/2ML IJ SOLN: 4.0000 mg | Freq: Four times a day (QID) | INTRAMUSCULAR | Status: DC | PRN

## 2015-02-28 MED ORDER — IPRATROPIUM-ALBUTEROL 0.5-2.5 (3) MG/3ML IN SOLN: 3.0000 mL | Freq: Three times a day (TID) | RESPIRATORY_TRACT | Status: DC

## 2015-02-28 MED ORDER — BUDESONIDE-FORMOTEROL FUMARATE 80-4.5 MCG/ACT IN AERO
2.0000 | INHALATION_SPRAY | Freq: Two times a day (BID) | RESPIRATORY_TRACT | Status: DC
  Administered 2015-03-01 – 2015-03-08 (×15): 2 via RESPIRATORY_TRACT
  Filled 2015-02-28: qty 6.9

## 2015-02-28 MED ORDER — HYDROCODONE-ACETAMINOPHEN 10-325 MG PO TABS
1.0000 | ORAL_TABLET | Freq: Four times a day (QID) | ORAL | Status: DC | PRN
  Administered 2015-02-28 – 2015-03-07 (×16): 1 via ORAL
  Filled 2015-02-28 (×17): qty 1

## 2015-02-28 MED ORDER — IPRATROPIUM-ALBUTEROL 0.5-2.5 (3) MG/3ML IN SOLN
3.0000 mL | Freq: Three times a day (TID) | RESPIRATORY_TRACT | Status: DC
  Administered 2015-03-01: 3 mL via RESPIRATORY_TRACT
  Filled 2015-02-28: qty 3

## 2015-02-28 MED ORDER — ONDANSETRON HCL 4 MG PO TABS: 4.0000 mg | ORAL_TABLET | Freq: Four times a day (QID) | ORAL | Status: DC | PRN

## 2015-02-28 MED ORDER — POTASSIUM CHLORIDE CRYS ER 10 MEQ PO TBCR
20.0000 meq | EXTENDED_RELEASE_TABLET | Freq: Every day | ORAL | Status: DC
  Administered 2015-03-01 – 2015-03-08 (×7): 20 meq via ORAL
  Filled 2015-02-28 (×9): qty 2

## 2015-02-28 MED ORDER — POLYETHYLENE GLYCOL 3350 17 G PO PACK: 17.0000 g | PACK | Freq: Every day | ORAL | Status: DC | PRN

## 2015-02-28 MED ORDER — ASPIRIN 81 MG PO CHEW
81.0000 mg | CHEWABLE_TABLET | Freq: Every day | ORAL | Status: DC
  Administered 2015-03-01 – 2015-03-08 (×7): 81 mg via ORAL
  Filled 2015-02-28 (×7): qty 1

## 2015-02-28 MED ORDER — SACCHAROMYCES BOULARDII 250 MG PO CAPS
250.0000 mg | ORAL_CAPSULE | Freq: Two times a day (BID) | ORAL | Status: DC
  Administered 2015-02-28 – 2015-03-08 (×15): 250 mg via ORAL
  Filled 2015-02-28 (×15): qty 1

## 2015-02-28 MED ORDER — SODIUM CHLORIDE 0.9 % IV BOLUS (SEPSIS)
500.0000 mL | Freq: Once | INTRAVENOUS | Status: AC
  Administered 2015-02-28: 500 mL via INTRAVENOUS

## 2015-02-28 MED ORDER — SODIUM CHLORIDE 0.9 % IV SOLN
INTRAVENOUS | Status: AC
  Administered 2015-02-28: 19:00:00 via INTRAVENOUS

## 2015-02-28 MED ORDER — PANTOPRAZOLE SODIUM 40 MG PO TBEC
40.0000 mg | DELAYED_RELEASE_TABLET | Freq: Every day | ORAL | Status: DC
  Administered 2015-03-01 – 2015-03-08 (×7): 40 mg via ORAL
  Filled 2015-02-28 (×7): qty 1

## 2015-02-28 MED ORDER — FUROSEMIDE 40 MG PO TABS: 20.0000 mg | ORAL_TABLET | Freq: Every day | ORAL | Status: DC

## 2015-02-28 MED ORDER — LINACLOTIDE 145 MCG PO CAPS
145.0000 ug | ORAL_CAPSULE | Freq: Every day | ORAL | Status: DC
  Filled 2015-02-28: qty 1

## 2015-02-28 MED ORDER — ENSURE ENLIVE PO LIQD
237.0000 mL | Freq: Two times a day (BID) | ORAL | Status: DC
  Administered 2015-03-01 – 2015-03-08 (×4): 237 mL via ORAL

## 2015-02-28 MED ORDER — ATORVASTATIN CALCIUM 40 MG PO TABS
80.0000 mg | ORAL_TABLET | Freq: Every day | ORAL | Status: DC
  Administered 2015-02-28 – 2015-03-07 (×8): 80 mg via ORAL
  Filled 2015-02-28 (×8): qty 2

## 2015-02-28 NOTE — ED Provider Notes (Signed)
CSN: [id number]     Arrival date & time 02/28/15  1202 History   First MD Initiated Contact with Patient 02/28/15 1254     Chief Complaint  Patient presents with  . Abdominal Pain  . Hypotension     (Consider location/radiation/quality/duration/timing/severity/associated sxs/prior Treatment) Patient is a 74 y.o. female presenting with abdominal pain. The history is provided by the patient.  Abdominal Pain Associated symptoms: cough, fatigue, nausea, shortness of breath and vomiting   Associated symptoms: no chest pain and no diarrhea    patient presents with abdominal pain. Has had it somewhat for the last 2-3 months and was admitted a couple months ago for diverticulitis with abscess. Had a non-STEMI at that time and was transferred down to [facility]. Reviewing the notes look so she also has C. difficile positive. Second ultrasound showed some improvement in the diverticulitis and resolved abscess. She states her last month or 2 she has not been doing well. Has been eating very little. She is basically bedridden. Was sent in by GI today because she was found to be hypotensive with pressures in the 80s. Reportedly only eat a bite or 2 of which today. Has had nausea vomiting. No diarrhea at this time. No appetite. Diffuse severe abdominal pain. Pressures were in the 80 systolic. No change in urination. Not doing well at home. She's been having more constipation. No blood in the emesis or stools.  Past Medical History  Diagnosis Date  . Constipation   . Depression with anxiety   . Diverticulosis   . DVT (deep venous thrombosis)   . COPD (chronic obstructive pulmonary disease)     Emphysema radiographically  . Carotid artery disease     a. duplex 10/8464: RICA <59%, LICA 93-57%. Followed by [facility].  . Anxiety   . TIA (transient ischemic attack)   . Full dentures   . Pulmonary nodules 08/11/2013    a. CT angio 08/2013: 4-35mm nodules in RUL/RML, f/u recommended 6 months.  . Migraine   .  Fibromyalgia   . Stroke   . Protein calorie malnutrition   . First degree AV block   . On home O2   . Myocardial infarction    Past Surgical History  Procedure Laterality Date  . Abdominal hysterectomy  1971  . Back surgery      X3  . Colonoscopy  June 2002    Dr. Ferdinand Lango: severe diverticular disease with haustral hypertrophy, primary and secondary diverticulosis, persistent spasticity, early stenosis  . Colonoscopy with propofol N/A 06/21/2013    Dr. Oneida Alar: sigmoid colon and descending colon with diverticula, small internal hemorrhoids  . Tonsillectomy    . Appendectomy    . Eye surgery      both cataracts-lenses  . Nasal sinus surgery Right 07/11/2013    Procedure: RIGHT ENDOSCOPIC ANTERIOR ETHMOIDECTOMY/RIGHT ENDOSCOPIC MAXILLARY ANTROSTOMY WITH REMOVAL OF TISSUE;  Surgeon: Ascencion Dike, MD;  Location: [place];  Service: ENT;  Laterality: Right;  . Esophagogastroduodenoscopy N/A 03/06/2014    Procedure: ESOPHAGOGASTRODUODENOSCOPY (EGD);  Surgeon: Danie Binder, MD;  [facility];  Service: Endoscopy;  Laterality: N/A;  9;30  . Savory dilation N/A 03/06/2014    Procedure: SAVORY DILATION;  Surgeon: Danie Binder, MD;  [facility];  Service: Endoscopy;  Laterality: N/A;  . Esophageal biopsy  03/06/2014    Procedure: BIOPSY;  Surgeon: Danie Binder, MD;  Location[facility];  Service: Endoscopy;;  . Other surgical history  scar tissue removal x2  . Left heart catheterization with coronary angiogram N/A 12/21/2014    Procedure: LEFT HEART CATHETERIZATION WITH CORONARY ANGIOGRAM;  Surgeon: Belva Crome, MD;  Location: [facility] CATH LAB;  Service: Cardiovascular;  Laterality: N/A;   Family History  Problem Relation Age of Onset  . Colon cancer Neg Hx   . Breast cancer Mother   . Heart disease Mother   . Hypertension Mother   . Heart attack Mother   . Other Mother     varicose veins  . Cancer Mother   . Varicose Veins Mother   . Diabetes  Brother   . Hypertension Brother   . Heart disease Brother   . Throat cancer Father   . Cancer Father    History  Substance Use Topics  . Smoking status: Former Smoker -- 1.00 packs/day for 50 years    Types: Cigarettes    Quit date: 12/15/2014  . Smokeless tobacco: Not on file     Comment: smoked X 60 years  . Alcohol Use: No   OB History    Gravida Para Term Preterm AB TAB SAB Ectopic Multiple Living            4     Review of Systems  Constitutional: Positive for appetite change and fatigue. Negative for activity change.  Eyes: Negative for pain.  Respiratory: Positive for cough and shortness of breath. Negative for chest tightness.        Some white sputum production  Cardiovascular: Negative for chest pain and leg swelling.  Gastrointestinal: Positive for nausea, vomiting and abdominal pain. Negative for diarrhea.  Genitourinary: Negative for flank pain.  Musculoskeletal: Negative for back pain and neck stiffness.  Skin: Negative for rash.  Neurological: Positive for weakness. Negative for numbness and headaches.  Psychiatric/Behavioral: Negative for behavioral problems.      Allergies  Codeine; Oxycodone; Talwin; and Valium  Home Medications   Prior to Admission medications   Medication Sig Start Date End Date Taking? Authorizing Provider  aspirin 81 MG chewable tablet Chew 1 tablet (81 mg total) by mouth daily. 12/28/14  Yes Reyne Dumas, MD  atorvastatin (LIPITOR) 80 MG tablet Take 1 tablet (80 mg total) by mouth daily at 6 PM. 01/10/15  Yes Imogene Burn, PA-C  budesonide-formoterol (SYMBICORT) 80-4.5 MCG/ACT inhaler Inhale 2 puffs into the lungs 2 (two) times daily. 12/28/14  Yes Reyne Dumas, MD  furosemide (LASIX) 20 MG tablet Take 20 mg by mouth daily.   Yes Historical Provider, MD  gabapentin (NEURONTIN) 300 MG capsule Take 3 capsules (900 mg total) by mouth 3 (three) times daily. 11/09/14  Yes Melvenia Beam, MD  HYDROcodone-acetaminophen (NORCO) 10-325  MG per tablet Take 1 tablet by mouth every 6 (six) hours as needed for moderate pain or severe pain.   Yes Historical Provider, MD  ipratropium-albuterol (DUONEB) 0.5-2.5 (3) MG/3ML SOLN Take 3 mLs by nebulization 3 (three) times daily. 08/12/13  Yes Rexene Alberts, MD  Linaclotide [facility]) 145 MCG CAPS capsule Take 1 capsule (145 mcg total) by mouth daily. 02/15/15  Yes Lezlie Octave Black, NP  omeprazole (PRILOSEC) 20 MG capsule Take 20 mg by mouth daily. 02/21/15  Yes Historical Provider, MD  potassium chloride (K-DUR) 10 MEQ tablet Take 2 tablets (20 mEq total) by mouth daily. 01/10/15  Yes Imogene Burn, PA-C  saccharomyces boulardii (FLORASTOR) 250 MG capsule Take 250 mg by mouth 2 (two) times daily.   Yes Historical Provider, MD  albuterol (PROVENTIL  HFA;VENTOLIN HFA) 108 (90 BASE) MCG/ACT inhaler Inhale 2 puffs into the lungs every 4 (four) hours as needed for wheezing or shortness of breath. Patient not taking: Reported on 02/28/2015 08/12/13   Rexene Alberts, MD  furosemide (LASIX) 40 MG tablet Take 1 tablet (40 mg total) by mouth daily. Patient not taking: Reported on 02/28/2015 01/10/15   Imogene Burn, PA-C   BP 108/57 mmHg  Pulse 84  Temp(Src) 98.7 F (37.1 C) (Oral)  Resp 18  Ht 5\' 3"  (1.6 m)  Wt 103 lb (46.72 kg)  BMI 18.25 kg/m2  SpO2 93% Physical Exam  Constitutional: She is oriented to person, place, and time. She appears well-developed.  Patient appears generally weak  Cardiovascular: Normal rate.   Pulmonary/Chest: Effort normal. No respiratory distress.  Abdominal: There is tenderness.  Moderate diffuse tenderness, worse in the lower abdomen. No hernias palpated.  Musculoskeletal: She exhibits no edema.  Neurological: She is alert and oriented to person, place, and time.  Skin: Skin is warm.  Nursing note and vitals reviewed.   ED Course  Procedures (including critical care time) Labs Review Labs Reviewed  CBC WITH DIFFERENTIAL/PLATELET - Abnormal; Notable for the  following:    WBC 14.9 (*)    Platelets 442 (*)    Neutrophils Relative % 78 (*)    Neutro Abs 11.7 (*)    Monocytes Absolute 1.1 (*)    All other components within normal limits  COMPREHENSIVE METABOLIC PANEL - Abnormal; Notable for the following:    Chloride 94 (*)    CO2 34 (*)    Glucose, Bld 123 (*)    BUN 25 (*)    Albumin 3.1 (*)    ALT 9 (*)    All other components within normal limits  CBC - Abnormal; Notable for the following:    WBC 13.2 (*)    RBC 3.74 (*)    Hemoglobin 11.1 (*)    Platelets 472 (*)    All other components within normal limits  BASIC METABOLIC PANEL - Abnormal; Notable for the following:    Potassium 3.4 (*)    Chloride 96 (*)    Calcium 8.6 (*)    All other components within normal limits  CLOSTRIDIUM DIFFICILE BY PCR (NOT AT [facility])  URINALYSIS, ROUTINE W REFLEX MICROSCOPIC (NOT AT [facility])  LIPASE, BLOOD  TROPONIN I    Imaging Review Dg Chest 2 View  02/28/2015   CLINICAL DATA:  Hypotension with nausea and vomiting  EXAM: CHEST  2 VIEW  COMPARISON:  February 14, 2015  FINDINGS: Lungs are mildly hyperexpanded. There is no edema or consolidation. The heart size and pulmonary vascularity are normal. There is atherosclerotic change in the aortic arch. No bone lesions.  IMPRESSION: Lungs mildly hyperexpanded.  No edema or consolidation.   Electronically Signed   By: Lowella Grip III M.D.   On: 02/28/2015 13:25   Ct Abdomen Pelvis W Contrast  02/28/2015   CLINICAL DATA:  Abdominal pain, nausea vomiting x6 months worsened today. History of diverticulitis.  EXAM: CT ABDOMEN AND PELVIS WITH CONTRAST  TECHNIQUE: Multidetector CT imaging of the abdomen and pelvis was performed using the standard protocol following bolus administration of intravenous contrast.  CONTRAST:  170mL OMNIPAQUE IOHEXOL 300 MG/ML  SOLN  COMPARISON:  CT dated 12/19/2014  FINDINGS: Lower chest: Mild emphysematous changes. There has been interval resolution of the previously seen bilateral  pleural effusions and consolidative changes of the lung bases. A 6 mm right lung base  nodular density as well as linear densities at the right lung base due to the the new goals of go care for the likely represent residual scarring.  Peritoneum: No free air or free fluid.  Liver: Unremarkable.No intrahepatic biliary ductal dilatation.  Gallbladder: Unremarkable. No calcified gallstone. No pericholecystic fluid.  Pancreas: Unremarkable.No ductal dilatation.  Spleen: Unremarkable.  Adrenals glands: Unremarkable.  Kidneys, ureters, urinary bladder: There multiple bilateral renal cysts measuring up to 7 cm in the inferior pole of the left kidney appear grossly stable compared to prior study. Scattered subcentimeter left renal hypodense lesions are too small to characterize. There is no hydronephrosis on either side. The urinary bladder is grossly unremarkable.  Reproductive: Hysterectomy.  Undo  Bowel and appendix: There is extensive sigmoid diverticulosis. There is segmental thickening and inflammatory changes of the sigmoid colon compatible with diverticulitis. A 2.9 x 1.8 cm loculated fluid collection/abscess is noted lateral to the sigmoid colon in the left hemipelvis which is new from the study dated 12/19/2014 at appears similar to the abscess seen on the study dated 12/13/2014. Constipation no bowel obstruction. The appendix is unremarkable.  Vascular/Lymphatic: There is aortoiliac atherosclerotic disease. No lymphadenopathy.  Abdominal wall/Musculoskeletal: Degenerative changes of the spine. No acute fracture.  IMPRESSION: Sigmoid diverticulitis with diverticular abscess.  Constipation.  No bowel obstruction.   Electronically Signed   By: Anner Crete M.D.   On: 02/28/2015 16:06     EKG Interpretation   Date/Time:  Wednesday February 28 2015 12:14:24 EDT Ventricular Rate:  89 PR Interval:  180 QRS Duration: 107 QT Interval:  397 QTC Calculation: 483 R Axis:   88 Text Interpretation:  Sinus rhythm  Right atrial enlargement Anteroseptal  infarct, age indeterminate Confirmed by Lafawn Lenoir  MD, Kassidy Dockendorf [phone]) on  02/28/2015 1:49:28 PM      MDM   Final diagnoses:  Diverticulitis of large intestine with abscess without bleeding    Patient with abdominal pain and failure to thrive. Previous diverticular abscess that resolved with anabolic's according to CT scan. Also complicated by non-STEMI. Found out that the diverticulitis has returned as has the abscess. Discussed with Dr. Arnoldo Morale from general surgery. The patient counseled. Will admit to internal medicine. Initial episode also complicated by C. difficile.    Davonna Belling, MD 03/01/15 [phone]

## 2015-02-28 NOTE — Progress Notes (Signed)
ANTIBIOTIC CONSULT NOTE - FOLLOW UP  Pharmacy Consult for Zosyn  Indication: Intra-abdominal Infection, diverticular abscess   Allergies  Allergen Reactions  . Codeine Other (See Comments)    Makes patient not in right state of mind.   . Oxycodone Itching  . Talwin [Pentazocine] Other (See Comments)    Makes patient not in right state of mind.   . Valium [Diazepam] Other (See Comments)    Just doesn't work.     Patient Measurements: Height: 5\' 3"  (160 cm) Weight: 103 lb (46.72 kg) IBW/kg (Calculated) : 52.4  Vital Signs: Temp: 98.2 F (36.8 C) (06/29 1851) Temp Source: Oral (06/29 1851) BP: 135/85 mmHg (06/29 1851) Pulse Rate: 72 (06/29 1851) Intake/Output from previous day:   Intake/Output from this shift:    Labs:  Recent Labs  02/28/15 1229  WBC 14.9*  HGB 12.6  PLT 442*  CREATININE 0.86   Estimated Creatinine Clearance: 44.2 mL/min (by C-G formula based on Cr of 0.86). No results for input(s): VANCOTROUGH, VANCOPEAK, VANCORANDOM, GENTTROUGH, GENTPEAK, GENTRANDOM, TOBRATROUGH, TOBRAPEAK, TOBRARND, AMIKACINPEAK, AMIKACINTROU, AMIKACIN in the last 72 hours.   Microbiology: Recent Results (from the past 720 hour(s))  Urine culture     Status: None   Collection Time: 02/14/15 10:10 AM  Result Value Ref Range Status   Specimen Description URINE, CLEAN CATCH  Final   Special Requests NONE  Final   Culture   Final    30,000 COLONIES/mL DIPHTHEROIDS(CORYNEBACTERIUM SPECIES) 2,000 COLONIES/mL GRAM POSITIVE COCCI Performed at [facility]    Report Status 02/18/2015 FINAL  Final    Anti-infectives    Start     Dose/Rate Route Frequency Ordered Stop   02/28/15 1900  piperacillin-tazobactam (ZOSYN) IVPB 3.375 g     3.375 g 12.5 mL/hr over 240 Minutes Intravenous Every 8 hours 02/28/15 1857       Assessment: Okay for Protocol, renal function @ baseline.  Goal of Therapy:  Eradicate infection.  Plan:  Zosyn 3.375gm IV every 8 hours. Follow-up  micro data, labs, vitals.  Pricilla Larsson 02/28/2015,6:58 PM

## 2015-02-28 NOTE — ED Notes (Signed)
Pt reports has history of diverticulitis and was admitted in April.  Reports also had an MI at that time.  PT says has had problems with colon ever since then.  Reports was admitted approx 1 week ago over night for n/v and abd pain.  Pt sent from Dr. Olevia Perches office today because pt was orthostatic.   LBM was yesterday and was very small.  Pt unable to eat because of pain and nausea.

## 2015-02-28 NOTE — Patient Instructions (Signed)
Patient to go directly to ED

## 2015-02-28 NOTE — ED Notes (Signed)
12 lead EKG done at 1214 by Tammi Klippel, ED tech and given to Dr. Sabra Heck at [phone] by myself

## 2015-02-28 NOTE — H&P (Signed)
[facility] History and Physical  [name] TDV:[id number] DOB: 04-Jun-1941 DOA: 02/28/2015  Referring physician: Dr Alvino Chapel - [facility] PCP: Glo Herring., MD   Chief Complaint: Abd pain  HPI: Lindsay Mitchell is a 74 y.o. female  Abd pain. Comes and goes. Worse w/ movement and during defecation. Improves partially 30 minutes after defecation. Going for 7 days.  getting worse. Associated with nausea, constipation, fatigue. Patient only having bowel movements every 3 days. Some improvement in nausea with Pepto-Bismol. Poor oral intake during this period time. Last antibiotics for diverticulitis and C. difficile in early May.   Review of Systems:  Constitutional:  No weight loss, night sweats, Fevers, chills, fatigue.  HEENT:  No headaches, Difficulty swallowing,Tooth/dental problems,Sore throat,  No sneezing, itching, ear ache, nasal congestion, post nasal drip,  Cardio-vascular:  No chest pain, Orthopnea, PND, swelling in lower extremities, anasarca, dizziness, palpitations  GI: Per HPI Resp:   No shortness of breath with exertion or at rest. No excess mucus, no productive cough, No non-productive cough, No coughing up of blood.No change in color of mucus.No wheezing.No chest wall deformity  Skin:  no rash or lesions.  GU:  no dysuria, change in color of urine, no urgency or frequency. No flank pain.  Musculoskeletal:   No joint pain or swelling. No decreased range of motion. No back pain.  Psych:  No change in mood or affect. No depression or anxiety. No memory loss.   Past Medical History  Diagnosis Date  . Constipation   . Depression with anxiety   . Diverticulosis   . DVT (deep venous thrombosis)   . COPD (chronic obstructive pulmonary disease)     Emphysema radiographically  . Carotid artery disease     a. duplex 0/6269: RICA <48%, LICA 54-62%. Followed by [facility].  . Anxiety   . TIA (transient ischemic attack)   . Full dentures   . Pulmonary nodules  08/11/2013    a. CT angio 08/2013: 4-39mm nodules in RUL/RML, f/u recommended 6 months.  . Migraine   . Fibromyalgia   . Stroke   . Protein calorie malnutrition   . First degree AV block   . On home O2   . Myocardial infarction    Past Surgical History  Procedure Laterality Date  . Abdominal hysterectomy  1971  . Back surgery      X3  . Colonoscopy  June 2002    Dr. Ferdinand Lango: severe diverticular disease with haustral hypertrophy, primary and secondary diverticulosis, persistent spasticity, early stenosis  . Colonoscopy with propofol N/A 06/21/2013    Dr. Oneida Alar: sigmoid colon and descending colon with diverticula, small internal hemorrhoids  . Tonsillectomy    . Appendectomy    . Eye surgery      both cataracts-lenses  . Nasal sinus surgery Right 07/11/2013    Procedure: RIGHT ENDOSCOPIC ANTERIOR ETHMOIDECTOMY/RIGHT ENDOSCOPIC MAXILLARY ANTROSTOMY WITH REMOVAL OF TISSUE;  Surgeon: Ascencion Dike, MD;  Location: [place];  Service: ENT;  Laterality: Right;  . Esophagogastroduodenoscopy N/A 03/06/2014    Procedure: ESOPHAGOGASTRODUODENOSCOPY (EGD);  Surgeon: Danie Binder, MD;  [facility];  Service: Endoscopy;  Laterality: N/A;  9;30  . Savory dilation N/A 03/06/2014    Procedure: SAVORY DILATION;  Surgeon: Danie Binder, MD;  [facility];  Service: Endoscopy;  Laterality: N/A;  . Esophageal biopsy  03/06/2014    Procedure: BIOPSY;  Surgeon: Danie Binder, MD;  [facility];  Service: Endoscopy;;  .  Other surgical history      scar tissue removal x2  . Left heart catheterization with coronary angiogram N/A 12/21/2014    Procedure: LEFT HEART CATHETERIZATION WITH CORONARY ANGIOGRAM;  Surgeon: Belva Crome, MD;  [facility] C[facility];  Service: Cardiovascular;  Laterality: N/A;   Social History:  reports that she quit smoking about 2 months ago. Her smoking use included Cigarettes. She has a 50 pack-year smoking history. She does not have any  smokeless tobacco history on file. She reports that she does not drink alcohol or use illicit drugs.  Allergies  Allergen Reactions  . Codeine Other (See Comments)    Makes patient not in right state of mind.   . Oxycodone Itching  . Talwin [Pentazocine] Other (See Comments)    Makes patient not in right state of mind.   . Valium [Diazepam] Other (See Comments)    Just doesn't work.     Family History  Problem Relation Age of Onset  . Colon cancer Neg Hx   . Breast cancer Mother   . Heart disease Mother   . Hypertension Mother   . Heart attack Mother   . Other Mother     varicose veins  . Cancer Mother   . Varicose Veins Mother   . Diabetes Brother   . Hypertension Brother   . Heart disease Brother   . Throat cancer Father   . Cancer Father      Prior to Admission medications   Medication Sig Start Date End Date Taking? Authorizing Provider  aspirin 81 MG chewable tablet Chew 1 tablet (81 mg total) by mouth daily. 12/28/14  Yes Reyne Dumas, MD  atorvastatin (LIPITOR) 80 MG tablet Take 1 tablet (80 mg total) by mouth daily at 6 PM. 01/10/15  Yes Imogene Burn, PA-C  budesonide-formoterol (SYMBICORT) 80-4.5 MCG/ACT inhaler Inhale 2 puffs into the lungs 2 (two) times daily. 12/28/14  Yes Reyne Dumas, MD  furosemide (LASIX) 20 MG tablet Take 20 mg by mouth daily.   Yes Historical Provider, MD  gabapentin (NEURONTIN) 300 MG capsule Take 3 capsules (900 mg total) by mouth 3 (three) times daily. 11/09/14  Yes Melvenia Beam, MD  HYDROcodone-acetaminophen (NORCO) 10-325 MG per tablet Take 1 tablet by mouth every 6 (six) hours as needed for moderate pain or severe pain.   Yes Historical Provider, MD  ipratropium-albuterol (DUONEB) 0.5-2.5 (3) MG/3ML SOLN Take 3 mLs by nebulization 3 (three) times daily. 08/12/13  Yes Rexene Alberts, MD  Linaclotide [facility]) 145 MCG CAPS capsule Take 1 capsule (145 mcg total) by mouth daily. 02/15/15  Yes Lezlie Octave Black, NP  omeprazole (PRILOSEC) 20 MG  capsule Take 20 mg by mouth daily. 02/21/15  Yes Historical Provider, MD  potassium chloride (K-DUR) 10 MEQ tablet Take 2 tablets (20 mEq total) by mouth daily. 01/10/15  Yes Imogene Burn, PA-C  saccharomyces boulardii (FLORASTOR) 250 MG capsule Take 250 mg by mouth 2 (two) times daily.   Yes Historical Provider, MD  albuterol (PROVENTIL HFA;VENTOLIN HFA) 108 (90 BASE) MCG/ACT inhaler Inhale 2 puffs into the lungs every 4 (four) hours as needed for wheezing or shortness of breath. Patient not taking: Reported on 02/28/2015 08/12/13   Rexene Alberts, MD  furosemide (LASIX) 40 MG tablet Take 1 tablet (40 mg total) by mouth daily. Patient not taking: Reported on 02/28/2015 01/10/15   Imogene Burn, PA-C   Physical Exam: Filed Vitals:   02/28/15 1600 02/28/15 1630 02/28/15 1700 02/28/15 1730  BP: 113/75 119/78 132/76 119/89  Pulse:    31  Temp:      TempSrc:      Resp: 15 13 16 14   SpO2:    75%    Wt Readings from Last 3 Encounters:  02/28/15 46.72 kg (103 lb)  02/15/15 49.941 kg (110 lb 1.6 oz)  01/10/15 48.988 kg (108 lb)    General:  Appears calm and comfortable Eyes:  PERRL, normal lids, irises & conjunctiva ENT:  grossly normal hearing, lips & tongue Neck:  no LAD, masses or thyromegaly Cardiovascular:  RRR, faint heart sounds, 2/6 systolic murmur  Trace LE edema. Respiratory:  CTA bilaterally, no w/r/r. Normal respiratory effort. Abdomen: Generalized tenderness with deep palpation, nondistended, normal active bowel sounds  Skin:  no rash or induration seen on limited exam Musculoskeletal:  grossly normal tone BUE/BLE Psychiatric:  grossly normal mood and affect, speech fluent and appropriate Neurologic:  grossly non-focal.          Labs on Admission:  Basic Metabolic Panel:  Recent Labs Lab 02/28/15 1229  NA 139  K 3.7  CL 94*  CO2 34*  GLUCOSE 123*  BUN 25*  CREATININE 0.86  CALCIUM 9.2   Liver Function Tests:  Recent Labs Lab 02/28/15 1229  AST 19  ALT  9*  ALKPHOS 80  BILITOT 0.6  PROT 7.3  ALBUMIN 3.1*    Recent Labs Lab 02/28/15 1229  LIPASE 24   No results for input(s): AMMONIA in the last 168 hours. CBC:  Recent Labs Lab 02/28/15 1229  WBC 14.9*  NEUTROABS 11.7*  HGB 12.6  HCT 39.4  MCV 96.1  PLT 442*   Cardiac Enzymes:  Recent Labs Lab 02/28/15 1229  TROPONINI <0.03    BNP (last 3 results)  Recent Labs  12/23/14 0515 02/14/15 0949  BNP 1234.6* 69.0    ProBNP (last 3 results) No results for input(s): PROBNP in the last 8760 hours.  CBG: No results for input(s): GLUCAP in the last 168 hours.  Radiological Exams on Admission: Dg Chest 2 View  02/28/2015   CLINICAL DATA:  Hypotension with nausea and vomiting  EXAM: CHEST  2 VIEW  COMPARISON:  February 14, 2015  FINDINGS: Lungs are mildly hyperexpanded. There is no edema or consolidation. The heart size and pulmonary vascularity are normal. There is atherosclerotic change in the aortic arch. No bone lesions.  IMPRESSION: Lungs mildly hyperexpanded.  No edema or consolidation.   Electronically Signed   By: Lowella Grip III M.D.   On: 02/28/2015 13:25   Ct Abdomen Pelvis W Contrast  02/28/2015   CLINICAL DATA:  Abdominal pain, nausea vomiting x6 months worsened today. History of diverticulitis.  EXAM: CT ABDOMEN AND PELVIS WITH CONTRAST  TECHNIQUE: Multidetector CT imaging of the abdomen and pelvis was performed using the standard protocol following bolus administration of intravenous contrast.  CONTRAST:  154mL OMNIPAQUE IOHEXOL 300 MG/ML  SOLN  COMPARISON:  CT dated 12/19/2014  FINDINGS: Lower chest: Mild emphysematous changes. There has been interval resolution of the previously seen bilateral pleural effusions and consolidative changes of the lung bases. A 6 mm right lung base nodular density as well as linear densities at the right lung base due to the the new goals of go care for the likely represent residual scarring.  Peritoneum: No free air or free  fluid.  Liver: Unremarkable.No intrahepatic biliary ductal dilatation.  Gallbladder: Unremarkable. No calcified gallstone. No pericholecystic fluid.  Pancreas: Unremarkable.No ductal dilatation.  Spleen: Unremarkable.  Adrenals glands: Unremarkable.  Kidneys, ureters, urinary bladder: There multiple bilateral renal cysts measuring up to 7 cm in the inferior pole of the left kidney appear grossly stable compared to prior study. Scattered subcentimeter left renal hypodense lesions are too small to characterize. There is no hydronephrosis on either side. The urinary bladder is grossly unremarkable.  Reproductive: Hysterectomy.  Undo  Bowel and appendix: There is extensive sigmoid diverticulosis. There is segmental thickening and inflammatory changes of the sigmoid colon compatible with diverticulitis. A 2.9 x 1.8 cm loculated fluid collection/abscess is noted lateral to the sigmoid colon in the left hemipelvis which is new from the study dated 12/19/2014 at appears similar to the abscess seen on the study dated 12/13/2014. Constipation no bowel obstruction. The appendix is unremarkable.  Vascular/Lymphatic: There is aortoiliac atherosclerotic disease. No lymphadenopathy.  Abdominal wall/Musculoskeletal: Degenerative changes of the spine. No acute fracture.  IMPRESSION: Sigmoid diverticulitis with diverticular abscess.  Constipation.  No bowel obstruction.   Electronically Signed   By: Anner Crete M.D.   On: 02/28/2015 16:06    Assessment/Plan Principal Problem:   Colonic diverticular abscess Active Problems:   Constipation   COPD (chronic obstructive pulmonary disease)   Protein-calorie malnutrition, severe   Diverticulitis of intestine with abscess   Fibromyalgia   Leg edema   HLD (hyperlipidemia)   Abd pain: Likely secondary to diverticular abscess is noted on CT. Patient with recent history of epididymitis and C. difficile. Last round of antibiotics in early May. WBC 14.9. Afebrile vital signs  stable. General surgery, Dr. Arnoldo Morale, consult by ED and will follow. - cont Probiotic - Zosyn - Follow general surgery recommendations - Snokot and Miralax - Zofran - Cdiff PCR  COPD: No current exacerbation - Continue support  HLD - Cont Statin  Chronic pain/Fibromyalgia: - Continue Neurontin and Norco.  H/o TIA/MI: - continue ASA  GERD: - continue PPI  LE edema: venous insufficiency. At baseline - cont Lasix  Code Status: FUll DVT Prophylaxis: Hep Family Communication: Husband Disposition Plan: Pending improveemtn  Jerianne Anselmo Lenna Sciara, MD Family Medicine [facility] [email or web address] Password [email or web address]

## 2015-02-28 NOTE — Progress Notes (Signed)
Subjective:    Patient ID: Lindsay Mitchell, female    DOB: 02/28/1941, 74 y.o.   MRN: [id number]  HPI Recently in hospital this month for chronic respiratory failure due to COPD. Patient has a hx of constipaton. She had a BM yesterday and was very small and hard. Takes Linzess but has not taken recently. She says she really has not eaten very well in the past few weeks. She has had nausea for 6-8 months.  Recently admitted to [facility] in April with diverticulitis/abdominal abscess. She was evaluated by Dr. Arnoldo Morale and advised bowel rest and antibiotics. She was transfused to [facility] same admission for chest pain. She underwent a cardiac cath which revealed:Marland Kitchen Moderate to heavy three-vessel coronary calcification  No significant obstructive coronary disease is noted. There is an eccentric 40% proximal RCA. Marland Kitchen Overall normal LV function with EF 55% and no regional wall motion abnormality   Patient is hypotensive in office today. She is lying on bench. She has nausea and vomiting. She is pale. She has abdominal pain. She appears very sick.  Colonoscopy 06/21/2013 for chronic constipation. Normal mucosa in the terminal ileum. Moderate diverticulosis in the sigmoid colon and descending colon. Small internal hemorrhoids. Colon is redundant and angulated at the rectosigmoid junction.   12/2014 C diff positive and covered with Flagyl 12/13/2014 CT abdomen/pelvis with CM:  IMPRESSION: Sigmoid diverticulosis. 2 cm fluid collection adjacent to the sigmoid colon in the left pelvis. While this could reflect a small fluid collection/abscess related to diverticulitis, this also may represent a small cystic structure within the left ovary, but is new since 2014. I favor this is a small diverticular abscess. Consider treatment and repeat short-term imaging.  Follow up CT abdomen/pelvis with CM 12/18/2013  revealed : IMPRESSION: Improvement in diverticulitis. Resolution of diverticular abscess in the sigmoid  region. Interval development of pericholecystic fluid. No gallbladder thickening or gallstones. Correlate with pain in this area and possible ultrasound if indicated  Interval development of bilateral pleural effusions and bibasilar  She has lower abdominal pain which she says has had for several months. She says she does not feel good.  She has not eaten .She is very nauseated. She has not had a fever. Husband states patient is almost bedridden. Patient is extemely weak with a low blood pressure. Her husband says she has not eaten a handful of food in over a week.   PROCEDURE DATE: 03/06/2014 dysphagia, dyspepsia PROCEDURE: EGD with biopsy and EGD with dilatation over guidewire Dr. Oneida Alar ENDOSCOPIC IMPRESSION: 1. Schatzki ring at the gastroesophageal junction 2. MILD Non-erosive gastritis  I       Review of Systems Past Medical History  Diagnosis Date  . Constipation   . Depression with anxiety   . Diverticulosis   . DVT (deep venous thrombosis)   . COPD (chronic obstructive pulmonary disease)     Emphysema radiographically  . Carotid artery disease     a. duplex 04/1016: RICA <51%, LICA 02-58%. Followed by [facility].  . Anxiety   . TIA (transient ischemic attack)   . Full dentures   . Pulmonary nodules 08/11/2013    a. CT angio 08/2013: 4-56mm nodules in RUL/RML, f/u recommended 6 months.  . Migraine   . Fibromyalgia   . Stroke   . Protein calorie malnutrition   . First degree AV block   . On home O2   . Myocardial infarction     Past Surgical History  Procedure Laterality Date  . Abdominal hysterectomy  1971  . Back surgery      X3  . Colonoscopy  June 2002    Dr. Ferdinand Lango: severe diverticular disease with haustral hypertrophy, primary and secondary diverticulosis, persistent spasticity, early stenosis  . Colonoscopy with propofol N/A 06/21/2013    Dr. Oneida Alar: sigmoid colon and descending colon with diverticula, small internal hemorrhoids  . Tonsillectomy     . Appendectomy    . Eye surgery      both cataracts-lenses  . Nasal sinus surgery Right 07/11/2013    Procedure: RIGHT ENDOSCOPIC ANTERIOR ETHMOIDECTOMY/RIGHT ENDOSCOPIC MAXILLARY ANTROSTOMY WITH REMOVAL OF TISSUE;  Surgeon: Ascencion Dike, MD;  Location: [place];  Service: ENT;  Laterality: Right;  . Esophagogastroduodenoscopy N/A 03/06/2014    Procedure: ESOPHAGOGASTRODUODENOSCOPY (EGD);  Surgeon: Danie Binder, MD;  [facility];  Service: Endoscopy;  Laterality: N/A;  9;30  . Savory dilation N/A 03/06/2014    Procedure: SAVORY DILATION;  Surgeon: Danie Binder, MD;  [facility];  Service: Endoscopy;  Laterality: N/A;  . Esophageal biopsy  03/06/2014    Procedure: BIOPSY;  Surgeon: Danie Binder, MD;  [facility];  Service: Endoscopy;;  . Other surgical history      scar tissue removal x2  . Left heart catheterization with coronary angiogram N/A 12/21/2014    Procedure: LEFT HEART CATHETERIZATION WITH CORONARY ANGIOGRAM;  Surgeon: Belva Crome, MD;  Location: [facility] CATH LAB;  Service: Cardiovascular;  Laterality: N/A;    Allergies  Allergen Reactions  . Codeine Other (See Comments)    Makes patient not in right state of mind.   . Oxycodone Itching  . Talwin [Pentazocine] Other (See Comments)    Makes patient not in right state of mind.   . Valium [Diazepam] Other (See Comments)    Just doesn't work.     No current facility-administered medications on file prior to visit.   Current Outpatient Prescriptions on File Prior to Visit  Medication Sig Dispense Refill  . albuterol (PROVENTIL HFA;VENTOLIN HFA) 108 (90 BASE) MCG/ACT inhaler Inhale 2 puffs into the lungs every 4 (four) hours as needed for wheezing or shortness of breath. 1 Inhaler 12  . aspirin 81 MG chewable tablet Chew 1 tablet (81 mg total) by mouth daily. 30 tablet 1  . atorvastatin (LIPITOR) 80 MG tablet Take 1 tablet (80 mg total) by mouth daily at 6 PM. 30 tablet 3  .  budesonide-formoterol (SYMBICORT) 80-4.5 MCG/ACT inhaler Inhale 2 puffs into the lungs 2 (two) times daily. 1 Inhaler 12  . furosemide (LASIX) 40 MG tablet Take 1 tablet (40 mg total) by mouth daily. 30 tablet 3  . gabapentin (NEURONTIN) 300 MG capsule Take 3 capsules (900 mg total) by mouth 3 (three) times daily. 270 capsule 6  . HYDROcodone-acetaminophen (NORCO) 10-325 MG per tablet Take 1 tablet by mouth every 6 (six) hours as needed for moderate pain or severe pain.    Marland Kitchen ipratropium-albuterol (DUONEB) 0.5-2.5 (3) MG/3ML SOLN Take 3 mLs by nebulization 3 (three) times daily.    . Linaclotide (LINZESS) 145 MCG CAPS capsule Take 1 capsule (145 mcg total) by mouth daily. 30 capsule 0  . potassium chloride (K-DUR) 10 MEQ tablet Take 2 tablets (20 mEq total) by mouth daily. 60 tablet 3  . saccharomyces boulardii (FLORASTOR) 250 MG capsule Take 250 mg by mouth 2 (two) times daily.          Objective:   Physical Exam Blood pressure 80/60, pulse  64, temperature 97.8 F (36.6 C), height 5\' 3"  (1.6 m), weight 103 lb (46.72 kg).  Alert and oriented. Skin warm and dry. Oral mucosa is dry  . Sclera anicteric, conjunctivae is pink. Thyroid not enlarged. No cervical lymphadenopathy. Lungs clear. Heart regular rate and rhythm.  Abdomen is soft. Bowel sounds are positive. Diffuse tenderness. Patient actively vomiting in the office.  No hepatomegaly. No abdominal masses felt.    No edema to lower extremities.         Assessment & Plan:  Hypotension. N and V Abdominal pain. Weakness. Appears very pale in the ED. ? Diverticulitis.  I advised husband to take patient straight to the ED. I have called ED to let them know she is on the way.

## 2015-02-28 NOTE — ED Notes (Signed)
Report given to Rodman Key, all questions answered.

## 2015-03-01 DIAGNOSIS — J449 Chronic obstructive pulmonary disease, unspecified: Secondary | ICD-10-CM

## 2015-03-01 DIAGNOSIS — A419 Sepsis, unspecified organism: Secondary | ICD-10-CM | POA: Insufficient documentation

## 2015-03-01 LAB — CBC
HCT: 36.1 % (ref 36.0–46.0)
Hemoglobin: 11.1 g/dL — ABNORMAL LOW (ref 12.0–15.0)
MCH: 29.7 pg (ref 26.0–34.0)
MCHC: 30.7 g/dL (ref 30.0–36.0)
MCV: 96.5 fL (ref 78.0–100.0)
Platelets: 472 10*3/uL — ABNORMAL HIGH (ref 150–400)
RBC: 3.74 MIL/uL — ABNORMAL LOW (ref 3.87–5.11)
RDW: 14.8 % (ref 11.5–15.5)
WBC: 13.2 10*3/uL — ABNORMAL HIGH (ref 4.0–10.5)

## 2015-03-01 LAB — BASIC METABOLIC PANEL
Anion gap: 10 (ref 5–15)
BUN: 20 mg/dL (ref 6–20)
CO2: 32 mmol/L (ref 22–32)
Calcium: 8.6 mg/dL — ABNORMAL LOW (ref 8.9–10.3)
Chloride: 96 mmol/L — ABNORMAL LOW (ref 101–111)
Creatinine, Ser: 0.81 mg/dL (ref 0.44–1.00)
GFR calc Af Amer: >60 mL/min (ref >60)
GFR calc non Af Amer: >60 mL/min (ref >60)
Glucose, Bld: 92 mg/dL (ref 65–99)
Potassium: 3.4 mmol/L — ABNORMAL LOW (ref 3.5–5.1)
Sodium: 138 mmol/L (ref 135–145)

## 2015-03-01 MED ORDER — IPRATROPIUM-ALBUTEROL 0.5-2.5 (3) MG/3ML IN SOLN
3.0000 mL | Freq: Two times a day (BID) | RESPIRATORY_TRACT | Status: DC
  Administered 2015-03-01 – 2015-03-08 (×14): 3 mL via RESPIRATORY_TRACT
  Filled 2015-03-01 (×14): qty 3

## 2015-03-01 MED ORDER — BISACODYL 10 MG RE SUPP
10.0000 mg | Freq: Every day | RECTAL | Status: DC | PRN
  Administered 2015-03-02 – 2015-03-04 (×2): 10 mg via RECTAL
  Filled 2015-03-01 (×3): qty 1

## 2015-03-01 NOTE — Care Management Note (Signed)
Case Management Note  Patient Details  Name: Lindsay Mitchell MRN: [id number] Date of Birth: 12-26-1940  Subjective/Objective:                  Pt admitted from home with diverticulitis with abcess. Pt lives with her husband and will return home at discharge. Pt is independent with ADL's. Pt has home O2 and neb machine in place.  Action/Plan: No Cm needs noted.  Expected Discharge Date:                  Expected Discharge Plan:  Home/Self Care  In-House Referral:  NA  Discharge planning Services  CM Consult  Post Acute Care Choice:  NA Choice offered to:  NA  DME Arranged:    DME Agency:     HH Arranged:    HH Agency:     Status of Service:  Completed, signed off  Medicare Important Message Given:    Date Medicare IM Given:    Medicare IM give by:    Date Additional Medicare IM Given:    Additional Medicare Important Message give by:     If discussed at [place] of Stay Meetings, dates discussed:    Additional Comments:  Joylene Draft, RN 03/01/2015, 3:01 PM

## 2015-03-01 NOTE — Consult Note (Signed)
Reason for Consult: Recurrent sigmoid diverticulitis with obtained perforation Referring Physician: Hospitalist  Lindsay Mitchell is an 74 y.o. female.  HPI: Patient is a 74 year old white female who was hospitalized in May of this year with the sigmoid colon diverticular perforation which resolved without surgery. Her course was complicated by chest pain requiring cardiac catheterization. She was being followed by Dr. Laural Golden of GI and was found to have worsening lower abdominal pain. CT scan the abdomen and pelvis revealed a recurrence of the sigmoid diverticular perforation. No free fluid is noted. Patient states she has had intermittent lower abdominal pain since her last admission.  Past Medical History  Diagnosis Date  . Constipation   . Depression with anxiety   . Diverticulosis   . DVT (deep venous thrombosis)   . COPD (chronic obstructive pulmonary disease)     Emphysema radiographically  . Carotid artery disease     a. duplex 12/9739: RICA <63%, LICA 84-53%. Followed by [facility].  . Anxiety   . TIA (transient ischemic attack)   . Full dentures   . Pulmonary nodules 08/11/2013    a. CT angio 08/2013: 4-55m nodules in RUL/RML, f/u recommended 6 months.  . Migraine   . Fibromyalgia   . Stroke   . Protein calorie malnutrition   . First degree AV block   . On home O2   . Myocardial infarction     Past Surgical History  Procedure Laterality Date  . Abdominal hysterectomy  1971  . Back surgery      X3  . Colonoscopy  June 2002    Dr. PFerdinand Lango severe diverticular disease with haustral hypertrophy, primary and secondary diverticulosis, persistent spasticity, early stenosis  . Colonoscopy with propofol N/A 06/21/2013    Dr. FOneida Alar sigmoid colon and descending colon with diverticula, small internal hemorrhoids  . Tonsillectomy    . Appendectomy    . Eye surgery      both cataracts-lenses  . Nasal sinus surgery Right 07/11/2013    Procedure: RIGHT ENDOSCOPIC ANTERIOR  ETHMOIDECTOMY/RIGHT ENDOSCOPIC MAXILLARY ANTROSTOMY WITH REMOVAL OF TISSUE;  Surgeon: SAscencion Dike MD;  [facility]  Service: ENT;  Laterality: Right;  . Esophagogastroduodenoscopy N/A 03/06/2014    Procedure: ESOPHAGOGASTRODUODENOSCOPY (EGD);  Surgeon: SDanie Binder MD;  [facility];  Service: Endoscopy;  Laterality: N/A;  9;30  . Savory dilation N/A 03/06/2014    Procedure: SAVORY DILATION;  Surgeon: SDanie Binder MD;  [facility];  Service: Endoscopy;  Laterality: N/A;  . Esophageal biopsy  03/06/2014    Procedure: BIOPSY;  Surgeon: SDanie Binder MD;  [facility];  Service: Endoscopy;;  . Other surgical history      scar tissue removal x2  . Left heart catheterization with coronary angiogram N/A 12/21/2014    Procedure: LEFT HEART CATHETERIZATION WITH CORONARY ANGIOGRAM;  Surgeon: HBelva Crome MD;  [facility];  Service: Cardiovascular;  Laterality: N/A;    Family History  Problem Relation Age of Onset  . Colon cancer Neg Hx   . Breast cancer Mother   . Heart disease Mother   . Hypertension Mother   . Heart attack Mother   . Other Mother     varicose veins  . Cancer Mother   . Varicose Veins Mother   . Diabetes Brother   . Hypertension Brother   . Heart disease Brother   . Throat cancer Father   . Cancer Father  Social History:  reports that she quit smoking about 2 months ago. Her smoking use included Cigarettes. She has a 50 pack-year smoking history. She does not have any smokeless tobacco history on file. She reports that she does not drink alcohol or use illicit drugs.  Allergies:  Allergies  Allergen Reactions  . Codeine Other (See Comments)    Makes patient not in right state of mind.   . Oxycodone Itching  . Talwin [Pentazocine] Other (See Comments)    Makes patient not in right state of mind.   . Valium [Diazepam] Other (See Comments)    Just doesn't work.     Medications: I have  reviewed the patient's current medications.  Results for orders placed or performed during the hospital encounter of 02/28/15 (from the past 48 hour(s))  Urinalysis, Routine w reflex microscopic (not at [facility])     Status: None   Collection Time: 02/28/15 12:09 PM  Result Value Ref Range   Color, Urine YELLOW YELLOW   APPearance CLEAR CLEAR   Specific Gravity, Urine 1.015 1.005 - 1.030   pH 6.0 5.0 - 8.0   Glucose, UA NEGATIVE NEGATIVE mg/dL   Hgb urine dipstick NEGATIVE NEGATIVE   Bilirubin Urine NEGATIVE NEGATIVE   Ketones, ur NEGATIVE NEGATIVE mg/dL   Protein, ur NEGATIVE NEGATIVE mg/dL   Urobilinogen, UA 0.2 0.0 - 1.0 mg/dL   Nitrite NEGATIVE NEGATIVE   Leukocytes, UA NEGATIVE NEGATIVE    Comment: MICROSCOPIC NOT DONE ON URINES WITH NEGATIVE PROTEIN, BLOOD, LEUKOCYTES, NITRITE, OR GLUCOSE <1000 mg/dL.  CBC with Differential     Status: Abnormal   Collection Time: 02/28/15 12:29 PM  Result Value Ref Range   WBC 14.9 (H) 4.0 - 10.5 K/uL   RBC 4.10 3.87 - 5.11 MIL/uL   Hemoglobin 12.6 12.0 - 15.0 g/dL   HCT 39.4 36.0 - 46.0 %   MCV 96.1 78.0 - 100.0 fL   MCH 30.7 26.0 - 34.0 pg   MCHC 32.0 30.0 - 36.0 g/dL   RDW 14.6 11.5 - 15.5 %   Platelets 442 (H) 150 - 400 K/uL   Neutrophils Relative % 78 (H) 43 - 77 %   Neutro Abs 11.7 (H) 1.7 - 7.7 K/uL   Lymphocytes Relative 13 12 - 46 %   Lymphs Abs 1.9 0.7 - 4.0 K/uL   Monocytes Relative 7 3 - 12 %   Monocytes Absolute 1.1 (H) 0.1 - 1.0 K/uL   Eosinophils Relative 1 0 - 5 %   Eosinophils Absolute 0.2 0.0 - 0.7 K/uL   Basophils Relative 1 0 - 1 %   Basophils Absolute 0.1 0.0 - 0.1 K/uL  Comprehensive metabolic panel     Status: Abnormal   Collection Time: 02/28/15 12:29 PM  Result Value Ref Range   Sodium 139 135 - 145 mmol/L   Potassium 3.7 3.5 - 5.1 mmol/L   Chloride 94 (L) 101 - 111 mmol/L   CO2 34 (H) 22 - 32 mmol/L   Glucose, Bld 123 (H) 65 - 99 mg/dL   BUN 25 (H) 6 - 20 mg/dL   Creatinine, Ser 0.86 0.44 - 1.00 mg/dL    Calcium 9.2 8.9 - 10.3 mg/dL   Total Protein 7.3 6.5 - 8.1 g/dL   Albumin 3.1 (L) 3.5 - 5.0 g/dL   AST 19 15 - 41 U/L   ALT 9 (L) 14 - 54 U/L   Alkaline Phosphatase 80 38 - 126 U/L   Total Bilirubin 0.6 0.3 - 1.2  mg/dL   GFR calc non Af Amer >60 >60 mL/min   GFR calc Af Amer >60 >60 mL/min    Comment: (NOTE) The eGFR has been calculated using the CKD EPI equation. This calculation has not been validated in all clinical situations. eGFR's persistently <60 mL/min signify possible Chronic Kidney Disease.    Anion gap 11 5 - 15  Lipase, blood     Status: None   Collection Time: 02/28/15 12:29 PM  Result Value Ref Range   Lipase 24 22 - 51 U/L  Troponin I     Status: None   Collection Time: 02/28/15 12:29 PM  Result Value Ref Range   Troponin I <0.03 <0.031 ng/mL    Comment:        NO INDICATION OF MYOCARDIAL INJURY.   CBC     Status: Abnormal   Collection Time: 03/01/15  6:34 AM  Result Value Ref Range   WBC 13.2 (H) 4.0 - 10.5 K/uL   RBC 3.74 (L) 3.87 - 5.11 MIL/uL   Hemoglobin 11.1 (L) 12.0 - 15.0 g/dL   HCT 36.1 36.0 - 46.0 %   MCV 96.5 78.0 - 100.0 fL   MCH 29.7 26.0 - 34.0 pg   MCHC 30.7 30.0 - 36.0 g/dL   RDW 14.8 11.5 - 15.5 %   Platelets 472 (H) 150 - 400 K/uL  Basic metabolic panel     Status: Abnormal   Collection Time: 03/01/15  6:34 AM  Result Value Ref Range   Sodium 138 135 - 145 mmol/L   Potassium 3.4 (L) 3.5 - 5.1 mmol/L   Chloride 96 (L) 101 - 111 mmol/L   CO2 32 22 - 32 mmol/L   Glucose, Bld 92 65 - 99 mg/dL   BUN 20 6 - 20 mg/dL   Creatinine, Ser 0.81 0.44 - 1.00 mg/dL   Calcium 8.6 (L) 8.9 - 10.3 mg/dL   GFR calc non Af Amer >60 >60 mL/min   GFR calc Af Amer >60 >60 mL/min    Comment: (NOTE) The eGFR has been calculated using the CKD EPI equation. This calculation has not been validated in all clinical situations. eGFR's persistently <60 mL/min signify possible Chronic Kidney Disease.    Anion gap 10 5 - 15    Dg Chest 2 View  02/28/2015    CLINICAL DATA:  Hypotension with nausea and vomiting  EXAM: CHEST  2 VIEW  COMPARISON:  February 14, 2015  FINDINGS: Lungs are mildly hyperexpanded. There is no edema or consolidation. The heart size and pulmonary vascularity are normal. There is atherosclerotic change in the aortic arch. No bone lesions.  IMPRESSION: Lungs mildly hyperexpanded.  No edema or consolidation.   Electronically Signed   By: Lowella Grip III M.D.   On: 02/28/2015 13:25   Ct Abdomen Pelvis W Contrast  02/28/2015   CLINICAL DATA:  Abdominal pain, nausea vomiting x6 months worsened today. History of diverticulitis.  EXAM: CT ABDOMEN AND PELVIS WITH CONTRAST  TECHNIQUE: Multidetector CT imaging of the abdomen and pelvis was performed using the standard protocol following bolus administration of intravenous contrast.  CONTRAST:  128m OMNIPAQUE IOHEXOL 300 MG/ML  SOLN  COMPARISON:  CT dated 12/19/2014  FINDINGS: Lower chest: Mild emphysematous changes. There has been interval resolution of the previously seen bilateral pleural effusions and consolidative changes of the lung bases. A 6 mm right lung base nodular density as well as linear densities at the right lung base due to the the new goals of  go care for the likely represent residual scarring.  Peritoneum: No free air or free fluid.  Liver: Unremarkable.No intrahepatic biliary ductal dilatation.  Gallbladder: Unremarkable. No calcified gallstone. No pericholecystic fluid.  Pancreas: Unremarkable.No ductal dilatation.  Spleen: Unremarkable.  Adrenals glands: Unremarkable.  Kidneys, ureters, urinary bladder: There multiple bilateral renal cysts measuring up to 7 cm in the inferior pole of the left kidney appear grossly stable compared to prior study. Scattered subcentimeter left renal hypodense lesions are too small to characterize. There is no hydronephrosis on either side. The urinary bladder is grossly unremarkable.  Reproductive: Hysterectomy.  Undo  Bowel and appendix: There is  extensive sigmoid diverticulosis. There is segmental thickening and inflammatory changes of the sigmoid colon compatible with diverticulitis. A 2.9 x 1.8 cm loculated fluid collection/abscess is noted lateral to the sigmoid colon in the left hemipelvis which is new from the study dated 12/19/2014 at appears similar to the abscess seen on the study dated 12/13/2014. Constipation no bowel obstruction. The appendix is unremarkable.  Vascular/Lymphatic: There is aortoiliac atherosclerotic disease. No lymphadenopathy.  Abdominal wall/Musculoskeletal: Degenerative changes of the spine. No acute fracture.  IMPRESSION: Sigmoid diverticulitis with diverticular abscess.  Constipation.  No bowel obstruction.   Electronically Signed   By: Anner Crete M.D.   On: 02/28/2015 16:06    ROS: See chart Blood pressure 108/57, pulse 84, temperature 98.7 F (37.1 C), temperature source Oral, resp. rate 18, height 5' 3" (1.6 m), weight 46.72 kg (103 lb), SpO2 93 %. Physical Exam: Pleasant white female in no acute distress. Abdomen is soft with tenderness in the suprapubic and left lower quadrant regions. No rigidity is noted. Minimal bowel sounds are appreciated.  Assessment/Plan: Impression: Recurrent sigmoid diverticulitis with contained perforation Plan: Given that the abscess is only 2 x 3 cm in size, radiologic drainage may not be beneficial at this time. Would continue IV antibiotics. Incidentally, patient had an episode of C. difficile colitis during a recent admission. I did talk about surgical options with the patient. Patient is very resistant to any surgical intervention. We'll follow with you.  Nahara Dona A 03/01/2015, 8:17 AM

## 2015-03-01 NOTE — Progress Notes (Signed)
[facility] PROGRESS NOTE  SAHVANNAH RIESER SVX:[id number] DOB: 27-Jun-1941 DOA: 02/28/2015 PCP: Glo Herring., MD  Assessment/Plan: 1. Acute sigmoid diverticulitis with abscess. Patient is on intravenous antibiotic. General surgery has seen the patient and does not feel that surgical intervention is needed at this time. Continue current antibiotic. Stool for C. difficile has been ordered, although she is not passing much stool. 2. COPD. appears stable. Respiratory status is at baseline. Continue supportive treatment. 3. Hyperlipidemia. Continue statin. 4. Chronic pain/fibromyalgia. Continue Neurontin and Norco. 5. Lower extremity edema related to venous insufficiency. Appears to be at baseline. Continue Lasix. 6. GERD. Continue proton pump inhibitors  Code Status: Full code Family Communication: Discussed with patient and husband at the bedside Disposition Plan: Discharge home once improved   Consultants:  Gen. surgery  Procedures:    Antibiotics:  Zosyn 6/29>>  HPI/Subjective: Had small bowel movement last night. No vomiting since admission. Continues to have crampy abdominal pain  Objective: Filed Vitals:   03/01/15 0758  BP:   Pulse: 84  Temp:   Resp:     Intake/Output Summary (Last 24 hours) at 03/01/15 0914 Last data filed at 03/01/15 0700  Gross per 24 hour  Intake 1470.83 ml  Output      0 ml  Net 1470.83 ml   Filed Weights   02/28/15 1851  Weight: 46.72 kg (103 lb)    Exam:   General:  NAD, laying in bed  Cardiovascular: s, s2, rrr  Respiratory: cta b  Abdomen: soft, diffusely tender more so in the periumbilical and lower abdomen, bs+  Musculoskeletal: no edema  Data Reviewed: Basic Metabolic Panel:  Recent Labs Lab 02/28/15 1229 03/01/15 0634  NA 139 138  K 3.7 3.4*  CL 94* 96*  CO2 34* 32  GLUCOSE 123* 92  BUN 25* 20  CREATININE 0.86 0.81  CALCIUM 9.2 8.6*   Liver Function Tests:  Recent Labs Lab 02/28/15 1229   AST 19  ALT 9*  ALKPHOS 80  BILITOT 0.6  PROT 7.3  ALBUMIN 3.1*    Recent Labs Lab 02/28/15 1229  LIPASE 24   No results for input(s): AMMONIA in the last 168 hours. CBC:  Recent Labs Lab 02/28/15 1229 03/01/15 0634  WBC 14.9* 13.2*  NEUTROABS 11.7*  --   HGB 12.6 11.1*  HCT 39.4 36.1  MCV 96.1 96.5  PLT 442* 472*   Cardiac Enzymes:  Recent Labs Lab 02/28/15 1229  TROPONINI <0.03   BNP (last 3 results)  Recent Labs  12/23/14 0515 02/14/15 0949  BNP 1234.6* 69.0    ProBNP (last 3 results) No results for input(s): PROBNP in the last 8760 hours.  CBG: No results for input(s): GLUCAP in the last 168 hours.  No results found for this or any previous visit (from the past 240 hour(s)).   Studies: Dg Chest 2 View  02/28/2015   CLINICAL DATA:  Hypotension with nausea and vomiting  EXAM: CHEST  2 VIEW  COMPARISON:  February 14, 2015  FINDINGS: Lungs are mildly hyperexpanded. There is no edema or consolidation. The heart size and pulmonary vascularity are normal. There is atherosclerotic change in the aortic arch. No bone lesions.  IMPRESSION: Lungs mildly hyperexpanded.  No edema or consolidation.   Electronically Signed   By: Lowella Grip III M.D.   On: 02/28/2015 13:25   Ct Abdomen Pelvis W Contrast  02/28/2015   CLINICAL DATA:  Abdominal pain, nausea vomiting x6 months worsened today. History of diverticulitis.  EXAM:  CT ABDOMEN AND PELVIS WITH CONTRAST  TECHNIQUE: Multidetector CT imaging of the abdomen and pelvis was performed using the standard protocol following bolus administration of intravenous contrast.  CONTRAST:  160mL OMNIPAQUE IOHEXOL 300 MG/ML  SOLN  COMPARISON:  CT dated 12/19/2014  FINDINGS: Lower chest: Mild emphysematous changes. There has been interval resolution of the previously seen bilateral pleural effusions and consolidative changes of the lung bases. A 6 mm right lung base nodular density as well as linear densities at the right lung base  due to the the new goals of go care for the likely represent residual scarring.  Peritoneum: No free air or free fluid.  Liver: Unremarkable.No intrahepatic biliary ductal dilatation.  Gallbladder: Unremarkable. No calcified gallstone. No pericholecystic fluid.  Pancreas: Unremarkable.No ductal dilatation.  Spleen: Unremarkable.  Adrenals glands: Unremarkable.  Kidneys, ureters, urinary bladder: There multiple bilateral renal cysts measuring up to 7 cm in the inferior pole of the left kidney appear grossly stable compared to prior study. Scattered subcentimeter left renal hypodense lesions are too small to characterize. There is no hydronephrosis on either side. The urinary bladder is grossly unremarkable.  Reproductive: Hysterectomy.  Undo  Bowel and appendix: There is extensive sigmoid diverticulosis. There is segmental thickening and inflammatory changes of the sigmoid colon compatible with diverticulitis. A 2.9 x 1.8 cm loculated fluid collection/abscess is noted lateral to the sigmoid colon in the left hemipelvis which is new from the study dated 12/19/2014 at appears similar to the abscess seen on the study dated 12/13/2014. Constipation no bowel obstruction. The appendix is unremarkable.  Vascular/Lymphatic: There is aortoiliac atherosclerotic disease. No lymphadenopathy.  Abdominal wall/Musculoskeletal: Degenerative changes of the spine. No acute fracture.  IMPRESSION: Sigmoid diverticulitis with diverticular abscess.  Constipation.  No bowel obstruction.   Electronically Signed   By: Anner Crete M.D.   On: 02/28/2015 16:06    Scheduled Meds: . aspirin  81 mg Oral Daily  . atorvastatin  80 mg Oral q1800  . budesonide-formoterol  2 puff Inhalation BID  . feeding supplement (ENSURE ENLIVE)  237 mL Oral BID BM  . furosemide  20 mg Oral Daily  . gabapentin  900 mg Oral TID  . heparin  5,000 Units Subcutaneous 3 times per day  . ipratropium-albuterol  3 mL Nebulization BID  . Linaclotide  145 mcg  Oral Daily  . pantoprazole  40 mg Oral Daily  . piperacillin-tazobactam (ZOSYN)  IV  3.375 g Intravenous Q8H  . potassium chloride  20 mEq Oral Daily  . saccharomyces boulardii  250 mg Oral BID  . senna  1 tablet Oral BID   Continuous Infusions: . sodium chloride      Principal Problem:   Colonic diverticular abscess Active Problems:   Constipation   COPD (chronic obstructive pulmonary disease)   Protein-calorie malnutrition, severe   Diverticulitis of intestine with abscess   Fibromyalgia   Leg edema   HLD (hyperlipidemia)   Sepsis    Time spent: 51mins    Shyenne Maggard  [facility] Pager [phone]. If 7PM-7AM, please contact night-coverage at [email or web address], password [facility] 03/01/2015, 9:14 AM  LOS: 1 day

## 2015-03-02 DIAGNOSIS — J9611 Chronic respiratory failure with hypoxia: Secondary | ICD-10-CM | POA: Diagnosis present

## 2015-03-02 DIAGNOSIS — K5780 Diverticulitis of intestine, part unspecified, with perforation and abscess without bleeding: Secondary | ICD-10-CM

## 2015-03-02 DIAGNOSIS — D6489 Other specified anemias: Secondary | ICD-10-CM

## 2015-03-02 LAB — BASIC METABOLIC PANEL
Anion gap: 10 (ref 5–15)
BUN: 17 mg/dL (ref 6–20)
CO2: 30 mmol/L (ref 22–32)
Calcium: 8.1 mg/dL — ABNORMAL LOW (ref 8.9–10.3)
Chloride: 100 mmol/L — ABNORMAL LOW (ref 101–111)
Creatinine, Ser: 0.7 mg/dL (ref 0.44–1.00)
GFR calc Af Amer: >60 mL/min (ref >60)
GFR calc non Af Amer: >60 mL/min (ref >60)
Glucose, Bld: 104 mg/dL — ABNORMAL HIGH (ref 65–99)
Potassium: 3.4 mmol/L — ABNORMAL LOW (ref 3.5–5.1)
Sodium: 140 mmol/L (ref 135–145)

## 2015-03-02 LAB — CBC
HCT: 31.7 % — ABNORMAL LOW (ref 36.0–46.0)
Hemoglobin: 10 g/dL — ABNORMAL LOW (ref 12.0–15.0)
MCH: 30.5 pg (ref 26.0–34.0)
MCHC: 31.5 g/dL (ref 30.0–36.0)
MCV: 96.6 fL (ref 78.0–100.0)
Platelets: 436 10*3/uL — ABNORMAL HIGH (ref 150–400)
RBC: 3.28 MIL/uL — ABNORMAL LOW (ref 3.87–5.11)
RDW: 14.8 % (ref 11.5–15.5)
WBC: 14 10*3/uL — ABNORMAL HIGH (ref 4.0–10.5)

## 2015-03-02 MED ORDER — POTASSIUM CHLORIDE CRYS ER 20 MEQ PO TBCR
40.0000 meq | EXTENDED_RELEASE_TABLET | Freq: Once | ORAL | Status: DC
  Filled 2015-03-02: qty 2

## 2015-03-02 NOTE — Progress Notes (Signed)
Patient has difficulty voiding.  Patient has attempted to void several times in night, and has only urinated approximately 25 cc each time.   Patient does complain of abdominal pain and states that she does have difficulty voiding at  Home. Called mid-level to get an order for an I&O cath.  I&O cath patient and received 200cc yellow urine.

## 2015-03-02 NOTE — Progress Notes (Signed)
Subjective: Still with lower abdominal pain, though easing. She is having difficulty voiding. She did have a bowel movement yesterday which was small with formed stools. She denies any diarrhea.  Objective: Vital signs in last 24 hours: Temp:  [98.5 F (36.9 C)-98.9 F (37.2 C)] 98.5 F (36.9 C) (07/01 0430) Pulse Rate:  [78-96] 96 (07/01 0430) Resp:  [16-18] 16 (07/01 0430) BP: (98-110)/(55-62) 104/55 mmHg (07/01 0430) SpO2:  [94 %-97 %] 95 % (07/01 0744) Last BM Date: 02/28/15 (per patient)  Intake/Output from previous day: 06/30 0701 - 07/01 0700 In: 1616.7 [P.O.:360; I.V.:1156.7; IV Piggyback:100] Out: 350 [Urine:350] Intake/Output this shift:    General appearance: alert, cooperative and no distress GI: Soft with some tenderness in the left lower quadrant and suprapubic region to palpation. No rigidity is noted.  Lab Results:   Recent Labs  03/01/15 0634 03/02/15 0623  WBC 13.2* 14.0*  HGB 11.1* 10.0*  HCT 36.1 31.7*  PLT 472* 436*   BMET  Recent Labs  03/01/15 0634 03/02/15 0623  NA 138 140  K 3.4* 3.4*  CL 96* 100*  CO2 32 30  GLUCOSE 92 104*  BUN 20 17  CREATININE 0.81 0.70  CALCIUM 8.6* 8.1*   PT/INR No results for input(s): LABPROT, INR in the last 72 hours.  Studies/Results: Dg Chest 2 View  02/28/2015   CLINICAL DATA:  Hypotension with nausea and vomiting  EXAM: CHEST  2 VIEW  COMPARISON:  February 14, 2015  FINDINGS: Lungs are mildly hyperexpanded. There is no edema or consolidation. The heart size and pulmonary vascularity are normal. There is atherosclerotic change in the aortic arch. No bone lesions.  IMPRESSION: Lungs mildly hyperexpanded.  No edema or consolidation.   Electronically Signed   By: Lowella Grip III M.D.   On: 02/28/2015 13:25   Ct Abdomen Pelvis W Contrast  02/28/2015   CLINICAL DATA:  Abdominal pain, nausea vomiting x6 months worsened today. History of diverticulitis.  EXAM: CT ABDOMEN AND PELVIS WITH CONTRAST   TECHNIQUE: Multidetector CT imaging of the abdomen and pelvis was performed using the standard protocol following bolus administration of intravenous contrast.  CONTRAST:  160mL OMNIPAQUE IOHEXOL 300 MG/ML  SOLN  COMPARISON:  CT dated 12/19/2014  FINDINGS: Lower chest: Mild emphysematous changes. There has been interval resolution of the previously seen bilateral pleural effusions and consolidative changes of the lung bases. A 6 mm right lung base nodular density as well as linear densities at the right lung base due to the the new goals of go care for the likely represent residual scarring.  Peritoneum: No free air or free fluid.  Liver: Unremarkable.No intrahepatic biliary ductal dilatation.  Gallbladder: Unremarkable. No calcified gallstone. No pericholecystic fluid.  Pancreas: Unremarkable.No ductal dilatation.  Spleen: Unremarkable.  Adrenals glands: Unremarkable.  Kidneys, ureters, urinary bladder: There multiple bilateral renal cysts measuring up to 7 cm in the inferior pole of the left kidney appear grossly stable compared to prior study. Scattered subcentimeter left renal hypodense lesions are too small to characterize. There is no hydronephrosis on either side. The urinary bladder is grossly unremarkable.  Reproductive: Hysterectomy.  Undo  Bowel and appendix: There is extensive sigmoid diverticulosis. There is segmental thickening and inflammatory changes of the sigmoid colon compatible with diverticulitis. A 2.9 x 1.8 cm loculated fluid collection/abscess is noted lateral to the sigmoid colon in the left hemipelvis which is new from the study dated 12/19/2014 at appears similar to the abscess seen on the study dated 12/13/2014. Constipation  no bowel obstruction. The appendix is unremarkable.  Vascular/Lymphatic: There is aortoiliac atherosclerotic disease. No lymphadenopathy.  Abdominal wall/Musculoskeletal: Degenerative changes of the spine. No acute fracture.  IMPRESSION: Sigmoid diverticulitis with  diverticular abscess.  Constipation.  No bowel obstruction.   Electronically Signed   By: Anner Crete M.D.   On: 02/28/2015 16:06    Anti-infectives: Anti-infectives    Start     Dose/Rate Route Frequency Ordered Stop   02/28/15 1900  piperacillin-tazobactam (ZOSYN) IVPB 3.375 g     3.375 g 12.5 mL/hr over 240 Minutes Intravenous Every 8 hours 02/28/15 1857        Assessment/Plan: Impression: Recurrent sigmoid diverticulitis with contained perforation. Patient has regular diet at present time. Plan: Would continue IV antibiotics. Should the patient's leukocytosis not resolve over the next few days, would repeat CT scan to assess the abscess cavity. May need percutaneous drainage if the abscess cavity is bigger or not resolving. Please call [facility] if further surgery consultation as needed.  LOS: 2 days    Harlan Ervine A 03/02/2015

## 2015-03-02 NOTE — Progress Notes (Signed)
[facility] PROGRESS NOTE  GENOVA KINER YJE:[id number] DOB: August 03, 1941 DOA: 02/28/2015 PCP: Glo Herring., MD  Assessment/Plan: 1. Acute sigmoid diverticulitis with abscess. Patient is on intravenous antibiotic. General surgery has seen the patient and does not feel that surgical intervention is needed at this time. Plan is to continue current antibiotic. If patient fails to improve, will likely need repeat CT in the next 1-2 days to monitor for progression of abscess.. 2. COPD with chronic respiratory failure on 3 L of oxygen. appears stable. Respiratory status is at baseline. Continue supportive treatment. 3. Hyperlipidemia. Continue statin. 4. Chronic pain/fibromyalgia. Continue Neurontin and Norco. 5. GERD. Continue proton pump inhibitors 6. Hypokalemia. Replace 7. Anemia, likely dilutional. No evidence of bleeding. Continue to monitor  Code Status: Full code Family Communication: Discussed with patient and husband at the bedside Disposition Plan: Discharge home once improved   Consultants:  Gen. surgery  Procedures:    Antibiotics:  Zosyn 6/29>>  HPI/Subjective: Had a large bowel movement with suppository today. Feeling better. Abdominal pain is improving, although she still having crampy pain in the left lower quadrant.  Objective: Filed Vitals:   03/02/15 1413  BP: 101/50  Pulse: 75  Temp: 98.1 F (36.7 C)  Resp: 18    Intake/Output Summary (Last 24 hours) at 03/02/15 1837 Last data filed at 03/02/15 1048  Gross per 24 hour  Intake 1206.67 ml  Output    350 ml  Net 856.67 ml   Filed Weights   02/28/15 1851  Weight: 46.72 kg (103 lb)    Exam:   General:  NAD, laying in bed  Cardiovascular: s, s2, regular rate  Respiratory: Clear bilaterally  Abdomen: soft, diffusely tender more so in the periumbilical and lower abdomen, bs+  Musculoskeletal: no edema  Data Reviewed: Basic Metabolic Panel:  Recent Labs Lab 02/28/15 1229  03/01/15 0634 03/02/15 0623  NA 139 138 140  K 3.7 3.4* 3.4*  CL 94* 96* 100*  CO2 34* 32 30  GLUCOSE 123* 92 104*  BUN 25* 20 17  CREATININE 0.86 0.81 0.70  CALCIUM 9.2 8.6* 8.1*   Liver Function Tests:  Recent Labs Lab 02/28/15 1229  AST 19  ALT 9*  ALKPHOS 80  BILITOT 0.6  PROT 7.3  ALBUMIN 3.1*    Recent Labs Lab 02/28/15 1229  LIPASE 24   No results for input(s): AMMONIA in the last 168 hours. CBC:  Recent Labs Lab 02/28/15 1229 03/01/15 0634 03/02/15 0623  WBC 14.9* 13.2* 14.0*  NEUTROABS 11.7*  --   --   HGB 12.6 11.1* 10.0*  HCT 39.4 36.1 31.7*  MCV 96.1 96.5 96.6  PLT 442* 472* 436*   Cardiac Enzymes:  Recent Labs Lab 02/28/15 1229  TROPONINI <0.03   BNP (last 3 results)  Recent Labs  12/23/14 0515 02/14/15 0949  BNP 1234.6* 69.0    ProBNP (last 3 results) No results for input(s): PROBNP in the last 8760 hours.  CBG: No results for input(s): GLUCAP in the last 168 hours.  No results found for this or any previous visit (from the past 240 hour(s)).   Studies: No results found.  Scheduled Meds: . aspirin  81 mg Oral Daily  . atorvastatin  80 mg Oral q1800  . budesonide-formoterol  2 puff Inhalation BID  . feeding supplement (ENSURE ENLIVE)  237 mL Oral BID BM  . gabapentin  900 mg Oral TID  . heparin  5,000 Units Subcutaneous 3 times per day  . ipratropium-albuterol  3 mL Nebulization BID  . pantoprazole  40 mg Oral Daily  . piperacillin-tazobactam (ZOSYN)  IV  3.375 g Intravenous Q8H  . potassium chloride  20 mEq Oral Daily  . saccharomyces boulardii  250 mg Oral BID  . senna  1 tablet Oral BID   Continuous Infusions: . sodium chloride 100 mL/hr at 03/01/15 1726    Principal Problem:   Colonic diverticular abscess Active Problems:   Constipation   COPD (chronic obstructive pulmonary disease)   Protein-calorie malnutrition, severe   Diverticulitis of intestine with abscess   Fibromyalgia   Leg edema   HLD  (hyperlipidemia)    Time spent: 23mins    Toi Stelly  [facility] Pager [phone]. If 7PM-7AM, please contact night-coverage at [email or web address], password [facility] 03/02/2015, 6:37 PM  LOS: 2 days

## 2015-03-02 NOTE — Clinical Documentation Improvement (Signed)
03/01/2015 MD progress note contains 'sepsis' under active problems;  Sepsis also added to current hospital problem list same date - 03/01/15.  Sepsis not mentioned in other notes.  Please clarify if sepsis is present this admission and if so, please clarify if it was present on admission or developed after admission.    Thank you, Mateo Flow, RN [phone] Clinical Documentation Specialist

## 2015-03-02 NOTE — Progress Notes (Signed)
ANTIBIOTIC CONSULT NOTE - FOLLOW UP  Pharmacy Consult for Zosyn  Indication: Intra-abdominal Infection, diverticular abscess   Allergies  Allergen Reactions  . Codeine Other (See Comments)    Makes patient not in right state of mind.   . Oxycodone Itching  . Talwin [Pentazocine] Other (See Comments)    Makes patient not in right state of mind.   . Valium [Diazepam] Other (See Comments)    Just doesn't work.    Patient Measurements: Height: 5\' 3"  (160 cm) Weight: 103 lb (46.72 kg) IBW/kg (Calculated) : 52.4  Vital Signs: Temp: 98.5 F (36.9 C) (07/01 0430) Temp Source: Oral (07/01 0430) BP: 104/55 mmHg (07/01 0430) Pulse Rate: 96 (07/01 0430) Intake/Output from previous day: 06/30 0701 - 07/01 0700 In: 1616.7 [P.O.:360; I.V.:1156.7; IV Piggyback:100] Out: 350 [Urine:350] Intake/Output from this shift:    Labs:  Recent Labs  02/28/15 1229 03/01/15 0634 03/02/15 0623  WBC 14.9* 13.2* 14.0*  HGB 12.6 11.1* 10.0*  PLT 442* 472* 436*  CREATININE 0.86 0.81 0.70   Estimated Creatinine Clearance: 47.6 mL/min (by C-G formula based on Cr of 0.7). No results for input(s): VANCOTROUGH, VANCOPEAK, VANCORANDOM, GENTTROUGH, GENTPEAK, GENTRANDOM, TOBRATROUGH, TOBRAPEAK, TOBRARND, AMIKACINPEAK, AMIKACINTROU, AMIKACIN in the last 72 hours.   Microbiology: Recent Results (from the past 720 hour(s))  Urine culture     Status: None   Collection Time: 02/14/15 10:10 AM  Result Value Ref Range Status   Specimen Description URINE, CLEAN CATCH  Final   Special Requests NONE  Final   Culture   Final    30,000 COLONIES/mL DIPHTHEROIDS(CORYNEBACTERIUM SPECIES) 2,000 COLONIES/mL GRAM POSITIVE COCCI Performed at [facility]    Report Status 02/18/2015 FINAL  Final    Anti-infectives    Start     Dose/Rate Route Frequency Ordered Stop   02/28/15 1900  piperacillin-tazobactam (ZOSYN) IVPB 3.375 g     3.375 g 12.5 mL/hr over 240 Minutes Intravenous Every 8 hours 02/28/15  1857       Assessment: Okay for Protocol, renal function @ baseline.  SCr stable.  Goal of Therapy:  Eradicate infection.  Plan:  Zosyn 3.375gm IV every 8 hours. Follow-up micro data, labs, vitals.  [place] Crane, Yanil Dawe A 03/02/2015,11:14 AM

## 2015-03-02 NOTE — Care Management (Signed)
Important Message  Patient Details  Name: Lindsay Mitchell MRN: [id number] Date of Birth: 1941/03/05   Medicare Important Message Given:  Yes-second notification given    Sherald Barge, RN 03/02/2015, 3:59 PM

## 2015-03-02 NOTE — Progress Notes (Signed)
Patient had large formed BM after receiving dulcolax suppository.

## 2015-03-03 DIAGNOSIS — K5700 Diverticulitis of small intestine with perforation and abscess without bleeding: Secondary | ICD-10-CM

## 2015-03-03 DIAGNOSIS — D649 Anemia, unspecified: Secondary | ICD-10-CM

## 2015-03-03 LAB — BASIC METABOLIC PANEL
Anion gap: 8 (ref 5–15)
BUN: 12 mg/dL (ref 6–20)
CO2: 31 mmol/L (ref 22–32)
Calcium: 8.7 mg/dL — ABNORMAL LOW (ref 8.9–10.3)
Chloride: 98 mmol/L — ABNORMAL LOW (ref 101–111)
Creatinine, Ser: 0.74 mg/dL (ref 0.44–1.00)
GFR calc Af Amer: >60 mL/min (ref >60)
GFR calc non Af Amer: >60 mL/min (ref >60)
Glucose, Bld: 86 mg/dL (ref 65–99)
Potassium: 4.4 mmol/L (ref 3.5–5.1)
Sodium: 137 mmol/L (ref 135–145)

## 2015-03-03 LAB — CBC
HCT: 32.8 % — ABNORMAL LOW (ref 36.0–46.0)
Hemoglobin: 10.3 g/dL — ABNORMAL LOW (ref 12.0–15.0)
MCH: 30.5 pg (ref 26.0–34.0)
MCHC: 31.4 g/dL (ref 30.0–36.0)
MCV: 97 fL (ref 78.0–100.0)
Platelets: 438 10*3/uL — ABNORMAL HIGH (ref 150–400)
RBC: 3.38 MIL/uL — ABNORMAL LOW (ref 3.87–5.11)
RDW: 15 % (ref 11.5–15.5)
WBC: 12.4 10*3/uL — ABNORMAL HIGH (ref 4.0–10.5)

## 2015-03-03 LAB — MAGNESIUM: Magnesium: 1.9 mg/dL (ref 1.7–2.4)

## 2015-03-03 NOTE — Progress Notes (Signed)
Patient ambulated to bathroom with assistance. Patient states she has periods of dizziness for a couple of days.

## 2015-03-03 NOTE — Progress Notes (Signed)
[facility] PROGRESS NOTE  Lindsay Mitchell VHQ:[id number] DOB: Jun 10, 1941 DOA: 02/28/2015 PCP: Lindsay Mitchell., MD  Assessment/Plan: 1. Acute sigmoid diverticulitis with abscess. Patient is on intravenous antibiotic. General surgery has seen the patient and does not feel that surgical intervention is needed at this time. Plan is to continue current antibiotic. Leukocytosis is improving. He needs a repeat CT scan in the next 24-48 hours to ensure improvement of abscess. 2. COPD with chronic respiratory failure on 3 L of oxygen. appears stable. Respiratory status is at baseline. Continue supportive treatment. 3. Hyperlipidemia. Continue statin. 4. Chronic pain/fibromyalgia. Continue Neurontin and Norco. 5. GERD. Continue proton pump inhibitors 6. Hypokalemia. Improved. 7. Anemia, likely dilutional. No evidence of bleeding. Continue to monitor  Code Status: Full code Family Communication: Discussed with patient and husband at the bedside Disposition Plan: Discharge home once improved   Consultants:  Gen. surgery  Procedures:    Antibiotics:  Zosyn 6/29>>  HPI/Subjective: Patient continues to have bowel movements. Her abdominal pain is improving. No nausea or vomiting.  Objective: Filed Vitals:   03/03/15 1537  BP: 94/50  Pulse: 72  Temp: 98.1 F (36.7 C)  Resp: 20    Intake/Output Summary (Last 24 hours) at 03/03/15 1653 Last data filed at 03/03/15 1500  Gross per 24 hour  Intake    850 ml  Output      0 ml  Net    850 ml   Filed Weights   02/28/15 1851  Weight: 46.72 kg (103 lb)    Exam:   General:  NAD, laying in bed  Cardiovascular: s, s2, RRR  Respiratory: CTA B  Abdomen: soft, abdominal tenderness improving, bs+  Musculoskeletal: no edema  Data Reviewed: Basic Metabolic Panel:  Recent Labs Lab 02/28/15 1229 03/01/15 0634 03/02/15 0623 03/03/15 0608  NA 139 138 140 137  K 3.7 3.4* 3.4* 4.4  CL 94* 96* 100* 98*  CO2 34* 32 30 31   GLUCOSE 123* 92 104* 86  BUN 25* 20 17 12   CREATININE 0.86 0.81 0.70 0.74  CALCIUM 9.2 8.6* 8.1* 8.7*  MG  --   --   --  1.9   Liver Function Tests:  Recent Labs Lab 02/28/15 1229  AST 19  ALT 9*  ALKPHOS 80  BILITOT 0.6  PROT 7.3  ALBUMIN 3.1*    Recent Labs Lab 02/28/15 1229  LIPASE 24   No results for input(s): AMMONIA in the last 168 hours. CBC:  Recent Labs Lab 02/28/15 1229 03/01/15 0634 03/02/15 0623 03/03/15 0608  WBC 14.9* 13.2* 14.0* 12.4*  NEUTROABS 11.7*  --   --   --   HGB 12.6 11.1* 10.0* 10.3*  HCT 39.4 36.1 31.7* 32.8*  MCV 96.1 96.5 96.6 97.0  PLT 442* 472* 436* 438*   Cardiac Enzymes:  Recent Labs Lab 02/28/15 1229  TROPONINI <0.03   BNP (last 3 results)  Recent Labs  12/23/14 0515 02/14/15 0949  BNP 1234.6* 69.0    ProBNP (last 3 results) No results for input(s): PROBNP in the last 8760 hours.  CBG: No results for input(s): GLUCAP in the last 168 hours.  No results found for this or any previous visit (from the past 240 hour(s)).   Studies: No results found.  Scheduled Meds: . aspirin  81 mg Oral Daily  . atorvastatin  80 mg Oral q1800  . budesonide-formoterol  2 puff Inhalation BID  . feeding supplement (ENSURE ENLIVE)  237 mL Oral BID BM  . gabapentin  900 mg Oral TID  . heparin  5,000 Units Subcutaneous 3 times per day  . ipratropium-albuterol  3 mL Nebulization BID  . pantoprazole  40 mg Oral Daily  . piperacillin-tazobactam (ZOSYN)  IV  3.375 g Intravenous Q8H  . potassium chloride  20 mEq Oral Daily  . potassium chloride  40 mEq Oral Once  . saccharomyces boulardii  250 mg Oral BID  . senna  1 tablet Oral BID   Continuous Infusions: . sodium chloride 100 mL/hr at 03/03/15 1651    Principal Problem:   Diverticulitis of intestine with abscess Active Problems:   Constipation   COPD (chronic obstructive pulmonary disease)   Colonic diverticular abscess   Protein-calorie malnutrition, severe   Anemia    Fibromyalgia   Leg edema   HLD (hyperlipidemia)   Chronic respiratory failure with hypoxia    Time spent: 50mins    Gar Glance  [facility] Pager [phone]. If 7PM-7AM, please contact night-coverage at [email or web address], password [email or web address] [facility] 03/03/2015, 4:53 PM  LOS: 3 days

## 2015-03-04 DIAGNOSIS — I951 Orthostatic hypotension: Secondary | ICD-10-CM | POA: Diagnosis not present

## 2015-03-04 LAB — CBC
HCT: 33 % — ABNORMAL LOW (ref 36.0–46.0)
Hemoglobin: 10.2 g/dL — ABNORMAL LOW (ref 12.0–15.0)
MCH: 30.2 pg (ref 26.0–34.0)
MCHC: 30.9 g/dL (ref 30.0–36.0)
MCV: 97.6 fL (ref 78.0–100.0)
Platelets: 458 10*3/uL — ABNORMAL HIGH (ref 150–400)
RBC: 3.38 MIL/uL — ABNORMAL LOW (ref 3.87–5.11)
RDW: 14.9 % (ref 11.5–15.5)
WBC: 11.2 10*3/uL — ABNORMAL HIGH (ref 4.0–10.5)

## 2015-03-04 MED ORDER — MIDODRINE HCL 5 MG PO TABS
5.0000 mg | ORAL_TABLET | Freq: Three times a day (TID) | ORAL | Status: DC
  Administered 2015-03-04 – 2015-03-08 (×9): 5 mg via ORAL
  Filled 2015-03-04 (×9): qty 1

## 2015-03-04 MED ORDER — PROMETHAZINE HCL 25 MG/ML IJ SOLN
12.5000 mg | Freq: Four times a day (QID) | INTRAMUSCULAR | Status: DC | PRN
  Administered 2015-03-04 – 2015-03-07 (×6): 12.5 mg via INTRAVENOUS
  Filled 2015-03-04 (×6): qty 1

## 2015-03-04 MED ORDER — DICYCLOMINE HCL 10 MG PO CAPS
20.0000 mg | ORAL_CAPSULE | Freq: Three times a day (TID) | ORAL | Status: DC
  Administered 2015-03-04 – 2015-03-08 (×10): 20 mg via ORAL
  Filled 2015-03-04 (×10): qty 2

## 2015-03-04 NOTE — Progress Notes (Signed)
[facility] PROGRESS NOTE  HANNI MILFORD JKD:[id number] DOB: [birth year] DOA: 02/28/2015 PCP: Glo Herring., MD  Assessment/Plan: 1. Acute sigmoid diverticulitis with abscess. Patient is on intravenous antibiotic. General surgery has seen the patient and does not feel that surgical intervention is needed at this time. Plan is to continue current antibiotic. Leukocytosis is improving. Repeat CT scan tomorrow to ensure improvement of abscess. 2. COPD with chronic respiratory failure on 3 L of oxygen. appears stable. Respiratory status is at baseline. Continue supportive treatment. 3. Hyperlipidemia. Continue statin. 4. Chronic pain/fibromyalgia. Continue Neurontin and Norco. 5. GERD. Continue proton pump inhibitors 6. Hypokalemia. Improved. 7. Anemia, likely dilutional. No evidence of bleeding. Hemoglobin is stable. Continue to monitor 8. Orthostatic hypotension. Will continue with IV fluids. TSH is near normal. Will check cortisol. Start midodrine. 9. Diarrhea. Patient has now developed some loose stools. I suspect she may have some underlying irritable bowel syndrome. Will give a trial of bentyl.  Code Status: Full code Family Communication: Discussed with patient and husband at the bedside Disposition Plan: Discharge home once improved   Consultants:  Gen. surgery  Procedures:    Antibiotics:  Zosyn 6/29>>  HPI/Subjective: Developed 2 loose stools this morning. Complaining of abdominal cramping. Has nausea and unable to eat. Dizzy on standing  Objective: Filed Vitals:   03/04/15 0526  BP: 100/45  Pulse: 77  Temp: 98.8 F (37.1 C)  Resp: 20    Intake/Output Summary (Last 24 hours) at 03/04/15 1109 Last data filed at 03/04/15 0800  Gross per 24 hour  Intake   1090 ml  Output      0 ml  Net   1090 ml   Filed Weights   02/28/15 1851  Weight: 46.72 kg (103 lb)    Exam:   General:  NAD, laying in bed  Cardiovascular: s, s2, RRR  Respiratory: CTA  B  Abdomen: soft, abdominal tenderness improving, bs+  Musculoskeletal: no edema  Data Reviewed: Basic Metabolic Panel:  Recent Labs Lab 02/28/15 1229 03/01/15 0634 03/02/15 0623 03/03/15 0608  NA 139 138 140 137  K 3.7 3.4* 3.4* 4.4  CL 94* 96* 100* 98*  CO2 34* 32 30 31  GLUCOSE 123* 92 104* 86  BUN 25* 20 17 12   CREATININE 0.86 0.81 0.70 0.74  CALCIUM 9.2 8.6* 8.1* 8.7*  MG  --   --   --  1.9   Liver Function Tests:  Recent Labs Lab 02/28/15 1229  AST 19  ALT 9*  ALKPHOS 80  BILITOT 0.6  PROT 7.3  ALBUMIN 3.1*    Recent Labs Lab 02/28/15 1229  LIPASE 24   No results for input(s): AMMONIA in the last 168 hours. CBC:  Recent Labs Lab 02/28/15 1229 03/01/15 0634 03/02/15 0623 03/03/15 0608 03/04/15 0605  WBC 14.9* 13.2* 14.0* 12.4* 11.2*  NEUTROABS 11.7*  --   --   --   --   HGB 12.6 11.1* 10.0* 10.3* 10.2*  HCT 39.4 36.1 31.7* 32.8* 33.0*  MCV 96.1 96.5 96.6 97.0 97.6  PLT 442* 472* 436* 438* 458*   Cardiac Enzymes:  Recent Labs Lab 02/28/15 1229  TROPONINI <0.03   BNP (last 3 results)  Recent Labs  12/23/14 0515 02/14/15 0949  BNP 1234.6* 69.0    ProBNP (last 3 results) No results for input(s): PROBNP in the last 8760 hours.  CBG: No results for input(s): GLUCAP in the last 168 hours.  No results found for this or any previous visit (from the  past 240 hour(s)).   Studies: No results found.  Scheduled Meds: . aspirin  81 mg Oral Daily  . atorvastatin  80 mg Oral q1800  . budesonide-formoterol  2 puff Inhalation BID  . dicyclomine  20 mg Oral TID AC  . feeding supplement (ENSURE ENLIVE)  237 mL Oral BID BM  . gabapentin  900 mg Oral TID  . heparin  5,000 Units Subcutaneous 3 times per day  . ipratropium-albuterol  3 mL Nebulization BID  . pantoprazole  40 mg Oral Daily  . piperacillin-tazobactam (ZOSYN)  IV  3.375 g Intravenous Q8H  . potassium chloride  20 mEq Oral Daily  . potassium chloride  40 mEq Oral Once  .  saccharomyces boulardii  250 mg Oral BID  . senna  1 tablet Oral BID   Continuous Infusions: . sodium chloride 75 mL/hr at 03/03/15 1757    Principal Problem:   Diverticulitis of intestine with abscess Active Problems:   Constipation   COPD (chronic obstructive pulmonary disease)   Colonic diverticular abscess   Protein-calorie malnutrition, severe   Anemia   Fibromyalgia   Leg edema   HLD (hyperlipidemia)   Chronic respiratory failure with hypoxia    Time spent: 53mins    Rashonda Warrior  [facility] Pager [phone]. If 7PM-7AM, please contact night-coverage at [email or web address], password [facility] 03/04/2015, 11:09 AM  LOS: 4 days

## 2015-03-05 ENCOUNTER — Inpatient Hospital Stay (HOSPITAL_COMMUNITY): Payer: Medicare Other

## 2015-03-05 DIAGNOSIS — E2740 Unspecified adrenocortical insufficiency: Secondary | ICD-10-CM | POA: Diagnosis present

## 2015-03-05 LAB — CBC
HCT: 32.7 % — ABNORMAL LOW (ref 36.0–46.0)
Hemoglobin: 10.2 g/dL — ABNORMAL LOW (ref 12.0–15.0)
MCH: 30.5 pg (ref 26.0–34.0)
MCHC: 31.2 g/dL (ref 30.0–36.0)
MCV: 97.9 fL (ref 78.0–100.0)
Platelets: 416 10*3/uL — ABNORMAL HIGH (ref 150–400)
RBC: 3.34 MIL/uL — ABNORMAL LOW (ref 3.87–5.11)
RDW: 14.9 % (ref 11.5–15.5)
WBC: 8.1 10*3/uL (ref 4.0–10.5)

## 2015-03-05 LAB — BASIC METABOLIC PANEL
Anion gap: 6 (ref 5–15)
BUN: 14 mg/dL (ref 6–20)
CO2: 34 mmol/L — ABNORMAL HIGH (ref 22–32)
Calcium: 8.6 mg/dL — ABNORMAL LOW (ref 8.9–10.3)
Chloride: 99 mmol/L — ABNORMAL LOW (ref 101–111)
Creatinine, Ser: 0.78 mg/dL (ref 0.44–1.00)
GFR calc Af Amer: >60 mL/min (ref >60)
GFR calc non Af Amer: >60 mL/min (ref >60)
Glucose, Bld: 98 mg/dL (ref 65–99)
Potassium: 4.3 mmol/L (ref 3.5–5.1)
Sodium: 139 mmol/L (ref 135–145)

## 2015-03-05 LAB — CORTISOL-AM, BLOOD: Cortisol - AM: 3.4 ug/dL — ABNORMAL LOW (ref 6.7–22.6)

## 2015-03-05 MED ORDER — PREDNISONE 10 MG PO TABS
5.0000 mg | ORAL_TABLET | Freq: Every day | ORAL | Status: DC
  Administered 2015-03-05 – 2015-03-08 (×3): 5 mg via ORAL
  Filled 2015-03-05 (×3): qty 1

## 2015-03-05 MED ORDER — IOHEXOL 300 MG/ML  SOLN
100.0000 mL | Freq: Once | INTRAMUSCULAR | Status: AC | PRN
  Administered 2015-03-05: 100 mL via INTRAVENOUS

## 2015-03-05 NOTE — Progress Notes (Signed)
[facility] PROGRESS NOTE  Lindsay Mitchell:[id number] DOB: [birth year] DOA: 02/28/2015 PCP: Glo Herring., MD  Assessment/Plan: 1. Acute sigmoid diverticulitis with abscess. Patient is on intravenous antibiotic. General surgery has seen the patient and does not feel that surgical intervention is needed at this time. Plan is to continue current antibiotic. Leukocytosis is improving. Repeat CT scan shows persistent abscess. Will discuss with interventional radiology tomorrow regarding possible drain placement. 2. COPD with chronic respiratory failure on 3 L of oxygen. appears stable. Respiratory status is at baseline. Continue supportive treatment. 3. Hyperlipidemia. Continue statin. 4. Chronic pain/fibromyalgia. Continue Neurontin and Norco. 5. GERD. Continue proton pump inhibitors 6. Hypokalemia. Improved. 7. Anemia, likely dilutional. No evidence of bleeding. Hemoglobin is stable. Continue to monitor 8. Adrenal insufficiency. Patient was noted to be significantly orthostatic despite IV hydration, losing weight, irregular bowel movement and generalized weakness and fatigue. Cortisol checked and found to be low. Will start the patient on low dose prednisone. 9. Diarrhea. Improving. Continue current treatments.  Code Status: Full code Family Communication: Discussed with patient and husband at the bedside Disposition Plan: Discharge home once improved   Consultants:  Gen. surgery  Procedures:    Antibiotics:  Zosyn 6/29>>  HPI/Subjective: Diarrhea is improving. Abdominal pain is improving. Anti emetics helping with nausea and she is able to keep down more food. Still dizzy on standing.  Objective: Filed Vitals:   03/05/15 1333  BP: 102/48  Pulse: 65  Temp: 98.2 F (36.8 C)  Resp: 20    Intake/Output Summary (Last 24 hours) at 03/05/15 1759 Last data filed at 03/05/15 1500  Gross per 24 hour  Intake    420 ml  Output   1500 ml  Net  -1080 ml   Filed  Weights   02/28/15 1851  Weight: 46.72 kg (103 lb)    Exam:   General:  NAD, laying in bed  Cardiovascular: s, s2, RRR  Respiratory: CTA B  Abdomen: soft, abdominal tenderness improving, bs+  Musculoskeletal: no edema  Data Reviewed: Basic Metabolic Panel:  Recent Labs Lab 02/28/15 1229 03/01/15 0634 03/02/15 0623 03/03/15 0608 03/05/15 0612  NA 139 138 140 137 139  K 3.7 3.4* 3.4* 4.4 4.3  CL 94* 96* 100* 98* 99*  CO2 34* 32 30 31 34*  GLUCOSE 123* 92 104* 86 98  BUN 25* 20 17 12 14   CREATININE 0.86 0.81 0.70 0.74 0.78  CALCIUM 9.2 8.6* 8.1* 8.7* 8.6*  MG  --   --   --  1.9  --    Liver Function Tests:  Recent Labs Lab 02/28/15 1229  AST 19  ALT 9*  ALKPHOS 80  BILITOT 0.6  PROT 7.3  ALBUMIN 3.1*    Recent Labs Lab 02/28/15 1229  LIPASE 24   No results for input(s): AMMONIA in the last 168 hours. CBC:  Recent Labs Lab 02/28/15 1229 03/01/15 0634 03/02/15 0623 03/03/15 0608 03/04/15 0605 03/05/15 0612  WBC 14.9* 13.2* 14.0* 12.4* 11.2* 8.1  NEUTROABS 11.7*  --   --   --   --   --   HGB 12.6 11.1* 10.0* 10.3* 10.2* 10.2*  HCT 39.4 36.1 31.7* 32.8* 33.0* 32.7*  MCV 96.1 96.5 96.6 97.0 97.6 97.9  PLT 442* 472* 436* 438* 458* 416*   Cardiac Enzymes:  Recent Labs Lab 02/28/15 1229  TROPONINI <0.03   BNP (last 3 results)  Recent Labs  12/23/14 0515 02/14/15 0949  BNP 1234.6* 69.0    ProBNP (last 3  results) No results for input(s): PROBNP in the last 8760 hours.  CBG: No results for input(s): GLUCAP in the last 168 hours.  No results found for this or any previous visit (from the past 240 hour(s)).   Studies: No results found.  Scheduled Meds: . aspirin  81 mg Oral Daily  . atorvastatin  80 mg Oral q1800  . budesonide-formoterol  2 puff Inhalation BID  . dicyclomine  20 mg Oral TID AC  . feeding supplement (ENSURE ENLIVE)  237 mL Oral BID BM  . gabapentin  900 mg Oral TID  . heparin  5,000 Units Subcutaneous 3 times  per day  . ipratropium-albuterol  3 mL Nebulization BID  . midodrine  5 mg Oral TID WC  . pantoprazole  40 mg Oral Daily  . piperacillin-tazobactam (ZOSYN)  IV  3.375 g Intravenous Q8H  . potassium chloride  20 mEq Oral Daily  . potassium chloride  40 mEq Oral Once  . predniSONE  5 mg Oral Q breakfast  . saccharomyces boulardii  250 mg Oral BID  . senna  1 tablet Oral BID   Continuous Infusions: . sodium chloride 75 mL/hr at 03/05/15 1005    Principal Problem:   Diverticulitis of intestine with abscess Active Problems:   Constipation   COPD (chronic obstructive pulmonary disease)   Colonic diverticular abscess   Protein-calorie malnutrition, severe   Anemia   Fibromyalgia   Leg edema   HLD (hyperlipidemia)   Chronic respiratory failure with hypoxia   Orthostatic hypotension   Adrenal insufficiency    Time spent: 67mins    [name]  [facility] Pager [phone]. If 7PM-7AM, please contact night-coverage at [email or web address], password [facility] 03/05/2015, 5:59 PM  LOS: 5 days

## 2015-03-06 LAB — CBC
HCT: 30.5 % — ABNORMAL LOW (ref 36.0–46.0)
Hemoglobin: 9.5 g/dL — ABNORMAL LOW (ref 12.0–15.0)
MCH: 30.2 pg (ref 26.0–34.0)
MCHC: 31.1 g/dL (ref 30.0–36.0)
MCV: 96.8 fL (ref 78.0–100.0)
Platelets: 423 10*3/uL — ABNORMAL HIGH (ref 150–400)
RBC: 3.15 MIL/uL — ABNORMAL LOW (ref 3.87–5.11)
RDW: 14.9 % (ref 11.5–15.5)
WBC: 6.2 10*3/uL (ref 4.0–10.5)

## 2015-03-06 LAB — BASIC METABOLIC PANEL
Anion gap: 9 (ref 5–15)
BUN: 13 mg/dL (ref 6–20)
CO2: 31 mmol/L (ref 22–32)
Calcium: 8.5 mg/dL — ABNORMAL LOW (ref 8.9–10.3)
Chloride: 99 mmol/L — ABNORMAL LOW (ref 101–111)
Creatinine, Ser: 0.72 mg/dL (ref 0.44–1.00)
GFR calc Af Amer: >60 mL/min (ref >60)
GFR calc non Af Amer: >60 mL/min (ref >60)
Glucose, Bld: 139 mg/dL — ABNORMAL HIGH (ref 65–99)
Potassium: 4.1 mmol/L (ref 3.5–5.1)
Sodium: 139 mmol/L (ref 135–145)

## 2015-03-06 LAB — PROTIME-INR
INR: 1.1 (ref 0.00–1.49)
Prothrombin Time: 14.4 seconds (ref 11.6–15.2)

## 2015-03-06 LAB — APTT: aPTT: 70 seconds — ABNORMAL HIGH (ref 24–37)

## 2015-03-06 MED ORDER — HEPARIN SODIUM (PORCINE) 5000 UNIT/ML IJ SOLN
5000.0000 [IU] | Freq: Three times a day (TID) | INTRAMUSCULAR | Status: DC
  Administered 2015-03-07 – 2015-03-08 (×3): 5000 [IU] via SUBCUTANEOUS
  Filled 2015-03-06 (×4): qty 1

## 2015-03-06 NOTE — Progress Notes (Signed)
Patient ID: Lindsay Mitchell, female   DOB: 1941-01-05, 74 y.o.   MRN: [id number]   Diverticular abscess Request for drain placement per Dr Roderic Palau  Dr Pascal Lux has reviewed imaging and approves procedure  Scheduled for 7/6 am at [facility] See orders regarding time And HOLD Hep injections  Pt will return to [facility] after procedure

## 2015-03-06 NOTE — Plan of Care (Signed)
Problem: Phase I Progression Outcomes Goal: Hemodynamically stable Outcome: Progressing BP running soft 95/43 was the last reading. All other vitals are within normal range.

## 2015-03-06 NOTE — Plan of Care (Deleted)
Problem: Acute Treatment Outcomes Goal: BP within ordered parameters Outcome: Progressing See flowsheet Goal: 02 Sats > 94% Outcome: Progressing See flowsheet Goal: Hemodynamically stable Outcome: Progressing See flowsheet

## 2015-03-06 NOTE — Progress Notes (Addendum)
[facility] PROGRESS NOTE  Lindsay Mitchell BDZ:[id number] DOB: 12-07-1940 DOA: 02/28/2015 PCP: Glo Herring., MD  Summary:  This patient was admitted to the hospital with abdominal pain. She had associated nausea, constipation and fatigue. She had CT scan done in the emergency room that showed evidence of sigmoid diverticulitis with associated abscess. She was noted to the hospital and started on intravenous antibiotic. She was seen by general surgery did not feel that surgical intervention was necessary at this time. After several days of antibiotic, CT scan was repeated and she was found to have persistent abscess. Case was discussed with interventional radiology and plan is to pursue CT-guided drain placement on 7/6 .  Assessment/Plan: 1. Acute sigmoid diverticulitis with abscess. Patient is on intravenous antibiotic. General surgery has seen the patient and does not feel that surgical intervention is needed at this time. Plan is to continue current antibiotic. Leukocytosis is improving. Repeat CT scan shows persistent abscess. Case discussed with interventional radiology and plans are for CT guided drain placement on 7/6 by Dr. Annamaria Boots. Once drain is placed, she can follow up with Dr. Arnoldo Morale in 1-2 weeks to have drain removed.  2. COPD with chronic respiratory failure on 3 L of oxygen. appears stable. Respiratory status is at baseline. Continue supportive treatment. 3. Hyperlipidemia. Continue statin. 4. Chronic pain/fibromyalgia. Continue Neurontin and Norco. 5. GERD. Continue proton pump inhibitors 6. Hypokalemia. Improved. 7. Anemia, likely dilutional. No evidence of bleeding. Hemoglobin is stable. Continue to monitor 8. Adrenal insufficiency. Patient was noted to be significantly orthostatic despite IV hydration, losing weight, irregular bowel movement and generalized weakness and fatigue. Cortisol checked and found to be low. She has been started on low dose  prednisone. 9. Diarrhea. Improving. Continue current treatments.  Code Status: Full code Family Communication: Discussed with patient and husband at the bedside Disposition Plan: Discharge home once improved   Consultants:  Gen. Surgery  Interventional radiology  Procedures:    Antibiotics:  Zosyn 6/29>>  HPI/Subjective: Still has nausea. No vomiting. Still has abdominal pain, although feels that she is improving.  Objective: Filed Vitals:   03/06/15 1300  BP: 126/63  Pulse: 75  Temp: 98 F (36.7 C)  Resp: 18    Intake/Output Summary (Last 24 hours) at 03/06/15 1859 Last data filed at 03/06/15 1752  Gross per 24 hour  Intake 3422.5 ml  Output    750 ml  Net 2672.5 ml   Filed Weights   02/28/15 1851  Weight: 46.72 kg (103 lb)    Exam:   General:  NAD, laying in bed  Cardiovascular: s, s2, RRR  Respiratory: CTA B  Abdomen: soft, abdominal tenderness in left abdomen, bs+  Musculoskeletal: no edema  Data Reviewed: Basic Metabolic Panel:  Recent Labs Lab 03/01/15 0634 03/02/15 0623 03/03/15 0608 03/05/15 0612 03/06/15 0608  NA 138 140 137 139 139  K 3.4* 3.4* 4.4 4.3 4.1  CL 96* 100* 98* 99* 99*  CO2 32 30 31 34* 31  GLUCOSE 92 104* 86 98 139*  BUN 20 17 12 14 13   CREATININE 0.81 0.70 0.74 0.78 0.72  CALCIUM 8.6* 8.1* 8.7* 8.6* 8.5*  MG  --   --  1.9  --   --    Liver Function Tests:  Recent Labs Lab 02/28/15 1229  AST 19  ALT 9*  ALKPHOS 80  BILITOT 0.6  PROT 7.3  ALBUMIN 3.1*    Recent Labs Lab 02/28/15 1229  LIPASE 24   No results for  input(s): AMMONIA in the last 168 hours. CBC:  Recent Labs Lab 02/28/15 1229  03/02/15 0623 03/03/15 0608 03/04/15 0605 03/05/15 0612 03/06/15 0608  WBC 14.9*  < > 14.0* 12.4* 11.2* 8.1 6.2  NEUTROABS 11.7*  --   --   --   --   --   --   HGB 12.6  < > 10.0* 10.3* 10.2* 10.2* 9.5*  HCT 39.4  < > 31.7* 32.8* 33.0* 32.7* 30.5*  MCV 96.1  < > 96.6 97.0 97.6 97.9 96.8  PLT 442*  <  > 436* 438* 458* 416* 423*  < > = values in this interval not displayed. Cardiac Enzymes:  Recent Labs Lab 02/28/15 1229  TROPONINI <0.03   BNP (last 3 results)  Recent Labs  12/23/14 0515 02/14/15 0949  BNP 1234.6* 69.0    ProBNP (last 3 results) No results for input(s): PROBNP in the last 8760 hours.  CBG: No results for input(s): GLUCAP in the last 168 hours.  No results found for this or any previous visit (from the past 240 hour(s)).   Studies: Ct Abdomen Pelvis W Contrast  03/05/2015   CLINICAL DATA:  Followup of diverticular abscess.  EXAM: CT ABDOMEN AND PELVIS WITH CONTRAST  TECHNIQUE: Multidetector CT imaging of the abdomen and pelvis was performed using the standard protocol following bolus administration of intravenous contrast.  CONTRAST:  137mL OMNIPAQUE IOHEXOL 300 MG/ML  SOLN  COMPARISON:  02/28/2015 and 12/19/2014.  FINDINGS: Lower chest: A right lower lobe pulmonary nodule measures 6 mm on image 7 and is similar on the prior exam, but absent on 10/20/2012. Right base scarring or atelectasis. Heart size upper normal, without pericardial or pleural effusion.  Hepatobiliary: Suspicion of mild hepatic steatosis, without focal liver lesion. Normal gallbladder, without biliary ductal dilatation.  Pancreas: Pancreatic atrophy, without duct dilatation, mass, or acute inflammation.  Spleen: Normal  Adrenals/Urinary Tract: Normal adrenal glands. Dominant lower pole left renal cyst of nearly 7 cm. Other low-density bilateral renal lesions which are most consistent with cysts. A minimally complex cyst is identified within the medial interpolar left kidney, with a subtle septation identified on coronal image 51. No hydronephrosis. mild wall thickening involving the superior left wall of the urinary bladder, including on coronal image 39. No air within to confirm fistulous communication. This wall thickening is new or increased.  Stomach/Bowel: Normal stomach, without wall thickening.  Extensive colonic diverticulosis with muscular hypertrophy. Moderate inflammation surrounding the sigmoid is felt to be increased. A peripherally enhancing fluid collection within the adjacent mesocolon measures 3.3 x 2.7 cm on image 63 versus 2.9 x 1.8 cm on the prior exam. Tiny foci of gas are identified within.  No free perforation. Large stool burden within the right-sided the colon. Normal small bowel.  Vascular/Lymphatic: Aortic and branch vessel atherosclerosis. No abdominopelvic adenopathy.  Reproductive: Hysterectomy.  No adnexal mass.  Other: No significant free fluid.  Musculoskeletal: Mild osteopenia. Advanced degenerative disc disease at multiple lumbar levels.  IMPRESSION: 1. Slight increase in sigmoid diverticulitis with enlargement of a pericolonic fluid and gas collection consistent with abscess. 2. New or increased wall thickening within the adjacent bladder is mild and presumably reactive. No gas within the bladder to confirm fistulous communication. 3. 6 mm right lung base nodule. If the patient is at high risk for bronchogenic carcinoma, follow-up chest CT at 6-12 months is recommended. If the patient is at low risk for bronchogenic carcinoma, follow-up chest CT at 12 months is recommended. This recommendation follows  the consensus statement: "Guidelines for Management of Small Pulmonary Nodules Detected on CT Scans: A Statement from the [employer]" as published in Radiology2005;237:395-400. Online at: https://www.arnold.com/. 4.  Possible constipation.   Electronically Signed   By: Abigail Miyamoto M.D.   On: 03/05/2015 18:48    Scheduled Meds: . aspirin  81 mg Oral Daily  . atorvastatin  80 mg Oral q1800  . budesonide-formoterol  2 puff Inhalation BID  . dicyclomine  20 mg Oral TID AC  . feeding supplement (ENSURE ENLIVE)  237 mL Oral BID BM  . gabapentin  900 mg Oral TID  . heparin  5,000 Units Subcutaneous 3 times per day  . ipratropium-albuterol   3 mL Nebulization BID  . midodrine  5 mg Oral TID WC  . pantoprazole  40 mg Oral Daily  . piperacillin-tazobactam (ZOSYN)  IV  3.375 g Intravenous Q8H  . potassium chloride  20 mEq Oral Daily  . potassium chloride  40 mEq Oral Once  . predniSONE  5 mg Oral Q breakfast  . saccharomyces boulardii  250 mg Oral BID  . senna  1 tablet Oral BID   Continuous Infusions: . sodium chloride 75 mL/hr at 03/05/15 2254    Principal Problem:   Diverticulitis of intestine with abscess Active Problems:   Constipation   COPD (chronic obstructive pulmonary disease)   Colonic diverticular abscess   Protein-calorie malnutrition, severe   Anemia   Fibromyalgia   Leg edema   HLD (hyperlipidemia)   Chronic respiratory failure with hypoxia   Orthostatic hypotension   Adrenal insufficiency    Time spent: 45mins    Travonta Gill  [facility] Pager [phone]. If 7PM-7AM, please contact night-coverage at [email or web address], password [email or web address] 03/06/2015, 6:59 PM  LOS: 6 days

## 2015-03-06 NOTE — Progress Notes (Signed)
ANTIBIOTIC CONSULT NOTE - FOLLOW UP  Pharmacy Consult for Zosyn  Indication: Intra-abdominal Infection, diverticular abscess   Allergies  Allergen Reactions  . Codeine Other (See Comments)    Makes patient not in right state of mind.   . Oxycodone Itching  . Talwin [Pentazocine] Other (See Comments)    Makes patient not in right state of mind.   . Valium [Diazepam] Other (See Comments)    Just doesn't work.    Patient Measurements: Height: 5\' 3"  (160 cm) Weight: 103 lb (46.72 kg) IBW/kg (Calculated) : 52.4  Vital Signs: Temp: 98.1 F (36.7 C) (07/05 0554) Temp Source: Oral (07/05 0554) BP: 88/42 mmHg (07/05 0554) Pulse Rate: 62 (07/05 0554) Intake/Output from previous day: 07/04 0701 - 07/05 0700 In: 3262.5 [P.O.:320; I.V.:2792.5; IV Piggyback:150] Out: 1500 [Urine:1500] Intake/Output from this shift: Total I/O In: 240 [P.O.:240] Out: 400 [Urine:400]  Labs:  Recent Labs  03/04/15 0605 03/05/15 0612 03/06/15 0608  WBC 11.2* 8.1 6.2  HGB 10.2* 10.2* 9.5*  PLT 458* 416* 423*  CREATININE  --  0.78 0.72   Estimated Creatinine Clearance: 47.6 mL/min (by C-G formula based on Cr of 0.72). No results for input(s): VANCOTROUGH, VANCOPEAK, VANCORANDOM, GENTTROUGH, GENTPEAK, GENTRANDOM, TOBRATROUGH, TOBRAPEAK, TOBRARND, AMIKACINPEAK, AMIKACINTROU, AMIKACIN in the last 72 hours.   Microbiology: Recent Results (from the past 720 hour(s))  Urine culture     Status: None   Collection Time: 02/14/15 10:10 AM  Result Value Ref Range Status   Specimen Description URINE, CLEAN CATCH  Final   Special Requests NONE  Final   Culture   Final    30,000 COLONIES/mL DIPHTHEROIDS(CORYNEBACTERIUM SPECIES) 2,000 COLONIES/mL GRAM POSITIVE COCCI Performed at [facility]    Report Status 02/18/2015 FINAL  Final    Anti-infectives    Start     Dose/Rate Route Frequency Ordered Stop   02/28/15 1900  piperacillin-tazobactam (ZOSYN) IVPB 3.375 g     3.375 g 12.5 mL/hr over  240 Minutes Intravenous Every 8 hours 02/28/15 1857       Assessment: Okay for Protocol, renal function @ baseline.  SCr stable.  No micro data.  May place drain by IR per recent MD documentation.  Goal of Therapy:  Eradicate infection.  Plan:  Continue Zosyn 3.375gm IV every 8 hours. Follow-up micro data, labs, vitals.  Pricilla Larsson 03/06/2015,1:22 PM

## 2015-03-07 ENCOUNTER — Ambulatory Visit (HOSPITAL_COMMUNITY)
Admission: RE | Admit: 2015-03-07 | Discharge: 2015-03-07 | Disposition: A | Discharge status: Home / Self Care | Payer: Medicare Other | Source: Ambulatory Visit | Attending: Radiology | Admitting: Radiology

## 2015-03-07 ENCOUNTER — Encounter (HOSPITAL_COMMUNITY): Payer: Self-pay

## 2015-03-07 DIAGNOSIS — K5732 Diverticulitis of large intestine without perforation or abscess without bleeding: Secondary | ICD-10-CM

## 2015-03-07 DIAGNOSIS — K5730 Diverticulosis of large intestine without perforation or abscess without bleeding: Secondary | ICD-10-CM | POA: Diagnosis not present

## 2015-03-07 DIAGNOSIS — J449 Chronic obstructive pulmonary disease, unspecified: Secondary | ICD-10-CM | POA: Insufficient documentation

## 2015-03-07 DIAGNOSIS — Z8673 Personal history of transient ischemic attack (TIA), and cerebral infarction without residual deficits: Secondary | ICD-10-CM | POA: Insufficient documentation

## 2015-03-07 DIAGNOSIS — J9611 Chronic respiratory failure with hypoxia: Secondary | ICD-10-CM

## 2015-03-07 DIAGNOSIS — E2740 Unspecified adrenocortical insufficiency: Secondary | ICD-10-CM

## 2015-03-07 DIAGNOSIS — K630 Abscess of intestine: Secondary | ICD-10-CM | POA: Insufficient documentation

## 2015-03-07 DIAGNOSIS — K5720 Diverticulitis of large intestine with perforation and abscess without bleeding: Secondary | ICD-10-CM

## 2015-03-07 DIAGNOSIS — Z9981 Dependence on supplemental oxygen: Secondary | ICD-10-CM | POA: Insufficient documentation

## 2015-03-07 DIAGNOSIS — Z7982 Long term (current) use of aspirin: Secondary | ICD-10-CM

## 2015-03-07 DIAGNOSIS — I252 Old myocardial infarction: Secondary | ICD-10-CM | POA: Insufficient documentation

## 2015-03-07 DIAGNOSIS — Z87891 Personal history of nicotine dependence: Secondary | ICD-10-CM | POA: Insufficient documentation

## 2015-03-07 MED ORDER — MORPHINE SULFATE 2 MG/ML IJ SOLN
2.0000 mg | INTRAMUSCULAR | Status: DC | PRN
  Administered 2015-03-07 – 2015-03-08 (×5): 2 mg via INTRAVENOUS
  Filled 2015-03-07 (×5): qty 1

## 2015-03-07 MED ORDER — LIDOCAINE HCL 1 % IJ SOLN
INTRAMUSCULAR | Status: AC
  Filled 2015-03-07: qty 20

## 2015-03-07 MED ORDER — MIDAZOLAM HCL 2 MG/2ML IJ SOLN
INTRAMUSCULAR | Status: AC | PRN
  Administered 2015-03-07: 2 mg via INTRAVENOUS

## 2015-03-07 MED ORDER — MIDAZOLAM HCL 2 MG/2ML IJ SOLN
INTRAMUSCULAR | Status: AC
  Filled 2015-03-07: qty 4

## 2015-03-07 MED ORDER — FENTANYL CITRATE (PF) 100 MCG/2ML IJ SOLN
INTRAMUSCULAR | Status: AC | PRN
  Administered 2015-03-07: 100 ug via INTRAVENOUS
  Administered 2015-03-07 (×2): 50 ug via INTRAVENOUS

## 2015-03-07 MED ORDER — FENTANYL CITRATE (PF) 100 MCG/2ML IJ SOLN
INTRAMUSCULAR | Status: AC
  Filled 2015-03-07: qty 4

## 2015-03-07 NOTE — Progress Notes (Signed)
[facility] PROGRESS NOTE  Lindsay Mitchell EZM:[id number] DOB: September 06, 1940    PCP:   Glo Herring., MD   HPI: Lindsay Mitchell is an 74 y.o. female admitted for diverticular abscess, not responding to antibiotics, seen by surgery, for IR to place drainage tube today.  She has no fever, chills, SOB, or chest pain.  Still has abdominal pain.   Rewiew of Systems:  Constitutional: Negative for malaise, fever and chills. No significant weight loss or weight gain Eyes: Negative for eye pain, redness and discharge, diplopia, visual changes, or flashes of light. ENMT: Negative for ear pain, hoarseness, nasal congestion, sinus pressure and sore throat. No headaches; tinnitus, drooling, or problem swallowing. Cardiovascular: Negative for chest pain, palpitations, diaphoresis, dyspnea and peripheral edema. ; No orthopnea, PND Respiratory: Negative for cough, hemoptysis, wheezing and stridor. No pleuritic chestpain. Gastrointestinal: Negative for nausea, vomiting, diarrhea, constipation,  melena, blood in stool, hematemesis, jaundice and rectal bleeding.    Genitourinary: Negative for frequency, dysuria, incontinence,flank pain and hematuria; Musculoskeletal: Negative for back pain and neck pain. Negative for swelling and trauma.;  Skin: . Negative for pruritus, rash, abrasions, bruising and skin lesion.; ulcerations Neuro: Negative for headache, lightheadedness and neck stiffness. Negative for weakness, altered level of consciousness , altered mental status, extremity weakness, burning feet, involuntary movement, seizure and syncope.  Psych: negative for anxiety, depression, insomnia, tearfulness, panic attacks, hallucinations, paranoia, suicidal or homicidal ideation    Past Medical History  Diagnosis Date  . Constipation   . Depression with anxiety   . Diverticulosis   . DVT (deep venous thrombosis)   . COPD (chronic obstructive pulmonary disease)     Emphysema radiographically  .  Carotid artery disease     a. duplex 12/352: RICA <65%, LICA 68-12%. Followed by [facility].  . Anxiety   . TIA (transient ischemic attack)   . Full dentures   . Pulmonary nodules 08/11/2013    a. CT angio 08/2013: 4-61mm nodules in RUL/RML, f/u recommended 6 months.  . Migraine   . Fibromyalgia   . Stroke   . Protein calorie malnutrition   . First degree AV block   . On home O2   . Myocardial infarction     Past Surgical History  Procedure Laterality Date  . Abdominal hysterectomy  1971  . Back surgery      X3  . Colonoscopy  June 2002    Dr. Ferdinand Lango: severe diverticular disease with haustral hypertrophy, primary and secondary diverticulosis, persistent spasticity, early stenosis  . Colonoscopy with propofol N/A 06/21/2013    Dr. Oneida Alar: sigmoid colon and descending colon with diverticula, small internal hemorrhoids  . Tonsillectomy    . Appendectomy    . Eye surgery      both cataracts-lenses  . Nasal sinus surgery Right 07/11/2013    Procedure: RIGHT ENDOSCOPIC ANTERIOR ETHMOIDECTOMY/RIGHT ENDOSCOPIC MAXILLARY ANTROSTOMY WITH REMOVAL OF TISSUE;  Surgeon: Ascencion Dike, MD;  Location: [place];  Service: ENT;  Laterality: Right;  . Esophagogastroduodenoscopy N/A 03/06/2014    Procedure: ESOPHAGOGASTRODUODENOSCOPY (EGD);  Surgeon: Danie Binder, MD;  [facility];  Service: Endoscopy;  Laterality: N/A;  9;30  . Savory dilation N/A 03/06/2014    Procedure: SAVORY DILATION;  Surgeon: Danie Binder, MD;  Location: [facility];  Service: Endoscopy;  Laterality: N/A;  . Esophageal biopsy  03/06/2014    Procedure: BIOPSY;  Surgeon: Danie Binder, MD;  Location: [facility];  Service: Endoscopy;;  . Other  surgical history      scar tissue removal x2  . Left heart catheterization with coronary angiogram N/A 12/21/2014    Procedure: LEFT HEART CATHETERIZATION WITH CORONARY ANGIOGRAM;  Surgeon: Belva Crome, MD;  Location: [facility] CATH [facility];  Service: Cardiovascular;   Laterality: N/A;    Medications:  HOME MEDS: Prior to Admission medications   Medication Sig Start Date End Date Taking? Authorizing Provider  aspirin 81 MG chewable tablet Chew 1 tablet (81 mg total) by mouth daily. 12/28/14  Yes Reyne Dumas, MD  atorvastatin (LIPITOR) 80 MG tablet Take 1 tablet (80 mg total) by mouth daily at 6 PM. 01/10/15  Yes Imogene Burn, PA-C  budesonide-formoterol (SYMBICORT) 80-4.5 MCG/ACT inhaler Inhale 2 puffs into the lungs 2 (two) times daily. 12/28/14  Yes Reyne Dumas, MD  furosemide (LASIX) 20 MG tablet Take 20 mg by mouth daily.   Yes Historical Provider, MD  gabapentin (NEURONTIN) 300 MG capsule Take 3 capsules (900 mg total) by mouth 3 (three) times daily. 11/09/14  Yes Melvenia Beam, MD  HYDROcodone-acetaminophen (NORCO) 10-325 MG per tablet Take 1 tablet by mouth every 6 (six) hours as needed for moderate pain or severe pain.   Yes Historical Provider, MD  ipratropium-albuterol (DUONEB) 0.5-2.5 (3) MG/3ML SOLN Take 3 mLs by nebulization 3 (three) times daily. 08/12/13  Yes Rexene Alberts, MD  Linaclotide [facility]) 145 MCG CAPS capsule Take 1 capsule (145 mcg total) by mouth daily. 02/15/15  Yes Lezlie Octave Black, NP  omeprazole (PRILOSEC) 20 MG capsule Take 20 mg by mouth daily. 02/21/15  Yes Historical Provider, MD  potassium chloride (K-DUR) 10 MEQ tablet Take 2 tablets (20 mEq total) by mouth daily. 01/10/15  Yes Imogene Burn, PA-C  saccharomyces boulardii (FLORASTOR) 250 MG capsule Take 250 mg by mouth 2 (two) times daily.   Yes Historical Provider, MD  albuterol (PROVENTIL HFA;VENTOLIN HFA) 108 (90 BASE) MCG/ACT inhaler Inhale 2 puffs into the lungs every 4 (four) hours as needed for wheezing or shortness of breath. Patient not taking: Reported on 02/28/2015 08/12/13   Rexene Alberts, MD  furosemide (LASIX) 40 MG tablet Take 1 tablet (40 mg total) by mouth daily. Patient not taking: Reported on 02/28/2015 01/10/15   Imogene Burn, PA-C     Allergies:   Allergies  Allergen Reactions  . Codeine Other (See Comments)    Makes patient not in right state of mind.   . Oxycodone Itching  . Talwin [Pentazocine] Other (See Comments)    Makes patient not in right state of mind.   . Valium [Diazepam] Other (See Comments)    Just doesn't work.     Social History:   reports that she quit smoking about 2 months ago. Her smoking use included Cigarettes. She has a 50 pack-year smoking history. She does not have any smokeless tobacco history on file. She reports that she does not drink alcohol or use illicit drugs.  Family History: Family History  Problem Relation Age of Onset  . Colon cancer Neg Hx   . Breast cancer Mother   . Heart disease Mother   . Hypertension Mother   . Heart attack Mother   . Other Mother     varicose veins  . Cancer Mother   . Varicose Veins Mother   . Diabetes Brother   . Hypertension Brother   . Heart disease Brother   . Throat cancer Father   . Cancer Father      Physical Exam: Danley Danker  Vitals:   03/06/15 0659 03/06/15 1300 03/06/15 1957 03/06/15 2158  BP:  126/63  105/48  Pulse:  75  63  Temp:  98 F (36.7 C)  98.3 F (36.8 C)  TempSrc:    Oral  Resp:  18  16  Height:      Weight:      SpO2: 98% 98% 98% 100%   Blood pressure 105/48, pulse 63, temperature 98.3 F (36.8 C), temperature source Oral, resp. rate 16, height 5\' 3"  (1.6 m), weight 46.72 kg (103 lb), SpO2 100 %.  GEN:  Pleasant patient lying in the stretcher in no acute distress; cooperative with exam. PSYCH:  alert and oriented x4; does not appear anxious or depressed; affect is appropriate. HEENT: Mucous membranes pink and anicteric; PERRLA; EOM intact; no cervical lymphadenopathy nor thyromegaly or carotid bruit; no JVD; There were no stridor. Neck is very supple. Breasts:: Not examined CHEST WALL: No tenderness CHEST: Normal respiration, clear to auscultation bilaterally.  HEART: Regular rate and rhythm.  There are no murmur, rub, or  gallops.   BACK: No kyphosis or scoliosis; no CVA tenderness ABDOMEN: soft, and no masses, no organomegaly, normal abdominal bowel sounds; no pannus; no intertriginous candida. There is no rebound and no distention. Rectal Exam: Not done EXTREMITIES: No bone or joint deformity; age-appropriate arthropathy of the hands and knees; no edema; no ulcerations.  There is no calf tenderness. Genitalia: not examined PULSES: 2+ and symmetric SKIN: Normal hydration no rash or ulceration CNS: Cranial nerves 2-12 grossly intact no focal lateralizing neurologic deficit.  Speech is fluent; uvula elevated with phonation, facial symmetry and tongue midline. DTR are normal bilaterally, cerebella exam is intact, barbinski is negative and strengths are equaled bilaterally.  No sensory loss.   Labs on Admission:  Basic Metabolic Panel:  Recent Labs Lab 03/01/15 0634 03/02/15 0623 03/03/15 0608 03/05/15 0612 03/06/15 0608  NA 138 140 137 139 139  K 3.4* 3.4* 4.4 4.3 4.1  CL 96* 100* 98* 99* 99*  CO2 32 30 31 34* 31  GLUCOSE 92 104* 86 98 139*  BUN 20 17 12 14 13   CREATININE 0.81 0.70 0.74 0.78 0.72  CALCIUM 8.6* 8.1* 8.7* 8.6* 8.5*  MG  --   --  1.9  --   --    Liver Function Tests:  Recent Labs Lab 02/28/15 1229  AST 19  ALT 9*  ALKPHOS 80  BILITOT 0.6  PROT 7.3  ALBUMIN 3.1*    Recent Labs Lab 02/28/15 1229  LIPASE 24   No results for input(s): AMMONIA in the last 168 hours. CBC:  Recent Labs Lab 02/28/15 1229  03/02/15 0623 03/03/15 0608 03/04/15 0605 03/05/15 0612 03/06/15 0608  WBC 14.9*  < > 14.0* 12.4* 11.2* 8.1 6.2  NEUTROABS 11.7*  --   --   --   --   --   --   HGB 12.6  < > 10.0* 10.3* 10.2* 10.2* 9.5*  HCT 39.4  < > 31.7* 32.8* 33.0* 32.7* 30.5*  MCV 96.1  < > 96.6 97.0 97.6 97.9 96.8  PLT 442*  < > 436* 438* 458* 416* 423*  < > = values in this interval not displayed. Cardiac Enzymes:  Recent Labs Lab 02/28/15 1229  TROPONINI <0.03    Radiological Exams  on Admission: Ct Abdomen Pelvis W Contrast  03/05/2015   CLINICAL DATA:  Followup of diverticular abscess.  EXAM: CT ABDOMEN AND PELVIS WITH CONTRAST  TECHNIQUE: Multidetector CT imaging of  the abdomen and pelvis was performed using the standard protocol following bolus administration of intravenous contrast.  CONTRAST:  159mL OMNIPAQUE IOHEXOL 300 MG/ML  SOLN  COMPARISON:  02/28/2015 and 12/19/2014.  FINDINGS: Lower chest: A right lower lobe pulmonary nodule measures 6 mm on image 7 and is similar on the prior exam, but absent on 10/20/2012. Right base scarring or atelectasis. Heart size upper normal, without pericardial or pleural effusion.  Hepatobiliary: Suspicion of mild hepatic steatosis, without focal liver lesion. Normal gallbladder, without biliary ductal dilatation.  Pancreas: Pancreatic atrophy, without duct dilatation, mass, or acute inflammation.  Spleen: Normal  Adrenals/Urinary Tract: Normal adrenal glands. Dominant lower pole left renal cyst of nearly 7 cm. Other low-density bilateral renal lesions which are most consistent with cysts. A minimally complex cyst is identified within the medial interpolar left kidney, with a subtle septation identified on coronal image 51. No hydronephrosis. mild wall thickening involving the superior left wall of the urinary bladder, including on coronal image 39. No air within to confirm fistulous communication. This wall thickening is new or increased.  Stomach/Bowel: Normal stomach, without wall thickening. Extensive colonic diverticulosis with muscular hypertrophy. Moderate inflammation surrounding the sigmoid is felt to be increased. A peripherally enhancing fluid collection within the adjacent mesocolon measures 3.3 x 2.7 cm on image 63 versus 2.9 x 1.8 cm on the prior exam. Tiny foci of gas are identified within.  No free perforation. Large stool burden within the right-sided the colon. Normal small bowel.  Vascular/Lymphatic: Aortic and branch vessel  atherosclerosis. No abdominopelvic adenopathy.  Reproductive: Hysterectomy.  No adnexal mass.  Other: No significant free fluid.  Musculoskeletal: Mild osteopenia. Advanced degenerative disc disease at multiple lumbar levels.  IMPRESSION: 1. Slight increase in sigmoid diverticulitis with enlargement of a pericolonic fluid and gas collection consistent with abscess. 2. New or increased wall thickening within the adjacent bladder is mild and presumably reactive. No gas within the bladder to confirm fistulous communication. 3. 6 mm right lung base nodule. If the patient is at high risk for bronchogenic carcinoma, follow-up chest CT at 6-12 months is recommended. If the patient is at low risk for bronchogenic carcinoma, follow-up chest CT at 12 months is recommended. This recommendation follows the consensus statement: "Guidelines for Management of Small Pulmonary Nodules Detected on CT Scans: A Statement from the [place]" as published in Radiology2005;237:395-400. Online at: [email or web address].arnold.[email or web address] 4.  Possible constipation.   Electronically Signed   By: Abigail Miyamoto M.D.   On: 03/05/2015 18:48    EKG: Independently reviewed.    Assessment/Plan Present on Admission:  . Diverticulitis of intestine with abscess . COPD (chronic obstructive pulmonary disease) . Constipation . Colonic diverticular abscess . Protein-calorie malnutrition, severe . Fibromyalgia . Leg edema . HLD (hyperlipidemia) . Chronic respiratory failure with hypoxia . Anemia . Adrenal insufficiency  PLAN:   1. Acute sigmoid diverticulitis with abscess. Patient is on intravenous antibiotic. General surgery has seen the patient and does not feel that surgical intervention is needed at this time. Plan is to continue current antibiotic. Leukocytosis is improving. Repeat CT scan shows persistent abscess. Plan to have IR placed drainage tube (Dr Annamaria Boots). Will keep inpatient today, and plan follow up  with Dr. Arnoldo Morale in 1-2 weeks to have drain removed.  2. COPD with chronic respiratory failure on 3 L of oxygen. appears stable. Respiratory status is at baseline. Continue supportive treatment. 3. Hyperlipidemia. Continue statin. 4. Chronic pain/fibromyalgia. Continue Neurontin and Norco. 5. GERD. Continue proton pump inhibitors 6.  Hypokalemia. Improved. 7. Anemia, likely dilutional. No evidence of bleeding. Hemoglobin is stable. Continue to monitor 8. Adrenal insufficiency. Patient was noted to be significantly orthostatic despite IV hydration, losing weight, irregular bowel movement and generalized weakness and fatigue. Cortisol checked and found to be low. She has been started on low dose prednisone. 9. Diarrhea. Improving. Continue current treatments.   Other plans as per orders.  Code Status: FULL Haskel Khan, MD. [facility] Pager [phone] 7pm to 7am.  03/07/2015, 10:00 AM

## 2015-03-07 NOTE — Consult Note (Signed)
Chief Complaint: Abdominal pain Sigmoid diverticulitis  Referring Physician(s): Dr Roderic Palau  History of Present Illness: Lindsay Mitchell is a 74 y.o. female   Abd pain CT reveals sigmoid diverticulitis Persistent abscess and pain after antibiotic treatment Request for abscess drain placement Dr Annamaria Boots reviewed imaging and approves procedure I have seen and examined pt  Past Medical History  Diagnosis Date  . Constipation   . Depression with anxiety   . Diverticulosis   . DVT (deep venous thrombosis)   . COPD (chronic obstructive pulmonary disease)     Emphysema radiographically  . Carotid artery disease     a. duplex 05/4853: RICA <62%, LICA 70-35%. Followed by [facility].  . Anxiety   . TIA (transient ischemic attack)   . Full dentures   . Pulmonary nodules 08/11/2013    a. CT angio 08/2013: 4-2mm nodules in RUL/RML, f/u recommended 6 months.  . Migraine   . Fibromyalgia   . Stroke   . Protein calorie malnutrition   . First degree AV block   . On home O2   . Myocardial infarction     Past Surgical History  Procedure Laterality Date  . Abdominal hysterectomy  1971  . Back surgery      X3  . Colonoscopy  June 2002    Dr. Ferdinand Lango: severe diverticular disease with haustral hypertrophy, primary and secondary diverticulosis, persistent spasticity, early stenosis  . Colonoscopy with propofol N/A 06/21/2013    Dr. Oneida Alar: sigmoid colon and descending colon with diverticula, small internal hemorrhoids  . Tonsillectomy    . Appendectomy    . Eye surgery      both cataracts-lenses  . Nasal sinus surgery Right 07/11/2013    Procedure: RIGHT ENDOSCOPIC ANTERIOR ETHMOIDECTOMY/RIGHT ENDOSCOPIC MAXILLARY ANTROSTOMY WITH REMOVAL OF TISSUE;  Surgeon: Ascencion Dike, MD;  Location: [facility];  Service: ENT;  Laterality: Right;  . Esophagogastroduodenoscopy N/A 03/06/2014    Procedure: ESOPHAGOGASTRODUODENOSCOPY (EGD);  Surgeon: Danie Binder, MD;  [facility];   Service: Endoscopy;  Laterality: N/A;  9;30  . Savory dilation N/A 03/06/2014    Procedure: SAVORY DILATION;  Surgeon: Danie Binder, MD;  [facility];  Service: Endoscopy;  Laterality: N/A;  . Esophageal biopsy  03/06/2014    Procedure: BIOPSY;  Surgeon: Danie Binder, MD;  [facility];  Service: Endoscopy;;  . Other surgical history      scar tissue removal x2  . Left heart catheterization with coronary angiogram N/A 12/21/2014    Procedure: LEFT HEART CATHETERIZATION WITH CORONARY ANGIOGRAM;  Surgeon: Belva Crome, MD;  Location: [facility] CATH LAB;  Service: Cardiovascular;  Laterality: N/A;    Allergies: Codeine; Oxycodone; Talwin; and Valium  Medications: Prior to Admission medications   Medication Sig Start Date End Date Taking? Authorizing Provider  albuterol (PROVENTIL HFA;VENTOLIN HFA) 108 (90 BASE) MCG/ACT inhaler Inhale 2 puffs into the lungs every 4 (four) hours as needed for wheezing or shortness of breath. Patient not taking: Reported on 02/28/2015 08/12/13   Rexene Alberts, MD  aspirin 81 MG chewable tablet Chew 1 tablet (81 mg total) by mouth daily. 12/28/14   Reyne Dumas, MD  atorvastatin (LIPITOR) 80 MG tablet Take 1 tablet (80 mg total) by mouth daily at 6 PM. 01/10/15   Imogene Burn, PA-C  budesonide-formoterol (SYMBICORT) 80-4.5 MCG/ACT inhaler Inhale 2 puffs into the lungs 2 (two) times daily. 12/28/14   Reyne Dumas, MD  furosemide (LASIX) 20 MG  tablet Take 20 mg by mouth daily.    Historical Provider, MD  furosemide (LASIX) 40 MG tablet Take 1 tablet (40 mg total) by mouth daily. Patient not taking: Reported on 02/28/2015 01/10/15   Imogene Burn, PA-C  gabapentin (NEURONTIN) 300 MG capsule Take 3 capsules (900 mg total) by mouth 3 (three) times daily. 11/09/14   Melvenia Beam, MD  HYDROcodone-acetaminophen (NORCO) 10-325 MG per tablet Take 1 tablet by mouth every 6 (six) hours as needed for moderate pain or severe pain.    Historical Provider, MD    ipratropium-albuterol (DUONEB) 0.5-2.5 (3) MG/3ML SOLN Take 3 mLs by nebulization 3 (three) times daily. 08/12/13   Rexene Alberts, MD  Linaclotide [facility]) 145 MCG CAPS capsule Take 1 capsule (145 mcg total) by mouth daily. 02/15/15   Radene Gunning, NP  omeprazole (PRILOSEC) 20 MG capsule Take 20 mg by mouth daily. 02/21/15   Historical Provider, MD  potassium chloride (K-DUR) 10 MEQ tablet Take 2 tablets (20 mEq total) by mouth daily. 01/10/15   Imogene Burn, PA-C  saccharomyces boulardii (FLORASTOR) 250 MG capsule Take 250 mg by mouth 2 (two) times daily.    Historical Provider, MD     Family History  Problem Relation Age of Onset  . Colon cancer Neg Hx   . Breast cancer Mother   . Heart disease Mother   . Hypertension Mother   . Heart attack Mother   . Other Mother     varicose veins  . Cancer Mother   . Varicose Veins Mother   . Diabetes Brother   . Hypertension Brother   . Heart disease Brother   . Throat cancer Father   . Cancer Father     History   Social History  . Marital Status: Married    Spouse Name: Jenny Reichmann  . Number of Children: 4  . Years of Education: 11th   Occupational History  . Retired     Social History Main Topics  . Smoking status: Former Smoker -- 1.00 packs/day for 50 years    Types: Cigarettes    Quit date: 12/15/2014  . Smokeless tobacco: Not on file     Comment: smoked X 60 years  . Alcohol Use: No  . Drug Use: No  . Sexual Activity: Yes    Birth Control/ Protection: Surgical   Other Topics Concern  . None   Social History Narrative   Lives at home with husband Jenny Reichmann.    Caffeine use: 3-4 cups coffee/day     Review of Systems: A 12 point ROS discussed and pertinent positives are indicated in the HPI above.  All other systems are negative.  Review of Systems  Constitutional: Positive for appetite change. Negative for fever.  Respiratory: Negative for shortness of breath.   Cardiovascular: Negative for chest pain.   Gastrointestinal: Positive for abdominal pain.  Neurological: Positive for weakness.  Psychiatric/Behavioral: Negative for behavioral problems and confusion.    Vital Signs: There were no vitals taken for this visit.  Physical Exam  Constitutional: She is oriented to person, place, and time.  Cardiovascular: Normal rate and regular rhythm.   No murmur heard. Pulmonary/Chest: Effort normal and breath sounds normal. She has no wheezes.  Abdominal: Soft. Bowel sounds are normal. There is tenderness.  Musculoskeletal: Normal range of motion.  Neurological: She is alert and oriented to person, place, and time.  Skin: Skin is warm and dry.  Psychiatric: She has a normal mood and affect. Her  behavior is normal. Judgment and thought content normal.  Nursing note and vitals reviewed.   Mallampati Score:  MD Evaluation Airway: WNL Heart: WNL Abdomen: WNL Chest/ Lungs: WNL ASA  Classification: 3 Mallampati/Airway Score: One  Imaging: Dg Chest 2 View  02/28/2015   CLINICAL DATA:  Hypotension with nausea and vomiting  EXAM: CHEST  2 VIEW  COMPARISON:  February 14, 2015  FINDINGS: Lungs are mildly hyperexpanded. There is no edema or consolidation. The heart size and pulmonary vascularity are normal. There is atherosclerotic change in the aortic arch. No bone lesions.  IMPRESSION: Lungs mildly hyperexpanded.  No edema or consolidation.   Electronically Signed   By: Lowella Grip III M.D.   On: 02/28/2015 13:25   Ct Abdomen Pelvis W Contrast  03/05/2015   CLINICAL DATA:  Followup of diverticular abscess.  EXAM: CT ABDOMEN AND PELVIS WITH CONTRAST  TECHNIQUE: Multidetector CT imaging of the abdomen and pelvis was performed using the standard protocol following bolus administration of intravenous contrast.  CONTRAST:  183mL OMNIPAQUE IOHEXOL 300 MG/ML  SOLN  COMPARISON:  02/28/2015 and 12/19/2014.  FINDINGS: Lower chest: A right lower lobe pulmonary nodule measures 6 mm on image 7 and is similar  on the prior exam, but absent on 10/20/2012. Right base scarring or atelectasis. Heart size upper normal, without pericardial or pleural effusion.  Hepatobiliary: Suspicion of mild hepatic steatosis, without focal liver lesion. Normal gallbladder, without biliary ductal dilatation.  Pancreas: Pancreatic atrophy, without duct dilatation, mass, or acute inflammation.  Spleen: Normal  Adrenals/Urinary Tract: Normal adrenal glands. Dominant lower pole left renal cyst of nearly 7 cm. Other low-density bilateral renal lesions which are most consistent with cysts. A minimally complex cyst is identified within the medial interpolar left kidney, with a subtle septation identified on coronal image 51. No hydronephrosis. mild wall thickening involving the superior left wall of the urinary bladder, including on coronal image 39. No air within to confirm fistulous communication. This wall thickening is new or increased.  Stomach/Bowel: Normal stomach, without wall thickening. Extensive colonic diverticulosis with muscular hypertrophy. Moderate inflammation surrounding the sigmoid is felt to be increased. A peripherally enhancing fluid collection within the adjacent mesocolon measures 3.3 x 2.7 cm on image 63 versus 2.9 x 1.8 cm on the prior exam. Tiny foci of gas are identified within.  No free perforation. Large stool burden within the right-sided the colon. Normal small bowel.  Vascular/Lymphatic: Aortic and branch vessel atherosclerosis. No abdominopelvic adenopathy.  Reproductive: Hysterectomy.  No adnexal mass.  Other: No significant free fluid.  Musculoskeletal: Mild osteopenia. Advanced degenerative disc disease at multiple lumbar levels.  IMPRESSION: 1. Slight increase in sigmoid diverticulitis with enlargement of a pericolonic fluid and gas collection consistent with abscess. 2. New or increased wall thickening within the adjacent bladder is mild and presumably reactive. No gas within the bladder to confirm fistulous  communication. 3. 6 mm right lung base nodule. If the patient is at high risk for bronchogenic carcinoma, follow-up chest CT at 6-12 months is recommended. If the patient is at low risk for bronchogenic carcinoma, follow-up chest CT at 12 months is recommended. This recommendation follows the consensus statement: "Guidelines for Management of Small Pulmonary Nodules Detected on CT Scans: A Statement from the [place]" as published in Radiology2005;237:395-400. Online at: https://www.arnold.com/. 4.  Possible constipation.   Electronically Signed   By: Abigail Miyamoto M.D.   On: 03/05/2015 18:48   Ct Abdomen Pelvis W Contrast  02/28/2015   CLINICAL DATA:  Abdominal pain, nausea vomiting x6 months worsened today. History of diverticulitis.  EXAM: CT ABDOMEN AND PELVIS WITH CONTRAST  TECHNIQUE: Multidetector CT imaging of the abdomen and pelvis was performed using the standard protocol following bolus administration of intravenous contrast.  CONTRAST:  131mL OMNIPAQUE IOHEXOL 300 MG/ML  SOLN  COMPARISON:  CT dated 12/19/2014  FINDINGS: Lower chest: Mild emphysematous changes. There has been interval resolution of the previously seen bilateral pleural effusions and consolidative changes of the lung bases. A 6 mm right lung base nodular density as well as linear densities at the right lung base due to the the new goals of go care for the likely represent residual scarring.  Peritoneum: No free air or free fluid.  Liver: Unremarkable.No intrahepatic biliary ductal dilatation.  Gallbladder: Unremarkable. No calcified gallstone. No pericholecystic fluid.  Pancreas: Unremarkable.No ductal dilatation.  Spleen: Unremarkable.  Adrenals glands: Unremarkable.  Kidneys, ureters, urinary bladder: There multiple bilateral renal cysts measuring up to 7 cm in the inferior pole of the left kidney appear grossly stable compared to prior study. Scattered subcentimeter left renal hypodense lesions  are too small to characterize. There is no hydronephrosis on either side. The urinary bladder is grossly unremarkable.  Reproductive: Hysterectomy.  Undo  Bowel and appendix: There is extensive sigmoid diverticulosis. There is segmental thickening and inflammatory changes of the sigmoid colon compatible with diverticulitis. A 2.9 x 1.8 cm loculated fluid collection/abscess is noted lateral to the sigmoid colon in the left hemipelvis which is new from the study dated 12/19/2014 at appears similar to the abscess seen on the study dated 12/13/2014. Constipation no bowel obstruction. The appendix is unremarkable.  Vascular/Lymphatic: There is aortoiliac atherosclerotic disease. No lymphadenopathy.  Abdominal wall/Musculoskeletal: Degenerative changes of the spine. No acute fracture.  IMPRESSION: Sigmoid diverticulitis with diverticular abscess.  Constipation.  No bowel obstruction.   Electronically Signed   By: Anner Crete M.D.   On: 02/28/2015 16:06   Dg Chest Portable 1 View  02/14/2015   CLINICAL DATA:  Productive cough, congestion, shortness of breath for 1 week  EXAM: PORTABLE CHEST - 1 VIEW  COMPARISON:  12/26/2014  FINDINGS: Cardiomediastinal silhouette is stable. No acute infiltrate or pleural effusion. No pulmonary edema. Mild hyperinflation.  IMPRESSION: No active disease.  Mild hyperinflation.   Electronically Signed   By: Lahoma Crocker M.D.   On: 02/14/2015 09:50   Dg Abd Portable 1v  02/14/2015   CLINICAL DATA:  Abdominal pain and constipation for 8 days.  EXAM: PORTABLE ABDOMEN - 1 VIEW  COMPARISON:  CT scan 09/19/2014  FINDINGS: Mild to moderate stool throughout the colon and down into the rectum may suggest constipation. There are also nondistended air-filled loops of small bowel. The soft tissue shadows are maintained. No free air. The bony structures are intact.  IMPRESSION: Probable mild ileus and mild constipation.   Electronically Signed   By: Marijo Sanes M.D.   On: 02/14/2015 16:31     Labs:  CBC:  Recent Labs  03/03/15 0608 03/04/15 0605 03/05/15 0612 03/06/15 0608  WBC 12.4* 11.2* 8.1 6.2  HGB 10.3* 10.2* 10.2* 9.5*  HCT 32.8* 33.0* 32.7* 30.5*  PLT 438* 458* 416* 423*    COAGS:  Recent Labs  12/19/14 0735 12/21/14 1120 03/06/15 1405  INR 1.43 1.21 1.10  APTT 46*  --  70*    BMP:  Recent Labs  03/02/15 0623 03/03/15 0608 03/05/15 0612 03/06/15 0608  NA 140 137 139 139  K 3.4* 4.4 4.3 4.1  CL 100* 98* 99* 99*  CO2 30 31 34* 31  GLUCOSE 104* 86 98 139*  BUN 17 12 14 13   CALCIUM 8.1* 8.7* 8.6* 8.5*  CREATININE 0.70 0.74 0.78 0.72  GFRNONAA >60 >60 >60 >60  GFRAA >60 >60 >60 >60    LIVER FUNCTION TESTS:  Recent Labs  12/26/14 0405 12/27/14 0600 02/14/15 0940 02/28/15 1229  BILITOT 0.4 0.6 1.0 0.6  AST 32 22 19 19   ALT 15 11 11* 9*  ALKPHOS 62 61 97 80  PROT 5.7* 5.2* 6.8 7.3  ALBUMIN 2.8* 2.8* 3.3* 3.1*    TUMOR MARKERS: No results for input(s): AFPTM, CEA, CA199, CHROMGRNA in the last 8760 hours.  Assessment and Plan:  Diverticular abscess Scheduled for drain placement Risks and Benefits discussed with the patient including bleeding, infection, damage to adjacent structures, bowel perforation/fistula connection, and sepsis. All of the patient's questions were answered, patient is agreeable to proceed. Consent signed and in chart.   Thank you for this interesting consult.  I greatly enjoyed meeting Lindsay Mitchell and look forward to participating in their care.  Signed: Shavaun Osterloh A 03/07/2015, 10:49 AM   I spent a total of 40 Minutes    in face to face in clinical consultation, greater than 50% of which was counseling/coordinating care for diverticular abscess drain placement

## 2015-03-07 NOTE — Progress Notes (Signed)
1438 Patient reported Norco isn't effective for her pain level and is requesting IV push Morphine. MD notified.

## 2015-03-07 NOTE — Procedures (Signed)
Successful CT LLQ DIVERTICULAR ABSCESS DRAIN INSERTION No comp Stable 10cc pus aspirated Gs/cx sent Full report in pacs

## 2015-03-08 MED ORDER — HYDROCODONE-ACETAMINOPHEN 10-325 MG PO TABS: 1.0000 | ORAL_TABLET | Freq: Four times a day (QID) | ORAL | Status: AC | PRN

## 2015-03-08 MED ORDER — AMOXICILLIN-POT CLAVULANATE 875-125 MG PO TABS: 1.0000 | ORAL_TABLET | Freq: Two times a day (BID) | ORAL | Status: DC

## 2015-03-08 MED ORDER — PANTOPRAZOLE SODIUM 40 MG PO TBEC: 40.0000 mg | DELAYED_RELEASE_TABLET | Freq: Every day | ORAL | Status: AC

## 2015-03-08 MED ORDER — PREDNISONE 5 MG PO TABS: 5.0000 mg | ORAL_TABLET | Freq: Every day | ORAL | Status: AC

## 2015-03-08 MED ORDER — AMOXICILLIN-POT CLAVULANATE 875-125 MG PO TABS
1.0000 | ORAL_TABLET | Freq: Two times a day (BID) | ORAL | Status: DC
Start: 2015-03-08 — End: 2015-03-10

## 2015-03-08 NOTE — Discharge Instructions (Signed)
Bulb Drain Home Care A bulb drain consists of a thin rubber tube and a soft, round bulb that creates a gentle suction. The rubber tube is placed in the area where you had surgery. A bulb is attached to the end of the tube that is outside the body. The bulb drain removes excess fluid that normally builds up in a surgical wound after surgery. The color and amount of fluid will vary. Immediately after surgery, the fluid is bright red and is a little thicker than water. It may gradually change to a yellow or pink color and become more thin and water-like. When the amount decreases to about 1 or 2 tbsp in 24 hours, your health care provider will usually remove it. DAILY CARE  Keep the bulb flat (compressed) at all times, except while emptying it. The flatness creates suction. You can flatten the bulb by squeezing it firmly in the middle and then closing the cap.  Keep sites where the tube enters the skin dry and covered with a bandage (dressing).  Secure the tube 1-2 in (2.5-5.1 cm) below the insertion sites to keep it from pulling on your stitches. The tube is stitched in place and will not slip out.  Secure the bulb as directed by your health care provider.  For the first 3 days after surgery, there usually is more fluid in the bulb. Empty the bulb whenever it becomes half full because the bulb does not create enough suction if it is too full. The bulb could also overflow. Write down how much fluid you remove each time you empty your drain. Add up the amount removed in 24 hours.  Empty the bulb at the same time every day once the amount of fluid decreases and you only need to empty it once a day. Write down the amounts and the 24-hour totals to give to your health care provider. This helps your health care provider know when the tubes can be removed. EMPTYING THE BULB DRAIN Before emptying the bulb, get a measuring cup, a piece of paper and a pen, and wash your hands.  Gently run your fingers down the  tube (stripping) to empty any drainage from the tubing into the bulb. This may need to be done several times a day to clear the tubing of clots and tissue.  Open the bulb cap to release suction, which causes it to inflate. Do not touch the inside of the cap.  Gently run your fingers down the tube (stripping) to empty any drainage from the tubing into the bulb.  Hold the cap out of the way, and pour fluid into the measuring cup.   Squeeze the bulb to provide suction.  Replace the cap.   Check the tape that holds the tube to your skin. If it is becoming loose, you can remove the loose piece of tape and apply a new one. Then, pin the bulb to your shirt.   Write down the amount of fluid you emptied out. Write down the date and each time you emptied your bulb drain. (If there are 2 bulbs, note the amount of drainage from each bulb and keep the totals separate. Your health care provider will want to know the total amounts for each drain and which tube is draining more.)   Flush the fluid down the toilet and wash your hands.   Call your health care provider once you have less than 2 tbsp of fluid collecting in the bulb drain every 24 hours. If  there is drainage around the tube site, change dressings and keep the area dry. Cleanse around tube with sterile saline and place dry gauze around site. This gauze should be changed when it is soiled. If it stays clean and unsoiled, it should still be changed daily.  SEEK MEDICAL CARE IF:  Your drainage has a bad smell or is cloudy.   You have a fever.   Your drainage is increasing instead of decreasing.   Your tube fell out.   You have redness or swelling around the tube site.   You have drainage from a surgical wound.   Your bulb drain will not stay flat after you empty it.  MAKE SURE YOU:   Understand these instructions.  Will watch your condition.  Will get help right away if you are not doing well or get worse. Document  Released: 08/15/2000 Document Revised: 01/02/2014 Document Reviewed: 01/21/2012 [facility] Patient Information 2015 [place], [place]. This information is not intended to replace advice given to you by your health care provider. Make sure you discuss any questions you have with your health care provider.

## 2015-03-08 NOTE — Progress Notes (Signed)
Percutaneous drain placed by CT guidance yesterday. Bloody fluid was noted. Final cultures pending. Patient still has some lower abdominal pain, but it seems to be more related to the exit site of the catheter. Serosanguineous fluid is noted within the tubing. Patient would like to be discharged, which is fine with me. She should continue on another one-week course of oral antibiotics. I will see her in my office next week for follow-up drain care.

## 2015-03-08 NOTE — Care Management (Signed)
Important Message  Patient Details  Name: Lindsay Mitchell MRN: [id number] Date of Birth: 1940/12/18   Medicare Important Message Given:  Yes-third notification given    Joylene Draft, RN 03/08/2015, 10:32 AM

## 2015-03-08 NOTE — Care Management Note (Signed)
Case Management Note  Patient Details  Name: Lindsay Mitchell MRN: [id number] Date of Birth: 1940-12-21  Subjective/Objective:                    Action/Plan:   Expected Discharge Date:                  Expected Discharge Plan:  [place]  In-House Referral:  NA  Discharge planning Services  CM Consult  Post Acute Care Choice:  Home Health Choice offered to:  Patient  DME Arranged:    DME Agency:     HH Arranged:  RN [place] Agency:  [place]  Status of Service:  Completed, signed off  Medicare Important Message Given:  Yes-third notification given Date Medicare IM Given:    Medicare IM give by:    Date Additional Medicare IM Given:    Additional Medicare Important Message give by:     If discussed at [place] of Stay Meetings, dates discussed:    Additional Comments: Anticipate discharge home today with [facility] RN (per pts choice). Romualdo Bolk of [facility] is aware and will collect the pts information from the chart. [facility] services to start within 48 hours. No DME needs noted. Pt and pts nurse aware of discharge arrangements. Christinia Gully [place], RN 03/08/2015, 10:33 AM

## 2015-03-08 NOTE — Discharge Summary (Signed)
Physician Discharge Summary  Lindsay Mitchell GEX:[id number] DOB: 1940/09/29 DOA: 02/28/2015  PCP: Glo Herring., MD  Admit date: 02/28/2015 Discharge date: 03/08/2015  Time spent: 35 minutes  Recommendations for Outpatient Follow-up:  1. Follow up with PCP in one week. 2. Follow up with Dr Arnoldo Morale in one week.   Discharge Diagnoses:  Principal Problem:   Diverticulitis of intestine with abscess Active Problems:   Constipation   COPD (chronic obstructive pulmonary disease)   Colonic diverticular abscess   Protein-calorie malnutrition, severe   Anemia   Fibromyalgia   Leg edema   HLD (hyperlipidemia)   Chronic respiratory failure with hypoxia   Orthostatic hypotension   Adrenal insufficiency   Discharge Condition: Improved.   Diet recommendation: cardiac diet.   Filed Weights   02/28/15 1851  Weight: 46.72 kg (103 lb)    History of present illness: patient was admitted for diverticular abscess by Dr Marily Memos on February 28, 2015.  As per his H and P:  "is a 74 y.o. female  Abd pain. Comes and goes. Worse w/ movement and during defecation. Improves partially 30 minutes after defecation. Going for 7 days. getting worse. Associated with nausea, constipation, fatigue. Patient only having bowel movements every 3 days. Some improvement in nausea with Pepto-Bismol. Poor oral intake during this period time. Last antibiotics for diverticulitis and C. difficile in early May.   Hospital Course: patient was admitted for acute sigmoid diverticulitis with abscess.  She was given IV Zosyn for the infection, and general surgery was consulted.  Dr Arnoldo Morale had seen her, who did not feel that she needed surgery, and antibiotic was continued.  She Was noted to have hypotension, and did not respond to IVF.  Cortisol level was drawn, and the level was low, queried adrenal insufficiency, and she was started on low dose Prednisone.  Her BP improved.  A follow up abdominal CT did not show any  improvement of the abscess, and IR was requested for drainage tube placement.  She was sent to [facility] and Dr Annamaria Boots or radiology successfully placed a drainage tube, with serosanguinous drainage obtained.  Dr Arnoldo Morale saw her in follow up, and felt she can be discharged with one week of oral antibiotics.  She will see her next week for tube check and she will see her PCP in one week for follow up as well.  She will be continue on low dose Prednisone at 5mg  per day with PPI, and will need to be reassessed for tapering of it.   She will be given Percocet PRN for pain along with PPI since she is on Prednisone.  She is stable for discharge, and will be discharged home today.  Thanks.  Procedures:  IR drainage tube placed 03/07/15 at [facility] radiology, Dr Annamaria Boots.   Consultations:  Radiology  Surgery.   Discharge Exam: Filed Vitals:   03/08/15 0450  BP: 102/60  Pulse: 71  Temp: 97.4 F (36.3 C)  Resp: 16    General: no abdominal pain, able to eat.  Cardiovascular: No CP or SOB.  Respiratory: No coughs, F or Chills.   Discharge Instructions   Discharge Instructions    Diet - low sodium heart healthy    Complete by:  As directed      Discharge instructions    Complete by:  As directed   Take your Prednisone with food, always.  Follow up with Dr Arnoldo Morale next week.   Follow up with your PCP in one week.  Ask your PCP  about how long you will need to stay on your Prednisone.     Increase activity slowly    Complete by:  As directed           Current Discharge Medication List    START taking these medications   Details  pantoprazole (PROTONIX) 40 MG tablet Take 1 tablet (40 mg total) by mouth daily. Qty: 30 tablet, Refills: 1    predniSONE (DELTASONE) 5 MG tablet Take 1 tablet (5 mg total) by mouth daily with breakfast. Qty: 30 tablet, Refills: 1      CONTINUE these medications which have CHANGED   Details  HYDROcodone-acetaminophen (NORCO) 10-325 MG per tablet Take 1 tablet by mouth every 6  (six) hours as needed for moderate pain or severe pain. Qty: 30 tablet, Refills: 0      CONTINUE these medications which have NOT CHANGED   Details  aspirin 81 MG chewable tablet Chew 1 tablet (81 mg total) by mouth daily. Qty: 30 tablet, Refills: 1    atorvastatin (LIPITOR) 80 MG tablet Take 1 tablet (80 mg total) by mouth daily at 6 PM. Qty: 30 tablet, Refills: 3    budesonide-formoterol (SYMBICORT) 80-4.5 MCG/ACT inhaler Inhale 2 puffs into the lungs 2 (two) times daily. Qty: 1 Inhaler, Refills: 12    gabapentin (NEURONTIN) 300 MG capsule Take 3 capsules (900 mg total) by mouth 3 (three) times daily. Qty: 270 capsule, Refills: 6   Associated Diagnoses: Trigeminal neuralgia    saccharomyces boulardii (FLORASTOR) 250 MG capsule Take 250 mg by mouth 2 (two) times daily.    albuterol (PROVENTIL HFA;VENTOLIN HFA) 108 (90 BASE) MCG/ACT inhaler Inhale 2 puffs into the lungs every 4 (four) hours as needed for wheezing or shortness of breath. Qty: 1 Inhaler, Refills: 12      STOP taking these medications     furosemide (LASIX) 20 MG tablet      ipratropium-albuterol (DUONEB) 0.5-2.5 (3) MG/3ML SOLN      Linaclotide (LINZESS) 145 MCG CAPS capsule      omeprazole (PRILOSEC) 20 MG capsule      potassium chloride (K-DUR) 10 MEQ tablet      furosemide (LASIX) 40 MG tablet        Allergies  Allergen Reactions  . Codeine Other (See Comments)    Makes patient not in right state of mind.   . Oxycodone Itching  . Talwin [Pentazocine] Other (See Comments)    Makes patient not in right state of mind.   . Valium [Diazepam] Other (See Comments)    Just doesn't work.    Follow-up Information    Follow up with Jamesetta So, MD On 03/15/2015.   Specialty:  General Surgery   Why:  at 10:15 am   Contact information:   [address] [facility] [address] [phone]       Follow up with [facility].   Contact information:   [address] [place] [address] [phone]        The results of significant diagnostics from this hospitalization (including imaging, microbiology, ancillary and laboratory) are listed below for reference.    Significant Diagnostic Studies: Dg Chest 2 View  02/28/2015   CLINICAL DATA:  Hypotension with nausea and vomiting  EXAM: CHEST  2 VIEW  COMPARISON:  February 14, 2015  FINDINGS: Lungs are mildly hyperexpanded. There is no edema or consolidation. The heart size and pulmonary vascularity are normal. There is atherosclerotic change in the aortic arch. No  bone lesions.  IMPRESSION: Lungs mildly hyperexpanded.  No edema or consolidation.   Electronically Signed   By: Lowella Grip III M.D.   On: 02/28/2015 13:25   Ct Abdomen Pelvis W Contrast  03/05/2015   CLINICAL DATA:  Followup of diverticular abscess.  EXAM: CT ABDOMEN AND PELVIS WITH CONTRAST  TECHNIQUE: Multidetector CT imaging of the abdomen and pelvis was performed using the standard protocol following bolus administration of intravenous contrast.  CONTRAST:  164mL OMNIPAQUE IOHEXOL 300 MG/ML  SOLN  COMPARISON:  02/28/2015 and 12/19/2014.  FINDINGS: Lower chest: A right lower lobe pulmonary nodule measures 6 mm on image 7 and is similar on the prior exam, but absent on 10/20/2012. Right base scarring or atelectasis. Heart size upper normal, without pericardial or pleural effusion.  Hepatobiliary: Suspicion of mild hepatic steatosis, without focal liver lesion. Normal gallbladder, without biliary ductal dilatation.  Pancreas: Pancreatic atrophy, without duct dilatation, mass, or acute inflammation.  Spleen: Normal  Adrenals/Urinary Tract: Normal adrenal glands. Dominant lower pole left renal cyst of nearly 7 cm. Other low-density bilateral renal lesions which are most consistent with cysts. A minimally complex cyst is identified within the medial interpolar left kidney, with a subtle septation identified on coronal image 51. No hydronephrosis. mild wall  thickening involving the superior left wall of the urinary bladder, including on coronal image 39. No air within to confirm fistulous communication. This wall thickening is new or increased.  Stomach/Bowel: Normal stomach, without wall thickening. Extensive colonic diverticulosis with muscular hypertrophy. Moderate inflammation surrounding the sigmoid is felt to be increased. A peripherally enhancing fluid collection within the adjacent mesocolon measures 3.3 x 2.7 cm on image 63 versus 2.9 x 1.8 cm on the prior exam. Tiny foci of gas are identified within.  No free perforation. Large stool burden within the right-sided the colon. Normal small bowel.  Vascular/Lymphatic: Aortic and branch vessel atherosclerosis. No abdominopelvic adenopathy.  Reproductive: Hysterectomy.  No adnexal mass.  Other: No significant free fluid.  Musculoskeletal: Mild osteopenia. Advanced degenerative disc disease at multiple lumbar levels.  IMPRESSION: 1. Slight increase in sigmoid diverticulitis with enlargement of a pericolonic fluid and gas collection consistent with abscess. 2. New or increased wall thickening within the adjacent bladder is mild and presumably reactive. No gas within the bladder to confirm fistulous communication. 3. 6 mm right lung base nodule. If the patient is at high risk for bronchogenic carcinoma, follow-up chest CT at 6-12 months is recommended. If the patient is at low risk for bronchogenic carcinoma, follow-up chest CT at 12 months is recommended. This recommendation follows the consensus statement: "Guidelines for Management of Small Pulmonary Nodules Detected on CT Scans: A Statement from the [place]" as published in Radiology2005;237:395-400. Online at: https://www.arnold.com/. 4.  Possible constipation.   Electronically Signed   By: Abigail Miyamoto M.D.   On: 03/05/2015 18:48   Ct Abdomen Pelvis W Contrast  02/28/2015   CLINICAL DATA:  Abdominal pain, nausea  vomiting x6 months worsened today. History of diverticulitis.  EXAM: CT ABDOMEN AND PELVIS WITH CONTRAST  TECHNIQUE: Multidetector CT imaging of the abdomen and pelvis was performed using the standard protocol following bolus administration of intravenous contrast.  CONTRAST:  188mL OMNIPAQUE IOHEXOL 300 MG/ML  SOLN  COMPARISON:  CT dated 12/19/2014  FINDINGS: Lower chest: Mild emphysematous changes. There has been interval resolution of the previously seen bilateral pleural effusions and consolidative changes of the lung bases. A 6 mm right lung base nodular density as well as linear  densities at the right lung base due to the the new goals of go care for the likely represent residual scarring.  Peritoneum: No free air or free fluid.  Liver: Unremarkable.No intrahepatic biliary ductal dilatation.  Gallbladder: Unremarkable. No calcified gallstone. No pericholecystic fluid.  Pancreas: Unremarkable.No ductal dilatation.  Spleen: Unremarkable.  Adrenals glands: Unremarkable.  Kidneys, ureters, urinary bladder: There multiple bilateral renal cysts measuring up to 7 cm in the inferior pole of the left kidney appear grossly stable compared to prior study. Scattered subcentimeter left renal hypodense lesions are too small to characterize. There is no hydronephrosis on either side. The urinary bladder is grossly unremarkable.  Reproductive: Hysterectomy.  Undo  Bowel and appendix: There is extensive sigmoid diverticulosis. There is segmental thickening and inflammatory changes of the sigmoid colon compatible with diverticulitis. A 2.9 x 1.8 cm loculated fluid collection/abscess is noted lateral to the sigmoid colon in the left hemipelvis which is new from the study dated 12/19/2014 at appears similar to the abscess seen on the study dated 12/13/2014. Constipation no bowel obstruction. The appendix is unremarkable.  Vascular/Lymphatic: There is aortoiliac atherosclerotic disease. No lymphadenopathy.  Abdominal  wall/Musculoskeletal: Degenerative changes of the spine. No acute fracture.  IMPRESSION: Sigmoid diverticulitis with diverticular abscess.  Constipation.  No bowel obstruction.   Electronically Signed   By: Anner Crete M.D.   On: 02/28/2015 16:06   Dg Chest Portable 1 View  02/14/2015   CLINICAL DATA:  Productive cough, congestion, shortness of breath for 1 week  EXAM: PORTABLE CHEST - 1 VIEW  COMPARISON:  12/26/2014  FINDINGS: Cardiomediastinal silhouette is stable. No acute infiltrate or pleural effusion. No pulmonary edema. Mild hyperinflation.  IMPRESSION: No active disease.  Mild hyperinflation.   Electronically Signed   By: Lahoma Crocker M.D.   On: 02/14/2015 09:50   Dg Abd Portable 1v  02/14/2015   CLINICAL DATA:  Abdominal pain and constipation for 8 days.  EXAM: PORTABLE ABDOMEN - 1 VIEW  COMPARISON:  CT scan 09/19/2014  FINDINGS: Mild to moderate stool throughout the colon and down into the rectum may suggest constipation. There are also nondistended air-filled loops of small bowel. The soft tissue shadows are maintained. No free air. The bony structures are intact.  IMPRESSION: Probable mild ileus and mild constipation.   Electronically Signed   By: Marijo Sanes M.D.   On: 02/14/2015 16:31   Ct Image Guided Drainage By Percutaneous Catheter  03/07/2015   CLINICAL DATA:  Left lower quadrant sigmoid diverticular abscess  EXAM: CT GUIDED DRAINAGE OF LEFT LOWER QUADRANT DIVERTICULAR ABSCESS  ANESTHESIA/SEDATION: 2.0 Mg IV Versed 200 mcg IV Fentanyl  Total Moderate Sedation Time:  15 minutes  PROCEDURE: The procedure, risks, benefits, and alternatives were explained to the patient. Questions regarding the procedure were encouraged and answered. The patient understands and consents to the procedure.  The left lower quadrant was prepped with Betadinein a sterile fashion, and a sterile drape was applied covering the operative field. A sterile gown and sterile gloves were used for the procedure. Local  anesthesia was provided with 1% Lidocaine.  Previous imaging reviewed. Patient positioned supine. Noncontrast localization CT performed. The small left lower quadrant sigmoid diverticular abscess just superior to the bladder was localized with noncontrast CT. Under sterile conditions and local anesthesia, an 18 gauge 15 cm introducer needle was advanced from an anterior oblique approach into the small abscess. Needle position confirmed with CT. Syringe aspiration yielded purulent fluid. Amplatz guidewire inserted followed by tract dilatation to  insert a 10 [place] drain. Drain catheter position confirmed within the abscess. Syringe aspiration yielded 10 cc bloody purulent fluid. Sample sent for Gram stain and culture. Catheter secured with a Prolene suture and connected to external suction bulb. Sterile dressing applied. No immediate complication. Patient tolerated the procedure well.  COMPLICATIONS: None immediate  FINDINGS: CT imaging confirms needle access of the left lower quadrant small diverticular abscess for drain insertion  IMPRESSION: Successful CT-guided left lower quadrant 10 French abscess drain insertion   Electronically Signed   By: Jerilynn Mages.  Shick M.D.   On: 03/07/2015 12:31    Microbiology: Recent Results (from the past 240 hour(s))  Culture, routine-abscess     Status: None (Preliminary result)   Collection Time: 03/07/15 12:45 PM  Result Value Ref Range Status   Specimen Description ABSCESS  Final   Special Requests PELVIS  Final   Gram Stain   Final    ABUNDANT WBC PRESENT, PREDOMINANTLY PMN NO SQUAMOUS EPITHELIAL CELLS SEEN NO ORGANISMS SEEN Performed at [facility]    Culture   Final    Culture reincubated for better growth Performed at [facility]    Report Status PENDING  Incomplete  Anaerobic culture     Status: None (Preliminary result)   Collection Time: 03/07/15 12:45 PM  Result Value Ref Range Status   Specimen Description ABSCESS  Final   Special  Requests PELVIS  Final   Gram Stain PENDING  Incomplete   Culture   Final    NO ANAEROBES ISOLATED; CULTURE IN PROGRESS FOR 5 DAYS Performed at [facility]    Report Status PENDING  Incomplete     Labs: Basic Metabolic Panel:  Recent Labs Lab 03/02/15 0623 03/03/15 0608 03/05/15 0612 03/06/15 0608  NA 140 137 139 139  K 3.4* 4.4 4.3 4.1  CL 100* 98* 99* 99*  CO2 30 31 34* 31  GLUCOSE 104* 86 98 139*  BUN 17 12 14 13   CREATININE 0.70 0.74 0.78 0.72  CALCIUM 8.1* 8.7* 8.6* 8.5*  MG  --  1.9  --   --    Liver Function Tests: No results for input(s): AST, ALT, ALKPHOS, BILITOT, PROT, ALBUMIN in the last 168 hours. No results for input(s): LIPASE, AMYLASE in the last 168 hours. No results for input(s): AMMONIA in the last 168 hours. CBC:  Recent Labs Lab 03/02/15 0623 03/03/15 0608 03/04/15 0605 03/05/15 0612 03/06/15 0608  WBC 14.0* 12.4* 11.2* 8.1 6.2  HGB 10.0* 10.3* 10.2* 10.2* 9.5*  HCT 31.7* 32.8* 33.0* 32.7* 30.5*  MCV 96.6 97.0 97.6 97.9 96.8  PLT 436* 438* 458* 416* 423*   Cardiac Enzymes: No results for input(s): CKTOTAL, CKMB, CKMBINDEX, TROPONINI in the last 168 hours. BNP: BNP (last 3 results)  Recent Labs  12/23/14 0515 02/14/15 0949  BNP 1234.6* 69.0    Signed:  Deziya Amero  [facility] 03/08/2015, 12:19 PM

## 2015-03-08 NOTE — Progress Notes (Signed)
1306 D/C instructions, paperwork and hard Rx given to patient and patient's husband. Patient able to demonstrate how to open and empty ABD JP drain and compress bulb when closing back up for suction purposes. Patient acknowledged being aware to f/u with Dr.Jenkins on 03/15/15 as scheduled & to make a f/u with PCP d/t prednisone use. IV catheter removed from LEFT forearm, catheter intact, no s/s of infection noted, patient tolerated well with no c/o pain or discomfort noted. Patient assisted to vehicle via w/c by staff.

## 2015-03-09 DIAGNOSIS — Z87891 Personal history of nicotine dependence: Secondary | ICD-10-CM | POA: Diagnosis not present

## 2015-03-09 DIAGNOSIS — D649 Anemia, unspecified: Secondary | ICD-10-CM | POA: Diagnosis not present

## 2015-03-09 DIAGNOSIS — I2510 Atherosclerotic heart disease of native coronary artery without angina pectoris: Secondary | ICD-10-CM | POA: Diagnosis not present

## 2015-03-09 DIAGNOSIS — J449 Chronic obstructive pulmonary disease, unspecified: Secondary | ICD-10-CM | POA: Diagnosis not present

## 2015-03-09 DIAGNOSIS — K5720 Diverticulitis of large intestine with perforation and abscess without bleeding: Secondary | ICD-10-CM | POA: Diagnosis not present

## 2015-03-09 DIAGNOSIS — Z7952 Long term (current) use of systemic steroids: Secondary | ICD-10-CM | POA: Diagnosis not present

## 2015-03-09 DIAGNOSIS — I252 Old myocardial infarction: Secondary | ICD-10-CM | POA: Diagnosis not present

## 2015-03-09 DIAGNOSIS — Z8673 Personal history of transient ischemic attack (TIA), and cerebral infarction without residual deficits: Secondary | ICD-10-CM | POA: Diagnosis not present

## 2015-03-09 DIAGNOSIS — E785 Hyperlipidemia, unspecified: Secondary | ICD-10-CM | POA: Diagnosis not present

## 2015-03-09 DIAGNOSIS — Z9981 Dependence on supplemental oxygen: Secondary | ICD-10-CM | POA: Diagnosis not present

## 2015-03-10 DIAGNOSIS — Z8673 Personal history of transient ischemic attack (TIA), and cerebral infarction without residual deficits: Secondary | ICD-10-CM | POA: Diagnosis not present

## 2015-03-10 DIAGNOSIS — E785 Hyperlipidemia, unspecified: Secondary | ICD-10-CM | POA: Diagnosis not present

## 2015-03-10 DIAGNOSIS — D649 Anemia, unspecified: Secondary | ICD-10-CM | POA: Diagnosis not present

## 2015-03-10 DIAGNOSIS — I252 Old myocardial infarction: Secondary | ICD-10-CM | POA: Diagnosis not present

## 2015-03-10 DIAGNOSIS — Z87891 Personal history of nicotine dependence: Secondary | ICD-10-CM | POA: Diagnosis not present

## 2015-03-10 DIAGNOSIS — J449 Chronic obstructive pulmonary disease, unspecified: Secondary | ICD-10-CM | POA: Diagnosis not present

## 2015-03-10 DIAGNOSIS — I2510 Atherosclerotic heart disease of native coronary artery without angina pectoris: Secondary | ICD-10-CM | POA: Diagnosis not present

## 2015-03-10 DIAGNOSIS — K5720 Diverticulitis of large intestine with perforation and abscess without bleeding: Secondary | ICD-10-CM | POA: Diagnosis not present

## 2015-03-10 DIAGNOSIS — Z9981 Dependence on supplemental oxygen: Secondary | ICD-10-CM | POA: Diagnosis not present

## 2015-03-10 DIAGNOSIS — Z7952 Long term (current) use of systemic steroids: Secondary | ICD-10-CM | POA: Diagnosis not present

## 2015-03-10 MED ORDER — AMOXICILLIN-POT CLAVULANATE 875-125 MG PO TABS: 1.0000 | ORAL_TABLET | Freq: Two times a day (BID) | ORAL | Status: DC

## 2015-03-12 LAB — CULTURE, ROUTINE-ABSCESS

## 2015-03-12 LAB — ANAEROBIC CULTURE

## 2015-03-13 DIAGNOSIS — K5720 Diverticulitis of large intestine with perforation and abscess without bleeding: Secondary | ICD-10-CM | POA: Diagnosis not present

## 2015-03-15 DIAGNOSIS — E785 Hyperlipidemia, unspecified: Secondary | ICD-10-CM | POA: Diagnosis not present

## 2015-03-15 DIAGNOSIS — Z8673 Personal history of transient ischemic attack (TIA), and cerebral infarction without residual deficits: Secondary | ICD-10-CM | POA: Diagnosis not present

## 2015-03-15 DIAGNOSIS — K5720 Diverticulitis of large intestine with perforation and abscess without bleeding: Secondary | ICD-10-CM | POA: Diagnosis not present

## 2015-03-15 DIAGNOSIS — I252 Old myocardial infarction: Secondary | ICD-10-CM | POA: Diagnosis not present

## 2015-03-15 DIAGNOSIS — D649 Anemia, unspecified: Secondary | ICD-10-CM | POA: Diagnosis not present

## 2015-03-15 DIAGNOSIS — I2510 Atherosclerotic heart disease of native coronary artery without angina pectoris: Secondary | ICD-10-CM | POA: Diagnosis not present

## 2015-03-15 DIAGNOSIS — Z87891 Personal history of nicotine dependence: Secondary | ICD-10-CM | POA: Diagnosis not present

## 2015-03-15 DIAGNOSIS — Z9981 Dependence on supplemental oxygen: Secondary | ICD-10-CM | POA: Diagnosis not present

## 2015-03-15 DIAGNOSIS — Z7952 Long term (current) use of systemic steroids: Secondary | ICD-10-CM | POA: Diagnosis not present

## 2015-03-15 DIAGNOSIS — J449 Chronic obstructive pulmonary disease, unspecified: Secondary | ICD-10-CM | POA: Diagnosis not present

## 2015-03-20 DIAGNOSIS — I2510 Atherosclerotic heart disease of native coronary artery without angina pectoris: Secondary | ICD-10-CM | POA: Diagnosis not present

## 2015-03-20 DIAGNOSIS — D649 Anemia, unspecified: Secondary | ICD-10-CM | POA: Diagnosis not present

## 2015-03-20 DIAGNOSIS — K5720 Diverticulitis of large intestine with perforation and abscess without bleeding: Secondary | ICD-10-CM | POA: Diagnosis not present

## 2015-03-20 DIAGNOSIS — I252 Old myocardial infarction: Secondary | ICD-10-CM | POA: Diagnosis not present

## 2015-03-20 DIAGNOSIS — Z8673 Personal history of transient ischemic attack (TIA), and cerebral infarction without residual deficits: Secondary | ICD-10-CM | POA: Diagnosis not present

## 2015-03-20 DIAGNOSIS — J449 Chronic obstructive pulmonary disease, unspecified: Secondary | ICD-10-CM | POA: Diagnosis not present

## 2015-03-20 DIAGNOSIS — Z9981 Dependence on supplemental oxygen: Secondary | ICD-10-CM | POA: Diagnosis not present

## 2015-03-20 DIAGNOSIS — Z87891 Personal history of nicotine dependence: Secondary | ICD-10-CM | POA: Diagnosis not present

## 2015-03-20 DIAGNOSIS — Z7952 Long term (current) use of systemic steroids: Secondary | ICD-10-CM | POA: Diagnosis not present

## 2015-03-20 DIAGNOSIS — E785 Hyperlipidemia, unspecified: Secondary | ICD-10-CM | POA: Diagnosis not present

## 2015-03-22 ENCOUNTER — Encounter: Payer: Self-pay | Admitting: Cardiology

## 2015-03-22 ENCOUNTER — Ambulatory Visit (INDEPENDENT_AMBULATORY_CARE_PROVIDER_SITE_OTHER): Payer: Medicare Other | Admitting: Cardiology

## 2015-03-22 VITALS — BP 114/70 | HR 88 | Ht 63.0 in | Wt 107.0 lb

## 2015-03-22 DIAGNOSIS — I2510 Atherosclerotic heart disease of native coronary artery without angina pectoris: Secondary | ICD-10-CM

## 2015-03-22 DIAGNOSIS — I5032 Chronic diastolic (congestive) heart failure: Secondary | ICD-10-CM | POA: Diagnosis not present

## 2015-03-22 NOTE — Progress Notes (Signed)
Patient ID: Lindsay Mitchell, female   DOB: [birth year], 74 y.o.   MRN: [id number]     Clinical Summary Ms. Amparo is a 74 y.o.female seen today or follow up of the following medical problems.  1. CAD - recent admission with diverticular abscess and hypotension, developed elevated troponin - cath showed moderate non-obstructive disease - denies any recent chest pain  2. Chronic diastolic heart failure - denies any recent SOB, no LE edema  3. COPD - uses home O2 prn   Past Medical History  Diagnosis Date  . Constipation   . Depression with anxiety   . Diverticulosis   . DVT (deep venous thrombosis)   . COPD (chronic obstructive pulmonary disease)     Emphysema radiographically  . Carotid artery disease     a. duplex 10/1538: RICA <08%, LICA 67-61%. Followed by [facility].  . Anxiety   . TIA (transient ischemic attack)   . Full dentures   . Pulmonary nodules 08/11/2013    a. CT angio 08/2013: 4-16mm nodules in RUL/RML, f/u recommended 6 months.  . Migraine   . Fibromyalgia   . Stroke   . Protein calorie malnutrition   . First degree AV block   . On home O2   . Myocardial infarction      Allergies  Allergen Reactions  . Codeine Other (See Comments)    Makes patient not in right state of mind.   . Oxycodone Itching  . Talwin [Pentazocine] Other (See Comments)    Makes patient not in right state of mind.   . Valium [Diazepam] Other (See Comments)    Just doesn't work.      Current Outpatient Prescriptions  Medication Sig Dispense Refill  . albuterol (PROVENTIL HFA;VENTOLIN HFA) 108 (90 BASE) MCG/ACT inhaler Inhale 2 puffs into the lungs every 4 (four) hours as needed for wheezing or shortness of breath. (Patient not taking: Reported on 02/28/2015) 1 Inhaler 12  . amoxicillin-clavulanate (AUGMENTIN) 875-125 MG per tablet Take 1 tablet by mouth every 12 (twelve) hours. 14 tablet 0  . aspirin 81 MG chewable tablet Chew 1 tablet (81 mg total) by mouth daily. 30 tablet 1  .  atorvastatin (LIPITOR) 80 MG tablet Take 1 tablet (80 mg total) by mouth daily at 6 PM. 30 tablet 3  . budesonide-formoterol (SYMBICORT) 80-4.5 MCG/ACT inhaler Inhale 2 puffs into the lungs 2 (two) times daily. 1 Inhaler 12  . gabapentin (NEURONTIN) 300 MG capsule Take 3 capsules (900 mg total) by mouth 3 (three) times daily. 270 capsule 6  . HYDROcodone-acetaminophen (NORCO) 10-325 MG per tablet Take 1 tablet by mouth every 6 (six) hours as needed for moderate pain or severe pain. 30 tablet 0  . pantoprazole (PROTONIX) 40 MG tablet Take 1 tablet (40 mg total) by mouth daily. 30 tablet 1  . predniSONE (DELTASONE) 5 MG tablet Take 1 tablet (5 mg total) by mouth daily with breakfast. 30 tablet 1  . saccharomyces boulardii (FLORASTOR) 250 MG capsule Take 250 mg by mouth 2 (two) times daily.     No current facility-administered medications for this visit.     Past Surgical History  Procedure Laterality Date  . Abdominal hysterectomy  1971  . Back surgery      X3  . Colonoscopy  June 2002    Dr. Ferdinand Lango: severe diverticular disease with haustral hypertrophy, primary and secondary diverticulosis, persistent spasticity, early stenosis  . Colonoscopy with propofol N/A 06/21/2013    Dr. Oneida Alar: sigmoid colon and descending  colon with diverticula, small internal hemorrhoids  . Tonsillectomy    . Appendectomy    . Eye surgery      both cataracts-lenses  . Nasal sinus surgery Right 07/11/2013    Procedure: RIGHT ENDOSCOPIC ANTERIOR ETHMOIDECTOMY/RIGHT ENDOSCOPIC MAXILLARY ANTROSTOMY WITH REMOVAL OF TISSUE;  Surgeon: Ascencion Dike, MD;  Location: [facility];  Service: ENT;  Laterality: Right;  . Esophagogastroduodenoscopy N/A 03/06/2014    Procedure: ESOPHAGOGASTRODUODENOSCOPY (EGD);  Surgeon: Danie Binder, MD;  Location: [facility];  Service: Endoscopy;  Laterality: N/A;  9;30  . Savory dilation N/A 03/06/2014    Procedure: SAVORY DILATION;  Surgeon: Danie Binder, MD;  Location: [facility];  Service: Endoscopy;  Laterality: N/A;  . Esophageal biopsy  03/06/2014    Procedure: BIOPSY;  Surgeon: Danie Binder, MD;  Location: [facility];  Service: Endoscopy;;  . Other surgical history      scar tissue removal x2  . Left heart catheterization with coronary angiogram N/A 12/21/2014    Procedure: LEFT HEART CATHETERIZATION WITH CORONARY ANGIOGRAM;  Surgeon: Belva Crome, MD;  Location: [facility] CATH LAB;  Service: Cardiovascular;  Laterality: N/A;     Allergies  Allergen Reactions  . Codeine Other (See Comments)    Makes patient not in right state of mind.   . Oxycodone Itching  . Talwin [Pentazocine] Other (See Comments)    Makes patient not in right state of mind.   . Valium [Diazepam] Other (See Comments)    Just doesn't work.       Family History  Problem Relation Age of Onset  . Colon cancer Neg Hx   . Breast cancer Mother   . Heart disease Mother   . Hypertension Mother   . Heart attack Mother   . Other Mother     varicose veins  . Cancer Mother   . Varicose Veins Mother   . Diabetes Brother   . Hypertension Brother   . Heart disease Brother   . Throat cancer Father   . Cancer Father      Social History Ms. Dungan reports that she quit smoking about 3 months ago. Her smoking use included Cigarettes. She has a 50 pack-year smoking history. She does not have any smokeless tobacco history on file. Ms. Villeda reports that she does not drink alcohol.   Review of Systems CONSTITUTIONAL: No weight loss, fever, chills, weakness or fatigue.  HEENT: Eyes: No visual loss, blurred vision, double vision or yellow sclerae.No hearing loss, sneezing, congestion, runny nose or sore throat.  SKIN: No rash or itching.  CARDIOVASCULAR: per HPI RESPIRATORY: No shortness of breath, cough or sputum.  GASTROINTESTINAL: No anorexia, nausea, vomiting or diarrhea. No abdominal pain or blood.  GENITOURINARY: No burning on urination, no polyuria NEUROLOGICAL: No headache,  dizziness, syncope, paralysis, ataxia, numbness or tingling in the extremities. No change in bowel or bladder control.  MUSCULOSKELETAL: No muscle, back pain, joint pain or stiffness.  LYMPHATICS: No enlarged nodes. No history of splenectomy.  PSYCHIATRIC: No history of depression or anxiety.  ENDOCRINOLOGIC: No reports of sweating, cold or heat intolerance. No polyuria or polydipsia.  Marland Kitchen   Physical Examination Filed Vitals:   03/22/15 1104  BP: 114/70  Pulse: 88   Filed Vitals:   03/22/15 1104  Height: 5\' 3"  (1.6 m)  Weight: 107 lb (48.535 kg)    Gen: resting comfortably, no acute distress HEENT: no scleral icterus, pupils equal round and reactive, no palptable  cervical adenopathy,  CV: RRR, no m/r/g, no JVD Resp: Clear to auscultation bilaterally GI: abdomen is soft, non-tender, non-distended, normal bowel sounds, no hepatosplenomegaly MSK: extremities are warm, no edema.  Skin: warm, no rash Neuro:  no focal deficits Psych: appropriate affect   Diagnostic Studies 10/2014 echo Study Conclusions  - Left ventricle: The cavity size was normal. Wall thickness was normal. Systolic function was normal. The estimated ejection fraction was in the range of 55% to 60%. Doppler parameters are consistent with abnormal left ventricular relaxation (grade 1 diastolic dysfunction). - Aortic valve: Mildly calcified annulus. Trileaflet; normal thickness leaflets. Valve area (VTI): 2.97 cm^2. Valve area (Vmax): 2.85 cm^2. - Inferior vena cava: The vessel was dilated. The respirophasic diameter changes were blunted (< 50%), consistent with elevated central venous pressure. - Technically difficult study.   12/2014 Cath HEMODYNAMICS: Aortic pressure 114/62 mmHg; LV pressure 1:15/7 mmHg; LVEDP 14 mmHg  ANGIOGRAPHIC DATA: The left main coronary artery is calcified but widely patent.  The left anterior descending artery is widely patent and wraps around the left  ventricular apex. The proximal segment has heavy calcification. Within the proximal calcified segment there is ectasia. No obstruction is noted. Tortuosity is noted in the mid and distal LAD beyond the diagonal. No obstruction is seen..  The left circumflex artery is moderately calcified. 4 obtuse marginals arise from the circumflex. No significant obstruction is seen..  The right coronary artery is large vessel giving origin to a tortuous PDA. The proximal RCA contains 40% narrowing. Irregularities are noted in the mid vessel.Marland Kitchen  LEFT VENTRICULOGRAM: Left ventricular angiogram was done in the 30 RAO projection and revealed normal sized LV cavity with an EF of 55%.   IMPRESSIONS: 1. Moderate to heavy three-vessel coronary calcification  2. No significant obstructive coronary disease is noted. There is an eccentric 40% proximal RCA.  3. Overall normal LV function with EF 55% and no regional wall motion abnormality   RECOMMENDATION: Further management per treating team.    Assessment and Plan  1. CAD - moderate nonobstructive disease by cath - no recent symptoms - continue risk factor modification  2. Chronic diastolic heart failure - appears euvolemic, continue current meds  F/u 6 months      Arnoldo Lenis, M.D

## 2015-03-22 NOTE — Patient Instructions (Signed)
Your physician wants you to follow-up in: 6 months You will receive a reminder letter in the mail two months in advance. If you don't receive a letter, please call our office to schedule the follow-up appointment.     Your physician recommends that you continue on your current medications as directed. Please refer to the Current Medication list given to you today.      Thank you for choosing [facility] !        

## 2015-03-27 DIAGNOSIS — I2510 Atherosclerotic heart disease of native coronary artery without angina pectoris: Secondary | ICD-10-CM | POA: Diagnosis not present

## 2015-03-27 DIAGNOSIS — D649 Anemia, unspecified: Secondary | ICD-10-CM | POA: Diagnosis not present

## 2015-03-27 DIAGNOSIS — Z87891 Personal history of nicotine dependence: Secondary | ICD-10-CM | POA: Diagnosis not present

## 2015-03-27 DIAGNOSIS — I252 Old myocardial infarction: Secondary | ICD-10-CM | POA: Diagnosis not present

## 2015-03-27 DIAGNOSIS — Z7952 Long term (current) use of systemic steroids: Secondary | ICD-10-CM | POA: Diagnosis not present

## 2015-03-27 DIAGNOSIS — E785 Hyperlipidemia, unspecified: Secondary | ICD-10-CM | POA: Diagnosis not present

## 2015-03-27 DIAGNOSIS — K5720 Diverticulitis of large intestine with perforation and abscess without bleeding: Secondary | ICD-10-CM | POA: Diagnosis not present

## 2015-03-27 DIAGNOSIS — Z8673 Personal history of transient ischemic attack (TIA), and cerebral infarction without residual deficits: Secondary | ICD-10-CM | POA: Diagnosis not present

## 2015-03-27 DIAGNOSIS — J449 Chronic obstructive pulmonary disease, unspecified: Secondary | ICD-10-CM | POA: Diagnosis not present

## 2015-03-27 DIAGNOSIS — Z9981 Dependence on supplemental oxygen: Secondary | ICD-10-CM | POA: Diagnosis not present

## 2015-03-28 DIAGNOSIS — Z681 Body mass index (BMI) 19 or less, adult: Secondary | ICD-10-CM | POA: Diagnosis not present

## 2015-03-28 DIAGNOSIS — Z0001 Encounter for general adult medical examination with abnormal findings: Secondary | ICD-10-CM | POA: Diagnosis not present

## 2015-03-28 DIAGNOSIS — J449 Chronic obstructive pulmonary disease, unspecified: Secondary | ICD-10-CM | POA: Diagnosis not present

## 2015-03-28 DIAGNOSIS — K5732 Diverticulitis of large intestine without perforation or abscess without bleeding: Secondary | ICD-10-CM | POA: Diagnosis not present

## 2015-03-28 DIAGNOSIS — I10 Essential (primary) hypertension: Secondary | ICD-10-CM | POA: Diagnosis not present

## 2015-03-28 DIAGNOSIS — K219 Gastro-esophageal reflux disease without esophagitis: Secondary | ICD-10-CM | POA: Diagnosis not present

## 2015-03-29 DIAGNOSIS — J449 Chronic obstructive pulmonary disease, unspecified: Secondary | ICD-10-CM | POA: Diagnosis not present

## 2015-04-02 ENCOUNTER — Ambulatory Visit (INDEPENDENT_AMBULATORY_CARE_PROVIDER_SITE_OTHER): Payer: Medicare Other | Admitting: Internal Medicine

## 2015-04-02 ENCOUNTER — Encounter (INDEPENDENT_AMBULATORY_CARE_PROVIDER_SITE_OTHER): Payer: Self-pay | Admitting: Internal Medicine

## 2015-04-02 VITALS — BP 90/62 | HR 84 | Temp 98.0°F | Ht 63.0 in | Wt 108.9 lb

## 2015-04-02 DIAGNOSIS — K5792 Diverticulitis of intestine, part unspecified, without perforation or abscess without bleeding: Secondary | ICD-10-CM | POA: Diagnosis not present

## 2015-04-02 DIAGNOSIS — K5732 Diverticulitis of large intestine without perforation or abscess without bleeding: Secondary | ICD-10-CM | POA: Diagnosis not present

## 2015-04-02 NOTE — Patient Instructions (Signed)
CT abdomen/pelvis with CM.  

## 2015-04-02 NOTE — Progress Notes (Addendum)
Subjective:    Patient ID: Lindsay Mitchell, female    DOB: July 01, 1941, 74 y.o.   MRN: [id number]  HPI Here today for f/o. She  Has a hx of sigmoid diverticulitis with t IR drainage at [facility] radiology, Dr Annamaria Boots. She was discharged from [facility] 03/08/2015. She was covered with ? Antibiotic.  Admitted to [facility] after she was seen in our office 02/28/2015. She was hypotensive and tachycardiac. She was having abdominal pain.  She tells me today she is doing better. The drain was removed 3 weeks ago by Dr. Arnoldo Morale. She has not had a fever.  Her appetite is better. She has gained about 6 pounds since her visit in June. No nausea or vomiting.  She says she saw blood when she wiped last night . This is the first time she has seen any blood.  She describes a bright red. Since last night she has had some rt sided abdominal pain.  She rates the pain 3/10.  She says she has had this pain for years.  CT scan 02/28/2015 revealed: IMPRESSION: Sigmoid diverticulitis with diverticular abscess. Her last colonoscopy was in 2014 by Dr. Barney Drain which revealed normal mucosa in tahe terminal ileum. Moderate diverticulosis in the sigmoid colon and descending colon. Small internal hemorrhoids.  The colon is redundant and angulated at the rectosigmoid junction (10cm)   03/07/2015  FINDINGS: CT imaging confirms needle access of the left lower quadrant small diverticular abscess for drain insertion  IMPRESSION: Successful CT-guided left lower quadrant 10 French abscess drain   12/13/2014 CT abdomen/pelvis with CM:  IMPRESSION: Sigmoid diverticulosis. 2 cm fluid collection adjacent to the sigmoid colon in the left pelvis. While this could reflect a small fluid collection/abscess related to diverticulitis, this also may represent a small cystic structure within the left ovary, but is new since 2014. I favor this is a small diverticular abscess. Consider treatment and repeat short-term imaging.  Follow up CT  abdomen/pelvis with CM 12/18/2013 revealed : IMPRESSION: Improvement in diverticulitis. Resolution of diverticular abscess in the sigmoid region. Interval development of pericholecystic fluid. No gallbladder thickening or gallstones. Correlate with pain in this area and possible ultrasound if indicated  Interval development of bilateral pleural effusions and bibasilar  PROCEDURE DATE: 03/06/2014 dysphagia, dyspepsia PROCEDURE: EGD with biopsy and EGD with dilatation over guidewire Dr. Oneida Alar ENDOSCOPIC IMPRESSION: 1. Schatzki ring at the gastroesophageal junction 2. MILD Non-erosive gastritis   Review of Systems Past Medical History  Diagnosis Date  . Constipation   . Depression with anxiety   . Diverticulosis   . DVT (deep venous thrombosis)   . COPD (chronic obstructive pulmonary disease)     Emphysema radiographically  . Carotid artery disease     a. duplex 05/7740: RICA <42%, LICA 39-53%. Followed by [facility].  . Anxiety   . TIA (transient ischemic attack)   . Full dentures   . Pulmonary nodules 08/11/2013    a. CT angio 08/2013: 4-56mm nodules in RUL/RML, f/u recommended 6 months.  . Migraine   . Fibromyalgia   . Stroke   . Protein calorie malnutrition   . First degree AV block   . On home O2   . Myocardial infarction     Past Surgical History  Procedure Laterality Date  . Abdominal hysterectomy  1971  . Back surgery      X3  . Colonoscopy  June 2002    Dr. Ferdinand Lango: severe diverticular disease with haustral hypertrophy, primary and secondary diverticulosis, persistent spasticity, early  stenosis  . Colonoscopy with propofol N/A 06/21/2013    Dr. Oneida Alar: sigmoid colon and descending colon with diverticula, small internal hemorrhoids  . Tonsillectomy    . Appendectomy    . Eye surgery      both cataracts-lenses  . Nasal sinus surgery Right 07/11/2013    Procedure: RIGHT ENDOSCOPIC ANTERIOR ETHMOIDECTOMY/RIGHT ENDOSCOPIC MAXILLARY ANTROSTOMY WITH REMOVAL OF  TISSUE;  Surgeon: Ascencion Dike, MD;  Location: [place];  Service: ENT;  Laterality: Right;  . Esophagogastroduodenoscopy N/A 03/06/2014    Procedure: ESOPHAGOGASTRODUODENOSCOPY (EGD);  Surgeon: Danie Binder, MD;  [facility];  Service: Endoscopy;  Laterality: N/A;  9;30  . Savory dilation N/A 03/06/2014    Procedure: SAVORY DILATION;  Surgeon: Danie Binder, MD;  [facility];  Service: Endoscopy;  Laterality: N/A;  . Esophageal biopsy  03/06/2014    Procedure: BIOPSY;  Surgeon: Danie Binder, MD;  [facility];  Service: Endoscopy;;  . Other surgical history      scar tissue removal x2  . Left heart catheterization with coronary angiogram N/A 12/21/2014    Procedure: LEFT HEART CATHETERIZATION WITH CORONARY ANGIOGRAM;  Surgeon: Belva Crome, MD;  Location: [facility] CATH LAB;  Service: Cardiovascular;  Laterality: N/A;    Allergies  Allergen Reactions  . Codeine Other (See Comments)    Makes patient not in right state of mind.   . Oxycodone Itching  . Talwin [Pentazocine] Other (See Comments)    Makes patient not in right state of mind.   . Valium [Diazepam] Other (See Comments)    Just doesn't work.     Current Outpatient Prescriptions on File Prior to Visit  Medication Sig Dispense Refill  . albuterol (PROVENTIL HFA;VENTOLIN HFA) 108 (90 BASE) MCG/ACT inhaler Inhale 2 puffs into the lungs every 4 (four) hours as needed for wheezing or shortness of breath. 1 Inhaler 12  . aspirin 81 MG chewable tablet Chew 1 tablet (81 mg total) by mouth daily. 30 tablet 1  . atorvastatin (LIPITOR) 80 MG tablet Take 1 tablet (80 mg total) by mouth daily at 6 PM. 30 tablet 3  . budesonide-formoterol (SYMBICORT) 80-4.5 MCG/ACT inhaler Inhale 2 puffs into the lungs 2 (two) times daily. 1 Inhaler 12  . furosemide (LASIX) 40 MG tablet   0  . gabapentin (NEURONTIN) 300 MG capsule Take 3 capsules (900 mg total) by mouth 3 (three) times daily. 270 capsule 6  .  HYDROcodone-acetaminophen (NORCO) 10-325 MG per tablet Take 1 tablet by mouth every 6 (six) hours as needed for moderate pain or severe pain. 30 tablet 0  . ipratropium-albuterol (DUONEB) 0.5-2.5 (3) MG/3ML SOLN INHALE CONTENTS OF 1 VIAL VIA NEBULIZER EVERY 6 HOURS  5  . pantoprazole (PROTONIX) 40 MG tablet Take 1 tablet (40 mg total) by mouth daily. 30 tablet 1  . potassium chloride (K-DUR) 10 MEQ tablet TK 2 TS PO D  3  . predniSONE (DELTASONE) 5 MG tablet Take 1 tablet (5 mg total) by mouth daily with breakfast. 30 tablet 1  . saccharomyces boulardii (FLORASTOR) 250 MG capsule Take 250 mg by mouth 2 (two) times daily.     No current facility-administered medications on file prior to visit.        Objective:   Physical ExamBlood pressure 90/62, pulse 84, temperature 98 F (36.7 C), height 5\' 3"  (1.6 m), weight 108 lb 14.4 oz (49.397 kg). Alert and oriented. Skin warm and dry. Oral mucosa is  moist.   . Sclera anicteric, conjunctivae is pink. Thyroid not enlarged. No cervical lymphadenopathy. Lungs clear. Heart regular rate and rhythm.  Abdomen is soft. Bowel sounds are positive. No hepatomegaly. No abdominal masses felt. No tenderness.  No edema to lower extremities.   Patient looks 100% today.         Assessment & Plan:  Hx of diverticulitis with abscess. Need to document healing. Will repeat CT abdomen/pelvis with CM.  CBC with diff.

## 2015-04-03 LAB — CBC WITH DIFFERENTIAL/PLATELET
Basophils Absolute: 0 10*3/uL (ref 0.0–0.1)
Basophils Relative: 0 % (ref 0–1)
Eosinophils Absolute: 0.1 10*3/uL (ref 0.0–0.7)
Eosinophils Relative: 1 % (ref 0–5)
HCT: 36 % (ref 36.0–46.0)
Hemoglobin: 11.8 g/dL — ABNORMAL LOW (ref 12.0–15.0)
Lymphocytes Relative: 15 % (ref 12–46)
Lymphs Abs: 1.4 10*3/uL (ref 0.7–4.0)
MCH: 30.3 pg (ref 26.0–34.0)
MCHC: 32.8 g/dL (ref 30.0–36.0)
MCV: 92.3 fL (ref 78.0–100.0)
MPV: 10.7 fL (ref 8.6–12.4)
Monocytes Absolute: 0.8 10*3/uL (ref 0.1–1.0)
Monocytes Relative: 8 % (ref 3–12)
Neutro Abs: 7.2 10*3/uL (ref 1.7–7.7)
Neutrophils Relative %: 76 % (ref 43–77)
Platelets: 218 10*3/uL (ref 150–400)
RBC: 3.9 MIL/uL (ref 3.87–5.11)
RDW: 15.4 % (ref 11.5–15.5)
WBC: 9.5 10*3/uL (ref 4.0–10.5)

## 2015-04-06 ENCOUNTER — Ambulatory Visit (HOSPITAL_COMMUNITY)
Admission: RE | Admit: 2015-04-06 | Discharge: 2015-04-06 | Disposition: A | Discharge status: Home / Self Care | Payer: Medicare Other | Source: Ambulatory Visit | Attending: Internal Medicine | Admitting: Internal Medicine

## 2015-04-06 DIAGNOSIS — L02211 Cutaneous abscess of abdominal wall: Secondary | ICD-10-CM | POA: Diagnosis not present

## 2015-04-06 DIAGNOSIS — Z8719 Personal history of other diseases of the digestive system: Secondary | ICD-10-CM | POA: Insufficient documentation

## 2015-04-06 DIAGNOSIS — I252 Old myocardial infarction: Secondary | ICD-10-CM | POA: Diagnosis not present

## 2015-04-06 DIAGNOSIS — K5732 Diverticulitis of large intestine without perforation or abscess without bleeding: Secondary | ICD-10-CM

## 2015-04-06 DIAGNOSIS — K5730 Diverticulosis of large intestine without perforation or abscess without bleeding: Secondary | ICD-10-CM | POA: Insufficient documentation

## 2015-04-06 DIAGNOSIS — D649 Anemia, unspecified: Secondary | ICD-10-CM | POA: Diagnosis not present

## 2015-04-06 DIAGNOSIS — N281 Cyst of kidney, acquired: Secondary | ICD-10-CM | POA: Diagnosis not present

## 2015-04-06 DIAGNOSIS — Z9981 Dependence on supplemental oxygen: Secondary | ICD-10-CM | POA: Diagnosis not present

## 2015-04-06 DIAGNOSIS — J449 Chronic obstructive pulmonary disease, unspecified: Secondary | ICD-10-CM | POA: Diagnosis not present

## 2015-04-06 DIAGNOSIS — Z87891 Personal history of nicotine dependence: Secondary | ICD-10-CM | POA: Diagnosis not present

## 2015-04-06 DIAGNOSIS — R1030 Lower abdominal pain, unspecified: Secondary | ICD-10-CM | POA: Diagnosis not present

## 2015-04-06 DIAGNOSIS — K5720 Diverticulitis of large intestine with perforation and abscess without bleeding: Secondary | ICD-10-CM | POA: Diagnosis not present

## 2015-04-06 DIAGNOSIS — I2510 Atherosclerotic heart disease of native coronary artery without angina pectoris: Secondary | ICD-10-CM | POA: Diagnosis not present

## 2015-04-06 DIAGNOSIS — Z7952 Long term (current) use of systemic steroids: Secondary | ICD-10-CM | POA: Diagnosis not present

## 2015-04-06 DIAGNOSIS — Z8673 Personal history of transient ischemic attack (TIA), and cerebral infarction without residual deficits: Secondary | ICD-10-CM | POA: Diagnosis not present

## 2015-04-06 DIAGNOSIS — E785 Hyperlipidemia, unspecified: Secondary | ICD-10-CM | POA: Diagnosis not present

## 2015-04-06 MED ORDER — IOHEXOL 300 MG/ML  SOLN
100.0000 mL | Freq: Once | INTRAMUSCULAR | Status: AC | PRN
  Administered 2015-04-06: 100 mL via INTRAVENOUS

## 2015-10-31 ENCOUNTER — Encounter: Payer: Self-pay | Admitting: Cardiology

## 2015-10-31 ENCOUNTER — Ambulatory Visit (INDEPENDENT_AMBULATORY_CARE_PROVIDER_SITE_OTHER): Payer: Medicare Other | Admitting: Cardiology

## 2015-10-31 VITALS — BP 112/62 | HR 81 | Ht 63.0 in | Wt 112.0 lb

## 2015-10-31 DIAGNOSIS — I2510 Atherosclerotic heart disease of native coronary artery without angina pectoris: Secondary | ICD-10-CM

## 2015-10-31 DIAGNOSIS — I5032 Chronic diastolic (congestive) heart failure: Secondary | ICD-10-CM

## 2015-10-31 NOTE — Patient Instructions (Signed)
Your physician wants you to follow-up in: 1 Year with Dr. Branch. You will receive a reminder letter in the mail two months in advance. If you don't receive a letter, please call our office to schedule the follow-up appointment.   Your physician recommends that you continue on your current medications as directed. Please refer to the Current Medication list given to you today.  If you need a refill on your cardiac medications before your next appointment, please call your pharmacy.  Thank you for choosing  [facility]!   

## 2015-10-31 NOTE — Progress Notes (Signed)
Patient ID: CHARMELL ARAIZA, female   DOB: 01-24-1941, 75 y.o.   MRN: [id number]     Clinical Summary Ms. Frayre is a 75 y.o.female seen today for follow up of the following medical problems.   1. CAD - recent admission with diverticular abscess and hypotension, developed elevated troponin - cath showed moderate non-obstructive disease  - denies any chest pain since last visit. Stable SOB.  - compliant with meds  2. Chronic diastolic heart failure - denies any recent LE edema or weight gain. COmpliant with diuretics.   3. COPD -compliant with inhalers.  - uses home O2 prn   Past Medical History  Diagnosis Date  . Constipation   . Depression with anxiety   . Diverticulosis   . DVT (deep venous thrombosis)   . COPD (chronic obstructive pulmonary disease)     Emphysema radiographically  . Carotid artery disease     a. duplex [id number]: RICA [id number], LICA 0000000. Followed by [facility].  . Anxiety   . TIA (transient ischemic attack)   . Full dentures   . Pulmonary nodules 08/11/2013    a. CT angio 08/2013: 4-73mm nodules in RUL/RML, f/u recommended 6 months.  . Migraine   . Fibromyalgia   . Stroke   . Protein calorie malnutrition   . First degree AV block   . On home O2   . Myocardial infarction (HCC)      Allergies  Allergen Reactions  . Codeine Other (See Comments)    Makes patient not in right state of mind.   . Oxycodone Itching  . Talwin [Pentazocine] Other (See Comments)    Makes patient not in right state of mind.   . Valium [Diazepam] Other (See Comments)    Just doesn't work.      Current Outpatient Prescriptions  Medication Sig Dispense Refill  . albuterol (PROVENTIL HFA;VENTOLIN HFA) 108 (90 BASE) MCG/ACT inhaler Inhale 2 puffs into the lungs every 4 (four) hours as needed for wheezing or shortness of breath. 1 Inhaler 12  . aspirin 81 MG chewable tablet Chew 1 tablet (81 mg total) by mouth daily. 30 tablet 1  . atorvastatin (LIPITOR) 80 MG tablet Take 1 tablet  (80 mg total) by mouth daily at 6 PM. 30 tablet 3  . budesonide-formoterol (SYMBICORT) 80-4.5 MCG/ACT inhaler Inhale 2 puffs into the lungs 2 (two) times daily. 1 Inhaler 12  . furosemide (LASIX) 40 MG tablet   0  . gabapentin (NEURONTIN) 300 MG capsule Take 3 capsules (900 mg total) by mouth 3 (three) times daily. 270 capsule 6  . HYDROcodone-acetaminophen (NORCO) 10-325 MG per tablet Take 1 tablet by mouth every 6 (six) hours as needed for moderate pain or severe pain. 30 tablet 0  . ipratropium-albuterol (DUONEB) 0.5-2.5 (3) MG/3ML SOLN INHALE CONTENTS OF 1 VIAL VIA NEBULIZER EVERY 6 HOURS  5  . pantoprazole (PROTONIX) 40 MG tablet Take 1 tablet (40 mg total) by mouth daily. 30 tablet 1  . potassium chloride (K-DUR) 10 MEQ tablet TK 2 TS PO D  3  . predniSONE (DELTASONE) 5 MG tablet Take 1 tablet (5 mg total) by mouth daily with breakfast. 30 tablet 1  . saccharomyces boulardii (FLORASTOR) 250 MG capsule Take 250 mg by mouth 2 (two) times daily.     No current facility-administered medications for this visit.     Past Surgical History  Procedure Laterality Date  . Abdominal hysterectomy  1971  . Back surgery      X3  .  Colonoscopy  June 2002    Dr. Ferdinand Lango: severe diverticular disease with haustral hypertrophy, primary and secondary diverticulosis, persistent spasticity, early stenosis  . Colonoscopy with propofol N/A 06/21/2013    Dr. Oneida Alar: sigmoid colon and descending colon with diverticula, small internal hemorrhoids  . Tonsillectomy    . Appendectomy    . Eye surgery      both cataracts-lenses  . Nasal sinus surgery Right 07/11/2013    Procedure: RIGHT ENDOSCOPIC ANTERIOR ETHMOIDECTOMY/RIGHT ENDOSCOPIC MAXILLARY ANTROSTOMY WITH REMOVAL OF TISSUE;  Surgeon: Ascencion Dike, MD;  Location: [facility];  Service: ENT;  Laterality: Right;  . Esophagogastroduodenoscopy N/A 03/06/2014    Procedure: ESOPHAGOGASTRODUODENOSCOPY (EGD);  Surgeon: Danie Binder, MD;  Location: [facility];  Service: Endoscopy;  Laterality: N/A;  9;30  . Savory dilation N/A 03/06/2014    Procedure: SAVORY DILATION;  Surgeon: Danie Binder, MD;  Location: [facility];  Service: Endoscopy;  Laterality: N/A;  . Biopsy  03/06/2014    Procedure: BIOPSY;  Surgeon: Danie Binder, MD;  Location: [facility];  Service: Endoscopy;;  . Other surgical history      scar tissue removal x2  . Left heart catheterization with coronary angiogram N/A 12/21/2014    Procedure: LEFT HEART CATHETERIZATION WITH CORONARY ANGIOGRAM;  Surgeon: Belva Crome, MD;  Location: [facility] CATH LAB;  Service: Cardiovascular;  Laterality: N/A;     Allergies  Allergen Reactions  . Codeine Other (See Comments)    Makes patient not in right state of mind.   . Oxycodone Itching  . Talwin [Pentazocine] Other (See Comments)    Makes patient not in right state of mind.   . Valium [Diazepam] Other (See Comments)    Just doesn't work.       Family History  Problem Relation Age of Onset  . Colon cancer Neg Hx   . Breast cancer Mother   . Heart disease Mother   . Hypertension Mother   . Heart attack Mother   . Other Mother     varicose veins  . Cancer Mother   . Varicose Veins Mother   . Diabetes Brother   . Hypertension Brother   . Heart disease Brother   . Throat cancer Father   . Cancer Father      Social History Ms. Lafleur reports that she quit smoking about 10 months ago. Her smoking use included Cigarettes. She has a 50 pack-year smoking history. She does not have any smokeless tobacco history on file. Ms. Lorah reports that she does not drink alcohol.   Review of Systems CONSTITUTIONAL: No weight loss, fever, chills, weakness or fatigue.  HEENT: Eyes: No visual loss, blurred vision, double vision or yellow sclerae.No hearing loss, sneezing, congestion, runny nose or sore throat.  SKIN: No rash or itching.  CARDIOVASCULAR: per HPI RESPIRATORY: No shortness of breath, cough or sputum.    GASTROINTESTINAL: No anorexia, nausea, vomiting or diarrhea. No abdominal pain or blood.  GENITOURINARY: No burning on urination, no polyuria NEUROLOGICAL: No headache, dizziness, syncope, paralysis, ataxia, numbness or tingling in the extremities. No change in bowel or bladder control.  MUSCULOSKELETAL: No muscle, back pain, joint pain or stiffness.  LYMPHATICS: No enlarged nodes. No history of splenectomy.  PSYCHIATRIC: No history of depression or anxiety.  ENDOCRINOLOGIC: No reports of sweating, cold or heat intolerance. No polyuria or polydipsia.  Marland Kitchen   Physical Examination Filed Vitals:   10/31/15 1305  BP: 112/62  Pulse: 81  Filed Vitals:   10/31/15 1305  Height: 5\' 3"  (1.6 m)  Weight: 112 lb (50.803 kg)    Gen: resting comfortably, no acute distress HEENT: no scleral icterus, pupils equal round and reactive, no palptable cervical adenopathy,  CV: RRR, no m/r/g, no jvd Resp: Clear to auscultation bilaterally GI: abdomen is soft, non-tender, non-distended, normal bowel sounds, no hepatosplenomegaly MSK: extremities are warm, no edema.  Skin: warm, no rash Neuro:  no focal deficits Psych: appropriate affect   Diagnostic Studies 10/2014 echo Study Conclusions  - Left ventricle: The cavity size was normal. Wall thickness was normal. Systolic function was normal. The estimated ejection fraction was in the range of 55% to 60%. Doppler parameters are consistent with abnormal left ventricular relaxation (grade 1 diastolic dysfunction). - Aortic valve: Mildly calcified annulus. Trileaflet; normal thickness leaflets. Valve area (VTI): 2.97 cm^2. Valve area (Vmax): 2.85 cm^2. - Inferior vena cava: The vessel was dilated. The respirophasic diameter changes were blunted (< 50%), consistent with elevated central venous pressure. - Technically difficult study.   12/2014 Cath HEMODYNAMICS: Aortic pressure 114/62 mmHg; LV pressure 1:15/7 mmHg; LVEDP 14  mmHg  ANGIOGRAPHIC DATA: The left main coronary artery is calcified but widely patent.  The left anterior descending artery is widely patent and wraps around the left ventricular apex. The proximal segment has heavy calcification. Within the proximal calcified segment there is ectasia. No obstruction is noted. Tortuosity is noted in the mid and distal LAD beyond the diagonal. No obstruction is seen..  The left circumflex artery is moderately calcified. 4 obtuse marginals arise from the circumflex. No significant obstruction is seen..  The right coronary artery is large vessel giving origin to a tortuous PDA. The proximal RCA contains 40% narrowing. Irregularities are noted in the mid vessel.Marland Kitchen  LEFT VENTRICULOGRAM: Left ventricular angiogram was done in the 30 RAO projection and revealed normal sized LV cavity with an EF of 55%.   IMPRESSIONS: 1. Moderate to heavy three-vessel coronary calcification  2. No significant obstructive coronary disease is noted. There is an eccentric 40% proximal RCA.  3. Overall normal LV function with EF 55% and no regional wall motion abnormality   RECOMMENDATION: Further management per treating team.     Assessment and Plan   1. CAD - moderate nonobstructive disease by cath - no recent symptoms - we will continue current meds  2. Chronic diastolic heart failure - appears euvolemic, no current symptoms - continue current meds  F/u 1 year     Arnoldo Lenis, M.D.

## 2016-04-27 IMAGING — CT CT ABD-PELV W/ CM
2 of 5 series · 15 of 46 positions shown, 17 images · IV contrast (omnipaque)
Comparison: CT abdomen pelvis 12/13/2011

CLINICAL DATA: Follow-up diverticular abscess.  Hypotension.

EXAM:
CT ABDOMEN AND PELVIS WITH CONTRAST
TECHNIQUE: Multidetector CT imaging of the abdomen and pelvis was performed
using the standard protocol following bolus administration of
intravenous contrast.
CONTRAST:  100mL OMNIPAQUE IOHEXOL 300 MG/ML  SOLN

[Series 2: abd_pel_with 5.0 b40f · axial · 0.59mm/px · z∈[-497,-97]mm · 12 of 90 slices shown, 14 images]
[im 5/90  soft-tissue]
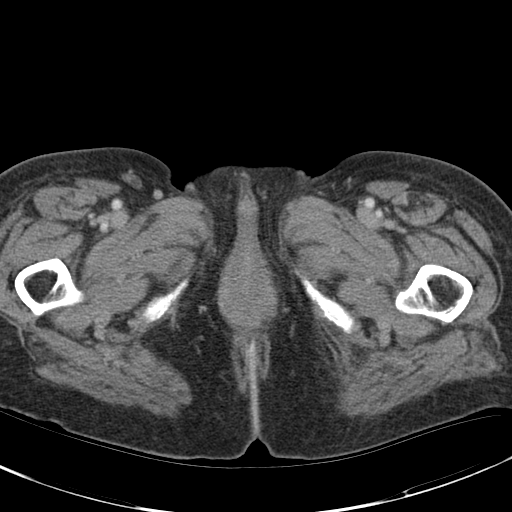
[im 5/90  bone]
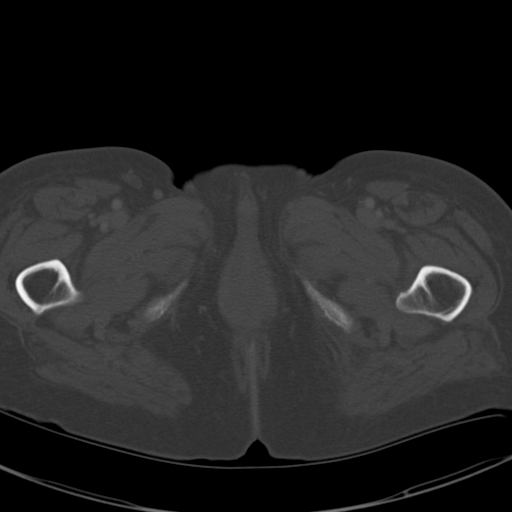
[im 15/90  soft-tissue]
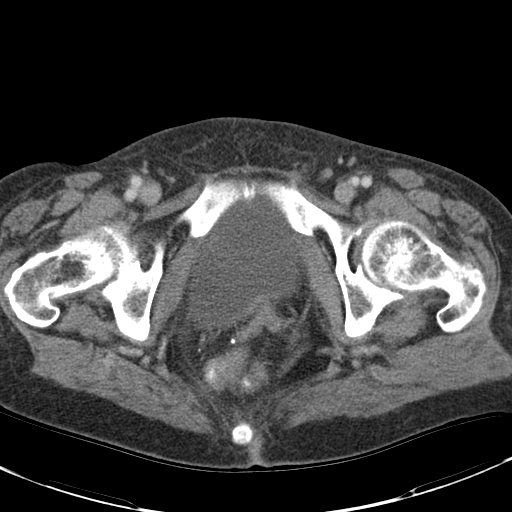
[im 20/90  soft-tissue]
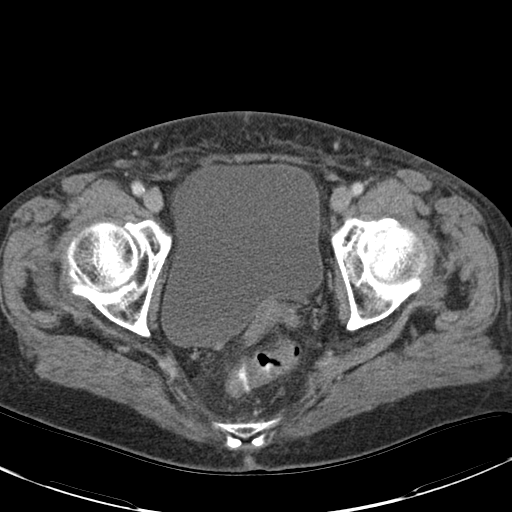
[im 25/90  soft-tissue]
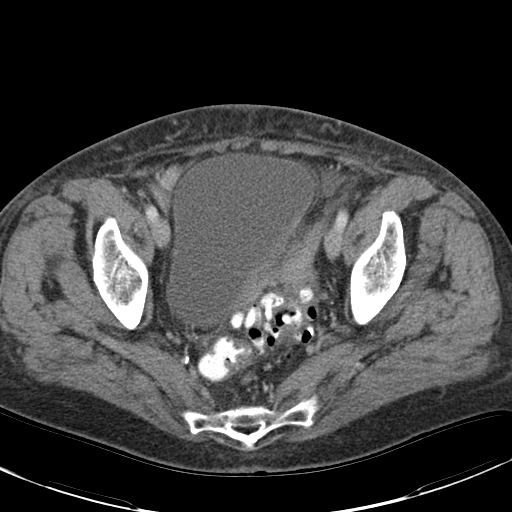
[im 35/90  soft-tissue]
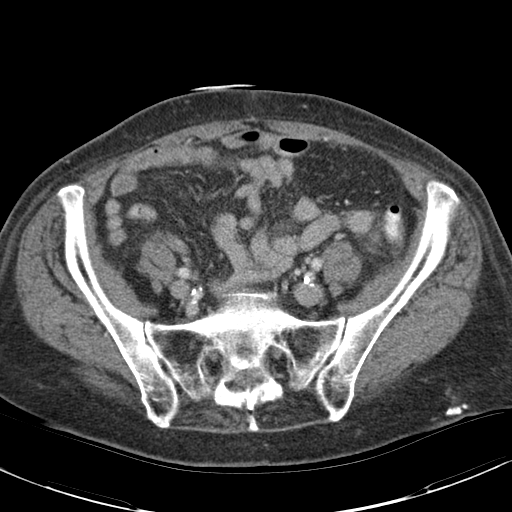
[im 40/90  soft-tissue]
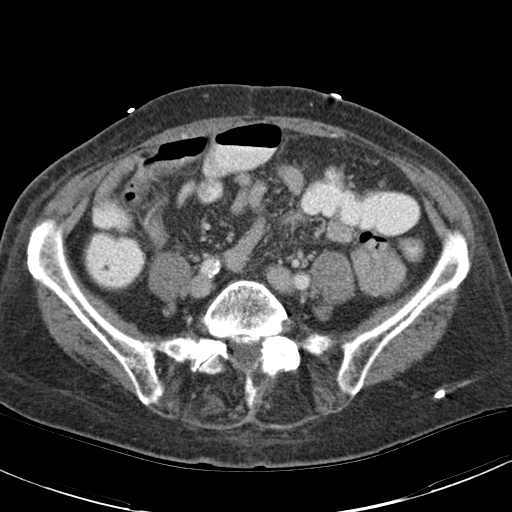
[im 50/90  soft-tissue]
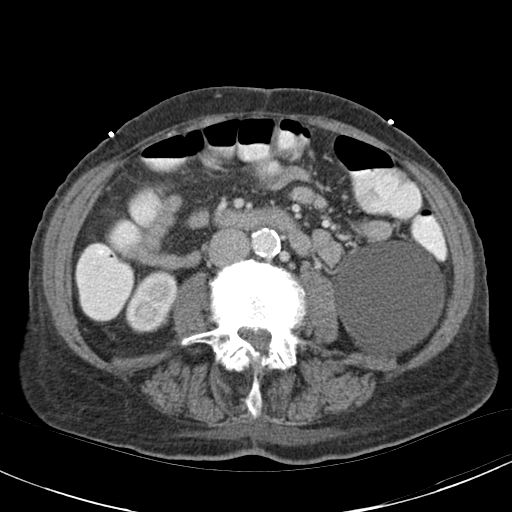
[im 55/90  soft-tissue]
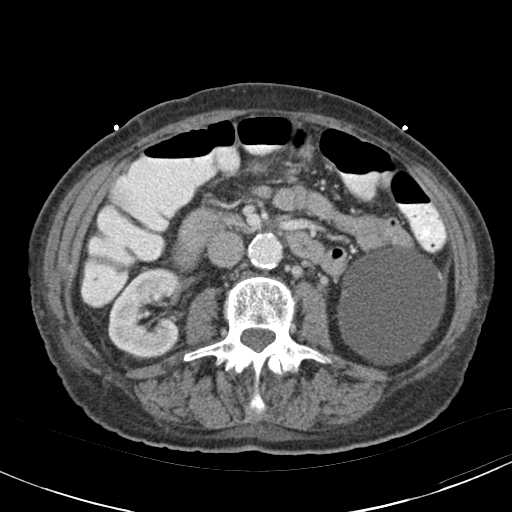
[im 65/90  soft-tissue]
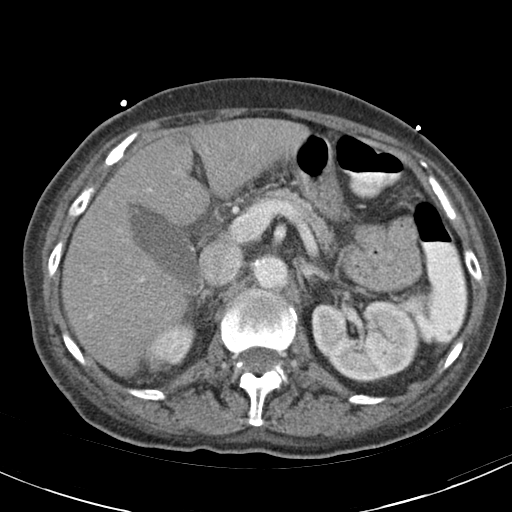
[im 65/90  bone]
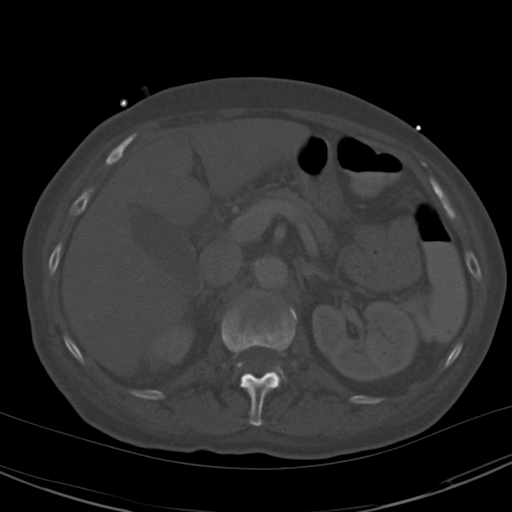
[im 70/90  soft-tissue]
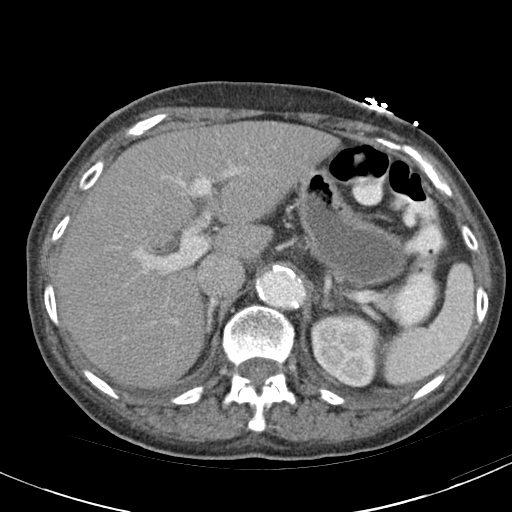
[im 75/90  soft-tissue]
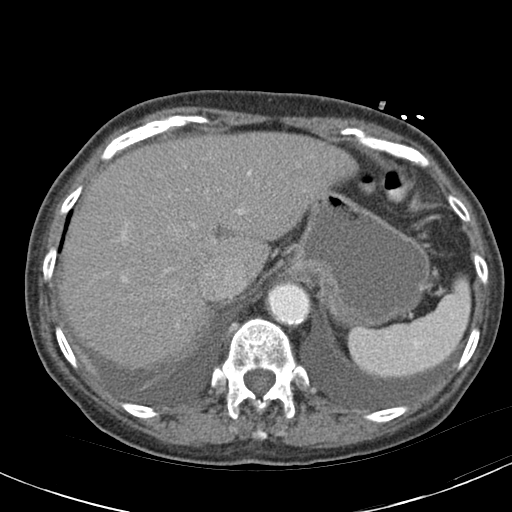
[im 85/90  soft-tissue]
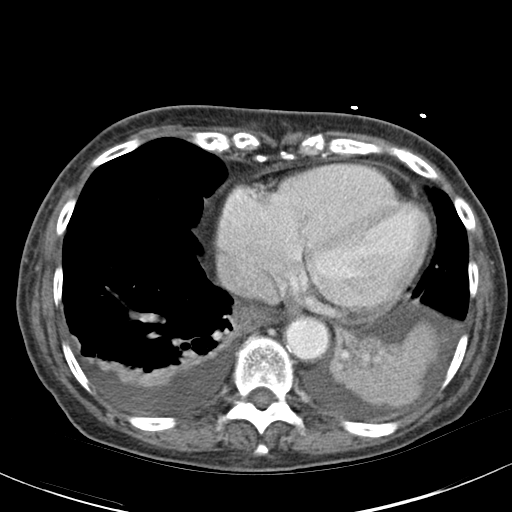

[Series 4: abd_pel_with 3.0 spo cor · coronal · 0.63mm/px · 3 of 77 slices shown]
[im 26/77  soft-tissue]
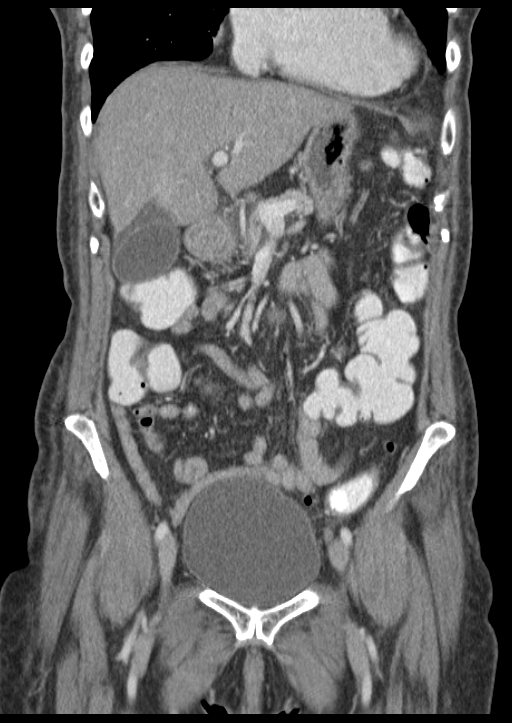
[im 34/77  soft-tissue]
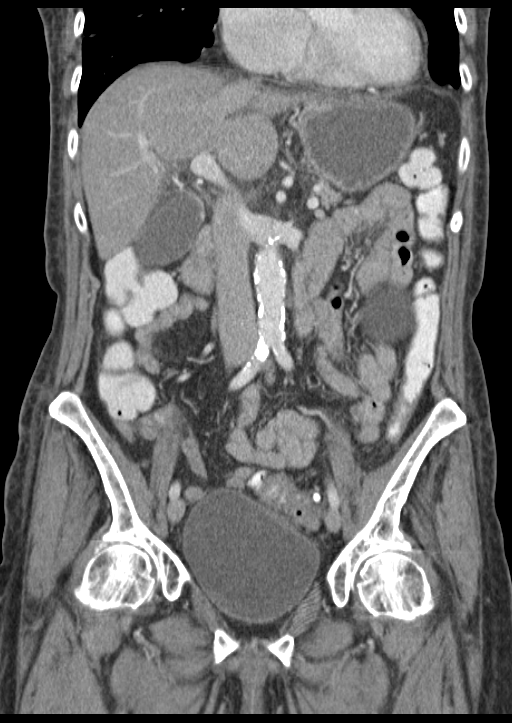
[im 43/77  soft-tissue]
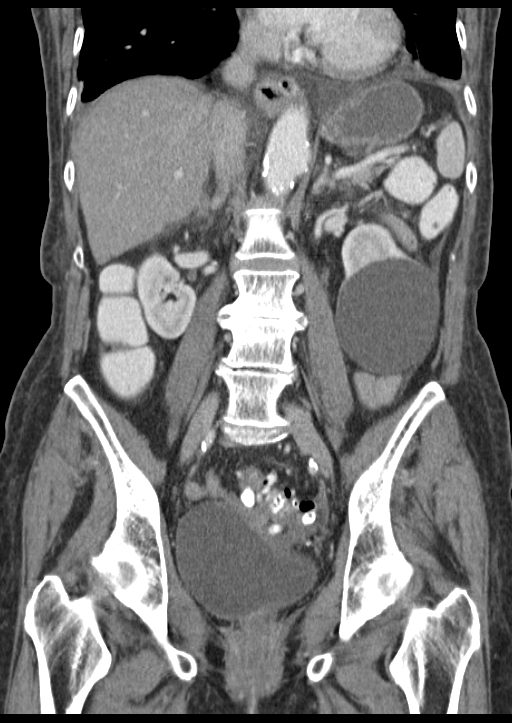

[15 of 46 positions shown; findings below may reference images not displayed]

FINDINGS: Interval resolution of rim enhancing fluid collection posterior to
the sigmoid colon compatible with resolution of abscess. Sigmoid
diverticular change noted in this area with some thickening of the
bowel which also has improved. Findings are consistent with
resolving diverticulitis and abscess. No abscess or free fluid on
today's study. Negative for bowel obstruction.

Small bilateral pleural effusions with bibasilar atelectasis have
developed since the prior study

Focal fatty infiltration of the left lobe of the liver adjacent to
the falciform ligament unchanged. Pericholecystic fluid has
developed since the prior study however the gallbladder wall is not
thickened and no gallstones identified. Correlate with right upper
quadrant pain and ultrasound if clinically indicated. Spleen is
normal. Pancreas is atrophic without edema or mass

Negative for renal obstruction or mass. Multiple renal cysts.
Largest cyst left lower pole measuring 7 cm

Atherosclerotic aorta without aneurysm. Prior hysterectomy. No
adenopathy.

Moderate disc degeneration and spurring in the lower lumbar spine.
Negative for fracture
IMPRESSION: Improvement in diverticulitis. Resolution of diverticular abscess in
the sigmoid region.

Interval development of pericholecystic fluid. No gallbladder
thickening or gallstones. Correlate with pain in this area and
possible ultrasound if indicated

Interval development of bilateral pleural effusions and bibasilar
atelectasis.

## 2016-04-27 IMAGING — CT CT ANGIO CHEST
1 of 6 series · 5 of 36 positions shown · IV contrast (Omnipaque 300)
Comparison: None.

CLINICAL DATA: Shortness of breath. Recent surgery for
diverticulitis. Chest pain started last night.

EXAM:
CT ANGIOGRAPHY CHEST WITH CONTRAST
TECHNIQUE: Multidetector CT imaging of the chest was performed using the
standard protocol during bolus administration of intravenous
contrast. Multiplanar CT image reconstructions and MIPs were
obtained to evaluate the vascular anatomy.
CONTRAST:  80mL OMNIPAQUE IOHEXOL 350 MG/ML SOLN

[Series 5: pe 3.0 b40f · axial · 0.56mm/px · z∈[-265,-73]mm · 5 of 96 slices shown]
[im 16/96  lung]
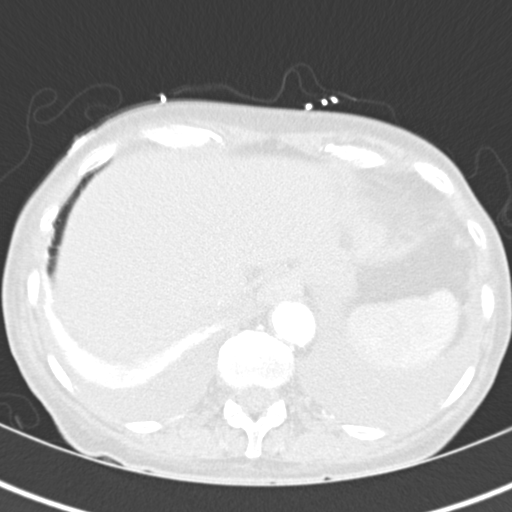
[im 32/96  mediastinal]
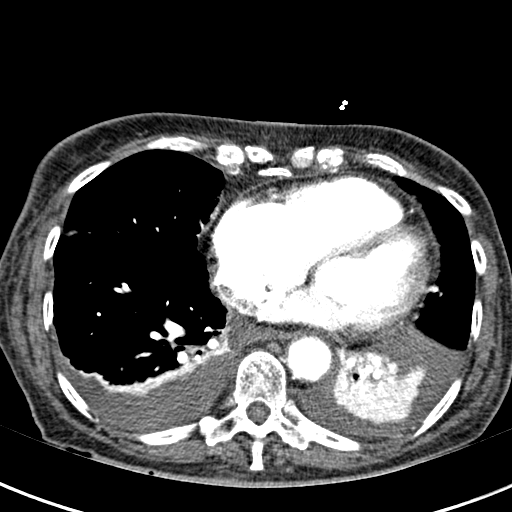
[im 48/96  lung]
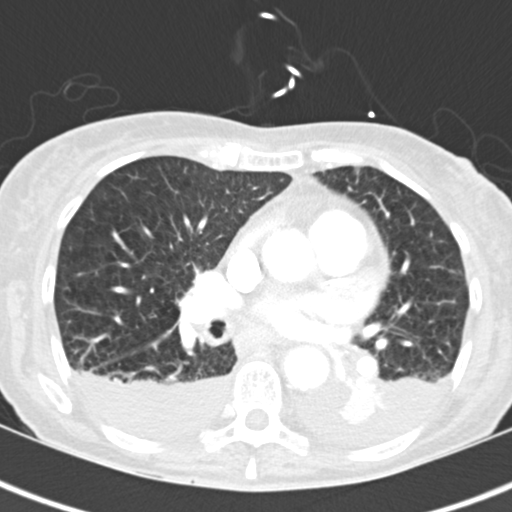
[im 64/96  mediastinal]
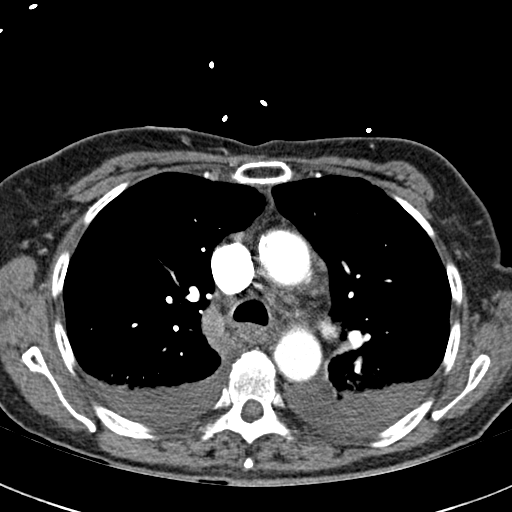
[im 80/96  lung]
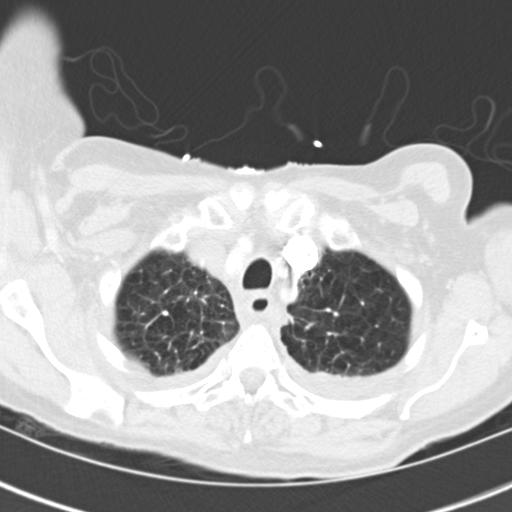

[5 of 36 positions shown; findings below may reference images not displayed]

FINDINGS: There is adequate opacification of the pulmonary arteries. There is
no pulmonary embolus. The main pulmonary artery, right main
pulmonary artery and left main pulmonary arteries are normal in
size. The heart size is mildly enlarged. There is no pericardial
effusion. There is coronary artery atherosclerosis in the LAD,
circumflex and RCA.

There are bilateral small pleural effusions. There is bilateral
compressive atelectasis, left greater than right. There is bilateral
centrilobular emphysema. There is no pneumothorax.

There is no axillary, hilar, or mediastinal adenopathy.

There is no lytic or blastic osseous lesion.

The visualized portions of the upper abdomen are unremarkable.

Review of the MIP images confirms the above findings.
IMPRESSION: 1. No evidence pulmonary embolus.
2. Bilateral small pleural effusions and compressive atelectasis.
3. Bilateral centrilobular emphysema.

## 2016-04-27 IMAGING — CR DG CHEST 1V PORT
1 series · 2 of 2 positions shown · non-contrast
Comparison: 12/18/2014

CLINICAL DATA: Cough, shortness of breath.

EXAM:
PORTABLE CHEST - 1 VIEW

[Series 1: ap portable · 0.17mm/px · 2 of 2 slices shown]
[im 1/2]
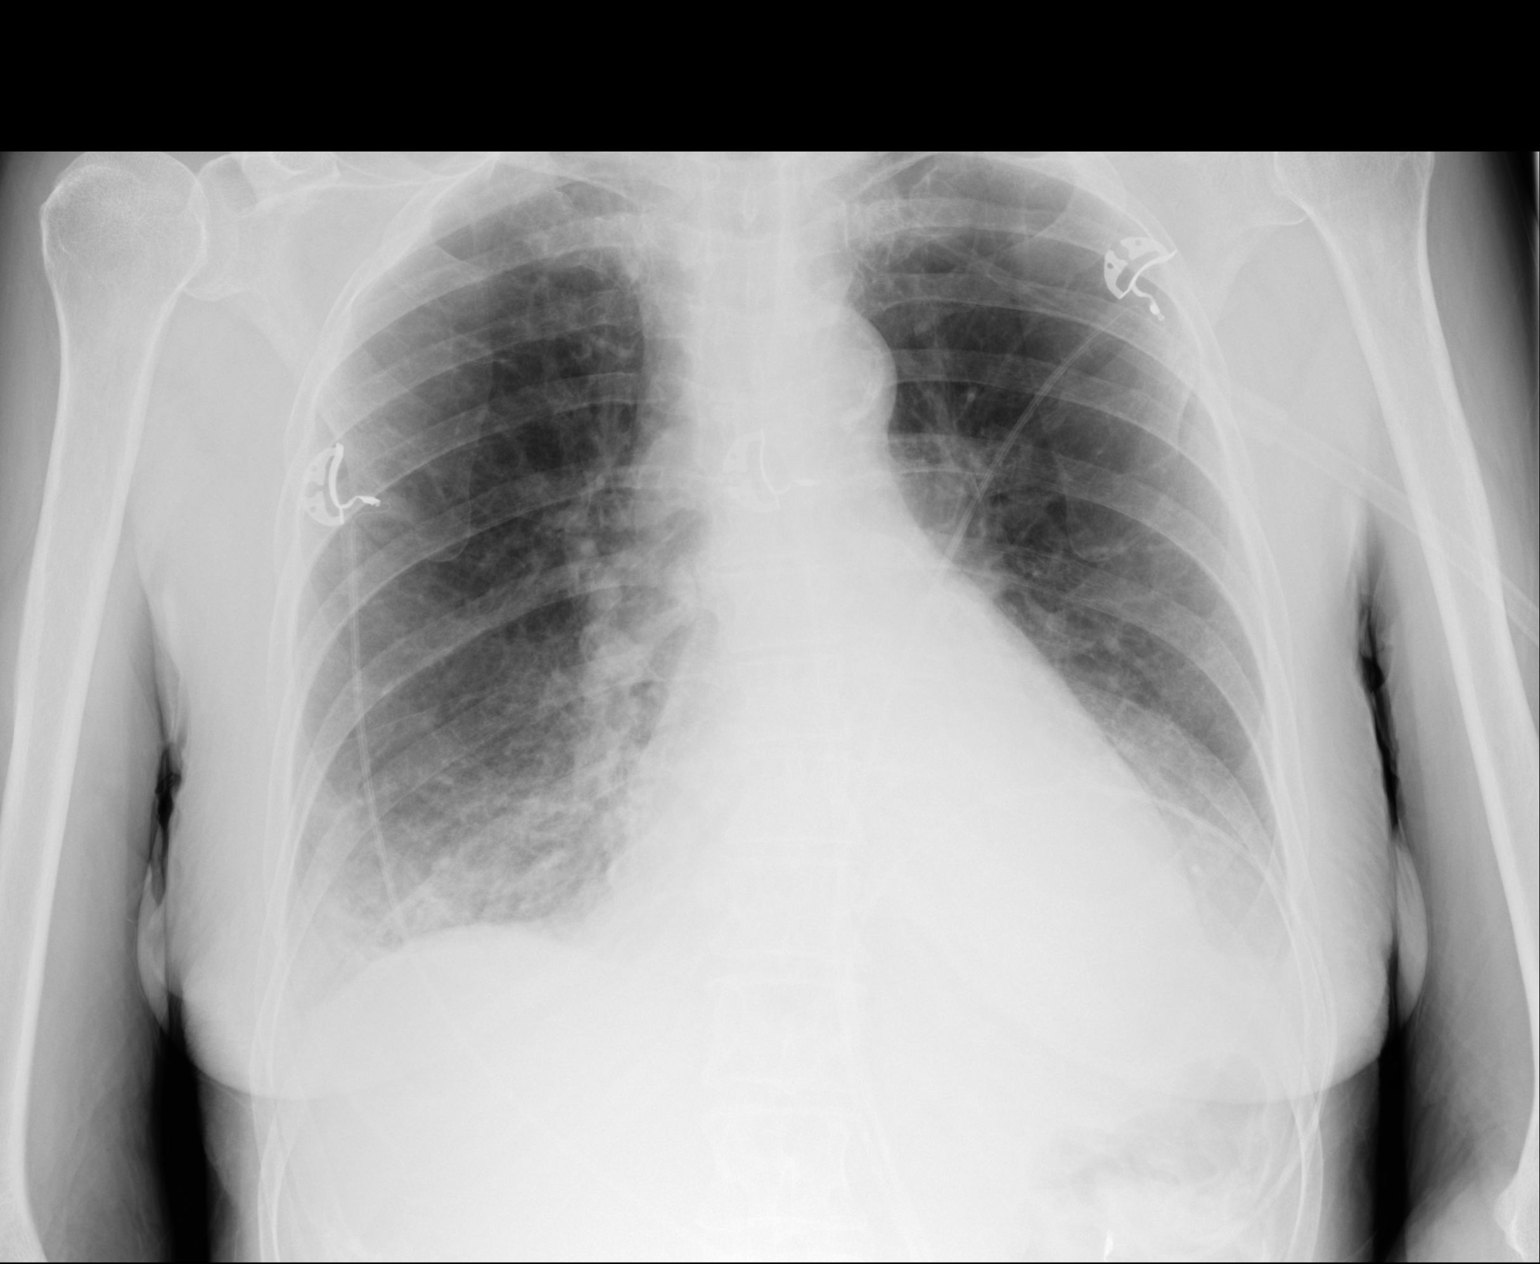
[im 2/2]
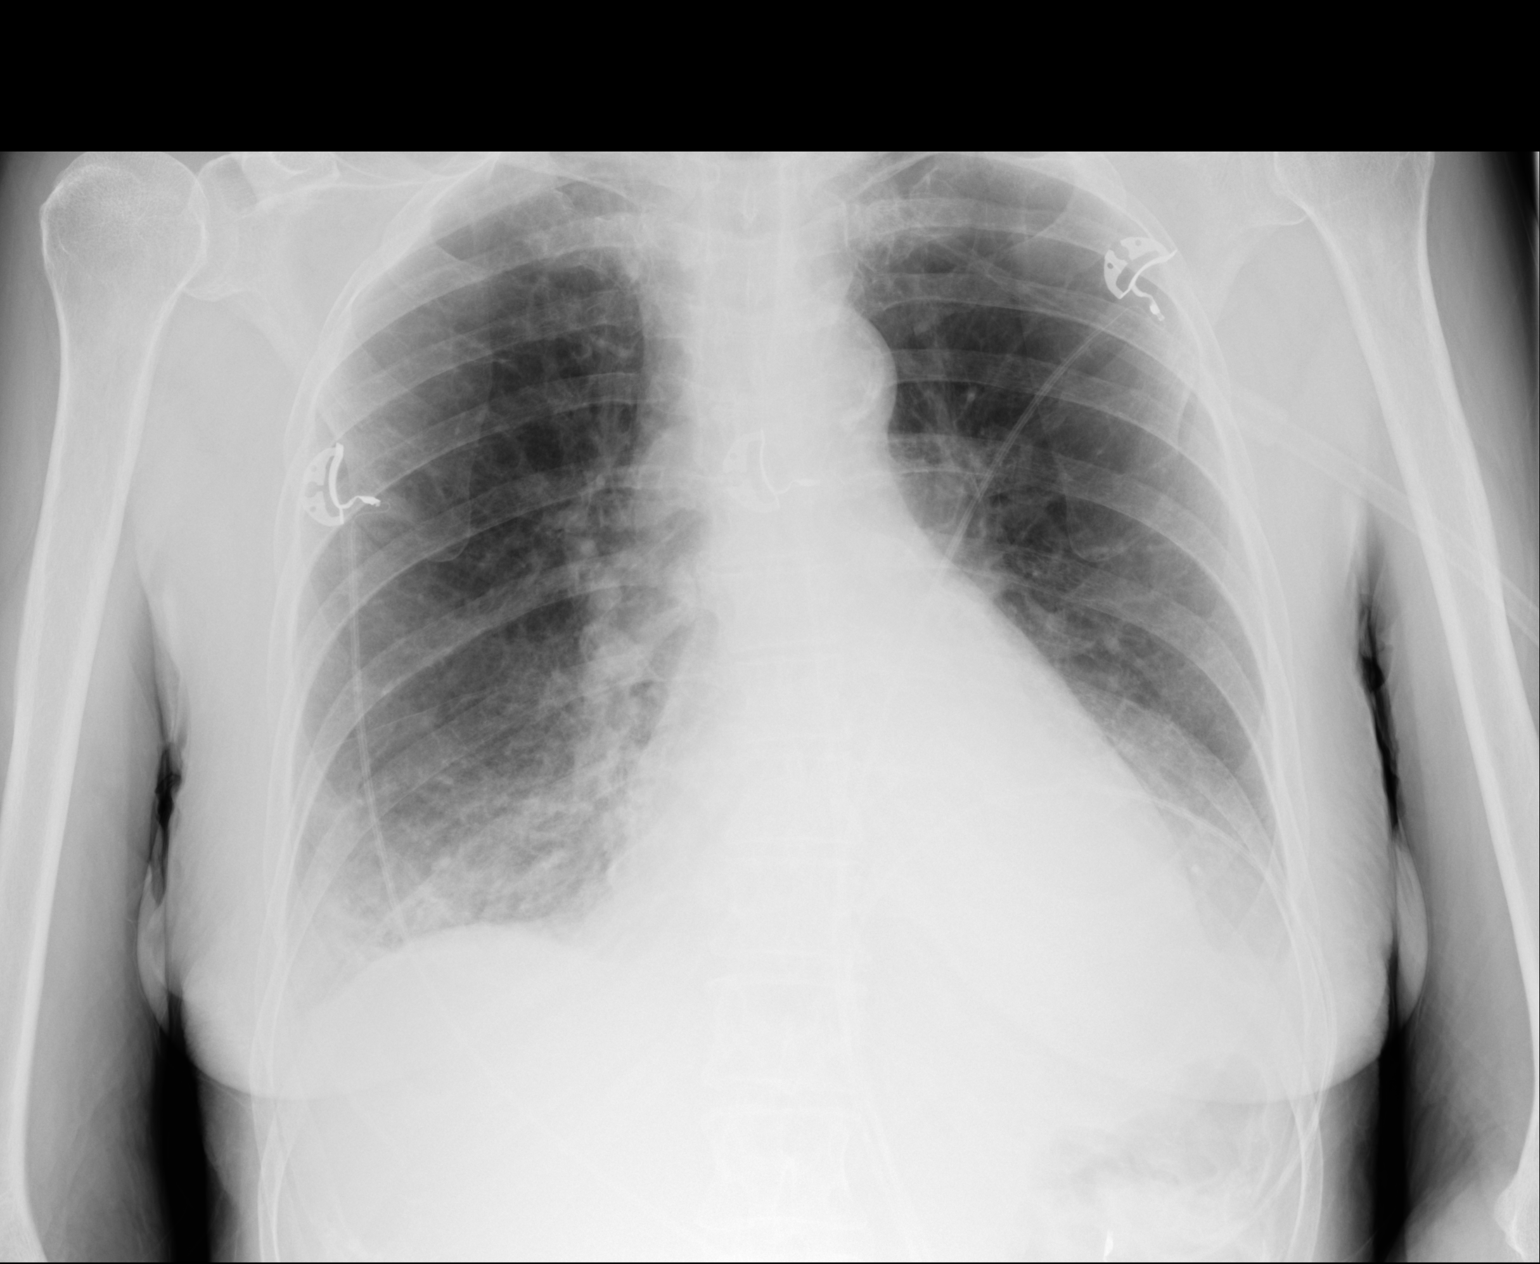

[2 of 2 positions shown; findings below may reference images not displayed]

FINDINGS: Interstitial coarsening. Increased bibasilar opacities. There may be
small layering pleural effusions. Aortic atherosclerosis. Enlarged
cardiac silhouette. Central vascular congestion. No pneumothorax.
Osteopenia.
IMPRESSION: Small pleural effusions and increased bibasilar opacities which may
reflect atelectasis, aspiration, or pneumonia.

Background COPD.

Recommend radiograph follow-up in 3-4 weeks to document resolution.

## 2016-05-01 IMAGING — CR DG CHEST 1V PORT
1 series · 1 of 1 positions shown · non-contrast
Comparison: 12/19/2014

CLINICAL DATA: Hypoxia

EXAM:
PORTABLE CHEST - 1 VIEW

[AP]
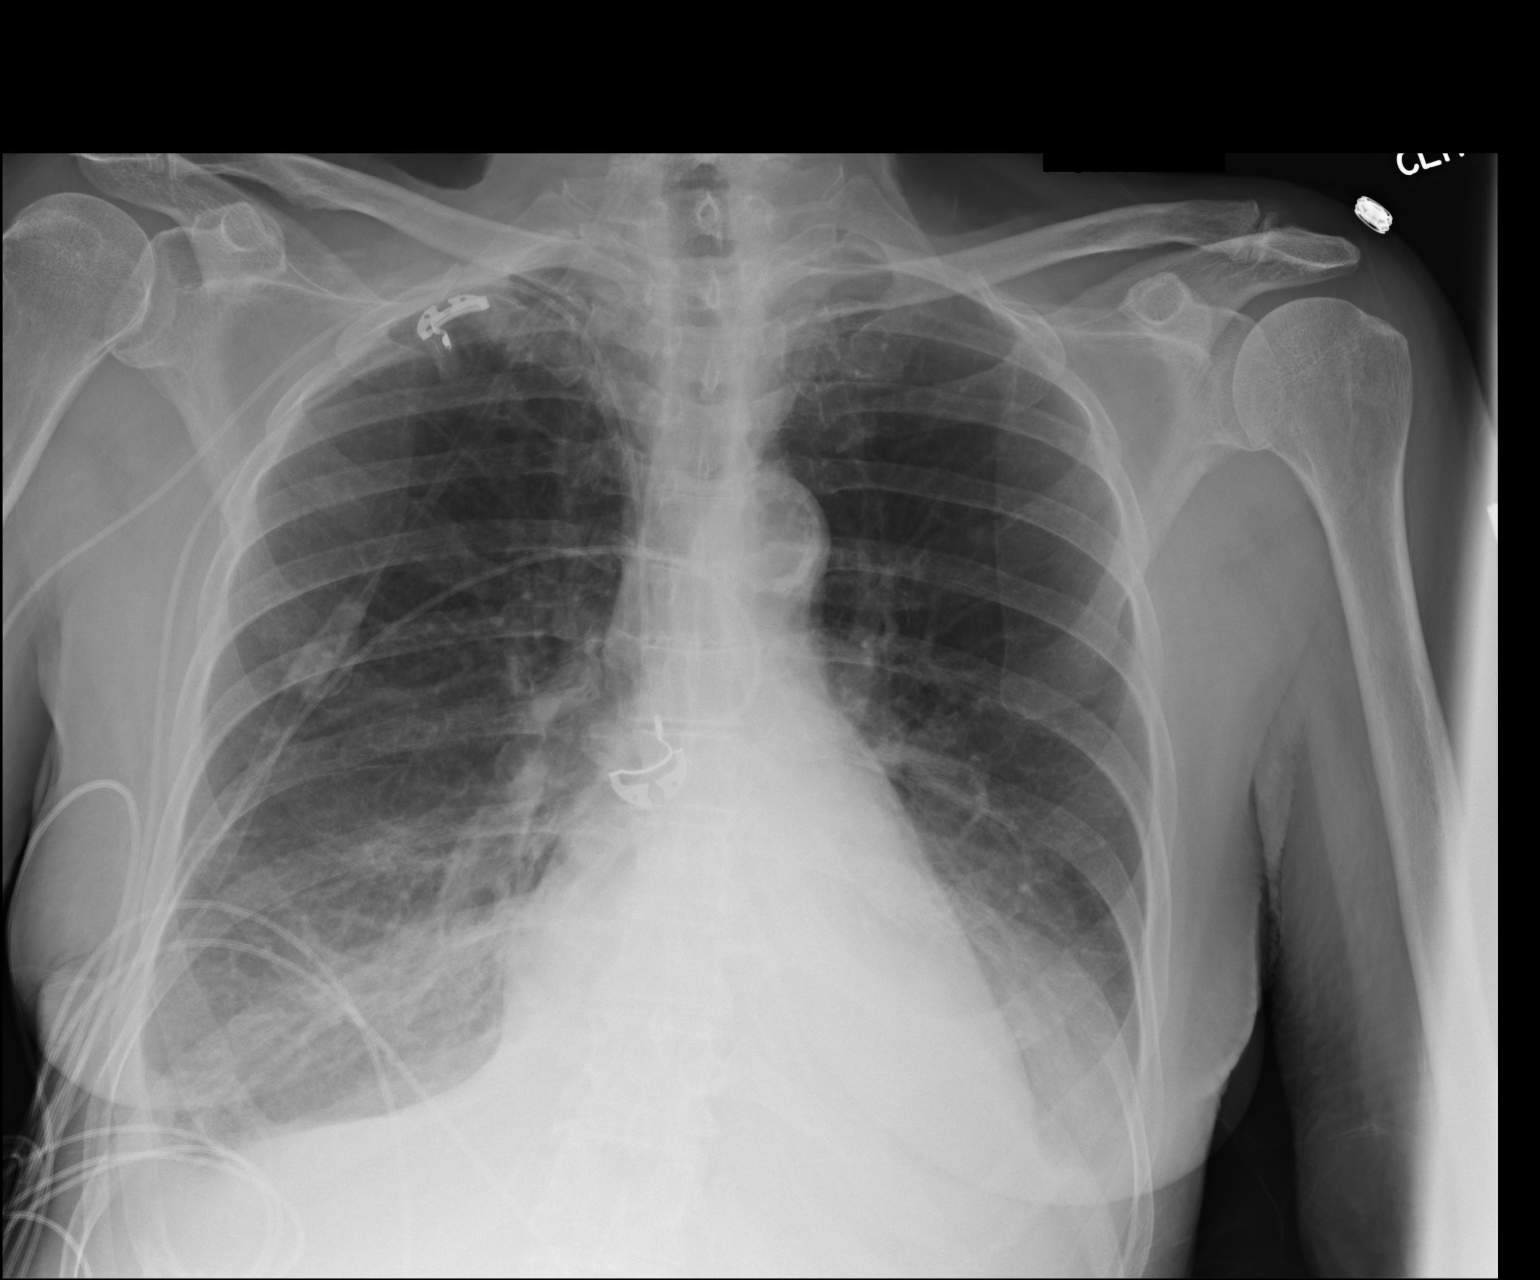

[1 of 1 positions shown; findings below may reference images not displayed]

FINDINGS: Bilateral pleural effusions partly obscures a hemidiaphragms. Lungs
are hyperexpanded. No lung consolidation or edema.

Cardiac silhouette is borderline enlarged. No mediastinal or hilar
masses.

Right PICC tip projects in the lower superior vena cava.
IMPRESSION: 1. Persistent bilateral pleural effusions.
2. No convincing pneumonia. No pulmonary edema. Stable changes of
emphysema.
3. Right PICC is well positioned, new from the prior chest
radiograph.

## 2016-09-24 ENCOUNTER — Encounter: Payer: Self-pay | Admitting: Cardiology

## 2016-11-13 ENCOUNTER — Ambulatory Visit: Payer: Medicare Other | Admitting: Cardiology

## 2016-11-14 ENCOUNTER — Ambulatory Visit (INDEPENDENT_AMBULATORY_CARE_PROVIDER_SITE_OTHER): Payer: Medicare Other | Admitting: Cardiology

## 2016-11-14 ENCOUNTER — Encounter: Payer: Self-pay | Admitting: Cardiology

## 2016-11-14 VITALS — BP 118/60 | HR 87 | Ht 63.0 in | Wt 120.0 lb

## 2016-11-14 DIAGNOSIS — Z01818 Encounter for other preprocedural examination: Secondary | ICD-10-CM | POA: Diagnosis not present

## 2016-11-14 DIAGNOSIS — I2510 Atherosclerotic heart disease of native coronary artery without angina pectoris: Secondary | ICD-10-CM | POA: Diagnosis not present

## 2016-11-14 DIAGNOSIS — I6523 Occlusion and stenosis of bilateral carotid arteries: Secondary | ICD-10-CM

## 2016-11-14 DIAGNOSIS — I5032 Chronic diastolic (congestive) heart failure: Secondary | ICD-10-CM | POA: Diagnosis not present

## 2016-11-14 NOTE — Progress Notes (Signed)
Clinical Summary Ms. Payes is a 76 y.o.female seen today for follow up of the following medical problems.   1. CAD - recent admission with diverticular abscess and hypotension, developed elevated troponin - cath showed moderate non-obstructive disease - occasional chest pain at times, tends to come on with anxiety.No specific exertional symptoms.     2. Chronic diastolic heart failure - she remains mpliant with diuretics. No recent SOB, no LE edema  3. COPD -compliant with inhalers.  - uses home O2   4. Preoperative evaluation  - upcoming abdominal surgery due to diverticulitis. - exertion is limited primarily by COPD. Sedentary lifestyle.   5. Carotid stenosis - followed by vascular - will need repeat US at next f/u   Past Medical History:  Diagnosis Date  . Anxiety   . Carotid artery disease ([place])    a. duplex 04/3418: RICA <62%, LICA 22-97%. Followed by [facility].  . Constipation   . COPD (chronic obstructive pulmonary disease) ([facility])    Emphysema radiographically  . Depression with anxiety   . Diverticulosis   . DVT (deep venous thrombosis) ([place])   . Fibromyalgia   . First degree AV block   . Full dentures   . Migraine   . Myocardial infarction   . On home O2   . Protein calorie malnutrition ([place])   . Pulmonary nodules 08/11/2013   a. CT angio 08/2013: 4-47mm nodules in RUL/RML, f/u recommended 6 months.  . Stroke ([place])   . TIA (transient ischemic attack)      Allergies  Allergen Reactions  . Codeine Other (See Comments)    Makes patient not in right state of mind.   . Oxycodone Itching  . Talwin [Pentazocine] Other (See Comments)    Makes patient not in right state of mind.   . Valium [Diazepam] Other (See Comments)    Just doesn't work.      Current Outpatient Prescriptions  Medication Sig Dispense Refill  . albuterol (PROVENTIL HFA;VENTOLIN HFA) 108 (90 BASE) MCG/ACT inhaler Inhale 2 puffs into the lungs every 4 (four) hours as needed for  wheezing or shortness of breath. 1 Inhaler 12  . aspirin 81 MG chewable tablet Chew 1 tablet (81 mg total) by mouth daily. 30 tablet 1  . atorvastatin (LIPITOR) 80 MG tablet Take 1 tablet (80 mg total) by mouth daily at 6 PM. 30 tablet 3  . budesonide-formoterol (SYMBICORT) 80-4.5 MCG/ACT inhaler Inhale 2 puffs into the lungs 2 (two) times daily. 1 Inhaler 12  . furosemide (LASIX) 40 MG tablet   0  . gabapentin (NEURONTIN) 300 MG capsule Take 3 capsules (900 mg total) by mouth 3 (three) times daily. 270 capsule 6  . HYDROcodone-acetaminophen (NORCO) 10-325 MG per tablet Take 1 tablet by mouth every 6 (six) hours as needed for moderate pain or severe pain. 30 tablet 0  . ipratropium-albuterol (DUONEB) 0.5-2.5 (3) MG/3ML SOLN INHALE CONTENTS OF 1 VIAL VIA NEBULIZER EVERY 6 HOURS  5  . pantoprazole (PROTONIX) 40 MG tablet Take 1 tablet (40 mg total) by mouth daily. 30 tablet 1  . potassium chloride (K-DUR) 10 MEQ tablet TK 2 TS PO D  3  . predniSONE (DELTASONE) 5 MG tablet Take 1 tablet (5 mg total) by mouth daily with breakfast. 30 tablet 1  . saccharomyces boulardii (FLORASTOR) 250 MG capsule Take 250 mg by mouth 2 (two) times daily.     No current facility-administered medications for this visit.  Past Surgical History:  Procedure Laterality Date  . ABDOMINAL HYSTERECTOMY  1971  . APPENDECTOMY    . BACK SURGERY     X3  . BIOPSY  03/06/2014   Procedure: BIOPSY;  Surgeon: Danie Binder, MD;  [facility];  Service: Endoscopy;;  . COLONOSCOPY  June 2002   Dr. Ferdinand Lango: severe diverticular disease with haustral hypertrophy, primary and secondary diverticulosis, persistent spasticity, early stenosis  . COLONOSCOPY WITH PROPOFOL N/A 06/21/2013   Dr. Oneida Alar: sigmoid colon and descending colon with diverticula, small internal hemorrhoids  . ESOPHAGOGASTRODUODENOSCOPY N/A 03/06/2014   Procedure: ESOPHAGOGASTRODUODENOSCOPY (EGD);  Surgeon: Danie Binder, MD;  [facility];   Service: Endoscopy;  Laterality: N/A;  9;30  . EYE SURGERY     both cataracts-lenses  . LEFT HEART CATHETERIZATION WITH CORONARY ANGIOGRAM N/A 12/21/2014   Procedure: LEFT HEART CATHETERIZATION WITH CORONARY ANGIOGRAM;  Surgeon: Belva Crome, MD;  Location: [facility] CATH LAB;  Service: Cardiovascular;  Laterality: N/A;  . NASAL SINUS SURGERY Right 07/11/2013   Procedure: RIGHT ENDOSCOPIC ANTERIOR ETHMOIDECTOMY/RIGHT ENDOSCOPIC MAXILLARY ANTROSTOMY WITH REMOVAL OF TISSUE;  Surgeon: Ascencion Dike, MD;  Location: [place];  Service: ENT;  Laterality: Right;  . OTHER SURGICAL HISTORY     scar tissue removal x2  . SAVORY DILATION N/A 03/06/2014   Procedure: SAVORY DILATION;  Surgeon: Danie Binder, MD;  [facility];  Service: Endoscopy;  Laterality: N/A;  . TONSILLECTOMY       Allergies  Allergen Reactions  . Codeine Other (See Comments)    Makes patient not in right state of mind.   . Oxycodone Itching  . Talwin [Pentazocine] Other (See Comments)    Makes patient not in right state of mind.   . Valium [Diazepam] Other (See Comments)    Just doesn't work.       Family History  Problem Relation Age of Onset  . Colon cancer Neg Hx   . Breast cancer Mother   . Heart disease Mother   . Hypertension Mother   . Heart attack Mother   . Other Mother     varicose veins  . Cancer Mother   . Varicose Veins Mother   . Diabetes Brother   . Hypertension Brother   . Heart disease Brother   . Throat cancer Father   . Cancer Father      Social History Ms. Dunphy reports that she quit smoking about 23 months ago. Her smoking use included Cigarettes. She has a 50.00 pack-year smoking history. She does not have any smokeless tobacco history on file. Ms. Alcaide reports that she does not drink alcohol.   Review of Systems CONSTITUTIONAL: No weight loss, fever, chills, weakness or fatigue.  HEENT: Eyes: No visual loss, blurred vision, double vision or yellow sclerae.No  hearing loss, sneezing, congestion, runny nose or sore throat.  SKIN: No rash or itching.  CARDIOVASCULAR: per hpi RESPIRATORY: No shortness of breath, cough or sputum.  GASTROINTESTINAL: No anorexia, nausea, vomiting or diarrhea. No abdominal pain or blood.  GENITOURINARY: No burning on urination, no polyuria NEUROLOGICAL: No headache, dizziness, syncope, paralysis, ataxia, numbness or tingling in the extremities. No change in bowel or bladder control.  MUSCULOSKELETAL: No muscle, back pain, joint pain or stiffness.  LYMPHATICS: No enlarged nodes. No history of splenectomy.  PSYCHIATRIC: No history of depression or anxiety.  ENDOCRINOLOGIC: No reports of sweating, cold or heat intolerance. No polyuria or polydipsia.  Marland Kitchen   Physical  Examination Vitals:   11/14/16 1030  BP: 118/60  Pulse: 87   Vitals:   11/14/16 1030  Weight: 120 lb (54.4 kg)  Height: 5\' 3"  (1.6 m)    Gen: resting comfortably, no acute distress HEENT: no scleral icterus, pupils equal round and reactive, no palptable cervical adenopathy,  CV: RRR, no m/r/g no jvd Resp: Clear to auscultation bilaterally GI: abdomen is soft, non-tender, non-distended, normal bowel sounds, no hepatosplenomegaly MSK: extremities are warm, no edema.  Skin: warm, no rash Neuro:  no focal deficits Psych: appropriate affect   Diagnostic Studies 10/2014 echo Study Conclusions  - Left ventricle: The cavity size was normal. Wall thickness was normal. Systolic function was normal. The estimated ejection fraction was in the range of 55% to 60%. Doppler parameters are consistent with abnormal left ventricular relaxation (grade 1 diastolic dysfunction). - Aortic valve: Mildly calcified annulus. Trileaflet; normal thickness leaflets. Valve area (VTI): 2.97 cm^2. Valve area (Vmax): 2.85 cm^2. - Inferior vena cava: The vessel was dilated. The respirophasic diameter changes were blunted (< 50%), consistent with  elevated central venous pressure. - Technically difficult study.   12/2014 Cath HEMODYNAMICS: Aortic pressure 114/62 mmHg; LV pressure 1:15/7 mmHg; LVEDP 14 mmHg  ANGIOGRAPHIC DATA: The left main coronary artery is calcified but widely patent.  The left anterior descending artery is widely patent and wraps around the left ventricular apex. The proximal segment has heavy calcification. Within the proximal calcified segment there is ectasia. No obstruction is noted. Tortuosity is noted in the mid and distal LAD beyond the diagonal. No obstruction is seen..  The left circumflex artery is moderately calcified. 4 obtuse marginals arise from the circumflex. No significant obstruction is seen..  The right coronary artery is large vessel giving origin to a tortuous PDA. The proximal RCA contains 40% narrowing. Irregularities are noted in the mid vessel.Marland Kitchen  LEFT VENTRICULOGRAM: Left ventricular angiogram was done in the 30 RAO projection and revealed normal sized LV cavity with an EF of 55%.   IMPRESSIONS: 1. Moderate to heavy three-vessel coronary calcification  2. No significant obstructive coronary disease is noted. There is an eccentric 40% proximal RCA.  3. Overall normal LV function with EF 55% and no regional wall motion abnormality   RECOMMENDATION: Further management per treating team.    10/2014 Carotid US IMPRESSION: 1. Bilateral proximal ICA plaque resulting in 50-69 % diameter stenosis on the left, less than 50% diameter stenosis on the right. The exam does not exclude plaque ulceration or embolization. Continued surveillance recommended.   Assessment and Plan  1. CAD - moderate nonobstructive disease by cath. EKG in clinic today shows SR, no ischemic changes - arecent atypical symptoms - continue to monitor.   2. Chronic diastolic heart failure - appears euvolemic - she will continue current meds  3. Preoperative evaluatoin - recommend  proceeding from cardiac standpoint. Recent echo and cath without significant cardiac findings  4. Carotid stenosis - will need repeat US at next visit  F/u 6 months      Arnoldo Lenis, M.D.

## 2016-11-14 NOTE — Patient Instructions (Signed)

## 2017-05-20 ENCOUNTER — Ambulatory Visit (INDEPENDENT_AMBULATORY_CARE_PROVIDER_SITE_OTHER): Payer: Medicare Other | Admitting: Cardiology

## 2017-05-20 ENCOUNTER — Encounter: Payer: Self-pay | Admitting: Cardiology

## 2017-05-20 VITALS — BP 100/64 | HR 87 | Ht 63.0 in | Wt 120.0 lb

## 2017-05-20 DIAGNOSIS — R079 Chest pain, unspecified: Secondary | ICD-10-CM | POA: Diagnosis not present

## 2017-05-20 DIAGNOSIS — I6523 Occlusion and stenosis of bilateral carotid arteries: Secondary | ICD-10-CM

## 2017-05-20 DIAGNOSIS — I2510 Atherosclerotic heart disease of native coronary artery without angina pectoris: Secondary | ICD-10-CM | POA: Diagnosis not present

## 2017-05-20 NOTE — Patient Instructions (Signed)
Medication Instructions:  Your physician recommends that you continue on your current medications as directed. Please refer to the Current Medication list given to you today.   Labwork: none  Testing/Procedures: Your physician has requested that you have a carotid duplex. This test is an ultrasound of the carotid arteries in your neck. It looks at blood flow through these arteries that supply the brain with blood. Allow one hour for this exam. There are no restrictions or special instructions.  Your physician has requested that you have a dobutamine myoview. For furth information please visit [email or web address]. Please follow instruction sheet, as given.    Follow-Up: Your physician recommends that you schedule a follow-up appointment in: to be determined    Any Other Special Instructions Will Be Listed Below (If Applicable).     If you need a refill on your cardiac medications before your next appointment, please call your pharmacy.

## 2017-05-20 NOTE — Progress Notes (Signed)
Clinical Summary Ms. Philipps is a 76 y.o.female seen today for follow up of the following medical problems.   1. CAD - recent admission with diverticular abscess and hypotension, developed elevated troponin - 12/2014 cath showed moderate non-obstructive disease - occasional chest pain at times, tends to come on with anxiety.No specific exertional symptoms.   - still with some chest pain. - pressure like pain throughout chest, 8/10 in severity. Occurred at rest. Felt nauseous, some increased SOB. Pain for about 1 hour. +headache. Some positional component. Similar episode the following night.     2. Chronic diastolic heart failure - she remains mpliant with diuretics. No recent SOB, no LE edema  - she denies any recent LE edema. No orthopnea or PND.   3. COPD -compliant with inhalers.  - uses home O2  - follwed by Dr Ma Rings by [facility].    4. Carotid stenosis - followed by vascular - Past Medical History:  Diagnosis Date  . Anxiety   . Carotid artery disease ([place])    a. duplex 03/6194: RICA <09%, LICA 32-67%. Followed by [facility].  . Constipation   . COPD (chronic obstructive pulmonary disease) ([facility])    Emphysema radiographically  . Depression with anxiety   . Diverticulosis   . DVT (deep venous thrombosis) ([place])   . Fibromyalgia   . First degree AV block   . Full dentures   . Migraine   . Myocardial infarction   . On home O2   . Protein calorie malnutrition ([place])   . Pulmonary nodules 08/11/2013   a. CT angio 08/2013: 4-61mm nodules in RUL/RML, f/u recommended 6 months.  . Stroke ([place])   . TIA (transient ischemic attack)      Allergies  Allergen Reactions  . Codeine Other (See Comments)    Makes patient not in right state of mind.   . Oxycodone Itching  . Talwin [Pentazocine] Other (See Comments)    Makes patient not in right state of mind.   . Valium [Diazepam] Other (See Comments)    Just doesn't work.      Current Outpatient Prescriptions    Medication Sig Dispense Refill  . albuterol (PROVENTIL HFA;VENTOLIN HFA) 108 (90 BASE) MCG/ACT inhaler Inhale 2 puffs into the lungs every 4 (four) hours as needed for wheezing or shortness of breath. 1 Inhaler 12  . aspirin 81 MG chewable tablet Chew 1 tablet (81 mg total) by mouth daily. 30 tablet 1  . atorvastatin (LIPITOR) 80 MG tablet Take 1 tablet (80 mg total) by mouth daily at 6 PM. 30 tablet 3  . budesonide-formoterol (SYMBICORT) 80-4.5 MCG/ACT inhaler Inhale 2 puffs into the lungs 2 (two) times daily. 1 Inhaler 12  . furosemide (LASIX) 40 MG tablet   0  . gabapentin (NEURONTIN) 300 MG capsule Take 3 capsules (900 mg total) by mouth 3 (three) times daily. 270 capsule 6  . HYDROcodone-acetaminophen (NORCO) 10-325 MG per tablet Take 1 tablet by mouth every 6 (six) hours as needed for moderate pain or severe pain. 30 tablet 0  . ipratropium-albuterol (DUONEB) 0.5-2.5 (3) MG/3ML SOLN INHALE CONTENTS OF 1 VIAL VIA NEBULIZER EVERY 6 HOURS  5  . pantoprazole (PROTONIX) 40 MG tablet Take 1 tablet (40 mg total) by mouth daily. 30 tablet 1  . potassium chloride (K-DUR) 10 MEQ tablet TK 2 TS PO D  3  . predniSONE (DELTASONE) 5 MG tablet Take 1 tablet (5 mg total) by mouth daily with breakfast. 30 tablet 1  .  saccharomyces boulardii (FLORASTOR) 250 MG capsule Take 250 mg by mouth 2 (two) times daily.     No current facility-administered medications for this visit.      Past Surgical History:  Procedure Laterality Date  . ABDOMINAL HYSTERECTOMY  1971  . APPENDECTOMY    . BACK SURGERY     X3  . BIOPSY  03/06/2014   Procedure: BIOPSY;  Surgeon: Danie Binder, MD;  [facility];  Service: Endoscopy;;  . COLONOSCOPY  June 2002   Dr. Ferdinand Lango: severe diverticular disease with haustral hypertrophy, primary and secondary diverticulosis, persistent spasticity, early stenosis  . COLONOSCOPY WITH PROPOFOL N/A 06/21/2013   Dr. Oneida Alar: sigmoid colon and descending colon with diverticula, small  internal hemorrhoids  . ESOPHAGOGASTRODUODENOSCOPY N/A 03/06/2014   Procedure: ESOPHAGOGASTRODUODENOSCOPY (EGD);  Surgeon: Danie Binder, MD;  [facility];  Service: Endoscopy;  Laterality: N/A;  9;30  . EYE SURGERY     both cataracts-lenses  . LEFT HEART CATHETERIZATION WITH CORONARY ANGIOGRAM N/A 12/21/2014   Procedure: LEFT HEART CATHETERIZATION WITH CORONARY ANGIOGRAM;  Surgeon: Belva Crome, MD;  Location[facility] C[facility] LAB;  Service: Cardiovascular;  Laterality: N/A;  . NASAL SINUS SURGERY Right 07/11/2013   Procedure: RIGHT ENDOSCOPIC ANTERIOR ETHMOIDECTOMY/RIGHT ENDOSCOPIC MAXILLARY ANTROSTOMY WITH REMOVAL OF TISSUE;  Surgeon: Ascencion Dike, MD;  Location: [place];  Service: ENT;  Laterality: Right;  . OTHER SURGICAL HISTORY     scar tissue removal x2  . SAVORY DILATION N/A 03/06/2014   Procedure: SAVORY DILATION;  Surgeon: Danie Binder, MD;  [facility];  Service: Endoscopy;  Laterality: N/A;  . TONSILLECTOMY       Allergies  Allergen Reactions  . Codeine Other (See Comments)    Makes patient not in right state of mind.   . Oxycodone Itching  . Talwin [Pentazocine] Other (See Comments)    Makes patient not in right state of mind.   . Valium [Diazepam] Other (See Comments)    Just doesn't work.       Family History  Problem Relation Age of Onset  . Breast cancer Mother   . Heart disease Mother   . Hypertension Mother   . Heart attack Mother   . Other Mother        varicose veins  . Cancer Mother   . Varicose Veins Mother   . Throat cancer Father   . Cancer Father   . Diabetes Brother   . Hypertension Brother   . Heart disease Brother   . Colon cancer Neg Hx      Social History Ms. Groseclose reports that she quit smoking about 2 years ago. Her smoking use included Cigarettes. She has a 50.00 pack-year smoking history. She has never used smokeless tobacco. Ms. Labrum reports that she does not drink alcohol.   Review of  Systems CONSTITUTIONAL: No weight loss, fever, chills, weakness or fatigue.  HEENT: Eyes: No visual loss, blurred vision, double vision or yellow sclerae.No hearing loss, sneezing, congestion, runny nose or sore throat.  SKIN: No rash or itching.  CARDIOVASCULAR: per hpi RESPIRATORY: per hpi GASTROINTESTINAL: No anorexia, nausea, vomiting or diarrhea. No abdominal pain or blood.  GENITOURINARY: No burning on urination, no polyuria NEUROLOGICAL: No headache, dizziness, syncope, paralysis, ataxia, numbness or tingling in the extremities. No change in bowel or bladder control.  MUSCULOSKELETAL: No muscle, back pain, joint pain or stiffness.  LYMPHATICS: No enlarged nodes. No history of splenectomy.  PSYCHIATRIC: No  history of depression or anxiety.  ENDOCRINOLOGIC: No reports of sweating, cold or heat intolerance. No polyuria or polydipsia.  Marland Kitchen   Physical Examination Vitals:   05/20/17 1257  BP: 100/64  Pulse: 87  SpO2: 94%   Vitals:   05/20/17 1257  Weight: 120 lb (54.4 kg)  Height: 5\' 3"  (1.6 m)    Gen: resting comfortably, no acute distress HEENT: no scleral icterus, pupils equal round and reactive, no palptable cervical adenopathy,  CV: RRR, no m//r,g no jvd Resp: Clear to auscultation bilaterally GI: abdomen is soft, non-tender, non-distended, normal bowel sounds, no hepatosplenomegaly MSK: extremities are warm, no edema.  Skin: warm, no rash Neuro:  no focal deficits Psych: appropriate affect   Diagnostic Studies 10/2014 echo Study Conclusions  - Left ventricle: The cavity size was normal. Wall thickness was normal. Systolic function was normal. The estimated ejection fraction was in the range of 55% to 60%. Doppler parameters are consistent with abnormal left ventricular relaxation (grade 1 diastolic dysfunction). - Aortic valve: Mildly calcified annulus. Trileaflet; normal thickness leaflets. Valve area (VTI): 2.97 cm^2. Valve area (Vmax): 2.85  cm^2. - Inferior vena cava: The vessel was dilated. The respirophasic diameter changes were blunted (<50%), consistent with elevated central venous pressure. - Technically difficult study.   12/2014 Cath HEMODYNAMICS: Aortic pressure 114/62 mmHg; LV pressure 1:15/7 mmHg; LVEDP 14 mmHg  ANGIOGRAPHIC DATA: The left main coronary artery is calcified but widely patent.  The left anterior descending artery is widely patent and wraps around the left ventricular apex. The proximal segment has heavy calcification. Within the proximal calcified segment there is ectasia. No obstruction is noted. Tortuosity is noted in the mid and distal LAD beyond the diagonal. No obstruction is seen..  The left circumflex artery is moderately calcified. 4 obtuse marginals arise from the circumflex. No significant obstruction is seen..  The right coronary artery is large vessel giving origin to a tortuous PDA. The proximal RCA contains 40% narrowing. Irregularities are noted in the mid vessel.Marland Kitchen  LEFT VENTRICULOGRAM: Left ventricular angiogram was done in the 30 RAO projection and revealed normal sized LV cavity with an EF of 55%.   IMPRESSIONS: 1. Moderate to heavy three-vessel coronary calcification  2. No significant obstructive coronary disease is noted. There is an eccentric 40% proximal RCA.  3. Overall normal LV function with EF 55% and no regional wall motion abnormality   RECOMMENDATION: Further management per treating team.    10/2014 Carotid US IMPRESSION: 1. Bilateral proximal ICA plaque resulting in 50-69 % diameter stenosis on the left, less than 50% diameter stenosis on the right. The exam does not exclude plaque ulceration or embolization. Continued surveillance recommended.    Assessment and Plan  1. CAD - moderate nonobstructive disease by cath.  - has atypical symptoms at times. We will continue to monitor.   2. Chronic diastolic heart failure -no  recent symptoms, continue current meds   3. Carotid stenosis - we will repeat carotid US      Arnoldo Lenis, M.D.

## 2017-06-01 ENCOUNTER — Encounter (HOSPITAL_COMMUNITY)
Admission: RE | Admit: 2017-06-01 | Discharge: 2017-06-01 | Disposition: A | Discharge status: Home / Self Care | Payer: Medicare Other | Source: Ambulatory Visit | Attending: Cardiology | Admitting: Cardiology

## 2017-06-01 ENCOUNTER — Encounter (HOSPITAL_COMMUNITY): Payer: Self-pay

## 2017-06-01 ENCOUNTER — Encounter (HOSPITAL_BASED_OUTPATIENT_CLINIC_OR_DEPARTMENT_OTHER)
Admission: RE | Admit: 2017-06-01 | Discharge: 2017-06-01 | Disposition: A | Discharge status: Home / Self Care | Payer: Medicare Other | Source: Ambulatory Visit | Attending: Cardiology | Admitting: Cardiology

## 2017-06-01 ENCOUNTER — Ambulatory Visit (HOSPITAL_COMMUNITY)
Admission: RE | Admit: 2017-06-01 | Discharge: 2017-06-01 | Disposition: A | Discharge status: Home / Self Care | Payer: Medicare Other | Source: Ambulatory Visit | Attending: Cardiology | Admitting: Cardiology

## 2017-06-01 DIAGNOSIS — R079 Chest pain, unspecified: Secondary | ICD-10-CM

## 2017-06-01 DIAGNOSIS — I6523 Occlusion and stenosis of bilateral carotid arteries: Secondary | ICD-10-CM

## 2017-06-01 HISTORY — DX: Unspecified asthma, uncomplicated: J45.909

## 2017-06-01 HISTORY — DX: Heart failure, unspecified: I50.9

## 2017-06-01 LAB — NM MYOCAR MULTI W/SPECT W/WALL MOTION / EF
LV dias vol: 67 mL (ref 46–106)
LV sys vol: 25 mL
Peak HR: 155 {beats}/min
RATE: 0.36
Rest HR: 66 {beats}/min
SDS: 1
SRS: 2
SSS: 3
TID: 1.04

## 2017-06-01 MED ORDER — ALBUTEROL SULFATE (2.5 MG/3ML) 0.083% IN NEBU
INHALATION_SOLUTION | RESPIRATORY_TRACT | Status: AC
Start: 2017-06-01 — End: 2017-06-01
  Administered 2017-06-01: 2.5 mg
  Filled 2017-06-01: qty 3

## 2017-06-01 MED ORDER — ALBUTEROL SULFATE (2.5 MG/3ML) 0.083% IN NEBU
2.5000 mg | INHALATION_SOLUTION | Freq: Once | RESPIRATORY_TRACT | Status: AC
  Filled 2017-06-01: qty 3

## 2017-06-01 MED ORDER — TECHNETIUM TC 99M TETROFOSMIN IV KIT
30.0000 | PACK | Freq: Once | INTRAVENOUS | Status: AC | PRN
  Administered 2017-06-01: 32 via INTRAVENOUS

## 2017-06-01 MED ORDER — DOBUTAMINE IN D5W 4-5 MG/ML-% IV SOLN
INTRAVENOUS | Status: AC
  Administered 2017-06-01: 10 ug via INTRAVENOUS
  Filled 2017-06-01: qty 250

## 2017-06-01 MED ORDER — TECHNETIUM TC 99M TETROFOSMIN IV KIT
10.0000 | PACK | Freq: Once | INTRAVENOUS | Status: AC | PRN
  Administered 2017-06-01: 10.8 via INTRAVENOUS

## 2017-06-01 MED ORDER — SODIUM CHLORIDE 0.9% FLUSH
INTRAVENOUS | Status: AC
Start: 2017-06-01 — End: 2017-06-01
  Administered 2017-06-01: 10 mL via INTRAVENOUS
  Filled 2017-06-01: qty 10

## 2017-06-01 NOTE — Progress Notes (Signed)
Respiratory Care Note: Responded to rapid response in cardiac rehab. When I arrived the patient was on a 10L NRB mask due to the patient having trouble with her breathing. The patient was given an albuterol treatment ordered per Jory Sims, NP. The patient had a positive response to the breathing treatment. It was stated the patient had taken an inhaler earlier today. The patient has a history of COPD and is on home oxygen of 3.5L. Patient was also educated on the importance and use of a spacer when using her inhaler.

## 2017-07-31 NOTE — Progress Notes (Addendum)
Cardiology Office Note   Date:  08/03/2017   ID:  [name], DOB 1941-05-01, MRN [id number]  PCP:  Edmonia James, PA-C  Cardiologist:  Carlyle Dolly, MD  Chief Complaint  Patient presents with  . Coronary Artery Disease  . Congestive Heart Failure    Diastolic  . Carotid    Bilateral caroitid artery disease     History of Present Illness: Lindsay Mitchell is a 76 y.o. female who presents for ongoing assessment and management of coronary artery disease, most recent catheterization in April 2016 revealed moderate nonobstructive disease, anxiety induced chest discomfort.  Chronic diastolic heart failure, carotid artery stenosis, COPD on home O2, history of DVT (no longer on anticoagulation),.  She is here for 43-monthfollow-up, with repeat carotid ultrasound prior to visit.  Carotid Artery Ultrasound 06/01/2017  Bilateral carotid atherosclerotic disease, left side greater than right. Peak systolic velocity in left internal carotid artery has increased since 2016. Estimated degree of stenosis in left internal carotid artery is now greater than 70%.  Estimated degree of stenosis in the right internal carotid artery is less than 50%.  Patent vertebral arteries with antegrade flow.  NM Stress Test  06/01/2017  There was no ST segment deviation noted during stress.  Defect 1: There is a medium defect of moderate severity present in the mid inferoseptal location, which appears to be due to soft tissue attenuation artifact.  The study is normal. No myocardial ischemia or scar.  This is a low risk study.  Nuclear stress EF: 63%.   She is without any further cardiac complaints today.  Her breathing status remains tenuous and she continues on home oxygen.  She forgot today so we will place her on it in the exam room.  Before I began to explain her test results, she wanted to be very clear that she wished no further cardiac intervention unless it was emergent.  She does not  wish to have carotid endarterectomy unless the stenosis was life-threatening.   Past Medical History:  Diagnosis Date  . Anxiety   . Asthma   . Carotid artery disease ([facility]    a. duplex 38/6754 RICA <<49% LICA 520-10% Followed by [facility].  . CHF (congestive heart failure) ([facility]   . Constipation   . COPD (chronic obstructive pulmonary disease) (HCC)    Emphysema radiographically  . Depression with anxiety   . Diverticulosis   . DVT (deep venous thrombosis) ([facility]   . Fibromyalgia   . First degree AV block   . Full dentures   . Migraine   . Myocardial infarction ([facility]   . On home O2   . Protein calorie malnutrition ([facility]   . Pulmonary nodules 08/11/2013   a. CT angio 08/2013: 4-562mnodules in RUL/RML, f/u recommended 6 months.  . Stroke ([facility]  . TIA (transient ischemic attack)     Past Surgical History:  Procedure Laterality Date  . ABDOMINAL HYSTERECTOMY  1971  . APPENDECTOMY    . BACK SURGERY     X3  . BIOPSY  03/06/2014   Procedure: BIOPSY;  Surgeon: SaDanie BinderMD;  [facility];  Service: Endoscopy;;  . COLONOSCOPY  June 2002   Dr. PeFerdinand Langosevere diverticular disease with haustral hypertrophy, primary and secondary diverticulosis, persistent spasticity, early stenosis  . COLONOSCOPY WITH PROPOFOL N/A 06/21/2013   Dr. FiOneida Alarsigmoid colon and descending colon with diverticula, small internal hemorrhoids  . ESOPHAGOGASTRODUODENOSCOPY N/A 03/06/2014   Procedure: ESOPHAGOGASTRODUODENOSCOPY (EGD);  Surgeon:  Danie Binder, MD;  [facility];  Service: Endoscopy;  Laterality: N/A;  9;30  . EYE SURGERY     both cataracts-lenses  . LEFT HEART CATHETERIZATION WITH CORONARY ANGIOGRAM N/A 12/21/2014   Procedure: LEFT HEART CATHETERIZATION WITH CORONARY ANGIOGRAM;  Surgeon: Belva Crome, MD;  Location: [facility] CATH LAB;  Service: Cardiovascular;  Laterality: N/A;  . NASAL SINUS SURGERY Right 07/11/2013   Procedure: RIGHT ENDOSCOPIC ANTERIOR ETHMOIDECTOMY/RIGHT  ENDOSCOPIC MAXILLARY ANTROSTOMY WITH REMOVAL OF TISSUE;  Surgeon: Ascencion Dike, MD;  Location: [facility];  Service: ENT;  Laterality: Right;  . OTHER SURGICAL HISTORY     scar tissue removal x2  . SAVORY DILATION N/A 03/06/2014   Procedure: SAVORY DILATION;  Surgeon: Danie Binder, MD;  [facility];  Service: Endoscopy;  Laterality: N/A;  . TONSILLECTOMY       Current Outpatient Medications  Medication Sig Dispense Refill  . albuterol (PROVENTIL HFA;VENTOLIN HFA) 108 (90 BASE) MCG/ACT inhaler Inhale 2 puffs into the lungs every 4 (four) hours as needed for wheezing or shortness of breath. 1 Inhaler 12  . aspirin 81 MG chewable tablet Chew 1 tablet (81 mg total) by mouth daily. 30 tablet 1  . atorvastatin (LIPITOR) 80 MG tablet Take 1 tablet (80 mg total) by mouth daily at 6 PM. 30 tablet 3  . budesonide-formoterol (SYMBICORT) 80-4.5 MCG/ACT inhaler Inhale 2 puffs into the lungs 2 (two) times daily. 1 Inhaler 12  . furosemide (LASIX) 40 MG tablet Take 40 mg by mouth daily.   0  . gabapentin (NEURONTIN) 300 MG capsule Take 3 capsules (900 mg total) by mouth 3 (three) times daily. 270 capsule 6  . HYDROcodone-acetaminophen (NORCO) 10-325 MG per tablet Take 1 tablet by mouth every 6 (six) hours as needed for moderate pain or severe pain. 30 tablet 0  . ipratropium-albuterol (DUONEB) 0.5-2.5 (3) MG/3ML SOLN INHALE CONTENTS OF 1 VIAL VIA NEBULIZER EVERY 6 HOURS  5  . pantoprazole (PROTONIX) 40 MG tablet Take 1 tablet (40 mg total) by mouth daily. 30 tablet 1  . potassium chloride (K-DUR) 10 MEQ tablet TK 2 TS PO D  3  . predniSONE (DELTASONE) 5 MG tablet Take 1 tablet (5 mg total) by mouth daily with breakfast. 30 tablet 1  . saccharomyces boulardii (FLORASTOR) 250 MG capsule Take 250 mg by mouth 2 (two) times daily.     No current facility-administered medications for this visit.     Allergies:   Codeine; Oxycodone; Talwin [pentazocine]; and Valium [diazepam]     Social History:  The patient  reports that she quit smoking about 2 years ago. Her smoking use included cigarettes. She has a 50.00 pack-year smoking history. she has never used smokeless tobacco. She reports that she does not drink alcohol or use drugs.   Family History:  The patient's family history includes Breast cancer in her mother; Cancer in her father and mother; Diabetes in her brother; Heart attack in her mother; Heart disease in her brother and mother; Hypertension in her brother and mother; Other in her mother; Throat cancer in her father; Varicose Veins in her mother.    ROS: All other systems are reviewed and negative. Unless otherwise mentioned in H&P    PHYSICAL EXAM: VS:  BP 116/66   Pulse 89   Ht '5\' 4"'  (1.626 m)   Wt 118 lb (53.5 kg)   BMI 20.25 kg/m  , BMI Body mass index is 20.25 kg/m. GEN:  Well nourished, well developed, in no acute distress frail.  Wearing oxygen via nasal cannula provided in the office HEENT: normal  Neck: no JVD, carotid bruits, or masses Cardiac: RRR; no murmurs, rubs, or gallops,no edema  Respiratory: Inspiratory expiratory crackles with inspiratory wheezes.  O2 at 3-1/2 L GI: soft, nontender, nondistended, + BS MS: no deformity or atrophy  Skin: warm and dry, no rash Neuro:  Strength and sensation are intact Psych: euthymic mood, full affect   Recent Labs: No results found for requested labs within last 8760 hours.    Lipid Panel    Component Value Date/Time   CHOL 81 12/23/2014 0515   CHOL 192 01/10/2013 1748   TRIG 49 12/23/2014 0515   TRIG 145 04/12/2013 1653   TRIG 116 01/10/2013 1748   HDL 26 (L) 12/23/2014 0515   HDL 61 04/12/2013 1653   HDL 55 01/10/2013 1748   CHOLHDL 3.1 12/23/2014 0515   VLDL 10 12/23/2014 0515   LDLCALC 45 12/23/2014 0515   LDLCALC 73 04/12/2013 1653   LDLCALC 114 (H) 01/10/2013 1748      Wt Readings from Last 3 Encounters:  08/03/17 118 lb (53.5 kg)  05/20/17 120 lb (54.4 kg)  11/14/16  120 lb (54.4 kg)      Other studies Reviewed: As above  ASSESSMENT AND PLAN:  1.  Coronary artery disease: Most recent nuclear medicine stress test dated 06/01/2017 was low risk.  Symptoms are likely related to COPD, she offers no chest pain, her breathing status remains tenuous.  Continue secondary prevention with aspirin and atorvastatin.  2.  Carotid artery disease: Recent carotid Doppler ultrasound revealed left internal carotid artery stenosis of 70%.  Less than 50% on the right.  The patient adamantly refuses any interventional surgery for carotid artery stenosis unless this is emergently required.  Continue statin therapy and aspirin  3.  Chronic dyspnea: She remains on oxygen 2.5 L via nasal cannula.  She should continue with her primary care physician and pulmonologist if clinically warranted.  4.  Chronic diastolic heart failure: No evidence of decompensation on exam.  Will repeat be met to evaluate kidney function as she is on Lasix.   Current medicines are reviewed at length with the patient today.  Follow-up in 6 months unless she becomes symptomatic.  Labs/ tests ordered today include: BMET  Lindsay Mitchell, ANP, AACC   08/03/2017 12:53 PM    [facility] [address] [place], [place] [address] Phone: [phone]; Fax: [phone]

## 2017-08-03 ENCOUNTER — Encounter: Payer: Self-pay | Admitting: Adult Health

## 2017-08-03 ENCOUNTER — Telehealth: Payer: Self-pay | Admitting: *Deleted

## 2017-08-03 ENCOUNTER — Ambulatory Visit: Payer: Medicare Other | Admitting: Adult Health

## 2017-08-03 ENCOUNTER — Other Ambulatory Visit (HOSPITAL_COMMUNITY)
Admission: RE | Admit: 2017-08-03 | Discharge: 2017-08-03 | Disposition: A | Discharge status: Home / Self Care | Payer: Medicare Other | Source: Ambulatory Visit | Attending: Adult Health | Admitting: Adult Health

## 2017-08-03 VITALS — BP 116/66 | HR 89 | Ht 64.0 in | Wt 118.0 lb

## 2017-08-03 DIAGNOSIS — N289 Disorder of kidney and ureter, unspecified: Secondary | ICD-10-CM | POA: Diagnosis present

## 2017-08-03 DIAGNOSIS — I5032 Chronic diastolic (congestive) heart failure: Secondary | ICD-10-CM

## 2017-08-03 DIAGNOSIS — I6523 Occlusion and stenosis of bilateral carotid arteries: Secondary | ICD-10-CM

## 2017-08-03 DIAGNOSIS — I2510 Atherosclerotic heart disease of native coronary artery without angina pectoris: Secondary | ICD-10-CM | POA: Diagnosis not present

## 2017-08-03 LAB — BASIC METABOLIC PANEL
Anion gap: 10 (ref 5–15)
BUN: 24 mg/dL — ABNORMAL HIGH (ref 6–20)
CO2: 36 mmol/L — ABNORMAL HIGH (ref 22–32)
Calcium: 9.4 mg/dL (ref 8.9–10.3)
Chloride: 93 mmol/L — ABNORMAL LOW (ref 101–111)
Creatinine, Ser: 0.98 mg/dL (ref 0.44–1.00)
GFR calc Af Amer: >60 mL/min (ref >60)
GFR calc non Af Amer: 55 mL/min — ABNORMAL LOW (ref >60)
Glucose, Bld: 128 mg/dL — ABNORMAL HIGH (ref 65–99)
Potassium: 3.5 mmol/L (ref 3.5–5.1)
Sodium: 139 mmol/L (ref 135–145)

## 2017-08-03 NOTE — Patient Instructions (Signed)
Medication Instructions:  Your physician recommends that you continue on your current medications as directed. Please refer to the Current Medication list given to you today.   Labwork: Your physician recommends that you return for lab work in: Today    Testing/Procedures: NONE   Follow-Up: Your physician wants you to follow-up in: 6 Months with Dr. Harl Bowie. You will receive a reminder letter in the mail two months in advance. If you don't receive a letter, please call our office to schedule the follow-up appointment.   Any Other Special Instructions Will Be Listed Below (If Applicable).     If you need a refill on your cardiac medications before your next appointment, please call your pharmacy.  Thank you for choosing [facility]!

## 2017-08-03 NOTE — Telephone Encounter (Signed)
Called patient with test results. No answer. Phone Busy

## 2017-08-03 NOTE — Telephone Encounter (Signed)
-----   Message from Lendon Colonel, NP sent at 08/03/2017  4:46 PM EST ----- Reviewed labs. Potasium is low normal. She is on potassium supplement. No changes in regimen, no evidence of dehydration.

## 2017-08-04 ENCOUNTER — Telehealth: Payer: Self-pay | Admitting: Adult Health

## 2017-08-04 DIAGNOSIS — I6523 Occlusion and stenosis of bilateral carotid arteries: Secondary | ICD-10-CM

## 2017-08-04 NOTE — Addendum Note (Signed)
Addended by: Levonne Hubert on: 08/04/2017 11:26 AM   Modules accepted: Orders

## 2017-08-04 NOTE — Telephone Encounter (Signed)
Patient states that she reviewed her carotid US at home after leaving OV yesterday. During visit she understood that her carotid was better this time than in 2016. She read that her stenosis is worse than 2016 and would like to know if she would like the results to be review again, to see if she needs to be referred to [facility].

## 2017-08-04 NOTE — Telephone Encounter (Signed)
She does not need to be referred to [facility] at this time. She stated that she did not want to have carotid surgery unless it was emergent or critical. The results are slightly worse compared to previous testing. If she would like to be sent to [facility] for her own piece of mind we can. She indicated to me that she did not want to have any surgery.

## 2017-08-04 NOTE — Telephone Encounter (Signed)
Called pt. Phone busy

## 2017-08-04 NOTE — Telephone Encounter (Signed)
Go ahead and refer to [facility]. She is anxious and this may give her more information. With progressive disease they may want to follow her more closely.

## 2017-08-04 NOTE — Telephone Encounter (Signed)
Patient states that she has questions regarding OV yesterdayj. / tg

## 2017-08-05 NOTE — Telephone Encounter (Signed)
Patient notified

## 2017-08-28 ENCOUNTER — Encounter (HOSPITAL_COMMUNITY): Payer: Medicare Other

## 2017-08-28 ENCOUNTER — Ambulatory Visit: Payer: Medicare Other | Admitting: Vascular Surgery

## 2017-09-03 ENCOUNTER — Other Ambulatory Visit: Payer: Self-pay

## 2017-09-03 DIAGNOSIS — I6523 Occlusion and stenosis of bilateral carotid arteries: Secondary | ICD-10-CM

## 2017-12-29 ENCOUNTER — Telehealth: Payer: Self-pay | Admitting: Cardiology

## 2017-12-29 DIAGNOSIS — Z79899 Other long term (current) drug therapy: Secondary | ICD-10-CM

## 2017-12-29 NOTE — Telephone Encounter (Signed)
Lindsay Mitchell w/ [facility] heart health program--   pt uses their scales and tablets to keep track of her weight. Pt has had a 4.6lb weight gain over 4 days, w/ increased swelling and SOB. Please call the pt @ [phone].   Jocelyn Lamer is going to fax over a record of the pt's weight, her # will be on there if needed.

## 2017-12-30 NOTE — Telephone Encounter (Signed)
Spoke with pt. Informed her to increased lasix to 40 mg BID today & tomorrow. She voiced understanding. She will call Friday with updates.

## 2017-12-30 NOTE — Telephone Encounter (Signed)
Verify taking lasix 40mg  daily, if so increase to 40mg  bid x 2 days (Wed and Thurs), then update [place] again on Friday    J Kervens Roper MD

## 2017-12-30 NOTE — Telephone Encounter (Signed)
Faxed over weights to Dr. Harl Bowie @ [place] office. I will await his recommendations.

## 2018-01-01 NOTE — Telephone Encounter (Signed)
Spoke with pt regarding wt gain. And urine output. Pt reports Wt on Tues at 120 lbs, Wed. 120.8 lbs with clear urine, Thurs. 117 lbs with only 2 voids and today wt is 118.2. Pt reports taking extra lasix today too. She states that her " kidneys are hurting". Please advise.

## 2018-01-01 NOTE — Telephone Encounter (Signed)
Called pt no answer. Phone busy. Will try later.

## 2018-01-01 NOTE — Telephone Encounter (Signed)
Lasix would not cause her kidneys to hurt, something else maybe going on and she would need to discuss with her pcp, perhaps a urinary tract infection. Her weight has some down some, ok to go back to her prevoius lasix dosing of 40mg  daily. Can we get a BMET/Mg for her today or Monday   Carlyle Dolly MD

## 2018-01-05 NOTE — Addendum Note (Signed)
Addended by: Levonne Hubert on: 01/05/2018 08:58 AM   Modules accepted: Orders

## 2018-01-05 NOTE — Telephone Encounter (Signed)
Pt states she resumed lasix 40 mg on Sat. Pt notified of labs. She lives in [place] and has trouble with transportation to [place]. Order placed to [place] so pt may have done in [place].

## 2018-02-03 ENCOUNTER — Encounter (HOSPITAL_COMMUNITY): Payer: Medicare Other

## 2018-02-03 ENCOUNTER — Ambulatory Visit: Payer: Medicare Other | Admitting: Vascular Surgery

## 2018-02-08 ENCOUNTER — Telehealth: Payer: Self-pay | Admitting: Cardiology

## 2018-02-08 NOTE — Telephone Encounter (Signed)
Verify she is taking lasix 40mg  daily, if so would increase to 40mg  bid x 3 days then back to 40mg  daily. Update [place] on weights and symptoms on Thursday    J Keilan Nichol MD

## 2018-02-08 NOTE — Telephone Encounter (Signed)
Patient c/o LLQ pain for several days. Also c/o weight gain of 1lb per day for the past 4-5 days. Baseline weight is 115 lbs. Weight today is 122.8 lbs. Patient c/o having a little swelling in her left foot, abdomen, c/o it being harder to breath the last couple of days, no c/o chest pain. Admits to adding salt to food sometimes. Denies drinking excess fluids. Patient scheduled to see Dr. Harl Bowie on 02/11/18 @4 :20 pm since she is due for her 6 month f/u and advised that message would be sent to her doctor for further advice. Advised patient to go to the ED for an evaluation if her symptoms got worse. Verbalized understanding. Medications reconciled while on phone. Please see recent weight readings below:  06/05 118.4 lbs 06/06 119.8 lbs 06/07 120.8 lbs 06/08 120.2 lbs 06/09 121.2 lbs 06/10 122.8 lbs  Currently taking furosemide 20 mg taking 2 in the morning & potassium chloride 10 meq taking one BID to manage CHF>.

## 2018-02-08 NOTE — Telephone Encounter (Signed)
Verified with patient that she is currently taking furosemide 40 mg daily as listed below. Patient informed to increase to BID for 3 days, then resume daily, advised to bring weights to visit on Thursday. Verbalized understanding of plan.

## 2018-02-08 NOTE — Telephone Encounter (Signed)
Patient is calling in regards to weight gain / tg

## 2018-02-11 ENCOUNTER — Ambulatory Visit: Payer: Medicare Other | Admitting: Cardiology

## 2018-02-11 ENCOUNTER — Encounter: Payer: Self-pay | Admitting: Cardiology

## 2018-02-11 VITALS — BP 110/60 | HR 113 | Ht 63.0 in | Wt 120.0 lb

## 2018-02-11 DIAGNOSIS — I5033 Acute on chronic diastolic (congestive) heart failure: Secondary | ICD-10-CM

## 2018-02-11 DIAGNOSIS — I2510 Atherosclerotic heart disease of native coronary artery without angina pectoris: Secondary | ICD-10-CM

## 2018-02-11 MED ORDER — FUROSEMIDE 40 MG PO TABS: ORAL_TABLET | ORAL | 3 refills | Status: AC

## 2018-02-11 NOTE — Patient Instructions (Signed)
Medication Instructions:  Your physician has recommended you make the following change in your medication:  Start Lasix 40 mg Daily May take extra for wt gain above 117 lbs.    Labwork: NONE   Testing/Procedures: NONE   Follow-Up: Your physician wants you to follow-up in: 6 months. You will receive a reminder letter in the mail two months in advance. If you don't receive a letter, please call our office to schedule the follow-up appointment.   Any Other Special Instructions Will Be Listed Below (If Applicable).     If you need a refill on your cardiac medications before your next appointment, please call your pharmacy. Thank you for choosing [facility]!

## 2018-02-11 NOTE — Progress Notes (Signed)
Clinical Summary Lindsay Mitchell is a 77 y.o.female seen today for follow up of the following medical problems.   1. CAD -  admission with diverticular abscess and hypotension, developed elevated troponin - 12/2014 cath showed moderate non-obstructive disease  - no recent chest pain   2. Chronic diastolic heart failure.   - she reports baseline weight 115 lbs. At home weights trended up to 122 lbs, some swelling in legs and abdomen. Some SOB.   - mild edema at times. She reports no increase in urine output with recent diuretic increase to 40mg  bid, but weights dropped down 122 to 118 at home.   3. COPD -compliant with inhalers.  - uses home O2  - follwed by Dr Ma Rings by [facility].    4. Carotid stenosis - followed by vascular    Past Medical History:  Diagnosis Date  . Anxiety   . Asthma   . Carotid artery disease ([place])    a. duplex 10/3293: RICA <18%, LICA 84-16%. Followed by [facility].  . CHF (congestive heart failure) ([place])   . Constipation   . COPD (chronic obstructive pulmonary disease) ([facility])    Emphysema radiographically  . Depression with anxiety   . Diverticulosis   . DVT (deep venous thrombosis) ([place])   . Fibromyalgia   . First degree AV block   . Full dentures   . Migraine   . Myocardial infarction ([place])   . On home O2   . Protein calorie malnutrition ([place])   . Pulmonary nodules 08/11/2013   a. CT angio 08/2013: 4-62mm nodules in RUL/RML, f/u recommended 6 months.  . Stroke ([place])   . TIA (transient ischemic attack)      Allergies  Allergen Reactions  . Tramadol Nausea And Vomiting  . Codeine Other (See Comments)    Makes patient not in right state of mind.   . Oxycodone Itching  . Talwin [Pentazocine] Other (See Comments)    Makes patient not in right state of mind.   . Valium [Diazepam] Other (See Comments)    Just doesn't work.      Current Outpatient Medications  Medication Sig Dispense Refill  . albuterol (PROVENTIL HFA;VENTOLIN HFA) 108  (90 BASE) MCG/ACT inhaler Inhale 2 puffs into the lungs every 4 (four) hours as needed for wheezing or shortness of breath. 1 Inhaler 12  . aspirin 81 MG chewable tablet Chew 1 tablet (81 mg total) by mouth daily. 30 tablet 1  . atorvastatin (LIPITOR) 80 MG tablet Take 1 tablet (80 mg total) by mouth daily at 6 PM. 30 tablet 3  . budesonide-formoterol (SYMBICORT) 80-4.5 MCG/ACT inhaler Inhale 2 puffs into the lungs 2 (two) times daily. 1 Inhaler 12  . furosemide (LASIX) 40 MG tablet Take 40 mg by mouth daily.   0  . gabapentin (NEURONTIN) 300 MG capsule Take 3 capsules (900 mg total) by mouth 3 (three) times daily. 270 capsule 6  . HYDROcodone-acetaminophen (NORCO) 10-325 MG per tablet Take 1 tablet by mouth every 6 (six) hours as needed for moderate pain or severe pain. 30 tablet 0  . ipratropium-albuterol (DUONEB) 0.5-2.5 (3) MG/3ML SOLN INHALE CONTENTS OF 1 VIAL VIA NEBULIZER EVERY 6 HOURS  5  . pantoprazole (PROTONIX) 40 MG tablet Take 1 tablet (40 mg total) by mouth daily. 30 tablet 1  . potassium chloride (K-DUR) 10 MEQ tablet TK 2 TS PO D  3  . predniSONE (DELTASONE) 5 MG tablet Take 1 tablet (5 mg total) by mouth  daily with breakfast. 30 tablet 1  . saccharomyces boulardii (FLORASTOR) 250 MG capsule Take 250 mg by mouth 2 (two) times daily.     No current facility-administered medications for this visit.      Past Surgical History:  Procedure Laterality Date  . ABDOMINAL HYSTERECTOMY  1971  . APPENDECTOMY    . BACK SURGERY     X3  . BIOPSY  03/06/2014   Procedure: BIOPSY;  Surgeon: Danie Binder, MD;  [facility];  Service: Endoscopy;;  . COLONOSCOPY  June 2002   Dr. Ferdinand Lango: severe diverticular disease with haustral hypertrophy, primary and secondary diverticulosis, persistent spasticity, early stenosis  . COLONOSCOPY WITH PROPOFOL N/A 06/21/2013   Dr. Oneida Alar: sigmoid colon and descending colon with diverticula, small internal hemorrhoids  . ESOPHAGOGASTRODUODENOSCOPY  N/A 03/06/2014   Procedure: ESOPHAGOGASTRODUODENOSCOPY (EGD);  Surgeon: Danie Binder, MD;  [facility];  Service: Endoscopy;  Laterality: N/A;  9;30  . EYE SURGERY     both cataracts-lenses  . LEFT HEART CATHETERIZATION WITH CORONARY ANGIOGRAM N/A 12/21/2014   Procedure: LEFT HEART CATHETERIZATION WITH CORONARY ANGIOGRAM;  Surgeon: Belva Crome, MD;  Location: [facility] CATH LAB;  Service: Cardiovascular;  Laterality: N/A;  . NASAL SINUS SURGERY Right 07/11/2013   Procedure: RIGHT ENDOSCOPIC ANTERIOR ETHMOIDECTOMY/RIGHT ENDOSCOPIC MAXILLARY ANTROSTOMY WITH REMOVAL OF TISSUE;  Surgeon: Ascencion Dike, MD;  Location: [address];  Service: ENT;  Laterality: Right;  . OTHER SURGICAL HISTORY     scar tissue removal x2  . SAVORY DILATION N/A 03/06/2014   Procedure: SAVORY DILATION;  Surgeon: Danie Binder, MD;  [facility];  Service: Endoscopy;  Laterality: N/A;  . TONSILLECTOMY       Allergies  Allergen Reactions  . Tramadol Nausea And Vomiting  . Codeine Other (See Comments)    Makes patient not in right state of mind.   . Oxycodone Itching  . Talwin [Pentazocine] Other (See Comments)    Makes patient not in right state of mind.   . Valium [Diazepam] Other (See Comments)    Just doesn't work.       Family History  Problem Relation Age of Onset  . Breast cancer Mother   . Heart disease Mother   . Hypertension Mother   . Heart attack Mother   . Other Mother        varicose veins  . Cancer Mother   . Varicose Veins Mother   . Throat cancer Father   . Cancer Father   . Diabetes Brother   . Hypertension Brother   . Heart disease Brother   . Colon cancer Neg Hx      Social History Ms. Gildner reports that she quit smoking about 3 years ago. Her smoking use included cigarettes. She has a 50.00 pack-year smoking history. She has never used smokeless tobacco. Ms. Appelhans reports that she does not drink alcohol.   Review of Systems CONSTITUTIONAL: No  weight loss, fever, chills, weakness or fatigue.  HEENT: Eyes: No visual loss, blurred vision, double vision or yellow sclerae.No hearing loss, sneezing, congestion, runny nose or sore throat.  SKIN: No rash or itching.  CARDIOVASCULAR: per hpi RESPIRATORY: No shortness of breath, cough or sputum.  GASTROINTESTINAL: No anorexia, nausea, vomiting or diarrhea. No abdominal pain or blood.  GENITOURINARY: No burning on urination, no polyuria NEUROLOGICAL: No headache, dizziness, syncope, paralysis, ataxia, numbness or tingling in the extremities. No change in bowel or bladder control.  MUSCULOSKELETAL:  No muscle, back pain, joint pain or stiffness.  LYMPHATICS: No enlarged nodes. No history of splenectomy.  PSYCHIATRIC: No history of depression or anxiety.  ENDOCRINOLOGIC: No reports of sweating, cold or heat intolerance. No polyuria or polydipsia.  Marland Kitchen   Physical Examination Vitals:   02/11/18 1619  BP: 110/60  Pulse: (!) 113  SpO2: 93%   Vitals:   02/11/18 1619  Weight: 120 lb (54.4 kg)  Height: 5\' 3"  (1.6 m)    Gen: resting comfortably, no acute distress HEENT: no scleral icterus, pupils equal round and reactive, no palptable cervical adenopathy,  CV: RRR, no m/r/g, no jvd Resp: Clear to auscultation bilaterally GI: abdomen is soft, non-tender, non-distended, normal bowel sounds, no hepatosplenomegaly MSK: extremities are warm, no edema.  Skin: warm, no rash Neuro:  no focal deficits Psych: appropriate affect   Diagnostic Studies 10/2014 echo Study Conclusions  - Left ventricle: The cavity size was normal. Wall thickness was normal. Systolic function was normal. The estimated ejection fraction was in the range of 55% to 60%. Doppler parameters are consistent with abnormal left ventricular relaxation (grade 1 diastolic dysfunction). - Aortic valve: Mildly calcified annulus. Trileaflet; normal thickness leaflets. Valve area (VTI): 2.97 cm^2. Valve area (Vmax):  2.85 cm^2. - Inferior vena cava: The vessel was dilated. The respirophasic diameter changes were blunted (<50%), consistent with elevated central venous pressure. - Technically difficult study.   12/2014 Cath HEMODYNAMICS: Aortic pressure 114/62 mmHg; LV pressure 1:15/7 mmHg; LVEDP 14 mmHg  ANGIOGRAPHIC DATA: The left main coronary artery is calcified but widely patent.  The left anterior descending artery is widely patent and wraps around the left ventricular apex. The proximal segment has heavy calcification. Within the proximal calcified segment there is ectasia. No obstruction is noted. Tortuosity is noted in the mid and distal LAD beyond the diagonal. No obstruction is seen..  The left circumflex artery is moderately calcified. 4 obtuse marginals arise from the circumflex. No significant obstruction is seen..  The right coronary artery is large vessel giving origin to a tortuous PDA. The proximal RCA contains 40% narrowing. Irregularities are noted in the mid vessel.Marland Kitchen  LEFT VENTRICULOGRAM: Left ventricular angiogram was done in the 30 RAO projection and revealed normal sized LV cavity with an EF of 55%.   IMPRESSIONS: 1. Moderate to heavy three-vessel coronary calcification  2. No significant obstructive coronary disease is noted. There is an eccentric 40% proximal RCA.  3. Overall normal LV function with EF 55% and no regional wall motion abnormality   RECOMMENDATION: Further management per treating team.    10/2014 Carotid US IMPRESSION: 1. Bilateral proximal ICA plaque resulting in 50-69 % diameter stenosis on the left, less than 50% diameter stenosis on the right. The exam does not exclude plaque ulceration or embolization. Continued surveillance recommended.   06/2017 carotid US IMPRESSION: Bilateral carotid atherosclerotic disease, left side greater than right. Peak systolic velocity in left internal carotid artery has increased since  2016. Estimated degree of stenosis in left internal carotid artery is now greater than 70%.  Assessment and Plan  1. CAD - moderate nonobstructive disease by cath.  - no recent symptoms, continue current meds  2. Acute on chronic diastolic heart failure -recent weight gain, improved with increasing her lasix.  - change lasix to 40mg  daily, may take additional 40mg  daily for weight above 117 lbs.    F/u 6 months     Arnoldo Lenis, M.D.

## 2018-02-18 ENCOUNTER — Encounter: Payer: Self-pay | Admitting: Cardiology

## 2018-04-19 ENCOUNTER — Telehealth: Payer: Self-pay | Admitting: Cardiology

## 2018-04-19 NOTE — Telephone Encounter (Signed)
Belenda Cruise RN w/ [facility] called stating Ms. Holtrop is enrolled in the [place] program w/ [facility] and she has a tablet and scales at home to report problems--  Per Belenda Cruise, ptt states having SOB w/ activity & at rest, swelling in feet, and she was seen in ER.   Please give pt a call @ [phone]  Belenda Cruise can be reached @ [phone] ext [phone]

## 2018-04-20 NOTE — Telephone Encounter (Signed)
Attempt to reach, got VM,LMTCB-cc 

## 2018-04-23 NOTE — Telephone Encounter (Signed)
Attempt to reach again, got VM,LMTCB-cc

## 2018-04-26 NOTE — Telephone Encounter (Signed)
Pt c/o increasing SOB, saw pcp last week and told gabapentin may be causing SOB, told she has COPD by ED visit, and sent home. HHN states she gained 3 lbs in 7 days. Told HHN  over last 6 months her abdomen is growing larger.patient does not have pulmonologist

## 2018-04-27 NOTE — Telephone Encounter (Signed)
Can we get ER records. Her lasix is written as 40mg  daily, may take additional 40mg  as needed. How has she been taking her lasix, has she taken any extra doses?   J Zanae Kuehnle MD

## 2018-04-27 NOTE — Telephone Encounter (Signed)
Patient has been taking extra Lasix 40 mg daily in addition to her regular 40 mg dose.    I requested records from [facility]

## 2018-04-28 NOTE — Telephone Encounter (Signed)
Take 60mg  bid today, Thursday, Friday. Update [place] on weights and symptoms late Friday   J Darshana Curnutt MD

## 2018-04-29 NOTE — Telephone Encounter (Signed)
I spoke with patient, she will take lasix 60 mg BID for 3 days and call back with weight and progress update

## 2018-04-29 NOTE — Telephone Encounter (Signed)
Pt's line rings busy x 2

## 2018-05-05 ENCOUNTER — Encounter: Payer: Self-pay | Admitting: *Deleted

## 2018-05-05 NOTE — Telephone Encounter (Signed)
Take lasix 80mg  in AM and 60mg  in PM x 3 days including today, then update [place] Friday afternoon   Zandra Abts MD

## 2018-05-05 NOTE — Telephone Encounter (Signed)
Please give pt a call concerning progress update

## 2018-05-05 NOTE — Telephone Encounter (Signed)
Returned pt call. She complains that she is still gaining weight. She is having more SOB, but denies chest pain, dizziness. She is having some swelling in her lower legs and feet. She stated she is on a low sodium diet. She has increased her water intake to 2 16 oz bottles. She has been taking lasix 40 mg BID since her June visit. She stated that the increased lasix did not make her urinate any more. Please advise.

## 2018-05-05 NOTE — Telephone Encounter (Signed)
This encounter was created in error - please disregard.

## 2018-05-05 NOTE — Telephone Encounter (Signed)
----- Message from Orinda Kenner sent at 05/05/2018  2:40 PM EDT ----- Regarding: RE: appt. asap Sheliah Mends!! Thank you so much! I knew you would help me out!   Coralyn Mark ----- Message ----- From: Cristopher Estimable, RN Sent: 05/05/2018   2:24 PM EDT To: Orinda Kenner Subject: RE: appt. asap                                 I will give her a call and figure out what to do. thanks ----- Message ----- From: Orinda Kenner Sent: 05/05/2018   1:49 PM EDT To: Cristopher Estimable, RN Subject: RE: appt. asap                                 Yeah that's what I saw too.  My message said to be see sooner due to "weight gain".  I didn't know if there was like "held" appointments or anything. I was just fishing because I didn't know what else to do. Thanks Hilda Blades. :)  ----- Message ----- From: Cristopher Estimable, RN Sent: 05/05/2018   1:46 PM EDT To: Orinda Kenner Subject: RE: appt. asap                                 Yes I can, now the first opening I have in [place] is not until the end of October. Is she having problems? ----- Message ----- From: Orinda Kenner Sent: 05/05/2018   1:29 PM EDT To: Cristopher Estimable, RN Subject: FW: appt. asap                                 [facility],  [place] all is well with you. This patient is wanting to be seen in [place] office and I seem to be getting the run around on how to schedule her. Could you help me with this please?  Thanks, Coralyn Mark ----- Message ----- From: Isaiah Blakes Sent: 05/05/2018  11:43 AM EDT To: Orinda Kenner Subject: RE: appt. asap                                 I'm not sure, I'm in the [place] office.   Is the pt wanting to be seen here?  ----- Message ----- From: Orinda Kenner Sent: 05/05/2018  11:32 AM EDT To: Isaiah Blakes Subject: FW: appt. asap                                 Good Morning Vincente Liberty,  This is Coralyn Mark in the [employer]. I have searched for an appointment for this lady but the only thing I can find is  10/30. Do you know if there may be any other availability?  Thanks, Coralyn Mark ----- Message ----- From: Drema Dallas, CMA Sent: 05/05/2018   9:33 AM EDT To: Manson Passey Subject: appt. asap  Pt states she is suppose to go to [place] for follow up - as she lives closer to there. She can never get anyone to answer the phone or return her call. She needs to be seen for weight gain.   Thanks,  Lattie Haw

## 2018-05-05 NOTE — Telephone Encounter (Signed)
Called pt. Informed her of increase in medication. Voiced understanding. Will call Friday to update.

## 2018-05-06 ENCOUNTER — Telehealth: Payer: Self-pay | Admitting: *Deleted

## 2018-05-06 NOTE — Telephone Encounter (Signed)
Spoke with pt, she had an episode about 2 weeks ago with trouble breathing. She went to the [facility] ER and was sent home with no changes in treatment. Since that time her furosemide was increased by dr branch. She has not really noticed any change and feels the breathing issues are due to her COPD. She is wanting to change to the [place] office and follow up was scheduled next available with dr Stanford Breed. Patient voiced understanding of location of the office and the number to call to reach someone if she needs something prior to the appointment.

## 2018-06-24 NOTE — Progress Notes (Deleted)
HPI: FU CHF and CAD; previously followed by Dr Harl Bowie. Echo 4/16 showed normal LV function; moderate RVE, mild RAE, left pleural effusion. Cath 2016 showed 40 RCA, EF 55. Nuclear study 10/18 showed EF 63, soft tissue attenuation; no ischemia. Carotid dopplers 10/18 showed 70 L and <50 R; followed by vascular surgery. Also with COPD. Since last seen,   Current Outpatient Medications  Medication Sig Dispense Refill  . albuterol (PROVENTIL HFA;VENTOLIN HFA) 108 (90 BASE) MCG/ACT inhaler Inhale 2 puffs into the lungs every 4 (four) hours as needed for wheezing or shortness of breath. 1 Inhaler 12  . aspirin 81 MG chewable tablet Chew 1 tablet (81 mg total) by mouth daily. 30 tablet 1  . atorvastatin (LIPITOR) 80 MG tablet Take 1 tablet (80 mg total) by mouth daily at 6 PM. 30 tablet 3  . budesonide-formoterol (SYMBICORT) 80-4.5 MCG/ACT inhaler Inhale 2 puffs into the lungs 2 (two) times daily. 1 Inhaler 12  . furosemide (LASIX) 40 MG tablet Take 40 mg Daily. May take extra 40 mg for weight gain above 117 lbs. 90 tablet 3  . gabapentin (NEURONTIN) 300 MG capsule Take 3 capsules (900 mg total) by mouth 3 (three) times daily. 270 capsule 6  . HYDROcodone-acetaminophen (NORCO) 10-325 MG per tablet Take 1 tablet by mouth every 6 (six) hours as needed for moderate pain or severe pain. 30 tablet 0  . ipratropium-albuterol (DUONEB) 0.5-2.5 (3) MG/3ML SOLN INHALE CONTENTS OF 1 VIAL VIA NEBULIZER EVERY 6 HOURS  5  . pantoprazole (PROTONIX) 40 MG tablet Take 1 tablet (40 mg total) by mouth daily. 30 tablet 1  . potassium chloride (K-DUR) 10 MEQ tablet TK 2 TS PO D  3  . predniSONE (DELTASONE) 5 MG tablet Take 1 tablet (5 mg total) by mouth daily with breakfast. 30 tablet 1  . saccharomyces boulardii (FLORASTOR) 250 MG capsule Take 250 mg by mouth 2 (two) times daily.     No current facility-administered medications for this visit.      Past Medical History:  Diagnosis Date  . Anxiety   . Asthma     . Carotid artery disease ([place])    a. duplex 01/1949: RICA <93%, LICA 26-71%. Followed by [facility].  . CHF (congestive heart failure) ([place])   . Constipation   . COPD (chronic obstructive pulmonary disease) ([facility])    Emphysema radiographically  . Depression with anxiety   . Diverticulosis   . DVT (deep venous thrombosis) ([place])   . Fibromyalgia   . First degree AV block   . Full dentures   . Migraine   . Myocardial infarction ([place])   . On home O2   . Protein calorie malnutrition ([place])   . Pulmonary nodules 08/11/2013   a. CT angio 08/2013: 4-32mm nodules in RUL/RML, f/u recommended 6 months.  . Stroke ([place])   . TIA (transient ischemic attack)     Past Surgical History:  Procedure Laterality Date  . ABDOMINAL HYSTERECTOMY  1971  . APPENDECTOMY    . BACK SURGERY     X3  . BIOPSY  03/06/2014   Procedure: BIOPSY;  Surgeon: Danie Binder, MD;  [facility];  Service: Endoscopy;;  . COLONOSCOPY  June 2002   Dr. Ferdinand Lango: severe diverticular disease with haustral hypertrophy, primary and secondary diverticulosis, persistent spasticity, early stenosis  . COLONOSCOPY WITH PROPOFOL N/A 06/21/2013   Dr. Oneida Alar: sigmoid colon and descending colon with diverticula, small internal hemorrhoids  . ESOPHAGOGASTRODUODENOSCOPY N/A  03/06/2014   Procedure: ESOPHAGOGASTRODUODENOSCOPY (EGD);  Surgeon: Danie Binder, MD;  [facility];  Service: Endoscopy;  Laterality: N/A;  9;30  . EYE SURGERY     both cataracts-lenses  . LEFT HEART CATHETERIZATION WITH CORONARY ANGIOGRAM N/A 12/21/2014   Procedure: LEFT HEART CATHETERIZATION WITH CORONARY ANGIOGRAM;  Surgeon: Belva Crome, MD;  Location: [facility] CATH LAB;  Service: Cardiovascular;  Laterality: N/A;  . NASAL SINUS SURGERY Right 07/11/2013   Procedure: RIGHT ENDOSCOPIC ANTERIOR ETHMOIDECTOMY/RIGHT ENDOSCOPIC MAXILLARY ANTROSTOMY WITH REMOVAL OF TISSUE;  Surgeon: Ascencion Dike, MD;  Location: [facility];  Service: ENT;  Laterality:  Right;  . OTHER SURGICAL HISTORY     scar tissue removal x2  . SAVORY DILATION N/A 03/06/2014   Procedure: SAVORY DILATION;  Surgeon: Danie Binder, MD;  [facility];  Service: Endoscopy;  Laterality: N/A;  . TONSILLECTOMY      Social History   Socioeconomic History  . Marital status: Married    Spouse name: Jenny Reichmann  . Number of children: 4  . Years of education: 11th  . Highest education level: Not on file  Occupational History  . Occupation: Retired   Scientific laboratory technician  . Financial resource strain: Not on file  . Food insecurity:    Worry: Not on file    Inability: Not on file  . Transportation needs:    Medical: Not on file    Non-medical: Not on file  Tobacco Use  . Smoking status: Former Smoker    Packs/day: 1.00    Years: 50.00    Pack years: 50.00    Types: Cigarettes    Last attempt to quit: 12/15/2014    Years since quitting: 3.5  . Smokeless tobacco: Never Used  . Tobacco comment: smoked X 60 years  Substance and Sexual Activity  . Alcohol use: No    Alcohol/week: 0.0 standard drinks  . Drug use: No  . Sexual activity: Yes    Birth control/protection: Surgical  Lifestyle  . Physical activity:    Days per week: Not on file    Minutes per session: Not on file  . Stress: Not on file  Relationships  . Social connections:    Talks on phone: Not on file    Gets together: Not on file    Attends religious service: Not on file    Active member of club or organization: Not on file    Attends meetings of clubs or organizations: Not on file    Relationship status: Not on file  . Intimate partner violence:    Fear of current or ex partner: Not on file    Emotionally abused: Not on file    Physically abused: Not on file    Forced sexual activity: Not on file  Other Topics Concern  . Not on file  Social History Narrative   Lives at home with husband Jenny Reichmann.    Caffeine use: 3-4 cups coffee/day    Family History  Problem Relation Age of Onset  . Breast  cancer Mother   . Heart disease Mother   . Hypertension Mother   . Heart attack Mother   . Other Mother        varicose veins  . Cancer Mother   . Varicose Veins Mother   . Throat cancer Father   . Cancer Father   . Diabetes Brother   . Hypertension Brother   . Heart disease Brother   . Colon cancer Neg  Hx     ROS: no fevers or chills, productive cough, hemoptysis, dysphasia, odynophagia, melena, hematochezia, dysuria, hematuria, rash, seizure activity, orthopnea, PND, pedal edema, claudication. Remaining systems are negative.  Physical Exam: Well-developed well-nourished in no acute distress.  Skin is warm and dry.  HEENT is normal.  Neck is supple.  Chest is clear to auscultation with normal expansion.  Cardiovascular exam is regular rate and rhythm.  Abdominal exam nontender or distended. No masses palpated. Extremities show no edema. neuro grossly intact  ECG- personally reviewed  A/P  1 chronic diastolic congestive heart failure-patient appears to be euvolemic on examination.  Continue present dose of diuretics.  We discussed the importance of fluid restriction and low-sodium diet.  Check potassium and renal function.  Note approximately 20 minutes spent reviewing records prior to patient arrival.  2 coronary artery disease-patient is not having chest pain.  Continue medical therapy.  Continue aspirin and statin.  3 carotid artery disease-continue aspirin and statin.  Followed by vascular surgery.  4 hyperlipidemia-continue statin.  Check lipids and liver.  5 COPD-Per primary care.  Kirk Ruths, MD

## 2018-06-30 ENCOUNTER — Ambulatory Visit: Payer: Medicare Other | Admitting: Cardiology

## 2018-08-30 ENCOUNTER — Encounter: Payer: Self-pay | Admitting: Emergency Medicine

## 2018-08-30 ENCOUNTER — Other Ambulatory Visit: Payer: Self-pay

## 2018-08-30 ENCOUNTER — Emergency Department
Admission: EM | Admit: 2018-08-30 | Discharge: 2018-08-30 | Disposition: A | Discharge status: Home / Self Care | Payer: Medicare Other | Source: Home / Self Care | Attending: Emergency Medicine | Admitting: Emergency Medicine

## 2018-08-30 DIAGNOSIS — R0602 Shortness of breath: Secondary | ICD-10-CM | POA: Diagnosis not present

## 2018-08-30 NOTE — ED Notes (Signed)
O2 administered via [place] at 3.5L per minute for 45 minutes. Recheck Pulse Ox at D/C 97% on 3.5L.

## 2018-08-30 NOTE — ED Provider Notes (Signed)
[facility]    CSN: [id number] Arrival date & time: 08/30/18  1558     History   Chief Complaint Chief Complaint  Patient presents with  . Shortness of Breath    HPI Lindsay Mitchell is a 77 y.o. female.   HPI  Lindsay Mitchell uses home oxygen at 3-1/2 L/min for chronic maintenance of severe COPD. Patient thought she had an appointment with cardiologist office in this building for routine appointment today.  However, when her friend took her to our building today, they found that cardiologist office was closed, and patient had not brought her home oxygen.  After a few minutes of walking into the building, she became short of breath without chest pain. We were notified of this and took patient via wheelchair across the hall into our urgent care for evaluation.  On room air, patient's oxygen saturation was initially 80 We then put her on oxygen 3-1/2 L/min, and she felt better and shortness of breath resolved and we rechecked oxygen saturation 97%.   Past Medical History:  Diagnosis Date  . Anxiety   . Asthma   . Carotid artery disease ([place])    a. duplex 05/5283: RICA <13%, LICA 24-40%. Followed by [facility].  . CHF (congestive heart failure) ([place])   . Constipation   . COPD (chronic obstructive pulmonary disease) ([facility])    Emphysema radiographically  . Depression with anxiety   . Diverticulosis   . DVT (deep venous thrombosis) ([place])   . Fibromyalgia   . First degree AV block   . Full dentures   . Migraine   . Myocardial infarction ([place])   . On home O2   . Protein calorie malnutrition ([place])   . Pulmonary nodules 08/11/2013   a. CT angio 08/2013: 4-66mm nodules in RUL/RML, f/u recommended 6 months.  . Stroke ([place])   . TIA (transient ischemic attack)     Patient Active Problem List   Diagnosis Date Noted  . Intestinal diverticular abscess   . Adrenal insufficiency ([place]) 03/05/2015  . Orthostatic hypotension 03/04/2015  . Chronic respiratory failure with hypoxia  ([place]) 03/02/2015  . Sepsis ([place]) 03/01/2015  . Diverticulitis of intestine with abscess 02/28/2015  . Fibromyalgia 02/28/2015  . Leg edema 02/28/2015  . HLD (hyperlipidemia) 02/28/2015  . Acute on chronic respiratory failure ([place]) 02/14/2015  . Diastolic CHF, chronic ([place]) 01/10/2015  . COPD, severe ([place])   . COPD exacerbation ([place])   . Essential hypertension   . Other chest pain   . Acute renal failure syndrome ([place])   . Diverticulitis of colon   . C. difficile colitis   . Severe protein-calorie malnutrition ([place])   . Chronic pain syndrome   . Shock circulatory ([place]) 12/20/2014  . NSTEMI (non-ST elevated myocardial infarction) ([place]) 12/20/2014  . Hypomagnesemia 12/20/2014  . Diverticulitis of large intestine with abscess without bleeding   . Arterial hypotension   . Hypotension 12/19/2014  . Anemia 12/19/2014  . Elevated troponin 12/19/2014  . Hypoxia 12/19/2014  . Chest pain 12/18/2014  . Pleural effusion 12/18/2014  . Protein-calorie malnutrition, severe ([place]) 12/17/2014  . Colonic diverticular abscess 12/15/2014  . Vision loss of left eye 11/05/2014  . Trigeminal neuralgia of left side of face 11/05/2014  . 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 Paresthesias 10/31/2014  . Trigeminal neuralgia 10/31/2014  . Vision loss, left eye 10/31/2014  . Intermittent exotropia, monocular 10/31/2014  . Dyspepsia 02/15/2014  . Dysphagia, unspecified(787.20) 02/15/2014  . Pulmonary nodules 08/11/2013  . Emphysema  08/11/2013  . Tobacco abuse 08/11/2013  . Depression with anxiety   . Rectal bleeding 06/01/2013  . Chronic back pain 04/12/2013  . COPD (chronic obstructive pulmonary disease) ([place]) 04/12/2013  . Adjustment reaction 04/12/2013  . Carotid stenosis 12/10/2012  . Constipation 10/28/2012  . Loss of weight 10/28/2012    Past Surgical History:  Procedure Laterality Date  . ABDOMINAL HYSTERECTOMY  1971  . APPENDECTOMY    . BACK SURGERY     X3  . BIOPSY  03/06/2014    Procedure: BIOPSY;  Surgeon: Danie Binder, MD;  [facility];  Service: Endoscopy;;  . COLONOSCOPY  June 2002   Dr. Ferdinand Lango: severe diverticular disease with haustral hypertrophy, primary and secondary diverticulosis, persistent spasticity, early stenosis  . COLONOSCOPY WITH PROPOFOL N/A 06/21/2013   Dr. Oneida Alar: sigmoid colon and descending colon with diverticula, small internal hemorrhoids  . ESOPHAGOGASTRODUODENOSCOPY N/A 03/06/2014   Procedure: ESOPHAGOGASTRODUODENOSCOPY (EGD);  Surgeon: Danie Binder, MD;  Location[facility];  Service: Endoscopy;  Laterality: N/A;  9;30  . EYE SURGERY     both cataracts-lenses  . LEFT HEART CATHETERIZATION WITH CORONARY ANGIOGRAM N/A 12/21/2014   Procedure: LEFT HEART CATHETERIZATION WITH CORONARY ANGIOGRAM;  Surgeon: Belva Crome, MD;  Location: [facility] CATH LAB;  Service: Cardiovascular;  Laterality: N/A;  . NASAL SINUS SURGERY Right 07/11/2013   Procedure: RIGHT ENDOSCOPIC ANTERIOR ETHMOIDECTOMY/RIGHT ENDOSCOPIC MAXILLARY ANTROSTOMY WITH REMOVAL OF TISSUE;  Surgeon: Ascencion Dike, MD;  Location: [facility];  Service: ENT;  Laterality: Right;  . OTHER SURGICAL HISTORY     scar tissue removal x2  . SAVORY DILATION N/A 03/06/2014   Procedure: SAVORY DILATION;  Surgeon: Danie Binder, MD;  [facility];  Service: Endoscopy;  Laterality: N/A;  . TONSILLECTOMY      OB History    Gravida      Para      Term      Preterm      AB      Living  4     SAB      TAB      Ectopic      Multiple      Live Births               Home Medications    Prior to Admission medications   Medication Sig Start Date End Date Taking? Authorizing Provider  albuterol (PROVENTIL HFA;VENTOLIN HFA) 108 (90 BASE) MCG/ACT inhaler Inhale 2 puffs into the lungs every 4 (four) hours as needed for wheezing or shortness of breath. 08/12/13   Rexene Alberts, MD  aspirin 81 MG chewable tablet Chew 1 tablet (81 mg total) by mouth  daily. 12/28/14   Reyne Dumas, MD  atorvastatin (LIPITOR) 80 MG tablet Take 1 tablet (80 mg total) by mouth daily at 6 PM. 01/10/15   Imogene Burn, PA-C  budesonide-formoterol (SYMBICORT) 80-4.5 MCG/ACT inhaler Inhale 2 puffs into the lungs 2 (two) times daily. 12/28/14   Reyne Dumas, MD  furosemide (LASIX) 40 MG tablet Take 40 mg Daily. May take extra 40 mg for weight gain above 117 lbs. 02/11/18   Arnoldo Lenis, MD  gabapentin (NEURONTIN) 300 MG capsule Take 3 capsules (900 mg total) by mouth 3 (three) times daily. 11/09/14   Melvenia Beam, MD  HYDROcodone-acetaminophen (NORCO) 10-325 MG per tablet Take 1 tablet by mouth every 6 (six) hours as needed for moderate pain or severe pain. 03/08/15   Marin Comment,  Collier Salina, MD  ipratropium-albuterol (DUONEB) 0.5-2.5 (3) MG/3ML SOLN INHALE CONTENTS OF 1 VIAL VIA NEBULIZER EVERY 6 HOURS 12/29/14   [provider]  pantoprazole (PROTONIX) 40 MG tablet Take 1 tablet (40 mg total) by mouth daily. 03/08/15   Orvan Falconer, MD  potassium chloride (K-DUR) 10 MEQ tablet TK 2 TS PO D 01/11/15   [provider]  predniSONE (DELTASONE) 5 MG tablet Take 1 tablet (5 mg total) by mouth daily with breakfast. 03/08/15   Orvan Falconer, MD  saccharomyces boulardii (FLORASTOR) 250 MG capsule Take 250 mg by mouth 2 (two) times daily.    [provider]    Family History Family History  Problem Relation Age of Onset  . Breast cancer Mother   . Heart disease Mother   . Hypertension Mother   . Heart attack Mother   . Other Mother        varicose veins  . Cancer Mother   . Varicose Veins Mother   . Throat cancer Father   . Cancer Father   . Diabetes Brother   . Hypertension Brother   . Heart disease Brother   . Colon cancer Neg Hx    Reviewed the above family history Social History Social History   Tobacco Use  . Smoking status: Former Smoker    Packs/day: 1.00    Years: 50.00    Pack years: 50.00    Types: Cigarettes    Last attempt to quit:  12/15/2014    Years since quitting: 3.7  . Smokeless tobacco: Never Used  . Tobacco comment: smoked X 60 years  Substance Use Topics  . Alcohol use: No    Alcohol/week: 0.0 standard drinks  . Drug use: No   I reviewed the above social history.  Quit smoking 3-1/2 years ago.  Allergies   Tramadol; Codeine; Oxycodone; Talwin [pentazocine]; and Valium [diazepam]   Review of Systems Review of Systems  Pertinent items noted in HPI and remainder of comprehensive ROS otherwise negative for any new symptoms.  Physical Exam Triage Vital Signs ED Triage Vitals  Enc Vitals Group     BP      Pulse      Resp      Temp      Temp src      SpO2      Weight      Height      Head Circumference      Peak Flow      Pain Score      Pain Loc      Pain Edu?      Excl. in [place]?    No data found.  Updated Vital Signs There were no vitals taken for this visit. Pulse ox 80 on room air initially  Physical Exam Vitals signs reviewed.  Constitutional:      General: She is not in acute distress.    Appearance: She is well-developed.  HENT:     Head: Normocephalic and atraumatic.  Eyes:     General: No scleral icterus.    Pupils: Pupils are equal, round, and reactive to light.  Neck:     Musculoskeletal: Normal range of motion and neck supple.  Cardiovascular:     Rate and Rhythm: Normal rate and regular rhythm.  Pulmonary:     Initially, she was dyspneic, but after O2 3-1/2 L/min: Pulmonary effort is normal.  Lungs equal expansion bilaterally.  No wheezes or rales or rhonchi. Abdominal:     General:  There is no distension.  Skin:    General: Skin is warm and dry.  Neurological:     Mental Status: She is alert and oriented to person, place, and time.     Cranial Nerves: No cranial nerve deficit.  Psychiatric:        Behavior: Behavior normal.    UC Treatments / Results  Labs (all labs ordered are listed, but only abnormal results are displayed) Labs Reviewed - No data to  display  EKG None  Radiology No results found.  Procedures Procedures (including critical care time)  Medications Ordered in UC Medications - No data to display  Initial Impression / Assessment and Plan / UC Course  I have reviewed the triage vital signs and the nursing notes.  Pertinent labs & imaging results that were available during my care of the patient were reviewed by me and considered in my medical decision making (see chart for details).      Final Clinical Impressions(s) / UC Diagnoses   Final diagnoses:  Shortness of breath   Dyspnea caused by her not having her home O2 for chronic lung disease, see details and discussion and history. Here in urgent care, O2 provided, see details above, and she felt much better and felt back to her baseline.  Our nurses were able to wheel patient in a wheelchair to her friend's car awaiting in front of our clinic, to drive patient straight home for to resume her home O2 PRN.  No other evaluation or treatment indicated here in urgent care today. Follow-up with PCP and specialists within 1 week. Red flags discussed and advised what to do if any red flags. Her friend and she voiced understanding and agreement with plans. Over 45 minutes spent, greater than 50% of the time spent for counseling and coordination of care.    Jacqulyn Cane, MD 08/31/18 1400

## 2018-08-30 NOTE — ED Triage Notes (Addendum)
Pt came into the cardiology clinic in the [facility] today because she thought she had an appt. She reports that she uses O2 at home and did not bring a tank with her because they were suppose to have one here that she could use while in the clinic. She uses 3.5 L O2 at home. She is here for O2 therapy to get her O2 level back up for her to make the ride back home.  I attempted to reschedule an appt with Dr Stanford Breed for this Thursday. pt declined to schedule an appt. She states that she will find another cardiologist closer to home.

## 2018-09-02 ENCOUNTER — Ambulatory Visit: Payer: Medicare Other | Admitting: Cardiology

## 2019-09-10 ENCOUNTER — Other Ambulatory Visit: Payer: Self-pay

## 2019-09-10 ENCOUNTER — Emergency Department (HOSPITAL_COMMUNITY)
Admission: EM | Admit: 2019-09-10 | Discharge: 2019-09-10 | Discharge status: Absent without leave | Payer: Medicare Other

## 2020-12-28 ENCOUNTER — Emergency Department (HOSPITAL_COMMUNITY): Payer: Medicare Other

## 2020-12-28 ENCOUNTER — Other Ambulatory Visit: Payer: Self-pay

## 2020-12-28 ENCOUNTER — Emergency Department (HOSPITAL_COMMUNITY)
Admission: EM | Admit: 2020-12-28 | Discharge: 2020-12-29 | Disposition: A | Discharge status: Home / Self Care | Payer: Medicare Other | Attending: Emergency Medicine | Admitting: Emergency Medicine

## 2020-12-28 ENCOUNTER — Encounter (HOSPITAL_COMMUNITY): Payer: Self-pay

## 2020-12-28 DIAGNOSIS — I110 Hypertensive heart disease with heart failure: Secondary | ICD-10-CM | POA: Diagnosis not present

## 2020-12-28 DIAGNOSIS — Z79899 Other long term (current) drug therapy: Secondary | ICD-10-CM | POA: Insufficient documentation

## 2020-12-28 DIAGNOSIS — Z87891 Personal history of nicotine dependence: Secondary | ICD-10-CM | POA: Insufficient documentation

## 2020-12-28 DIAGNOSIS — N823 Fistula of vagina to large intestine: Secondary | ICD-10-CM | POA: Insufficient documentation

## 2020-12-28 DIAGNOSIS — K625 Hemorrhage of anus and rectum: Secondary | ICD-10-CM | POA: Insufficient documentation

## 2020-12-28 DIAGNOSIS — N824 Other female intestinal-genital tract fistulae: Secondary | ICD-10-CM

## 2020-12-28 DIAGNOSIS — Z7951 Long term (current) use of inhaled steroids: Secondary | ICD-10-CM | POA: Diagnosis not present

## 2020-12-28 DIAGNOSIS — J441 Chronic obstructive pulmonary disease with (acute) exacerbation: Secondary | ICD-10-CM | POA: Insufficient documentation

## 2020-12-28 DIAGNOSIS — I5030 Unspecified diastolic (congestive) heart failure: Secondary | ICD-10-CM | POA: Diagnosis not present

## 2020-12-28 DIAGNOSIS — Z955 Presence of coronary angioplasty implant and graft: Secondary | ICD-10-CM | POA: Insufficient documentation

## 2020-12-28 DIAGNOSIS — Z7982 Long term (current) use of aspirin: Secondary | ICD-10-CM | POA: Insufficient documentation

## 2020-12-28 DIAGNOSIS — K6289 Other specified diseases of anus and rectum: Secondary | ICD-10-CM | POA: Diagnosis present

## 2020-12-28 DIAGNOSIS — J45909 Unspecified asthma, uncomplicated: Secondary | ICD-10-CM | POA: Diagnosis not present

## 2020-12-28 LAB — CBC WITH DIFFERENTIAL/PLATELET
Abs Immature Granulocytes: 0.07 10*3/uL (ref 0.00–0.07)
Basophils Absolute: 0.1 10*3/uL (ref 0.0–0.1)
Basophils Relative: 0 %
Eosinophils Absolute: 0.2 10*3/uL (ref 0.0–0.5)
Eosinophils Relative: 1 %
HCT: 35 % — ABNORMAL LOW (ref 36.0–46.0)
Hemoglobin: 10.7 g/dL — ABNORMAL LOW (ref 12.0–15.0)
Immature Granulocytes: 1 %
Lymphocytes Relative: 10 %
Lymphs Abs: 1.4 10*3/uL (ref 0.7–4.0)
MCH: 29.6 pg (ref 26.0–34.0)
MCHC: 30.6 g/dL (ref 30.0–36.0)
MCV: 97 fL (ref 80.0–100.0)
Monocytes Absolute: 1.7 10*3/uL — ABNORMAL HIGH (ref 0.1–1.0)
Monocytes Relative: 12 %
Neutro Abs: 10.9 10*3/uL — ABNORMAL HIGH (ref 1.7–7.7)
Neutrophils Relative %: 76 %
Platelets: 237 10*3/uL (ref 150–400)
RBC: 3.61 MIL/uL — ABNORMAL LOW (ref 3.87–5.11)
RDW: 14.4 % (ref 11.5–15.5)
WBC: 14.3 10*3/uL — ABNORMAL HIGH (ref 4.0–10.5)
nRBC: 0 % (ref 0.0–0.2)

## 2020-12-28 LAB — COMPREHENSIVE METABOLIC PANEL
ALT: 8 U/L (ref 0–44)
AST: 16 U/L (ref 15–41)
Albumin: 2.8 g/dL — ABNORMAL LOW (ref 3.5–5.0)
Alkaline Phosphatase: 74 U/L (ref 38–126)
Anion gap: 7 (ref 5–15)
BUN: 21 mg/dL (ref 8–23)
CO2: 32 mmol/L (ref 22–32)
Calcium: 8.8 mg/dL — ABNORMAL LOW (ref 8.9–10.3)
Chloride: 99 mmol/L (ref 98–111)
Creatinine, Ser: 1.11 mg/dL — ABNORMAL HIGH (ref 0.44–1.00)
GFR, Estimated: 51 mL/min — ABNORMAL LOW (ref >60)
Glucose, Bld: 113 mg/dL — ABNORMAL HIGH (ref 70–99)
Potassium: 4.3 mmol/L (ref 3.5–5.1)
Sodium: 138 mmol/L (ref 135–145)
Total Bilirubin: 0.7 mg/dL (ref 0.3–1.2)
Total Protein: 5.6 g/dL — ABNORMAL LOW (ref 6.5–8.1)

## 2020-12-28 LAB — LIPASE, BLOOD: Lipase: 29 U/L (ref 11–51)

## 2020-12-28 LAB — POC OCCULT BLOOD, ED: Fecal Occult Bld: NEGATIVE

## 2020-12-28 LAB — TYPE AND SCREEN
ABO/RH(D): A POS
Antibody Screen: NEGATIVE

## 2020-12-28 MED ORDER — MORPHINE SULFATE (PF) 4 MG/ML IV SOLN
4.0000 mg | Freq: Once | INTRAVENOUS | Status: AC
  Administered 2020-12-28: 4 mg via INTRAVENOUS
  Filled 2020-12-28: qty 1

## 2020-12-28 MED ORDER — IOHEXOL 300 MG/ML  SOLN
100.0000 mL | Freq: Once | INTRAMUSCULAR | Status: AC | PRN
  Administered 2020-12-28: 100 mL via INTRAVENOUS

## 2020-12-28 MED ORDER — ONDANSETRON HCL 4 MG/2ML IJ SOLN
4.0000 mg | Freq: Once | INTRAMUSCULAR | Status: AC
  Administered 2020-12-28: 4 mg via INTRAVENOUS
  Filled 2020-12-28: qty 2

## 2020-12-29 NOTE — Discharge Instructions (Addendum)
Call your primary care doctor or specialist as discussed in the next 2-3 days.   Return immediately back to the ER if:  Your symptoms worsen within the next 12-24 hours. You develop new symptoms such as new fevers, persistent vomiting, new pain, shortness of breath, or new weakness or numbness, or if you have any other concerns.  

## 2020-12-29 NOTE — ED Provider Notes (Signed)
[facility] EMERGENCY DEPARTMENT Provider Note   CSN: [id number] Arrival date & time: 12/28/20  1936     History Chief Complaint  Patient presents with  . Rectal Bleeding    Patient reported having vaginal and rectal bleeding that has been ongoing for the past month. Pt reports having symptoms of feeling faint and weak. Patient states that bleeding from vaginal and rectal site is a red colored mixed with excretions. Patient states that there is accompanying abdominal pain that occurs below the navel that is dull and throbbing.     Lindsay Mitchell is a 80 y.o. female.  Patient presents ER chief complaint of lower abdominal pain and history of colovaginal fistula with bleeding.  She states that she has a known history of colovaginal fistula for several years now.  Is [place] city but recently moved to this area in the past month and Establish care.  She states that over the course of the past month she had intermittent bleeding from both her vagina and her rectum and with stool coming out of her vagina due to the colovaginal fistula.  She denies fevers vomiting cough or diarrhea.        Past Medical History:  Diagnosis Date  . Anxiety   . Asthma   . Carotid artery disease ([place])    a. duplex [id number]: RICA [id number], LICA 0000000. Followed by [facility].  . CHF (congestive heart failure) ([place])   . Constipation   . COPD (chronic obstructive pulmonary disease) ([facility])    Emphysema radiographically  . Depression with anxiety   . Diverticulosis   . DVT (deep venous thrombosis) ([place])   . Fibromyalgia   . First degree AV block   . Full dentures   . Migraine   . Myocardial infarction ([place])   . On home O2   . Protein calorie malnutrition ([place])   . Pulmonary nodules 08/11/2013   a. CT angio 08/2013: 4-65mm nodules in RUL/RML, f/u recommended 6 months.  . Stroke ([place])   . TIA (transient ischemic attack)     Patient Active Problem List   Diagnosis Date Noted  .  Intestinal diverticular abscess   . Adrenal insufficiency ([place]) 03/05/2015  . Orthostatic hypotension 03/04/2015  . Chronic respiratory failure with hypoxia ([place]) 03/02/2015  . Sepsis ([place]) 03/01/2015  . Diverticulitis of intestine with abscess 02/28/2015  . Fibromyalgia 02/28/2015  . Leg edema 02/28/2015  . HLD (hyperlipidemia) 02/28/2015  . Acute on chronic respiratory failure ([place]) 02/14/2015  . Diastolic CHF, chronic ([place]) 01/10/2015  . COPD, severe ([place])   . COPD exacerbation ([place])   . Essential hypertension   . Other chest pain   . Acute renal failure syndrome ([place])   . Diverticulitis of colon   . C. difficile colitis   . Severe protein-calorie malnutrition ([place])   . Chronic pain syndrome   . Shock circulatory ([place]) 12/20/2014  . NSTEMI (non-ST elevated myocardial infarction) ([place]) 12/20/2014  . Hypomagnesemia 12/20/2014  . Diverticulitis of large intestine with abscess without bleeding   . Arterial hypotension   . Hypotension 12/19/2014  . Anemia 12/19/2014  . Elevated troponin 12/19/2014  . Hypoxia 12/19/2014  . Chest pain 12/18/2014  . Pleural effusion 12/18/2014  . Protein-calorie malnutrition, severe ([place]) 12/17/2014  . Colonic diverticular abscess 12/15/2014  . Vision loss of left eye 11/05/2014  . Trigeminal neuralgia of left side of face 11/05/2014  . 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 Paresthesias 10/31/2014  . Trigeminal neuralgia 10/31/2014  .  Vision loss, left eye 10/31/2014  . Intermittent exotropia, monocular 10/31/2014  . Dyspepsia 02/15/2014  . Dysphagia, unspecified(787.20) 02/15/2014  . Pulmonary nodules 08/11/2013  . 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 Tobacco abuse 08/11/2013  . Depression with anxiety   . Rectal bleeding 06/01/2013  . Chronic back pain 04/12/2013  . COPD (chronic obstructive pulmonary disease) ([place]) 04/12/2013  . Adjustment reaction 04/12/2013  . Carotid stenosis 12/10/2012  . Constipation 10/28/2012  . Loss of weight 10/28/2012     Past Surgical History:  Procedure Laterality Date  . ABDOMINAL HYSTERECTOMY  1971  . APPENDECTOMY    . BACK SURGERY     X3  . BIOPSY  03/06/2014   Procedure: BIOPSY;  Surgeon: Danie Binder, MD;  Location[facility];  Service: Endoscopy;;  . COLONOSCOPY  June 2002   Dr. Ferdinand Lango: severe diverticular disease with haustral hypertrophy, primary and secondary diverticulosis, persistent spasticity, early stenosis  . COLONOSCOPY WITH PROPOFOL N/A 06/21/2013   Dr. Oneida Alar: sigmoid colon and descending colon with diverticula, small internal hemorrhoids  . ESOPHAGOGASTRODUODENOSCOPY N/A 03/06/2014   Procedure: ESOPHAGOGASTRODUODENOSCOPY (EGD);  Surgeon: Danie Binder, MD;  Location: [facility];  Service: Endoscopy;  Laterality: N/A;  9;30  . EYE SURGERY     both cataracts-lenses  . LEFT HEART CATHETERIZATION WITH CORONARY ANGIOGRAM N/A 12/21/2014   Procedure: LEFT HEART CATHETERIZATION WITH CORONARY ANGIOGRAM;  Surgeon: Belva Crome, MD;  Location[facility] C[facility] LAB;  Service: Cardiovascular;  Laterality: N/A;  . NASAL SINUS SURGERY Right 07/11/2013   Procedure: RIGHT ENDOSCOPIC ANTERIOR ETHMOIDECTOMY/RIGHT ENDOSCOPIC MAXILLARY ANTROSTOMY WITH REMOVAL OF TISSUE;  Surgeon: Ascencion Dike, MD;  Location: [facility];  Service: ENT;  Laterality: Right;  . OTHER SURGICAL HISTORY     scar tissue removal x2  . SAVORY DILATION N/A 03/06/2014   Procedure: SAVORY DILATION;  Surgeon: Danie Binder, MD;  Location[facility];  Service: Endoscopy;  Laterality: N/A;  . TONSILLECTOMY       OB History    Gravida      Para      Term      Preterm      AB      Living  4     SAB      IAB      Ectopic      Multiple      Live Births              Family History  Problem Relation Age of Onset  . Breast cancer Mother   . Heart disease Mother   . Hypertension Mother   . Heart attack Mother   . Other Mother        varicose veins  . Cancer Mother   . Varicose Veins Mother    . Throat cancer Father   . Cancer Father   . Diabetes Brother   . Hypertension Brother   . Heart disease Brother   . Colon cancer Neg Hx     Social History   Tobacco Use  . Smoking status: Former Smoker    Packs/day: 1.00    Years: 50.00    Pack years: 50.00    Types: Cigarettes    Quit date: 12/15/2014    Years since quitting: 6.0  . Smokeless tobacco: Never Used  . Tobacco comment: smoked X 60 years  Vaping Use  . Vaping Use: Never used  Substance Use Topics  . Alcohol use: No    Alcohol/week: 0.0  standard drinks  . Drug use: No    Home Medications Prior to Admission medications   Medication Sig Start Date End Date Taking? Authorizing Provider  albuterol (PROVENTIL HFA;VENTOLIN HFA) 108 (90 BASE) MCG/ACT inhaler Inhale 2 puffs into the lungs every 4 (four) hours as needed for wheezing or shortness of breath. 08/12/13   Rexene Alberts, MD  aspirin 81 MG chewable tablet Chew 1 tablet (81 mg total) by mouth daily. 12/28/14   Reyne Dumas, MD  atorvastatin (LIPITOR) 80 MG tablet Take 1 tablet (80 mg total) by mouth daily at 6 PM. 01/10/15   Imogene Burn, PA-C  budesonide-formoterol (SYMBICORT) 80-4.5 MCG/ACT inhaler Inhale 2 puffs into the lungs 2 (two) times daily. 12/28/14   Reyne Dumas, MD  furosemide (LASIX) 40 MG tablet Take 40 mg Daily. May take extra 40 mg for weight gain above 117 lbs. 02/11/18   Arnoldo Lenis, MD  gabapentin (NEURONTIN) 300 MG capsule Take 3 capsules (900 mg total) by mouth 3 (three) times daily. 11/09/14   Melvenia Beam, MD  HYDROcodone-acetaminophen (NORCO) 10-325 MG per tablet Take 1 tablet by mouth every 6 (six) hours as needed for moderate pain or severe pain. 03/08/15   Orvan Falconer, MD  ipratropium-albuterol (DUONEB) 0.5-2.5 (3) MG/3ML SOLN Take 3 mLs by nebulization every 6 (six) hours as needed. 12/29/14   [provider]  pantoprazole (PROTONIX) 40 MG tablet Take 1 tablet (40 mg total) by mouth daily. 03/08/15   Orvan Falconer, MD   potassium chloride (K-DUR) 10 MEQ tablet Take 10 mEq by mouth daily. 01/11/15   [provider]  predniSONE (DELTASONE) 5 MG tablet Take 1 tablet (5 mg total) by mouth daily with breakfast. 03/08/15   Orvan Falconer, MD  saccharomyces boulardii (FLORASTOR) 250 MG capsule Take 250 mg by mouth 2 (two) times daily.    [provider]    Allergies    Tramadol, Mirtazapine, Codeine, Oxycodone, Talwin [pentazocine], and Valium [diazepam]  Review of Systems   Review of Systems  Constitutional: Negative for fever.  HENT: Negative for ear pain.   Eyes: Negative for pain.  Respiratory: Negative for cough.   Cardiovascular: Negative for chest pain.  Gastrointestinal: Positive for abdominal pain.  Genitourinary: Negative for flank pain.  Musculoskeletal: Negative for back pain.  Skin: Negative for rash.  Neurological: Negative for headaches.    Physical Exam Updated Vital Signs BP (!) 115/44 (BP Location: Left Arm)   Pulse 82   Temp 97.9 F (36.6 C) (Oral)   Resp 16   Ht 5\' 3"  (1.6 m)   Wt 45.8 kg   SpO2 100%   BMI 17.89 kg/m   Physical Exam Constitutional:      General: She is not in acute distress.    Appearance: Normal appearance.  HENT:     Head: Normocephalic.     Nose: Nose normal.  Eyes:     Extraocular Movements: Extraocular movements intact.  Cardiovascular:     Rate and Rhythm: Normal rate.  Pulmonary:     Effort: Pulmonary effort is normal.  Genitourinary:    Rectum: Guaiac result negative.  Musculoskeletal:        General: Normal range of motion.     Cervical back: Normal range of motion.  Neurological:     General: No focal deficit present.     Mental Status: She is alert. Mental status is at baseline.     ED Results / Procedures / Treatments   Labs (all labs  ordered are listed, but only abnormal results are displayed) Labs Reviewed  CBC WITH DIFFERENTIAL/PLATELET - Abnormal; Notable for the following components:      Result Value   WBC  14.3 (*)    RBC 3.61 (*)    Hemoglobin 10.7 (*)    HCT 35.0 (*)    Neutro Abs 10.9 (*)    Monocytes Absolute 1.7 (*)    All other components within normal limits  COMPREHENSIVE METABOLIC PANEL - Abnormal; Notable for the following components:   Glucose, Bld 113 (*)    Creatinine, Ser 1.11 (*)    Calcium 8.8 (*)    Total Protein 5.6 (*)    Albumin 2.8 (*)    GFR, Estimated 51 (*)    All other components within normal limits  LIPASE, BLOOD  POC OCCULT BLOOD, ED  TYPE AND SCREEN  ABO/RH    EKG EKG Interpretation  Date/Time:  Friday December 28 2020 20:02:48 EDT Ventricular Rate:  81 PR Interval:  183 QRS Duration: 101 QT Interval:  397 QTC Calculation: 461 R Axis:   87 Text Interpretation: Sinus rhythm Ventricular premature complex Borderline right axis deviation Minimal ST elevation, anterior leads Confirmed by Thamas Jaegers ([id number]) on 12/28/2020 8:43:11 PM   Radiology CT Abdomen Pelvis W Contrast  Result Date: 12/29/2020 CLINICAL DATA:  Abdominal pain, history of colovaginal fistula EXAM: CT ABDOMEN AND PELVIS WITH CONTRAST TECHNIQUE: Multidetector CT imaging of the abdomen and pelvis was performed using the standard protocol following bolus administration of intravenous contrast. CONTRAST:  161mL OMNIPAQUE IOHEXOL 300 MG/ML  SOLN COMPARISON:  04/06/2015 FINDINGS: Lower chest: Patchy consolidation within the right lower lobe may reflect airspace disease or hypoventilatory change. Mild right lower lobe bronchial wall thickening. Hepatobiliary: No focal liver abnormality is seen. No gallstones, gallbladder wall thickening, or biliary dilatation. Pancreas: There is diffuse pancreatic parenchymal atrophy. No focal abnormalities. Spleen: Normal in size without focal abnormality. Adrenals/Urinary Tract: Multiple bilateral peripelvic and renal cortical cysts are identified, largest in the lower pole left kidney measuring 8.4 cm. No nephrolithiasis or obstructive uropathy. The adrenals are  unremarkable. There is loss of normal fat plane between the posterior wall the bladder, anterior aspect of the vagina, and inferior aspect of the sigmoid colon. There is thickening of the posterior wall the bladder. Gas is seen communicating between the colon and the vagina consistent with known colovaginal fistula. No definite fistula between the bladder and the adjacent structures. No gas within the bladder lumen. Stomach/Bowel: No bowel obstruction or ileus. There is diffuse diverticulosis of the sigmoid colon, with underlying colovaginal fistula as described above, likely a sequela from previous bouts of diverticulitis. There are no acute inflammatory changes to suggest diverticulitis at this time. Vascular/Lymphatic: Aortic atherosclerosis. No enlarged abdominal or pelvic lymph nodes. Reproductive: The uterus is surgically absent.  No adnexal masses. Other: There is no free fluid or free gas within the peritoneal cavity. No abdominal wall hernia. Musculoskeletal: No acute or destructive bony lesions. Reconstructed images demonstrate no additional findings. IMPRESSION: 1. Loss of normal fat planes between the posterior wall of the bladder, anterior wall of the vagina, and the inferior margin of the sigmoid colon within the lower pelvis, likely as a sequela of previous bouts of diverticulitis. Gas is seen extending from the sigmoid colon into the vagina consistent with known colovaginal fistula. No definite communication with the bladder to suggest bladder fistula. 2. Sigmoid diverticulosis with no evidence of acute diverticulitis. 3. Right lower lobe bronchial wall thickening with patchy  right lower lobe consolidation, favor bronchopneumonia or inflammation. 4.  Aortic Atherosclerosis (ICD10-I70.0). Electronically Signed   By: Randa Ngo M.D.   On: 12/29/2020 00:02    Procedures Procedures   Medications Ordered in ED Medications  morphine 4 MG/ML injection 4 mg (4 mg Intravenous Given 12/28/20 2124)   ondansetron (ZOFRAN) injection 4 mg (4 mg Intravenous Given 12/28/20 2123)  iohexol (OMNIPAQUE) 300 MG/ML solution 100 mL (100 mLs Intravenous Contrast Given 12/28/20 2320)    ED Course  I have reviewed the triage vital signs and the nursing notes.  Pertinent labs & imaging results that were available during my care of the patient were reviewed by me and considered in my medical decision making (see chart for details).    MDM Rules/Calculators/A&P                          Rectal exam is benign with brown appearing stool that was guaiac negative.  Labs otherwise unremarkable mildly anemic but appears close to her normal baseline in the past.  CT of the abdomen pelvis consistent with history of diverticulosis and colovaginal fistula.  No additional acute findings noted per radiology.  Given no active rectal bleeding at this time with stable vital signs and stable hemoglobin, patient advised continued outpatient care.  Given referral to primary care and surgical clinic.  Advising immediate return for worsening symptoms or any additional concerns.  Final Clinical Impression(s) / ED Diagnoses Final diagnoses:  Rectal bleeding  Colovaginal fistula    Rx / DC Orders ED Discharge Orders    None       Luna Fuse, MD 12/29/20 [phone]

## 2020-12-29 NOTE — Plan of Care (Signed)
RN to room to discharge patient. RN explained to patient their disposition of patient being discharged. Patient inquired about reason behind being discharged as patient has concerns about going home, due to chronic condition. Patient became distraught and worried about leaving, stating, "I'm being sent home to die". Patient noted that MD did not f/u with her about plan. Patient stated that had she known about plan, arrangements would be made prior to leaving. Patient had thought that she was getting admitted. RN brought concerns up to Agricultural consultant, verified with Charge RN the plan of care that the MD did not notify with the patient. Patient in agreement to go home, but disappointed in her care. Patient really concerned about condition at this time, but ready to leave. RN educated patient on s/sx of abdominal pain, and abnormalities to look for if her need for emergent care arises.

## 2021-01-15 ENCOUNTER — Emergency Department (HOSPITAL_COMMUNITY): Payer: Medicare Other

## 2021-01-15 ENCOUNTER — Encounter (HOSPITAL_COMMUNITY): Payer: Self-pay | Admitting: Emergency Medicine

## 2021-01-15 ENCOUNTER — Other Ambulatory Visit: Payer: Self-pay

## 2021-01-15 ENCOUNTER — Emergency Department (HOSPITAL_COMMUNITY)
Admission: EM | Admit: 2021-01-15 | Discharge: 2021-01-16 | Disposition: A | Discharge status: Home / Self Care | Payer: Medicare Other | Attending: Emergency Medicine | Admitting: Emergency Medicine

## 2021-01-15 DIAGNOSIS — I2510 Atherosclerotic heart disease of native coronary artery without angina pectoris: Secondary | ICD-10-CM | POA: Diagnosis not present

## 2021-01-15 DIAGNOSIS — I252 Old myocardial infarction: Secondary | ICD-10-CM | POA: Insufficient documentation

## 2021-01-15 DIAGNOSIS — Z87891 Personal history of nicotine dependence: Secondary | ICD-10-CM | POA: Diagnosis not present

## 2021-01-15 DIAGNOSIS — R519 Headache, unspecified: Secondary | ICD-10-CM | POA: Diagnosis present

## 2021-01-15 DIAGNOSIS — I110 Hypertensive heart disease with heart failure: Secondary | ICD-10-CM | POA: Diagnosis not present

## 2021-01-15 DIAGNOSIS — Z7982 Long term (current) use of aspirin: Secondary | ICD-10-CM | POA: Insufficient documentation

## 2021-01-15 DIAGNOSIS — J45909 Unspecified asthma, uncomplicated: Secondary | ICD-10-CM | POA: Insufficient documentation

## 2021-01-15 DIAGNOSIS — Z7952 Long term (current) use of systemic steroids: Secondary | ICD-10-CM | POA: Insufficient documentation

## 2021-01-15 DIAGNOSIS — I5032 Chronic diastolic (congestive) heart failure: Secondary | ICD-10-CM | POA: Diagnosis not present

## 2021-01-15 DIAGNOSIS — J449 Chronic obstructive pulmonary disease, unspecified: Secondary | ICD-10-CM | POA: Insufficient documentation

## 2021-01-15 DIAGNOSIS — Z9861 Coronary angioplasty status: Secondary | ICD-10-CM | POA: Diagnosis not present

## 2021-01-15 DIAGNOSIS — Z79899 Other long term (current) drug therapy: Secondary | ICD-10-CM | POA: Insufficient documentation

## 2021-01-15 DIAGNOSIS — D649 Anemia, unspecified: Secondary | ICD-10-CM | POA: Diagnosis not present

## 2021-01-15 DIAGNOSIS — R1110 Vomiting, unspecified: Secondary | ICD-10-CM | POA: Diagnosis not present

## 2021-01-15 LAB — CBC WITH DIFFERENTIAL/PLATELET
Abs Immature Granulocytes: 0.04 10*3/uL (ref 0.00–0.07)
Basophils Absolute: 0.1 10*3/uL (ref 0.0–0.1)
Basophils Relative: 1 %
Eosinophils Absolute: 0.6 10*3/uL — ABNORMAL HIGH (ref 0.0–0.5)
Eosinophils Relative: 4 %
HCT: 36.4 % (ref 36.0–46.0)
Hemoglobin: 10.9 g/dL — ABNORMAL LOW (ref 12.0–15.0)
Immature Granulocytes: 0 %
Lymphocytes Relative: 10 %
Lymphs Abs: 1.6 10*3/uL (ref 0.7–4.0)
MCH: 29.3 pg (ref 26.0–34.0)
MCHC: 29.9 g/dL — ABNORMAL LOW (ref 30.0–36.0)
MCV: 97.8 fL (ref 80.0–100.0)
Monocytes Absolute: 1.7 10*3/uL — ABNORMAL HIGH (ref 0.1–1.0)
Monocytes Relative: 11 %
Neutro Abs: 11.8 10*3/uL — ABNORMAL HIGH (ref 1.7–7.7)
Neutrophils Relative %: 74 %
Platelets: 271 10*3/uL (ref 150–400)
RBC: 3.72 MIL/uL — ABNORMAL LOW (ref 3.87–5.11)
RDW: 13.8 % (ref 11.5–15.5)
WBC: 15.8 10*3/uL — ABNORMAL HIGH (ref 4.0–10.5)
nRBC: 0 % (ref 0.0–0.2)

## 2021-01-15 LAB — BASIC METABOLIC PANEL
Anion gap: 8 (ref 5–15)
BUN: 20 mg/dL (ref 8–23)
CO2: 35 mmol/L — ABNORMAL HIGH (ref 22–32)
Calcium: 9.4 mg/dL (ref 8.9–10.3)
Chloride: 98 mmol/L (ref 98–111)
Creatinine, Ser: 1.02 mg/dL — ABNORMAL HIGH (ref 0.44–1.00)
GFR, Estimated: 57 mL/min — ABNORMAL LOW (ref >60)
Glucose, Bld: 106 mg/dL — ABNORMAL HIGH (ref 70–99)
Potassium: 4.1 mmol/L (ref 3.5–5.1)
Sodium: 141 mmol/L (ref 135–145)

## 2021-01-15 MED ORDER — MORPHINE SULFATE (PF) 4 MG/ML IV SOLN
4.0000 mg | Freq: Once | INTRAVENOUS | Status: AC
  Administered 2021-01-15: 4 mg via INTRAVENOUS
  Filled 2021-01-15: qty 1

## 2021-01-15 MED ORDER — SODIUM CHLORIDE 0.9 % IV SOLN
INTRAVENOUS | Status: DC
  Administered 2021-01-15: 1 mL via INTRAVENOUS

## 2021-01-15 MED ORDER — METOCLOPRAMIDE HCL 5 MG/ML IJ SOLN
5.0000 mg | Freq: Once | INTRAMUSCULAR | Status: DC
  Filled 2021-01-15: qty 2

## 2021-01-15 MED ORDER — DEXAMETHASONE SODIUM PHOSPHATE 10 MG/ML IJ SOLN
10.0000 mg | Freq: Once | INTRAMUSCULAR | Status: AC
  Administered 2021-01-15: 10 mg via INTRAVENOUS
  Filled 2021-01-15: qty 1

## 2021-01-15 MED ORDER — DIPHENHYDRAMINE HCL 50 MG/ML IJ SOLN
12.5000 mg | Freq: Once | INTRAMUSCULAR | Status: AC
  Administered 2021-01-15: 12.5 mg via INTRAVENOUS
  Filled 2021-01-15: qty 1

## 2021-01-15 MED ORDER — METOCLOPRAMIDE HCL 5 MG/ML IJ SOLN
10.0000 mg | Freq: Once | INTRAMUSCULAR | Status: AC
  Administered 2021-01-15: 10 mg via INTRAVENOUS

## 2021-01-15 MED ORDER — IOHEXOL 350 MG/ML SOLN
80.0000 mL | Freq: Once | INTRAVENOUS | Status: AC | PRN
  Administered 2021-01-15: 80 mL via INTRAVENOUS

## 2021-01-15 MED ORDER — PROCHLORPERAZINE EDISYLATE 10 MG/2ML IJ SOLN
5.0000 mg | Freq: Once | INTRAMUSCULAR | Status: AC
  Administered 2021-01-15: 5 mg via INTRAVENOUS
  Filled 2021-01-15: qty 2

## 2021-01-15 NOTE — ED Notes (Signed)
Recheck [id number]   Delora Fuel, MD 03/55/97 [phone]

## 2021-01-15 NOTE — ED Provider Notes (Signed)
[facility] DEPT Provider Note   CSN: [id number] Arrival date & time: 01/15/21  1913     History Chief Complaint  Patient presents with  . Headache    Lindsay Mitchell is a 80 y.o. female.  80 year old female presents with headache that began 5 days ago.  Is been constant in nature and getting worse at times.  Has had some photophobia.  Denies any fever.  Pain does go down to her neck at times.  Has had emesis.  Denies any URI symptoms.  Has not been medicating at home with any medications.  Does have a prior history of migraines        Past Medical History:  Diagnosis Date  . Anxiety   . Asthma   . Carotid artery disease ([place])    a. duplex [id number]: RICA [id number], LICA 0000000. Followed by [facility].  . CHF (congestive heart failure) ([place])   . Constipation   . COPD (chronic obstructive pulmonary disease) (H[facility])    Emphysema radiographically  . Depression with anxiety   . Diverticulosis   . DVT (deep venous thrombosis) ([place])   . Fibromyalgia   . First degree AV block   . Full dentures   . Migraine   . Myocardial infarction ([place])   . On home O2   . Protein calorie malnutrition ([place])   . Pulmonary nodules 08/11/2013   a. CT angio 08/2013: 4-9mm nodules in RUL/RML, f/u recommended 6 months.  . Stroke ([place])   . TIA (transient ischemic attack)     Patient Active Problem List   Diagnosis Date Noted  . Intestinal diverticular abscess   . Adrenal insufficiency ([place]) 03/05/2015  . Orthostatic hypotension 03/04/2015  . Chronic respiratory failure with hypoxia ([place]) 03/02/2015  . Sepsis ([place]) 03/01/2015  . Diverticulitis of intestine with abscess 02/28/2015  . Fibromyalgia 02/28/2015  . Leg edema 02/28/2015  . HLD (hyperlipidemia) 02/28/2015  . Acute on chronic respiratory failure ([place]) 02/14/2015  . Diastolic CHF, chronic ([place]) 01/10/2015  . COPD, severe ([place])   . COPD exacerbation ([place])   . Essential hypertension   . Other chest pain   . Acute  renal failure syndrome ([place])   . Diverticulitis of colon   . C. difficile colitis   . Severe protein-calorie malnutrition ([place])   . Chronic pain syndrome   . Shock circulatory ([place]) 12/20/2014  . NSTEMI (non-ST elevated myocardial infarction) ([place]) 12/20/2014  . Hypomagnesemia 12/20/2014  . Diverticulitis of large intestine with abscess without bleeding   . Arterial hypotension   . Hypotension 12/19/2014  . Anemia 12/19/2014  . Elevated troponin 12/19/2014  . Hypoxia 12/19/2014  . Chest pain 12/18/2014  . Pleural effusion 12/18/2014  . Protein-calorie malnutrition, severe ([place]) 12/17/2014  . Colonic diverticular abscess 12/15/2014  . Vision loss of left eye 11/05/2014  . Trigeminal neuralgia of left side of face 11/05/2014  . 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 Paresthesias 10/31/2014  . Trigeminal neuralgia 10/31/2014  . Vision loss, left eye 10/31/2014  . Intermittent exotropia, monocular 10/31/2014  . Dyspepsia 02/15/2014  . Dysphagia, unspecified(787.20) 02/15/2014  . Pulmonary nodules 08/11/2013  . 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 Tobacco abuse 08/11/2013  . Depression with anxiety   . Rectal bleeding 06/01/2013  . Chronic back pain 04/12/2013  . COPD (chronic obstructive pulmonary disease) ([place]) 04/12/2013  . Adjustment reaction 04/12/2013  . Carotid stenosis 12/10/2012  . Constipation 10/28/2012  . Loss of weight 10/28/2012    Past Surgical History:  Procedure Laterality  Date  . ABDOMINAL HYSTERECTOMY  1971  . APPENDECTOMY    . BACK SURGERY     X3  . BIOPSY  03/06/2014   Procedure: BIOPSY;  Surgeon: Danie Binder, MD;  Location: [facility];  Service: Endoscopy;;  . COLONOSCOPY  June 2002   Dr. Ferdinand Lango: severe diverticular disease with haustral hypertrophy, primary and secondary diverticulosis, persistent spasticity, early stenosis  . COLONOSCOPY WITH PROPOFOL N/A 06/21/2013   Dr. Oneida Alar: sigmoid colon and descending colon with diverticula, small internal hemorrhoids   . ESOPHAGOGASTRODUODENOSCOPY N/A 03/06/2014   Procedure: ESOPHAGOGASTRODUODENOSCOPY (EGD);  Surgeon: Danie Binder, MD;  Location: [facility];  Service: Endoscopy;  Laterality: N/A;  9;30  . EYE SURGERY     both cataracts-lenses  . LEFT HEART CATHETERIZATION WITH CORONARY ANGIOGRAM N/A 12/21/2014   Procedure: LEFT HEART CATHETERIZATION WITH CORONARY ANGIOGRAM;  Surgeon: Belva Crome, MD;  Location: [facility] CATH LAB;  Service: Cardiovascular;  Laterality: N/A;  . NASAL SINUS SURGERY Right 07/11/2013   Procedure: RIGHT ENDOSCOPIC ANTERIOR ETHMOIDECTOMY/RIGHT ENDOSCOPIC MAXILLARY ANTROSTOMY WITH REMOVAL OF TISSUE;  Surgeon: Ascencion Dike, MD;  Location: [facility];  Service: ENT;  Laterality: Right;  . OTHER SURGICAL HISTORY     scar tissue removal x2  . SAVORY DILATION N/A 03/06/2014   Procedure: SAVORY DILATION;  Surgeon: Danie Binder, MD;  Location[facility];  Service: Endoscopy;  Laterality: N/A;  . TONSILLECTOMY       OB History    Gravida      Para      Term      Preterm      AB      Living  4     SAB      IAB      Ectopic      Multiple      Live Births              Family History  Problem Relation Age of Onset  . Breast cancer Mother   . Heart disease Mother   . Hypertension Mother   . Heart attack Mother   . Other Mother        varicose veins  . Cancer Mother   . Varicose Veins Mother   . Throat cancer Father   . Cancer Father   . Diabetes Brother   . Hypertension Brother   . Heart disease Brother   . Colon cancer Neg Hx     Social History   Tobacco Use  . Smoking status: Former Smoker    Packs/day: 1.00    Years: 50.00    Pack years: 50.00    Types: Cigarettes    Quit date: 12/15/2014    Years since quitting: 6.0  . Smokeless tobacco: Never Used  . Tobacco comment: smoked X 60 years  Vaping Use  . Vaping Use: Never used  Substance Use Topics  . Alcohol use: No    Alcohol/week: 0.0 standard drinks  . Drug use: No     Home Medications Prior to Admission medications   Medication Sig Start Date End Date Taking? Authorizing Provider  albuterol (PROVENTIL HFA;VENTOLIN HFA) 108 (90 BASE) MCG/ACT inhaler Inhale 2 puffs into the lungs every 4 (four) hours as needed for wheezing or shortness of breath. 08/12/13   Rexene Alberts, MD  albuterol (PROVENTIL) (2.5 MG/3ML) 0.083% nebulizer solution Inhale 2.5 mg into the lungs every 6 (six) hours as needed.    [provider]  aspirin  81 MG chewable tablet Chew 1 tablet (81 mg total) by mouth daily. 12/28/14   Reyne Dumas, MD  atorvastatin (LIPITOR) 80 MG tablet Take 1 tablet (80 mg total) by mouth daily at 6 PM. 01/10/15   Imogene Burn, PA-C  budesonide-formoterol (SYMBICORT) 80-4.5 MCG/ACT inhaler Inhale 2 puffs into the lungs 2 (two) times daily. 12/28/14   Reyne Dumas, MD  carvedilol (COREG) 3.125 MG tablet Take 3.125 mg by mouth in the morning and at bedtime. 06/12/20   [provider]  clotrimazole-betamethasone (LOTRISONE) cream Apply 1 application topically 2 (two) times daily as needed (rash). 06/12/20 06/12/21  [provider]  Docusate Sodium (DSS) 100 MG CAPS Take 100 mg by mouth 2 (two) times daily as needed (constipation). 06/13/20   [provider]  furosemide (LASIX) 40 MG tablet Take 40 mg Daily. May take extra 40 mg for weight gain above 117 lbs. Patient taking differently: Take 20 mg by mouth daily. 02/11/18   Arnoldo Lenis, MD  gabapentin (NEURONTIN) 300 MG capsule Take 3 capsules (900 mg total) by mouth 3 (three) times daily. Patient taking differently: Take 600 mg by mouth 3 (three) times daily. 11/09/14   Melvenia Beam, MD  HYDROcodone-acetaminophen (NORCO) 10-325 MG per tablet Take 1 tablet by mouth every 6 (six) hours as needed for moderate pain or severe pain. 03/08/15   Orvan Falconer, MD  hydroxypropyl methylcellulose / hypromellose (ISOPTO TEARS / GONIOVISC) 2.5 % ophthalmic solution Place 1 drop into  both eyes as needed for dry eyes.    [provider]  methocarbamol (ROBAXIN) 500 MG tablet Take 500 mg by mouth every 8 (eight) hours as needed for muscle spasms.    [provider]  naloxone [facility]) nasal spray 4 mg/0.1 mL Place 4 mg into the nose as needed. 11/29/19   [provider]  OXYGEN Inhale 4 L into the lungs continuous.    [provider]  pantoprazole (PROTONIX) 40 MG tablet Take 1 tablet (40 mg total) by mouth daily. 03/08/15   Orvan Falconer, MD  polyethylene glycol powder (GLYCOLAX/MIRALAX) 17 GM/SCOOP powder Take 17 g by mouth daily as needed. 10/27/16   [provider]  potassium chloride (K-DUR) 10 MEQ tablet Take 10 mEq by mouth daily. 01/11/15   [provider]  predniSONE (DELTASONE) 5 MG tablet Take 1 tablet (5 mg total) by mouth daily with breakfast. 03/08/15   Orvan Falconer, MD  saccharomyces boulardii (FLORASTOR) 250 MG capsule Take 250 mg by mouth 2 (two) times daily.    [provider]    Allergies    Tramadol, Mirtazapine, Buspar [buspirone], Codeine, Hydroxyzine, Oxycodone, Talwin [pentazocine], and Valium [diazepam]  Review of Systems   Review of Systems  All other systems reviewed and are negative.   Physical Exam Updated Vital Signs BP (!) 146/70   Pulse 90   Temp 98.9 F (37.2 C) (Oral)   Resp 18   Ht 1.6 m (5\' 3" )   Wt 45.8 kg   SpO2 98%   BMI 17.89 kg/m   Physical Exam Vitals and nursing note reviewed.  Constitutional:      General: She is not in acute distress.    Appearance: Normal appearance. She is well-developed. She is not toxic-appearing.  HENT:     Head: Normocephalic and atraumatic.  Eyes:     General: Lids are normal.     Conjunctiva/sclera: Conjunctivae normal.     Pupils: Pupils are equal, round, and reactive to light.  Neck:     Thyroid: No thyroid mass.     Trachea: No tracheal deviation.  Cardiovascular:     Rate and Rhythm: Normal rate and regular rhythm.     Heart  sounds: Normal heart sounds. No murmur heard. No gallop.   Pulmonary:     Effort: Pulmonary effort is normal. No respiratory distress.     Breath sounds: Normal breath sounds. No stridor. No decreased breath sounds, wheezing, rhonchi or rales.  Abdominal:     General: Bowel sounds are normal. There is no distension.     Palpations: Abdomen is soft.     Tenderness: There is no abdominal tenderness. There is no rebound.  Musculoskeletal:        General: No tenderness. Normal range of motion.     Cervical back: Normal range of motion and neck supple.  Skin:    General: Skin is warm and dry.     Findings: No abrasion or rash.  Neurological:     General: No focal deficit present.     Mental Status: She is alert and oriented to person, place, and time.     GCS: GCS eye subscore is 4. GCS verbal subscore is 5. GCS motor subscore is 6.     Cranial Nerves: No cranial nerve deficit.     Sensory: No sensory deficit.     Motor: Motor function is intact.     Coordination: Coordination is intact.  Psychiatric:        Speech: Speech normal.        Behavior: Behavior normal.     ED Results / Procedures / Treatments   Labs (all labs ordered are listed, but only abnormal results are displayed) Labs Reviewed  CBC WITH DIFFERENTIAL/PLATELET  BASIC METABOLIC PANEL    EKG None  Radiology No results found.  Procedures Procedures   Medications Ordered in ED Medications  0.9 %  sodium chloride infusion (has no administration in time range)  metoCLOPramide (REGLAN) injection 5 mg (has no administration in time range)    ED Course  I have reviewed the triage vital signs and the nursing notes.  Pertinent labs & imaging results that were available during my care of the patient were reviewed by me and considered in my medical decision making (see chart for details).    MDM Rules/Calculators/A&P                          Head CT negative here.  Patient medicated for pain.  CT angio  ordered to rule out subarachnoid bleed.  Will sign to next provider Final Clinical Impression(s) / ED Diagnoses Final diagnoses:  None    Rx / DC Orders ED Discharge Orders    None       Lacretia Leigh, MD 01/15/21 2227

## 2021-01-15 NOTE — ED Triage Notes (Signed)
Pt arrived via EMS from home. Pt is complaining of a headache that began 4 hours ago that radiates down her left arm and neck. Pt states the pain started 5 days ago, on and off, but was worse today when she woke up. EKG with EMS was clear. Pt had some nausea from the pain but has not had any emesis per EMS. Pt's stroke screen was negative per EMS. Pt denies any trauma or falls. Pt is on 4L nasal cannula at baseline for COPD

## 2021-01-15 NOTE — ED Provider Notes (Signed)
Care assumed from Dr. Zenia Resides, patient with headache pending CT angiogram of head, response to headache cocktail.  CT of head has been negative.  CT angiogram is normal.  Patient was sleeping following headache cocktail, but when awakened, continues to complain of severe headache.  She has no tenderness over the temporal arteries,  temporalis or frontalis muscles but significant tenderness over the insertion of the paracervical muscles.  I suspect this is a muscle contraction headache.  She will be given dose of ketorolac and methocarbamol and reassessed.  2:14 AM She got good relief of headache from above-noted treatment.  She is discharged with prescription for tizanidine, follow-up with PCP.  Recommended that she apply ice to the back of her head if headache recurs.  Results for orders placed or performed during the hospital encounter of 01/15/21  CBC with Differential/Platelet  Result Value Ref Range   WBC 15.8 (H) 4.0 - 10.5 K/uL   RBC 3.72 (L) 3.87 - 5.11 MIL/uL   Hemoglobin 10.9 (L) 12.0 - 15.0 g/dL   HCT 36.4 36.0 - 46.0 %   MCV 97.8 80.0 - 100.0 fL   MCH 29.3 26.0 - 34.0 pg   MCHC 29.9 (L) 30.0 - 36.0 g/dL   RDW 13.8 11.5 - 15.5 %   Platelets 271 150 - 400 K/uL   nRBC 0.0 0.0 - 0.2 %   Neutrophils Relative % 74 %   Neutro Abs 11.8 (H) 1.7 - 7.7 K/uL   Lymphocytes Relative 10 %   Lymphs Abs 1.6 0.7 - 4.0 K/uL   Monocytes Relative 11 %   Monocytes Absolute 1.7 (H) 0.1 - 1.0 K/uL   Eosinophils Relative 4 %   Eosinophils Absolute 0.6 (H) 0.0 - 0.5 K/uL   Basophils Relative 1 %   Basophils Absolute 0.1 0.0 - 0.1 K/uL   Immature Granulocytes 0 %   Abs Immature Granulocytes 0.04 0.00 - 0.07 K/uL  Basic metabolic panel  Result Value Ref Range   Sodium 141 135 - 145 mmol/L   Potassium 4.1 3.5 - 5.1 mmol/L   Chloride 98 98 - 111 mmol/L   CO2 35 (H) 22 - 32 mmol/L   Glucose, Bld 106 (H) 70 - 99 mg/dL   BUN 20 8 - 23 mg/dL   Creatinine, Ser 1.02 (H) 0.44 - 1.00 mg/dL   Calcium 9.4  8.9 - 10.3 mg/dL   GFR, Estimated 57 (L) >60 mL/min   Anion gap 8 5 - 15   CT Angio Head W/Cm &/Or Wo Cm  Result Date: 01/15/2021 CLINICAL DATA:  Headache EXAM: CT ANGIOGRAPHY HEAD TECHNIQUE: Multidetector CT imaging of the head was performed using the standard protocol during bolus administration of intravenous contrast. Multiplanar CT image reconstructions and MIPs were obtained to evaluate the vascular anatomy. CONTRAST:  56mL OMNIPAQUE IOHEXOL 350 MG/ML SOLN COMPARISON:  Head CT 01/15/2021 FINDINGS: POSTERIOR CIRCULATION: --Vertebral arteries: Normal --Inferior cerebellar arteries: Normal. --Basilar artery: Normal. --Superior cerebellar arteries: Normal. --Posterior cerebral arteries: Normal. ANTERIOR CIRCULATION: --Intracranial internal carotid arteries: Normal. --Anterior cerebral arteries (ACA): Normal. --Middle cerebral arteries (MCA): Normal. ANATOMIC VARIANTS: None Review of the MIP images confirms the above findings. IMPRESSION: Normal CTA of the head. Electronically Signed   By: Ulyses Jarred M.D.   On: 01/15/2021 23:27   CT Head Wo Contrast  Result Date: 01/15/2021 CLINICAL DATA:  Headache. EXAM: CT HEAD WITHOUT CONTRAST TECHNIQUE: Contiguous axial images were obtained from the base of the skull through the vertex without intravenous contrast. COMPARISON:  MR  head dated November 07, 2014 and CT head dated November 25, 2013. FINDINGS: Brain: There is mild cerebral atrophy with widening of the extra-axial spaces and ventricular dilatation. There are areas of decreased attenuation within the white matter tracts of the supratentorial brain, consistent with microvascular disease changes. Vascular: No hyperdense vessel or unexpected calcification. Skull: Normal. Negative for fracture or focal lesion. Sinuses/Orbits: No acute finding. Other: None. IMPRESSION: 1. Generalized cerebral atrophy. 2. No acute intracranial abnormality. Electronically Signed   By: Virgina Norfolk M.D.   On: 01/15/2021 21:16    CT Abdomen Pelvis W Contrast  Result Date: 12/29/2020 CLINICAL DATA:  Abdominal pain, history of colovaginal fistula EXAM: CT ABDOMEN AND PELVIS WITH CONTRAST TECHNIQUE: Multidetector CT imaging of the abdomen and pelvis was performed using the standard protocol following bolus administration of intravenous contrast. CONTRAST:  162mL OMNIPAQUE IOHEXOL 300 MG/ML  SOLN COMPARISON:  04/06/2015 FINDINGS: Lower chest: Patchy consolidation within the right lower lobe may reflect airspace disease or hypoventilatory change. Mild right lower lobe bronchial wall thickening. Hepatobiliary: No focal liver abnormality is seen. No gallstones, gallbladder wall thickening, or biliary dilatation. Pancreas: There is diffuse pancreatic parenchymal atrophy. No focal abnormalities. Spleen: Normal in size without focal abnormality. Adrenals/Urinary Tract: Multiple bilateral peripelvic and renal cortical cysts are identified, largest in the lower pole left kidney measuring 8.4 cm. No nephrolithiasis or obstructive uropathy. The adrenals are unremarkable. There is loss of normal fat plane between the posterior wall the bladder, anterior aspect of the vagina, and inferior aspect of the sigmoid colon. There is thickening of the posterior wall the bladder. Gas is seen communicating between the colon and the vagina consistent with known colovaginal fistula. No definite fistula between the bladder and the adjacent structures. No gas within the bladder lumen. Stomach/Bowel: No bowel obstruction or ileus. There is diffuse diverticulosis of the sigmoid colon, with underlying colovaginal fistula as described above, likely a sequela from previous bouts of diverticulitis. There are no acute inflammatory changes to suggest diverticulitis at this time. Vascular/Lymphatic: Aortic atherosclerosis. No enlarged abdominal or pelvic lymph nodes. Reproductive: The uterus is surgically absent.  No adnexal masses. Other: There is no free fluid or free gas  within the peritoneal cavity. No abdominal wall hernia. Musculoskeletal: No acute or destructive bony lesions. Reconstructed images demonstrate no additional findings. IMPRESSION: 1. Loss of normal fat planes between the posterior wall of the bladder, anterior wall of the vagina, and the inferior margin of the sigmoid colon within the lower pelvis, likely as a sequela of previous bouts of diverticulitis. Gas is seen extending from the sigmoid colon into the vagina consistent with known colovaginal fistula. No definite communication with the bladder to suggest bladder fistula. 2. Sigmoid diverticulosis with no evidence of acute diverticulitis. 3. Right lower lobe bronchial wall thickening with patchy right lower lobe consolidation, favor bronchopneumonia or inflammation. 4.  Aortic Atherosclerosis (ICD10-I70.0). Electronically Signed   By: Randa Ngo M.D.   On: 82/42/3536 14:43      Delora Fuel, MD 15/40/08 [phone]

## 2021-01-16 MED ORDER — METHOCARBAMOL 1000 MG/10ML IJ SOLN
1000.0000 mg | Freq: Once | INTRAVENOUS | Status: AC
  Administered 2021-01-16: 1000 mg via INTRAVENOUS
  Filled 2021-01-16: qty 1000

## 2021-01-16 MED ORDER — KETOROLAC TROMETHAMINE 15 MG/ML IJ SOLN
15.0000 mg | Freq: Once | INTRAMUSCULAR | Status: AC
  Administered 2021-01-16: 15 mg via INTRAVENOUS
  Filled 2021-01-16: qty 1

## 2021-01-16 MED ORDER — TIZANIDINE HCL 4 MG PO TABS: 4.0000 mg | ORAL_TABLET | Freq: Four times a day (QID) | ORAL | 0 refills | Status: AC | PRN

## 2021-01-16 NOTE — ED Notes (Signed)
[facility] called to verify transport called.

## 2021-01-16 NOTE — Discharge Instructions (Addendum)
Apply ice to the back of your neck for 20-30 minutes at a time as needed.  You may take ibuprofen and/or acetaminophen as needed for pain.  Return to the emergency department if symptoms are getting worse.

## 2021-01-16 NOTE — ED Notes (Addendum)
Pt cont sleeping in stretcher, pending transportation home at this time. NAD

## 2022-01-30 DEATH — deceased

## 2023-05-12 ENCOUNTER — Encounter: Payer: Self-pay | Admitting: *Deleted
